# Patient Record
Sex: Female | Born: 1950
Health system: Southern US, Community
[De-identification: ages and names within clinical notes are randomized; demographics above are authoritative.]

## PROBLEM LIST (undated history)

## (undated) DIAGNOSIS — M1990 Unspecified osteoarthritis, unspecified site: Secondary | ICD-10-CM

## (undated) DIAGNOSIS — Z923 Personal history of irradiation: Secondary | ICD-10-CM

## (undated) DIAGNOSIS — F909 Attention-deficit hyperactivity disorder, unspecified type: Secondary | ICD-10-CM

## (undated) DIAGNOSIS — T8859XA Other complications of anesthesia, initial encounter: Secondary | ICD-10-CM

## (undated) DIAGNOSIS — Z85048 Personal history of other malignant neoplasm of rectum, rectosigmoid junction, and anus: Secondary | ICD-10-CM

## (undated) DIAGNOSIS — R0600 Dyspnea, unspecified: Secondary | ICD-10-CM

## (undated) DIAGNOSIS — D649 Anemia, unspecified: Secondary | ICD-10-CM

## (undated) DIAGNOSIS — I10 Essential (primary) hypertension: Secondary | ICD-10-CM

## (undated) DIAGNOSIS — F32A Depression, unspecified: Secondary | ICD-10-CM

## (undated) DIAGNOSIS — C20 Malignant neoplasm of rectum: Secondary | ICD-10-CM

## (undated) DIAGNOSIS — D126 Benign neoplasm of colon, unspecified: Secondary | ICD-10-CM

## (undated) DIAGNOSIS — H269 Unspecified cataract: Secondary | ICD-10-CM

## (undated) DIAGNOSIS — R222 Localized swelling, mass and lump, trunk: Secondary | ICD-10-CM

## (undated) DIAGNOSIS — N3281 Overactive bladder: Secondary | ICD-10-CM

## (undated) DIAGNOSIS — F329 Major depressive disorder, single episode, unspecified: Secondary | ICD-10-CM

## (undated) DIAGNOSIS — F419 Anxiety disorder, unspecified: Secondary | ICD-10-CM

## (undated) DIAGNOSIS — T4145XA Adverse effect of unspecified anesthetic, initial encounter: Secondary | ICD-10-CM

## (undated) DIAGNOSIS — C801 Malignant (primary) neoplasm, unspecified: Secondary | ICD-10-CM

## (undated) DIAGNOSIS — R011 Cardiac murmur, unspecified: Secondary | ICD-10-CM

## (undated) HISTORY — PX: WISDOM TOOTH EXTRACTION: SHX21

## (undated) HISTORY — PX: ABDOMINAL HYSTERECTOMY: SHX81

## (undated) HISTORY — DX: Anemia, unspecified: D64.9

## (undated) HISTORY — PX: RECTAL SURGERY: SHX760

## (undated) HISTORY — DX: Malignant neoplasm of rectum: C20

## (undated) HISTORY — DX: Benign neoplasm of colon, unspecified: D12.6

## (undated) HISTORY — DX: Unspecified cataract: H26.9

## (undated) HISTORY — PX: PORTACATH PLACEMENT: SHX2246

## (undated) HISTORY — DX: Major depressive disorder, single episode, unspecified: F32.9

## (undated) HISTORY — PX: EYE SURGERY: SHX253

## (undated) HISTORY — DX: Personal history of other malignant neoplasm of rectum, rectosigmoid junction, and anus: Z85.048

## (undated) HISTORY — DX: Localized swelling, mass and lump, trunk: R22.2

## (undated) HISTORY — DX: Depression, unspecified: F32.A

## (undated) HISTORY — PX: HAND SURGERY: SHX662

## (undated) HISTORY — DX: Anxiety disorder, unspecified: F41.9

## (undated) HISTORY — DX: Cardiac murmur, unspecified: R01.1

---

## 1978-12-03 HISTORY — PX: HEMORRHOID SURGERY: SHX153

## 1998-09-25 ENCOUNTER — Ambulatory Visit (HOSPITAL_COMMUNITY): Admission: RE | Admit: 1998-09-25 | Discharge: 1998-09-25 | Payer: Self-pay | Admitting: Family Medicine

## 1998-09-25 ENCOUNTER — Encounter: Payer: Self-pay | Admitting: Family Medicine

## 1999-03-29 ENCOUNTER — Other Ambulatory Visit: Admission: RE | Admit: 1999-03-29 | Discharge: 1999-03-29 | Payer: Self-pay | Admitting: Family Medicine

## 2000-10-10 ENCOUNTER — Other Ambulatory Visit: Admission: RE | Admit: 2000-10-10 | Discharge: 2000-10-10 | Payer: Self-pay | Admitting: Family Medicine

## 2001-05-06 ENCOUNTER — Ambulatory Visit (HOSPITAL_BASED_OUTPATIENT_CLINIC_OR_DEPARTMENT_OTHER): Admission: RE | Admit: 2001-05-06 | Discharge: 2001-05-06 | Payer: Self-pay | Admitting: Orthopedic Surgery

## 2001-11-17 ENCOUNTER — Other Ambulatory Visit: Admission: RE | Admit: 2001-11-17 | Discharge: 2001-11-17 | Payer: Self-pay | Admitting: Family Medicine

## 2001-12-02 ENCOUNTER — Encounter: Payer: Self-pay | Admitting: Family Medicine

## 2001-12-02 ENCOUNTER — Ambulatory Visit (HOSPITAL_COMMUNITY): Admission: RE | Admit: 2001-12-02 | Discharge: 2001-12-02 | Payer: Self-pay | Admitting: Family Medicine

## 2002-12-01 ENCOUNTER — Other Ambulatory Visit: Admission: RE | Admit: 2002-12-01 | Discharge: 2002-12-01 | Payer: Self-pay | Admitting: Family Medicine

## 2004-04-12 ENCOUNTER — Other Ambulatory Visit: Admission: RE | Admit: 2004-04-12 | Discharge: 2004-04-12 | Payer: Self-pay | Admitting: Family Medicine

## 2004-04-27 ENCOUNTER — Encounter: Admission: RE | Admit: 2004-04-27 | Discharge: 2004-04-27 | Payer: Self-pay | Admitting: Family Medicine

## 2004-05-16 ENCOUNTER — Ambulatory Visit (HOSPITAL_COMMUNITY): Admission: RE | Admit: 2004-05-16 | Discharge: 2004-05-16 | Payer: Self-pay | Admitting: Internal Medicine

## 2004-07-14 ENCOUNTER — Ambulatory Visit (HOSPITAL_COMMUNITY): Admission: RE | Admit: 2004-07-14 | Discharge: 2004-07-14 | Payer: Self-pay | Admitting: Family Medicine

## 2005-06-11 ENCOUNTER — Other Ambulatory Visit: Admission: RE | Admit: 2005-06-11 | Discharge: 2005-06-11 | Payer: Self-pay | Admitting: Family Medicine

## 2006-02-13 ENCOUNTER — Ambulatory Visit (HOSPITAL_COMMUNITY): Admission: RE | Admit: 2006-02-13 | Discharge: 2006-02-13 | Payer: Self-pay | Admitting: Family Medicine

## 2008-06-30 ENCOUNTER — Other Ambulatory Visit: Admission: RE | Admit: 2008-06-30 | Discharge: 2008-06-30 | Payer: Self-pay | Admitting: Family Medicine

## 2009-11-18 ENCOUNTER — Ambulatory Visit (HOSPITAL_COMMUNITY): Admission: RE | Admit: 2009-11-18 | Discharge: 2009-11-18 | Payer: Self-pay | Admitting: Gastroenterology

## 2009-11-30 ENCOUNTER — Ambulatory Visit: Payer: Self-pay | Admitting: Hematology and Oncology

## 2009-12-08 ENCOUNTER — Ambulatory Visit (HOSPITAL_COMMUNITY): Admission: RE | Admit: 2009-12-08 | Discharge: 2009-12-08 | Payer: Self-pay | Admitting: Hematology and Oncology

## 2009-12-08 LAB — COMPREHENSIVE METABOLIC PANEL
AST: 16 U/L (ref 0–37)
Albumin: 4.4 g/dL (ref 3.5–5.2)
CO2: 27 mEq/L (ref 19–32)
Chloride: 102 mEq/L (ref 96–112)
Creatinine, Ser: 0.97 mg/dL (ref 0.40–1.20)
Potassium: 4.6 mEq/L (ref 3.5–5.3)
Sodium: 141 mEq/L (ref 135–145)

## 2009-12-08 LAB — CBC WITH DIFFERENTIAL/PLATELET
BASO%: 1.5 % (ref 0.0–2.0)
Basophils Absolute: 0.1 10*3/uL (ref 0.0–0.1)
EOS%: 3.8 % (ref 0.0–7.0)
Eosinophils Absolute: 0.2 10*3/uL (ref 0.0–0.5)
HGB: 11.9 g/dL (ref 11.6–15.9)
LYMPH%: 41.7 % (ref 14.0–49.7)
MCHC: 31.7 g/dL (ref 31.5–36.0)
NEUT%: 42.9 % (ref 38.4–76.8)
lymph#: 2.3 10*3/uL (ref 0.9–3.3)

## 2009-12-08 LAB — CEA: CEA: 2.6 ng/mL (ref 0.0–5.0)

## 2009-12-13 ENCOUNTER — Ambulatory Visit: Admission: RE | Admit: 2009-12-13 | Discharge: 2010-02-01 | Payer: Self-pay | Admitting: Radiation Oncology

## 2009-12-14 ENCOUNTER — Ambulatory Visit (HOSPITAL_COMMUNITY): Admission: RE | Admit: 2009-12-14 | Discharge: 2009-12-14 | Payer: Self-pay | Admitting: Hematology and Oncology

## 2009-12-15 ENCOUNTER — Ambulatory Visit (HOSPITAL_COMMUNITY): Admission: RE | Admit: 2009-12-15 | Discharge: 2009-12-15 | Payer: Self-pay | Admitting: Hematology and Oncology

## 2009-12-23 ENCOUNTER — Ambulatory Visit (HOSPITAL_BASED_OUTPATIENT_CLINIC_OR_DEPARTMENT_OTHER): Admission: RE | Admit: 2009-12-23 | Discharge: 2009-12-23 | Payer: Self-pay | Admitting: General Surgery

## 2009-12-26 LAB — CBC WITH DIFFERENTIAL/PLATELET
Eosinophils Absolute: 0.1 10*3/uL (ref 0.0–0.5)
HGB: 11.8 g/dL (ref 11.6–15.9)
LYMPH%: 27.8 % (ref 14.0–49.7)
MCV: 81.4 fL (ref 79.5–101.0)
MONO%: 8.5 % (ref 0.0–14.0)
NEUT#: 3.9 10*3/uL (ref 1.5–6.5)
NEUT%: 61 % (ref 38.4–76.8)
Platelets: 296 10*3/uL (ref 145–400)
WBC: 6.4 10*3/uL (ref 3.9–10.3)

## 2009-12-29 ENCOUNTER — Ambulatory Visit: Payer: Self-pay | Admitting: Hematology and Oncology

## 2010-01-02 LAB — CBC WITH DIFFERENTIAL/PLATELET
Basophils Absolute: 0 10*3/uL (ref 0.0–0.1)
EOS%: 4.5 % (ref 0.0–7.0)
HGB: 12 g/dL (ref 11.6–15.9)
LYMPH%: 31.5 % (ref 14.0–49.7)
MCHC: 32.1 g/dL (ref 31.5–36.0)
MCV: 80.4 fL (ref 79.5–101.0)
MONO%: 12.1 % (ref 0.0–14.0)
NEUT#: 1.8 10*3/uL (ref 1.5–6.5)
NEUT%: 51.1 % (ref 38.4–76.8)
Platelets: 282 10*3/uL (ref 145–400)
RBC: 4.65 10*6/uL (ref 3.70–5.45)
RDW: 15.6 % — ABNORMAL HIGH (ref 11.2–14.5)
WBC: 3.6 10*3/uL — ABNORMAL LOW (ref 3.9–10.3)

## 2010-01-06 LAB — CBC WITH DIFFERENTIAL/PLATELET
EOS%: 3.7 % (ref 0.0–7.0)
MONO#: 0.3 10*3/uL (ref 0.1–0.9)
MONO%: 9.1 % (ref 0.0–14.0)
NEUT#: 2.4 10*3/uL (ref 1.5–6.5)
NEUT%: 67.1 % (ref 38.4–76.8)
RDW: 16.2 % — ABNORMAL HIGH (ref 11.2–14.5)
WBC: 3.6 10*3/uL — ABNORMAL LOW (ref 3.9–10.3)

## 2010-01-06 LAB — COMPREHENSIVE METABOLIC PANEL
ALT: 12 U/L (ref 0–35)
AST: 13 U/L (ref 0–37)
Albumin: 4.3 g/dL (ref 3.5–5.2)
CO2: 24 mEq/L (ref 19–32)
Calcium: 9.5 mg/dL (ref 8.4–10.5)
Sodium: 138 mEq/L (ref 135–145)
Total Protein: 7.4 g/dL (ref 6.0–8.3)

## 2010-01-06 LAB — LACTATE DEHYDROGENASE: LDH: 159 U/L (ref 94–250)

## 2010-01-08 ENCOUNTER — Emergency Department (HOSPITAL_COMMUNITY): Admission: EM | Admit: 2010-01-08 | Discharge: 2010-01-08 | Payer: Self-pay | Admitting: Emergency Medicine

## 2010-01-09 LAB — CBC WITH DIFFERENTIAL/PLATELET
BASO%: 0.2 % (ref 0.0–2.0)
EOS%: 5 % (ref 0.0–7.0)
LYMPH%: 18.2 % (ref 14.0–49.7)
MCH: 27.2 pg (ref 25.1–34.0)
MCHC: 34 g/dL (ref 31.5–36.0)
MONO#: 0.3 10*3/uL (ref 0.1–0.9)
NEUT%: 69 % (ref 38.4–76.8)
Platelets: 230 10*3/uL (ref 145–400)
RBC: 4.17 10*6/uL (ref 3.70–5.45)
WBC: 3.4 10*3/uL — ABNORMAL LOW (ref 3.9–10.3)
lymph#: 0.6 10*3/uL — ABNORMAL LOW (ref 0.9–3.3)

## 2010-01-16 LAB — CBC WITH DIFFERENTIAL/PLATELET
BASO%: 0.5 % (ref 0.0–2.0)
EOS%: 6.1 % (ref 0.0–7.0)
HCT: 35.2 % (ref 34.8–46.6)
MCH: 26.1 pg (ref 25.1–34.0)
MCHC: 32.4 g/dL (ref 31.5–36.0)
MONO#: 0.4 10*3/uL (ref 0.1–0.9)
NEUT%: 64.9 % (ref 38.4–76.8)
RDW: 16.3 % — ABNORMAL HIGH (ref 11.2–14.5)
WBC: 3.8 10*3/uL — ABNORMAL LOW (ref 3.9–10.3)
lymph#: 0.7 10*3/uL — ABNORMAL LOW (ref 0.9–3.3)
nRBC: 0 % (ref 0–0)

## 2010-01-23 LAB — CBC WITH DIFFERENTIAL/PLATELET
Basophils Absolute: 0 10*3/uL (ref 0.0–0.1)
Eosinophils Absolute: 0.3 10*3/uL (ref 0.0–0.5)
HGB: 11.1 g/dL — ABNORMAL LOW (ref 11.6–15.9)
MCV: 80.2 fL (ref 79.5–101.0)
MONO#: 0.4 10*3/uL (ref 0.1–0.9)
MONO%: 12.8 % (ref 0.0–14.0)
NEUT#: 2 10*3/uL (ref 1.5–6.5)
RBC: 4.12 10*6/uL (ref 3.70–5.45)
RDW: 17.1 % — ABNORMAL HIGH (ref 11.2–14.5)
WBC: 3.2 10*3/uL — ABNORMAL LOW (ref 3.9–10.3)
lymph#: 0.4 10*3/uL — ABNORMAL LOW (ref 0.9–3.3)

## 2010-01-26 ENCOUNTER — Ambulatory Visit: Payer: Self-pay | Admitting: Hematology and Oncology

## 2010-01-30 LAB — CBC WITH DIFFERENTIAL/PLATELET
Basophils Absolute: 0.1 10*3/uL (ref 0.0–0.1)
Eosinophils Absolute: 0.3 10*3/uL (ref 0.0–0.5)
HGB: 10.9 g/dL — ABNORMAL LOW (ref 11.6–15.9)
LYMPH%: 14.1 % (ref 14.0–49.7)
MCV: 80.9 fL (ref 79.5–101.0)
MONO#: 0.4 10*3/uL (ref 0.1–0.9)
MONO%: 9.8 % (ref 0.0–14.0)
NEUT#: 3 10*3/uL (ref 1.5–6.5)
Platelets: 256 10*3/uL (ref 145–400)
RBC: 4.19 10*6/uL (ref 3.70–5.45)
WBC: 4.4 10*3/uL (ref 3.9–10.3)
nRBC: 0 % (ref 0–0)

## 2010-02-22 ENCOUNTER — Ambulatory Visit: Payer: Self-pay | Admitting: Hematology and Oncology

## 2010-02-24 ENCOUNTER — Ambulatory Visit (HOSPITAL_COMMUNITY): Admission: RE | Admit: 2010-02-24 | Discharge: 2010-02-24 | Payer: Self-pay | Admitting: Hematology and Oncology

## 2010-02-24 LAB — COMPREHENSIVE METABOLIC PANEL
ALT: 45 U/L — ABNORMAL HIGH (ref 0–35)
Albumin: 4.1 g/dL (ref 3.5–5.2)
Alkaline Phosphatase: 77 U/L (ref 39–117)
CO2: 29 mEq/L (ref 19–32)
Calcium: 9.6 mg/dL (ref 8.4–10.5)
Chloride: 103 mEq/L (ref 96–112)
Glucose, Bld: 105 mg/dL — ABNORMAL HIGH (ref 70–99)
Sodium: 140 mEq/L (ref 135–145)
Total Bilirubin: 0.5 mg/dL (ref 0.3–1.2)
Total Protein: 7.7 g/dL (ref 6.0–8.3)

## 2010-02-24 LAB — CBC WITH DIFFERENTIAL/PLATELET
BASO%: 0.5 % (ref 0.0–2.0)
Basophils Absolute: 0 10*3/uL (ref 0.0–0.1)
EOS%: 5.6 % (ref 0.0–7.0)
HCT: 33.8 % — ABNORMAL LOW (ref 34.8–46.6)
LYMPH%: 17.8 % (ref 14.0–49.7)
MCHC: 33.6 g/dL (ref 31.5–36.0)
MONO#: 0.5 10*3/uL (ref 0.1–0.9)
MONO%: 15.7 % — ABNORMAL HIGH (ref 0.0–14.0)
NEUT%: 60.4 % (ref 38.4–76.8)

## 2010-02-24 LAB — LACTATE DEHYDROGENASE: LDH: 167 U/L (ref 94–250)

## 2010-02-24 LAB — CEA: CEA: 1.3 ng/mL (ref 0.0–5.0)

## 2010-04-04 ENCOUNTER — Ambulatory Visit (HOSPITAL_COMMUNITY): Admission: RE | Admit: 2010-04-04 | Discharge: 2010-04-04 | Payer: Self-pay | Admitting: Hematology and Oncology

## 2010-04-24 ENCOUNTER — Encounter (INDEPENDENT_AMBULATORY_CARE_PROVIDER_SITE_OTHER): Payer: Self-pay | Admitting: General Surgery

## 2010-04-24 ENCOUNTER — Inpatient Hospital Stay (HOSPITAL_COMMUNITY): Admission: RE | Admit: 2010-04-24 | Discharge: 2010-04-29 | Payer: Self-pay | Admitting: General Surgery

## 2010-04-24 HISTORY — PX: COLON SURGERY: SHX602

## 2010-05-12 ENCOUNTER — Ambulatory Visit: Payer: Self-pay | Admitting: Hematology and Oncology

## 2010-06-28 ENCOUNTER — Ambulatory Visit: Payer: Self-pay | Admitting: Hematology and Oncology

## 2010-08-08 ENCOUNTER — Ambulatory Visit: Payer: Self-pay | Admitting: Hematology and Oncology

## 2010-08-30 LAB — COMPREHENSIVE METABOLIC PANEL
ALT: 11 U/L (ref 0–35)
Albumin: 4.1 g/dL (ref 3.5–5.2)
BUN: 12 mg/dL (ref 6–23)
Calcium: 9.5 mg/dL (ref 8.4–10.5)
Creatinine, Ser: 1.03 mg/dL (ref 0.40–1.20)
Glucose, Bld: 175 mg/dL — ABNORMAL HIGH (ref 70–99)
Sodium: 140 mEq/L (ref 135–145)
Total Bilirubin: 0.2 mg/dL — ABNORMAL LOW (ref 0.3–1.2)
Total Protein: 7.1 g/dL (ref 6.0–8.3)

## 2010-08-30 LAB — LACTATE DEHYDROGENASE: LDH: 110 U/L (ref 94–250)

## 2010-08-30 LAB — CBC WITH DIFFERENTIAL/PLATELET
BASO%: 0.2 % (ref 0.0–2.0)
Basophils Absolute: 0 10*3/uL (ref 0.0–0.1)
EOS%: 2.2 % (ref 0.0–7.0)
HCT: 32.7 % — ABNORMAL LOW (ref 34.8–46.6)
MCH: 26.1 pg (ref 25.1–34.0)
MCHC: 33.8 g/dL (ref 31.5–36.0)
MCV: 77.3 fL — ABNORMAL LOW (ref 79.5–101.0)
MONO%: 8.8 % (ref 0.0–14.0)
Platelets: 302 10*3/uL (ref 145–400)
RBC: 4.23 10*6/uL (ref 3.70–5.45)
lymph#: 0.7 10*3/uL — ABNORMAL LOW (ref 0.9–3.3)

## 2010-09-04 ENCOUNTER — Ambulatory Visit (HOSPITAL_COMMUNITY): Admission: RE | Admit: 2010-09-04 | Discharge: 2010-09-04 | Payer: Self-pay | Admitting: Hematology and Oncology

## 2010-09-20 ENCOUNTER — Ambulatory Visit: Payer: Self-pay | Admitting: Hematology and Oncology

## 2010-09-22 LAB — CBC WITH DIFFERENTIAL/PLATELET
BASO%: 1.4 % (ref 0.0–2.0)
Basophils Absolute: 0 10*3/uL (ref 0.0–0.1)
EOS%: 2.9 % (ref 0.0–7.0)
Eosinophils Absolute: 0.1 10*3/uL (ref 0.0–0.5)
HCT: 33.7 % — ABNORMAL LOW (ref 34.8–46.6)
HGB: 11.1 g/dL — ABNORMAL LOW (ref 11.6–15.9)
LYMPH%: 22.4 % (ref 14.0–49.7)
MCH: 25.8 pg (ref 25.1–34.0)
MCHC: 33 g/dL (ref 31.5–36.0)
MCV: 78.2 fL — ABNORMAL LOW (ref 79.5–101.0)
MONO#: 0.3 10*3/uL (ref 0.1–0.9)
MONO%: 10.7 % (ref 0.0–14.0)
NEUT#: 1.9 10*3/uL (ref 1.5–6.5)
NEUT%: 62.6 % (ref 38.4–76.8)
Platelets: 321 10*3/uL (ref 145–400)
RBC: 4.31 10*6/uL (ref 3.70–5.45)
RDW: 20 % — ABNORMAL HIGH (ref 11.2–14.5)
WBC: 3.1 10*3/uL — ABNORMAL LOW (ref 3.9–10.3)
lymph#: 0.7 10*3/uL — ABNORMAL LOW (ref 0.9–3.3)

## 2010-09-22 LAB — COMPREHENSIVE METABOLIC PANEL
ALT: 10 U/L (ref 0–35)
AST: 10 U/L (ref 0–37)
Albumin: 4.2 g/dL (ref 3.5–5.2)
Alkaline Phosphatase: 88 U/L (ref 39–117)
BUN: 16 mg/dL (ref 6–23)
CO2: 27 mEq/L (ref 19–32)
Calcium: 9.6 mg/dL (ref 8.4–10.5)
Chloride: 104 mEq/L (ref 96–112)
Creatinine, Ser: 1.11 mg/dL (ref 0.40–1.20)
Glucose, Bld: 144 mg/dL — ABNORMAL HIGH (ref 70–99)
Potassium: 3.8 mEq/L (ref 3.5–5.3)
Sodium: 142 mEq/L (ref 135–145)
Total Bilirubin: 0.3 mg/dL (ref 0.3–1.2)
Total Protein: 7.1 g/dL (ref 6.0–8.3)

## 2010-10-19 LAB — COMPREHENSIVE METABOLIC PANEL
BUN: 22 mg/dL (ref 6–23)
CO2: 26 mEq/L (ref 19–32)
Creatinine, Ser: 1.16 mg/dL (ref 0.40–1.20)
Glucose, Bld: 138 mg/dL — ABNORMAL HIGH (ref 70–99)
Total Bilirubin: 0.2 mg/dL — ABNORMAL LOW (ref 0.3–1.2)

## 2010-10-19 LAB — CBC WITH DIFFERENTIAL/PLATELET
Eosinophils Absolute: 0.1 10*3/uL (ref 0.0–0.5)
HCT: 33.9 % — ABNORMAL LOW (ref 34.8–46.6)
LYMPH%: 22.7 % (ref 14.0–49.7)
MCHC: 32.9 g/dL (ref 31.5–36.0)
MONO#: 0.3 10*3/uL (ref 0.1–0.9)
NEUT#: 2.6 10*3/uL (ref 1.5–6.5)
NEUT%: 66.2 % (ref 38.4–76.8)
Platelets: 333 10*3/uL (ref 145–400)
WBC: 4 10*3/uL (ref 3.9–10.3)

## 2010-10-23 ENCOUNTER — Ambulatory Visit: Payer: Self-pay | Admitting: Hematology and Oncology

## 2010-10-25 LAB — CBC WITH DIFFERENTIAL/PLATELET
Eosinophils Absolute: 0.1 10*3/uL (ref 0.0–0.5)
HCT: 31.8 % — ABNORMAL LOW (ref 34.8–46.6)
LYMPH%: 23.3 % (ref 14.0–49.7)
MONO#: 0.3 10*3/uL (ref 0.1–0.9)
NEUT#: 1.8 10*3/uL (ref 1.5–6.5)
NEUT%: 61.8 % (ref 38.4–76.8)
Platelets: 282 10*3/uL (ref 145–400)
WBC: 2.9 10*3/uL — ABNORMAL LOW (ref 3.9–10.3)

## 2010-11-14 LAB — CBC WITH DIFFERENTIAL/PLATELET
Basophils Absolute: 0 10*3/uL (ref 0.0–0.1)
HCT: 34.8 % (ref 34.8–46.6)
HGB: 11.7 g/dL (ref 11.6–15.9)
LYMPH%: 22.3 % (ref 14.0–49.7)
MCH: 26.6 pg (ref 25.1–34.0)
MONO#: 0.3 10*3/uL (ref 0.1–0.9)
NEUT%: 66.2 % (ref 38.4–76.8)
Platelets: 339 10*3/uL (ref 145–400)
WBC: 3.9 10*3/uL (ref 3.9–10.3)
lymph#: 0.9 10*3/uL (ref 0.9–3.3)

## 2010-11-14 LAB — COMPREHENSIVE METABOLIC PANEL
BUN: 14 mg/dL (ref 6–23)
CO2: 25 mEq/L (ref 19–32)
Calcium: 9.7 mg/dL (ref 8.4–10.5)
Chloride: 103 mEq/L (ref 96–112)
Creatinine, Ser: 1.07 mg/dL (ref 0.40–1.20)
Total Bilirubin: 0.3 mg/dL (ref 0.3–1.2)

## 2010-11-22 ENCOUNTER — Ambulatory Visit: Payer: Self-pay | Admitting: Hematology and Oncology

## 2010-11-23 LAB — CBC WITH DIFFERENTIAL/PLATELET
BASO%: 1.9 % (ref 0.0–2.0)
Basophils Absolute: 0.1 10*3/uL (ref 0.0–0.1)
Eosinophils Absolute: 0.1 10*3/uL (ref 0.0–0.5)
HCT: 34.4 % — ABNORMAL LOW (ref 34.8–46.6)
HGB: 11 g/dL — ABNORMAL LOW (ref 11.6–15.9)
MONO#: 0.3 10*3/uL (ref 0.1–0.9)
NEUT#: 1.2 10*3/uL — ABNORMAL LOW (ref 1.5–6.5)
NEUT%: 46.5 % (ref 38.4–76.8)
WBC: 2.6 10*3/uL — ABNORMAL LOW (ref 3.9–10.3)
lymph#: 0.9 10*3/uL (ref 0.9–3.3)

## 2010-11-28 LAB — CBC WITH DIFFERENTIAL/PLATELET
Basophils Absolute: 0 10*3/uL (ref 0.0–0.1)
Eosinophils Absolute: 0.1 10*3/uL (ref 0.0–0.5)
HCT: 36 % (ref 34.8–46.6)
HGB: 11.3 g/dL — ABNORMAL LOW (ref 11.6–15.9)
MCV: 80.9 fL (ref 79.5–101.0)
MONO%: 8.2 % (ref 0.0–14.0)
NEUT#: 2.6 10*3/uL (ref 1.5–6.5)
RDW: 17.4 % — ABNORMAL HIGH (ref 11.2–14.5)

## 2010-12-19 LAB — COMPREHENSIVE METABOLIC PANEL
ALT: 13 U/L (ref 0–35)
AST: 13 U/L (ref 0–37)
Albumin: 4.3 g/dL (ref 3.5–5.2)
Alkaline Phosphatase: 68 U/L (ref 39–117)
BUN: 11 mg/dL (ref 6–23)
CO2: 28 mEq/L (ref 19–32)
Calcium: 9.5 mg/dL (ref 8.4–10.5)
Chloride: 104 mEq/L (ref 96–112)
Creatinine, Ser: 0.99 mg/dL (ref 0.40–1.20)
Glucose, Bld: 131 mg/dL — ABNORMAL HIGH (ref 70–99)
Potassium: 4 mEq/L (ref 3.5–5.3)
Sodium: 141 mEq/L (ref 135–145)
Total Bilirubin: 0.3 mg/dL (ref 0.3–1.2)
Total Protein: 6.9 g/dL (ref 6.0–8.3)

## 2010-12-19 LAB — CBC WITH DIFFERENTIAL/PLATELET
BASO%: 0.6 % (ref 0.0–2.0)
Basophils Absolute: 0 10*3/uL (ref 0.0–0.1)
EOS%: 3 % (ref 0.0–7.0)
Eosinophils Absolute: 0.1 10*3/uL (ref 0.0–0.5)
HCT: 33.3 % — ABNORMAL LOW (ref 34.8–46.6)
HGB: 10.9 g/dL — ABNORMAL LOW (ref 11.6–15.9)
LYMPH%: 31.3 % (ref 14.0–49.7)
MCH: 26.3 pg (ref 25.1–34.0)
MCHC: 32.8 g/dL (ref 31.5–36.0)
MCV: 80.3 fL (ref 79.5–101.0)
MONO#: 0.3 10*3/uL (ref 0.1–0.9)
MONO%: 10.6 % (ref 0.0–14.0)
NEUT#: 1.5 10*3/uL (ref 1.5–6.5)
NEUT%: 54.5 % (ref 38.4–76.8)
Platelets: 267 10*3/uL (ref 145–400)
RBC: 4.15 10*6/uL (ref 3.70–5.45)
RDW: 18.7 % — ABNORMAL HIGH (ref 11.2–14.5)
WBC: 2.8 10*3/uL — ABNORMAL LOW (ref 3.9–10.3)
lymph#: 0.9 10*3/uL (ref 0.9–3.3)

## 2010-12-22 ENCOUNTER — Other Ambulatory Visit: Payer: Self-pay | Admitting: Hematology and Oncology

## 2010-12-22 DIAGNOSIS — C20 Malignant neoplasm of rectum: Secondary | ICD-10-CM

## 2010-12-24 ENCOUNTER — Encounter: Payer: Self-pay | Admitting: Family Medicine

## 2010-12-25 ENCOUNTER — Ambulatory Visit: Payer: Self-pay | Admitting: Hematology and Oncology

## 2011-01-23 ENCOUNTER — Other Ambulatory Visit (HOSPITAL_COMMUNITY): Payer: Self-pay

## 2011-01-23 ENCOUNTER — Encounter (HOSPITAL_BASED_OUTPATIENT_CLINIC_OR_DEPARTMENT_OTHER): Payer: PRIVATE HEALTH INSURANCE | Admitting: Hematology and Oncology

## 2011-01-23 ENCOUNTER — Other Ambulatory Visit: Payer: Self-pay | Admitting: Hematology and Oncology

## 2011-01-23 ENCOUNTER — Ambulatory Visit (HOSPITAL_COMMUNITY)
Admission: RE | Admit: 2011-01-23 | Discharge: 2011-01-23 | Disposition: A | Discharge status: Home / Self Care | Payer: PRIVATE HEALTH INSURANCE | Source: Ambulatory Visit | Attending: Hematology and Oncology | Admitting: Hematology and Oncology

## 2011-01-23 DIAGNOSIS — K7689 Other specified diseases of liver: Secondary | ICD-10-CM | POA: Insufficient documentation

## 2011-01-23 DIAGNOSIS — C20 Malignant neoplasm of rectum: Secondary | ICD-10-CM | POA: Insufficient documentation

## 2011-01-23 DIAGNOSIS — J984 Other disorders of lung: Secondary | ICD-10-CM | POA: Insufficient documentation

## 2011-01-23 DIAGNOSIS — Z5111 Encounter for antineoplastic chemotherapy: Secondary | ICD-10-CM

## 2011-01-23 DIAGNOSIS — Z483 Aftercare following surgery for neoplasm: Secondary | ICD-10-CM | POA: Insufficient documentation

## 2011-01-23 LAB — CMP (CANCER CENTER ONLY)
Alkaline Phosphatase: 74 U/L (ref 26–84)
BUN, Bld: 10 mg/dL (ref 7–22)
Calcium: 9.1 mg/dL (ref 8.0–10.3)
Creat: 0.9 mg/dl (ref 0.6–1.2)
Sodium: 142 mEq/L (ref 128–145)
Total Protein: 6.8 g/dL (ref 6.4–8.1)

## 2011-01-23 LAB — CBC WITH DIFFERENTIAL/PLATELET
BASO%: 0.6 % (ref 0.0–2.0)
Eosinophils Absolute: 0.2 10*3/uL (ref 0.0–0.5)
HGB: 11.3 g/dL — ABNORMAL LOW (ref 11.6–15.9)
LYMPH%: 17.3 % (ref 14.0–49.7)
MCHC: 32.8 g/dL (ref 31.5–36.0)
MONO#: 0.6 10*3/uL (ref 0.1–0.9)
MONO%: 13.3 % (ref 0.0–14.0)
NEUT#: 3.1 10*3/uL (ref 1.5–6.5)
RBC: 4.23 10*6/uL (ref 3.70–5.45)
RDW: 19 % — ABNORMAL HIGH (ref 11.2–14.5)
lymph#: 0.8 10*3/uL — ABNORMAL LOW (ref 0.9–3.3)

## 2011-01-23 LAB — CEA: CEA: 1.2 ng/mL (ref 0.0–5.0)

## 2011-01-23 MED ORDER — IOHEXOL 300 MG/ML  SOLN
100.0000 mL | Freq: Once | INTRAMUSCULAR | Status: AC | PRN
  Administered 2011-01-23: 100 mL via INTRAVENOUS

## 2011-01-30 ENCOUNTER — Encounter (HOSPITAL_BASED_OUTPATIENT_CLINIC_OR_DEPARTMENT_OTHER): Payer: PRIVATE HEALTH INSURANCE | Admitting: Hematology and Oncology

## 2011-01-30 ENCOUNTER — Other Ambulatory Visit: Payer: Self-pay | Admitting: Hematology and Oncology

## 2011-01-30 DIAGNOSIS — C20 Malignant neoplasm of rectum: Secondary | ICD-10-CM

## 2011-02-18 LAB — BASIC METABOLIC PANEL
CO2: 30 mEq/L (ref 19–32)
Chloride: 99 mEq/L (ref 96–112)
Glucose, Bld: 90 mg/dL (ref 70–99)
Potassium: 3.8 mEq/L (ref 3.5–5.1)
Sodium: 138 mEq/L (ref 135–145)

## 2011-02-19 LAB — COMPREHENSIVE METABOLIC PANEL
AST: 19 U/L (ref 0–37)
Alkaline Phosphatase: 83 U/L (ref 39–117)
BUN: 9 mg/dL (ref 6–23)
CO2: 29 mEq/L (ref 19–32)
Chloride: 106 mEq/L (ref 96–112)
Creatinine, Ser: 0.92 mg/dL (ref 0.4–1.2)
GFR calc non Af Amer: >60 mL/min (ref >60)
Potassium: 4.4 mEq/L (ref 3.5–5.1)
Total Bilirubin: 0.5 mg/dL (ref 0.3–1.2)

## 2011-02-19 LAB — CROSSMATCH: Antibody Screen: NEGATIVE

## 2011-02-19 LAB — CBC
HCT: 28.8 % — ABNORMAL LOW (ref 36.0–46.0)
Hemoglobin: 11.3 g/dL — ABNORMAL LOW (ref 12.0–15.0)
Hemoglobin: 9.7 g/dL — ABNORMAL LOW (ref 12.0–15.0)
MCHC: 33.7 g/dL (ref 30.0–36.0)
MCHC: 33.9 g/dL (ref 30.0–36.0)
MCV: 82 fL (ref 78.0–100.0)
MCV: 82.8 fL (ref 78.0–100.0)
Platelets: 300 10*3/uL (ref 150–400)
RBC: 4.05 MIL/uL (ref 3.87–5.11)
RDW: 17.4 % — ABNORMAL HIGH (ref 11.5–15.5)
WBC: 3.8 10*3/uL — ABNORMAL LOW (ref 4.0–10.5)

## 2011-02-19 LAB — GLUCOSE, CAPILLARY
Glucose-Capillary: 117 mg/dL — ABNORMAL HIGH (ref 70–99)
Glucose-Capillary: 130 mg/dL — ABNORMAL HIGH (ref 70–99)
Glucose-Capillary: 131 mg/dL — ABNORMAL HIGH (ref 70–99)
Glucose-Capillary: 137 mg/dL — ABNORMAL HIGH (ref 70–99)
Glucose-Capillary: 137 mg/dL — ABNORMAL HIGH (ref 70–99)
Glucose-Capillary: 148 mg/dL — ABNORMAL HIGH (ref 70–99)
Glucose-Capillary: 150 mg/dL — ABNORMAL HIGH (ref 70–99)
Glucose-Capillary: 155 mg/dL — ABNORMAL HIGH (ref 70–99)
Glucose-Capillary: 167 mg/dL — ABNORMAL HIGH (ref 70–99)
Glucose-Capillary: 171 mg/dL — ABNORMAL HIGH (ref 70–99)
Glucose-Capillary: 189 mg/dL — ABNORMAL HIGH (ref 70–99)
Glucose-Capillary: 199 mg/dL — ABNORMAL HIGH (ref 70–99)
Glucose-Capillary: 258 mg/dL — ABNORMAL HIGH (ref 70–99)
Glucose-Capillary: 97 mg/dL (ref 70–99)

## 2011-02-19 LAB — DIFFERENTIAL
Basophils Absolute: 0 10*3/uL (ref 0.0–0.1)
Basophils Relative: 0 % (ref 0–1)
Eosinophils Relative: 3 % (ref 0–5)
Lymphocytes Relative: 17 % (ref 12–46)
Monocytes Absolute: 0.4 10*3/uL (ref 0.1–1.0)
Neutro Abs: 2.6 10*3/uL (ref 1.7–7.7)

## 2011-02-19 LAB — BASIC METABOLIC PANEL
BUN: 9 mg/dL (ref 6–23)
CO2: 30 mEq/L (ref 19–32)
GFR calc non Af Amer: 50 mL/min — ABNORMAL LOW (ref >60)
Glucose, Bld: 180 mg/dL — ABNORMAL HIGH (ref 70–99)
Potassium: 3.1 mEq/L — ABNORMAL LOW (ref 3.5–5.1)

## 2011-02-19 LAB — PROTIME-INR: Prothrombin Time: 12.7 seconds (ref 11.6–15.2)

## 2011-03-05 LAB — CREATININE, SERUM
Creatinine, Ser: 0.95 mg/dL (ref 0.4–1.2)
GFR calc non Af Amer: >60 mL/min (ref >60)

## 2011-03-05 LAB — GLUCOSE, CAPILLARY: Glucose-Capillary: 126 mg/dL — ABNORMAL HIGH (ref 70–99)

## 2011-03-05 LAB — CEA: CEA: 2.4 ng/mL (ref 0.0–5.0)

## 2011-03-13 ENCOUNTER — Encounter (HOSPITAL_BASED_OUTPATIENT_CLINIC_OR_DEPARTMENT_OTHER): Payer: PRIVATE HEALTH INSURANCE | Admitting: Hematology and Oncology

## 2011-03-13 DIAGNOSIS — Z452 Encounter for adjustment and management of vascular access device: Secondary | ICD-10-CM

## 2011-03-13 DIAGNOSIS — C20 Malignant neoplasm of rectum: Secondary | ICD-10-CM

## 2011-04-20 NOTE — Op Note (Signed)
[facility]  Patient:    Tonya Vincent, Tonya Vincent                      MRN: [id number] Proc. Date: 05/06/01 Adm. Date:  [id number] Attending:  Susa Day                           Operative Report  PREOPERATIVE DIAGNOSIS:  Entrapment neuropathy, median nerve, left carpal tunnel.  POSTOPERATIVE DIAGNOSIS:  Entrapment neuropathy, median nerve, left carpal tunnel.  OPERATION PERFORMED:  Release of left transverse carpal ligament.  OPERATING SURGEON:  Josephine Igo, M.D.  ASSISTANT:  Annye Rusk, P.A-C.  ANESTHESIA:  General by LMA.  ANESTHESIOLOGIST:  Dr. Gelene Mink.  INDICATIONS:  The patient is a 60 year old woman who has had chronic numbness and pain in her left hand.  Clinical exam suggested carpal tunnel syndrome. Electrodiagnostic studies confirmed median neuropathy at the wrist level.  Due to failure of nonoperative measures, the patient is brought to the operating room at this time for release of her left transverse carpal ligament.  DESCRIPTION OF PROCEDURE:  [name] was brought to the operating room and placed in supine position on the operating table.  Following induction of general anesthesia by LMA, the left arm was prepped with Betadine soap and solution and sterilely draped.  Following exsanguination of the limb with an Esmarch bandage, the arterial tourniquet on the proximal  brachium was inflated to 220 mmHg.  The procedure commenced with a short incision in the line of the ring finger and the palm.  The subcutaneous tissues were carefully divided revealing the palmar fascia.  This was split in line with its fibers to reveal the common sensory branches of the median nerve.  These were followed back to the transverse carpal ligament where the nerve was carefully isolated from the ligament on its ulnar border.  The ligament was then released with scissors extending into the distal forearm.  This widely  opened the carpal canal.  No masses or other predicaments were noted.  Bleeding points along the margin of the released ligament were electrocauterized with bipolar current followed by repair of the skin with intradermal 3-0 Prolene suture.  A compressive dressing was applied with a volar plaster splint maintaining the wrist in 5 degrees dorsiflexion.  For aftercare the patient is given a prescription for Percocet 5 mg 1 or 2 tablets p.o. q.4-6h. p.r.n. pain.  She will return to our office for interval follow-up in approximately 7 to 10 days for suture removal. DD:  05/06/01 TD:  05/06/01 Job: [id number]

## 2011-04-20 NOTE — Op Note (Signed)
NAME:  Tonya Vincent, Tonya Vincent                       ACCOUNT NO.:  [id number]   MEDICAL RECORD NO.:  [id number]                   PATIENT TYPE:  AMB   LOCATION:  DAY                                  FACILITY:  [facility]   PHYSICIAN:  R. Roetta Sessions, M.D.              DATE OF BIRTH:  [birth year]   DATE OF PROCEDURE:  05/16/2004  DATE OF DISCHARGE:                                 OPERATIVE REPORT   PROCEDURE:  Screening colonoscopy.   ENDOSCOPIST:  Gerrit Friends. Rourk, M.D.   INDICATIONS FOR PROCEDURE:  The patient is a 60 year old lady devoid of any  GI tract symptoms and is referred for colonoscopy.  There is no family  history of colorectal neoplasia.  She has never had her colon imaged  previously.  Colonoscopy is now being done as a standard screening maneuver.  This approach has been discussed with the patient at length.  The potential  risks, benefits, and alternatives have been reviewed; questions answered.  She is agreeable.  Please see my handwritten H&P.   PROCEDURE NOTE:  O2 saturation, blood pressure, pulse and respirations were  monitored throughout the entire procedure.  Conscious sedation: Versed and Demerol in incremental doses.   INSTRUMENT:  Olympus video chip system.   FINDINGS:  Digital rectal exam revealed no abnormalities.   ENDOSCOPIC FINDINGS:  The prep was good.   RECTUM:  Examination of the rectal mucosa including the retroflex view of  the anal verge revealed no abnormalities.   COLON:  The colonic mucosa was surveyed from the rectosigmoid junction  through the left transverse and right colon to the area of the appendiceal  orifice, ileocecal valve, and cecum.  These structures were well seen and  photographed for the record.   From this level the scope was slowly withdrawn.  All previously mentioned  mucosal surfaces were again seen.  The patient was noted to have left-sided  diverticula.  The remainder of the colonic mucosa appeared normal.  The  patient  tolerated the procedure well and was reacted in endoscopy.   IMPRESSION:  1. Normal rectum.  2. Sigmoid diverticula.  3. The remainder of the colonic mucosa appeared normal.   RECOMMENDATIONS:  1. Diverticulosis literature provided to Ms. Sweatman.  2. Recommend repeat colonoscopy in 10 years.      ___________________________________________                                            Jonathon Bellows, M.D.   RMR/MEDQ  D:  05/16/2004  T:  05/16/2004  Job:  [id number]   cc:   R. Roetta Sessions, M.D.  [address]  [place]  [place] [address]  Fax: [phone]

## 2011-04-24 ENCOUNTER — Ambulatory Visit (HOSPITAL_COMMUNITY)
Admission: RE | Admit: 2011-04-24 | Discharge: 2011-04-24 | Disposition: A | Discharge status: Home / Self Care | Payer: PRIVATE HEALTH INSURANCE | Source: Ambulatory Visit | Attending: Hematology and Oncology | Admitting: Hematology and Oncology

## 2011-04-24 ENCOUNTER — Encounter (HOSPITAL_COMMUNITY): Payer: Self-pay

## 2011-04-24 ENCOUNTER — Encounter (HOSPITAL_BASED_OUTPATIENT_CLINIC_OR_DEPARTMENT_OTHER): Payer: PRIVATE HEALTH INSURANCE | Admitting: Hematology and Oncology

## 2011-04-24 ENCOUNTER — Other Ambulatory Visit: Payer: Self-pay | Admitting: Hematology and Oncology

## 2011-04-24 DIAGNOSIS — C20 Malignant neoplasm of rectum: Secondary | ICD-10-CM

## 2011-04-24 DIAGNOSIS — Z452 Encounter for adjustment and management of vascular access device: Secondary | ICD-10-CM

## 2011-04-24 DIAGNOSIS — J984 Other disorders of lung: Secondary | ICD-10-CM | POA: Insufficient documentation

## 2011-04-24 DIAGNOSIS — R222 Localized swelling, mass and lump, trunk: Secondary | ICD-10-CM | POA: Insufficient documentation

## 2011-04-24 HISTORY — DX: Essential (primary) hypertension: I10

## 2011-04-24 HISTORY — DX: Malignant (primary) neoplasm, unspecified: C80.1

## 2011-04-24 LAB — CBC WITH DIFFERENTIAL/PLATELET
Basophils Absolute: 0 10*3/uL (ref 0.0–0.1)
Eosinophils Absolute: 0.1 10*3/uL (ref 0.0–0.5)
HGB: 10.9 g/dL — ABNORMAL LOW (ref 11.6–15.9)
MONO#: 0.4 10*3/uL (ref 0.1–0.9)
MONO%: 10.5 % (ref 0.0–14.0)
NEUT#: 2.6 10*3/uL (ref 1.5–6.5)
RBC: 4.34 10*6/uL (ref 3.70–5.45)
RDW: 16.4 % — ABNORMAL HIGH (ref 11.2–14.5)
WBC: 4.2 10*3/uL (ref 3.9–10.3)
lymph#: 1 10*3/uL (ref 0.9–3.3)

## 2011-04-24 LAB — CMP (CANCER CENTER ONLY)
Albumin: 4 g/dL (ref 3.3–5.5)
Alkaline Phosphatase: 83 U/L (ref 26–84)
BUN, Bld: 13 mg/dL (ref 7–22)
Calcium: 9.4 mg/dL (ref 8.0–10.3)
Chloride: 98 mEq/L (ref 98–108)
Glucose, Bld: 143 mg/dL — ABNORMAL HIGH (ref 73–118)
Potassium: 4.1 mEq/L (ref 3.3–4.7)
Sodium: 148 mEq/L — ABNORMAL HIGH (ref 128–145)
Total Protein: 7.6 g/dL (ref 6.4–8.1)

## 2011-04-24 MED ORDER — IOHEXOL 300 MG/ML  SOLN
100.0000 mL | Freq: Once | INTRAMUSCULAR | Status: AC | PRN
  Administered 2011-04-24: 100 mL via INTRAVENOUS

## 2011-04-26 ENCOUNTER — Encounter (HOSPITAL_BASED_OUTPATIENT_CLINIC_OR_DEPARTMENT_OTHER): Payer: PRIVATE HEALTH INSURANCE | Admitting: Hematology and Oncology

## 2011-04-26 DIAGNOSIS — C20 Malignant neoplasm of rectum: Secondary | ICD-10-CM

## 2011-04-26 DIAGNOSIS — R918 Other nonspecific abnormal finding of lung field: Secondary | ICD-10-CM

## 2011-06-11 ENCOUNTER — Encounter (HOSPITAL_BASED_OUTPATIENT_CLINIC_OR_DEPARTMENT_OTHER): Payer: PRIVATE HEALTH INSURANCE | Admitting: Hematology and Oncology

## 2011-06-11 DIAGNOSIS — Z452 Encounter for adjustment and management of vascular access device: Secondary | ICD-10-CM

## 2011-06-11 DIAGNOSIS — C20 Malignant neoplasm of rectum: Secondary | ICD-10-CM

## 2011-08-23 ENCOUNTER — Encounter (HOSPITAL_BASED_OUTPATIENT_CLINIC_OR_DEPARTMENT_OTHER): Payer: PRIVATE HEALTH INSURANCE | Admitting: Hematology and Oncology

## 2011-08-23 ENCOUNTER — Other Ambulatory Visit: Payer: Self-pay | Admitting: Hematology and Oncology

## 2011-08-23 ENCOUNTER — Ambulatory Visit (HOSPITAL_COMMUNITY)
Admission: RE | Admit: 2011-08-23 | Discharge: 2011-08-23 | Disposition: A | Discharge status: Home / Self Care | Payer: PRIVATE HEALTH INSURANCE | Source: Ambulatory Visit | Attending: Hematology and Oncology | Admitting: Hematology and Oncology

## 2011-08-23 DIAGNOSIS — C20 Malignant neoplasm of rectum: Secondary | ICD-10-CM

## 2011-08-23 DIAGNOSIS — Z452 Encounter for adjustment and management of vascular access device: Secondary | ICD-10-CM

## 2011-08-23 DIAGNOSIS — R0602 Shortness of breath: Secondary | ICD-10-CM | POA: Insufficient documentation

## 2011-08-23 DIAGNOSIS — I771 Stricture of artery: Secondary | ICD-10-CM | POA: Insufficient documentation

## 2011-08-23 LAB — CBC WITH DIFFERENTIAL/PLATELET
BASO%: 0.6 % (ref 0.0–2.0)
Eosinophils Absolute: 0.1 10*3/uL (ref 0.0–0.5)
LYMPH%: 28.1 % (ref 14.0–49.7)
MCHC: 33.3 g/dL (ref 31.5–36.0)
MCV: 78.9 fL — ABNORMAL LOW (ref 79.5–101.0)
MONO%: 11.2 % (ref 0.0–14.0)
NEUT#: 2.5 10*3/uL (ref 1.5–6.5)
Platelets: 329 10*3/uL (ref 145–400)
RBC: 4.48 10*6/uL (ref 3.70–5.45)
RDW: 17.5 % — ABNORMAL HIGH (ref 11.2–14.5)
WBC: 4.2 10*3/uL (ref 3.9–10.3)

## 2011-08-23 LAB — COMPREHENSIVE METABOLIC PANEL
ALT: 17 U/L (ref 0–35)
AST: 15 U/L (ref 0–37)
Albumin: 4.8 g/dL (ref 3.5–5.2)
Alkaline Phosphatase: 92 U/L (ref 39–117)
Potassium: 3.9 mEq/L (ref 3.5–5.3)
Sodium: 138 mEq/L (ref 135–145)
Total Bilirubin: 0.4 mg/dL (ref 0.3–1.2)
Total Protein: 7.5 g/dL (ref 6.0–8.3)

## 2011-08-28 ENCOUNTER — Encounter (HOSPITAL_BASED_OUTPATIENT_CLINIC_OR_DEPARTMENT_OTHER): Payer: PRIVATE HEALTH INSURANCE | Admitting: Hematology and Oncology

## 2011-08-28 DIAGNOSIS — Z923 Personal history of irradiation: Secondary | ICD-10-CM

## 2011-08-28 DIAGNOSIS — C20 Malignant neoplasm of rectum: Secondary | ICD-10-CM

## 2011-08-28 DIAGNOSIS — Z452 Encounter for adjustment and management of vascular access device: Secondary | ICD-10-CM

## 2011-10-04 ENCOUNTER — Other Ambulatory Visit: Payer: Self-pay | Admitting: Hematology and Oncology

## 2011-10-04 ENCOUNTER — Encounter: Payer: Self-pay | Admitting: Hematology and Oncology

## 2011-10-04 DIAGNOSIS — Z85048 Personal history of other malignant neoplasm of rectum, rectosigmoid junction, and anus: Secondary | ICD-10-CM

## 2011-10-04 DIAGNOSIS — C20 Malignant neoplasm of rectum: Secondary | ICD-10-CM

## 2011-10-04 HISTORY — DX: Personal history of other malignant neoplasm of rectum, rectosigmoid junction, and anus: Z85.048

## 2011-10-19 ENCOUNTER — Ambulatory Visit (HOSPITAL_BASED_OUTPATIENT_CLINIC_OR_DEPARTMENT_OTHER): Payer: PRIVATE HEALTH INSURANCE

## 2011-10-19 DIAGNOSIS — Z452 Encounter for adjustment and management of vascular access device: Secondary | ICD-10-CM

## 2011-10-19 DIAGNOSIS — C20 Malignant neoplasm of rectum: Secondary | ICD-10-CM

## 2011-10-19 MED ORDER — SODIUM CHLORIDE 0.9 % IJ SOLN
10.0000 mL | Freq: Once | INTRAMUSCULAR | Status: AC
  Administered 2011-10-19: 10 mL via INTRAVENOUS
  Filled 2011-10-19: qty 10

## 2011-10-19 MED ORDER — HEPARIN SOD (PORK) LOCK FLUSH 100 UNIT/ML IV SOLN
500.0000 [IU] | Freq: Once | INTRAVENOUS | Status: AC
  Administered 2011-10-19: 500 [IU] via INTRAVENOUS
  Filled 2011-10-19: qty 5

## 2011-12-18 ENCOUNTER — Ambulatory Visit (HOSPITAL_BASED_OUTPATIENT_CLINIC_OR_DEPARTMENT_OTHER): Payer: PRIVATE HEALTH INSURANCE

## 2011-12-18 VITALS — BP 139/81 | HR 73 | Temp 97.3°F

## 2011-12-18 DIAGNOSIS — Z469 Encounter for fitting and adjustment of unspecified device: Secondary | ICD-10-CM

## 2011-12-18 DIAGNOSIS — C20 Malignant neoplasm of rectum: Secondary | ICD-10-CM

## 2011-12-18 MED ORDER — SODIUM CHLORIDE 0.9 % IJ SOLN
10.0000 mL | INTRAMUSCULAR | Status: DC | PRN
  Administered 2011-12-18: 10 mL via INTRAVENOUS
  Filled 2011-12-18: qty 10

## 2011-12-18 MED ORDER — HEPARIN SOD (PORK) LOCK FLUSH 100 UNIT/ML IV SOLN
500.0000 [IU] | Freq: Once | INTRAVENOUS | Status: AC | PRN
  Administered 2011-12-18: 500 [IU] via INTRAVENOUS
  Filled 2011-12-18: qty 5

## 2012-02-12 ENCOUNTER — Ambulatory Visit (HOSPITAL_BASED_OUTPATIENT_CLINIC_OR_DEPARTMENT_OTHER): Payer: PRIVATE HEALTH INSURANCE

## 2012-02-12 ENCOUNTER — Ambulatory Visit: Payer: PRIVATE HEALTH INSURANCE | Admitting: Hematology and Oncology

## 2012-02-12 ENCOUNTER — Other Ambulatory Visit: Payer: Self-pay | Admitting: *Deleted

## 2012-02-12 ENCOUNTER — Other Ambulatory Visit: Payer: Self-pay | Admitting: Lab

## 2012-02-12 VITALS — BP 146/76 | HR 71 | Temp 98.8°F

## 2012-02-12 DIAGNOSIS — C20 Malignant neoplasm of rectum: Secondary | ICD-10-CM

## 2012-02-12 DIAGNOSIS — Z452 Encounter for adjustment and management of vascular access device: Secondary | ICD-10-CM

## 2012-02-12 LAB — COMPREHENSIVE METABOLIC PANEL
Alkaline Phosphatase: 85 U/L (ref 39–117)
BUN: 13 mg/dL (ref 6–23)
Glucose, Bld: 143 mg/dL — ABNORMAL HIGH (ref 70–99)
Total Bilirubin: 0.3 mg/dL (ref 0.3–1.2)

## 2012-02-12 LAB — CBC WITH DIFFERENTIAL/PLATELET
Basophils Absolute: 0 10*3/uL (ref 0.0–0.1)
Eosinophils Absolute: 0.1 10*3/uL (ref 0.0–0.5)
HGB: 11 g/dL — ABNORMAL LOW (ref 11.6–15.9)
LYMPH%: 26.4 % (ref 14.0–49.7)
MCV: 78.2 fL — ABNORMAL LOW (ref 79.5–101.0)
MONO%: 8.2 % (ref 0.0–14.0)
NEUT#: 2.4 10*3/uL (ref 1.5–6.5)
Platelets: 310 10*3/uL (ref 145–400)
RBC: 4.36 10*6/uL (ref 3.70–5.45)

## 2012-02-12 MED ORDER — HEPARIN SOD (PORK) LOCK FLUSH 100 UNIT/ML IV SOLN
500.0000 [IU] | Freq: Once | INTRAVENOUS | Status: AC | PRN
  Administered 2012-02-12: 500 [IU] via INTRAVENOUS
  Filled 2012-02-12: qty 5

## 2012-02-12 MED ORDER — SODIUM CHLORIDE 0.9 % IJ SOLN
10.0000 mL | INTRAMUSCULAR | Status: DC | PRN
  Administered 2012-02-12: 10 mL via INTRAVENOUS
  Filled 2012-02-12: qty 10

## 2012-02-13 ENCOUNTER — Ambulatory Visit (HOSPITAL_COMMUNITY)
Admission: RE | Admit: 2012-02-13 | Discharge: 2012-02-13 | Disposition: A | Discharge status: Home / Self Care | Payer: PRIVATE HEALTH INSURANCE | Source: Ambulatory Visit | Attending: Hematology and Oncology | Admitting: Hematology and Oncology

## 2012-02-13 ENCOUNTER — Other Ambulatory Visit: Payer: Self-pay | Admitting: Hematology and Oncology

## 2012-02-13 DIAGNOSIS — C801 Malignant (primary) neoplasm, unspecified: Secondary | ICD-10-CM

## 2012-02-13 DIAGNOSIS — M47814 Spondylosis without myelopathy or radiculopathy, thoracic region: Secondary | ICD-10-CM | POA: Insufficient documentation

## 2012-02-13 DIAGNOSIS — C189 Malignant neoplasm of colon, unspecified: Secondary | ICD-10-CM | POA: Insufficient documentation

## 2012-02-15 ENCOUNTER — Encounter: Payer: Self-pay | Admitting: Hematology and Oncology

## 2012-02-15 ENCOUNTER — Telehealth: Payer: Self-pay | Admitting: Hematology and Oncology

## 2012-02-15 ENCOUNTER — Ambulatory Visit (HOSPITAL_BASED_OUTPATIENT_CLINIC_OR_DEPARTMENT_OTHER): Payer: PRIVATE HEALTH INSURANCE | Admitting: Hematology and Oncology

## 2012-02-15 VITALS — BP 139/83 | HR 75 | Temp 98.0°F | Ht 63.0 in | Wt 200.3 lb

## 2012-02-15 DIAGNOSIS — C20 Malignant neoplasm of rectum: Secondary | ICD-10-CM

## 2012-02-15 MED ORDER — SODIUM CHLORIDE 0.9 % IJ SOLN
10.0000 mL | INTRAMUSCULAR | Status: DC | PRN
  Filled 2012-02-15: qty 10

## 2012-02-15 MED ORDER — HEPARIN SOD (PORK) LOCK FLUSH 100 UNIT/ML IV SOLN
500.0000 [IU] | Freq: Once | INTRAVENOUS | Status: DC
  Filled 2012-02-15: qty 5

## 2012-02-15 MED ORDER — ALTEPLASE 2 MG IJ SOLR
2.0000 mg | Freq: Once | INTRAMUSCULAR | Status: DC | PRN
  Filled 2012-02-15: qty 2

## 2012-02-15 NOTE — Progress Notes (Signed)
This office note has been dictated.

## 2012-02-15 NOTE — Telephone Encounter (Signed)
appts made and printed for pt aom °

## 2012-02-15 NOTE — Progress Notes (Signed)
CC:   Tonya Vincent, M.D. Tonya Hawks [name], MD Tonya Vincent, M.D.  IDENTIFYING STATEMENT:  The patient is a 61 year old woman with rectal cancer who presents for followup.  INTERVAL HISTORY:  [name] was last seen 6 months ago.  Since that time has had no major issues or concerns.  Specifically denying rectal bleeding or change in bowel function.  She denies abdominal pain, nausea and vomiting.  Unfortunately she has not as yet had a colonoscopy for financial reasons but plans to do so as soon as possible.  She received a chest x-ray on 02/13/2012 that showed no acute cardiopulmonary abnormalities.  MEDICATIONS:  Reviewed and updated.  PHYSICAL EXAMINATION:  General:  The patient is a well-appearing, well- nourished woman in no distress.  Vitals:  Pulse 75, blood pressure 139/83, temp 98, respirations 20, weight 200 pounds.  HEENT:  Head is atraumatic, normocephalic.  Sclerae anicteric.  Mouth moist.  Neck: Supple.  Chest:  Clear.  Port no signs infection.  CVS:  Unremarkable. Abdomen:  Obese soft, nontender.  Bowel sounds present.  Extremities: No calf tenderness.  Pulses present and symmetrical.  LAB DATA:  On 02/12/2012 white cell count 3.8, hemoglobin 11, hematocrit 34.1, platelets 210.  Sodium 139, potassium 4.2, chloride 103, CO2 22, BUN 13, creatinine 0.97, glucose 143, T bilirubin 0.3, alkaline phosphatase 85, AST 13, ALT 12, calcium 10.3, CEA less than 0.5.  Chest x-ray, as above.  IMPRESSION AND PLAN:  [name] is a 61 year old woman who is status post neoadjuvant continuous infusion 5-FU with external radiation therapy, followed by low anterior resection on 04/24/2010.  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  No lymph nodes were involved.  She received adjuvant chemotherapy in the form of infusional 5-FU between 08/30/2010 and 12/20/2010.  The patient's current exam and blood work, chest  x-ray shows no evidence of recurrence.  I have stressed the need of colonoscopy soon.  She follows up in 9 months' time with CT scans and blood work.    ______________________________ Tonya Vincent, M.D. LIO/MEDQ  D:  02/15/2012  T:  02/15/2012  Job:  [id number]

## 2012-02-15 NOTE — Patient Instructions (Signed)
Patient to follow up as instructed.   Current Outpatient Prescriptions  Medication Sig Dispense Refill  . Olmesartan-Amlodipine-HCTZ (TRIBENZOR) 20-5-12.5 MG TABS Take 1 tablet by mouth daily.      . Saxagliptin-Metformin (KOMBIGLYZE XR) 04-999 MG TB24 Take 1 tablet by mouth daily.            March 2013  Sunday Monday Tuesday Wednesday Thursday Friday Saturday                 1   2            3   4   5   6   7   8   9            10   11   12   FLUSH 30  10:30 AM  (30 min.)  [facility] Flush Nurse  [facility]   LAB ADDON  12:00 PM  (15 min.)  [facility] Lab Addon  [facility] 13   DG X-RAY  10:45 AM  (15 min.)  [facility]-Dg R/F 1  [facility] 14   15   EST PT 30  11:00 AM  (30 min.)  Verba Ainley I Latarsha Zani, MD  [facility] 16      Cycle 1, Treatment 2      17   18   19   20   21   22   23            24   25   26   27   28   29   30            31                                 Treatment Details  02/12/2012 - Cycle 1, Treatment 2     Supportive Care: heparin lock flush, sodium chloride, alteplase (CATHFLO ACTIVASE)     Nursing Communication: SCHEDULING COMMUNICATION INJECTION

## 2012-03-05 ENCOUNTER — Telehealth: Payer: Self-pay

## 2012-03-05 NOTE — Telephone Encounter (Signed)
Late Entry: See paper info.  LMOM on 02/25/2012 and on 03/04/2012 for a return call. Will mail letter for her to call.

## 2012-03-26 ENCOUNTER — Telehealth: Payer: Self-pay | Admitting: *Deleted

## 2012-03-26 NOTE — Telephone Encounter (Signed)
Patient called and canceled her appt for today. She wants to reschedule for first time Friday morning. I have called her back and left a message to call me back. JMW

## 2012-04-22 ENCOUNTER — Telehealth: Payer: Self-pay | Admitting: Hematology and Oncology

## 2012-04-22 ENCOUNTER — Ambulatory Visit (HOSPITAL_BASED_OUTPATIENT_CLINIC_OR_DEPARTMENT_OTHER): Payer: PRIVATE HEALTH INSURANCE

## 2012-04-22 VITALS — BP 132/71 | HR 73 | Temp 97.9°F

## 2012-04-22 DIAGNOSIS — Z452 Encounter for adjustment and management of vascular access device: Secondary | ICD-10-CM

## 2012-04-22 DIAGNOSIS — C20 Malignant neoplasm of rectum: Secondary | ICD-10-CM

## 2012-04-22 MED ORDER — SODIUM CHLORIDE 0.9 % IJ SOLN
10.0000 mL | INTRAMUSCULAR | Status: DC | PRN
  Administered 2012-04-22: 10 mL via INTRAVENOUS
  Filled 2012-04-22: qty 10

## 2012-04-22 MED ORDER — HEPARIN SOD (PORK) LOCK FLUSH 100 UNIT/ML IV SOLN
500.0000 [IU] | Freq: Once | INTRAVENOUS | Status: AC
  Administered 2012-04-22: 500 [IU] via INTRAVENOUS
  Filled 2012-04-22: qty 5

## 2012-04-22 NOTE — Telephone Encounter (Signed)
Pt came in today to r/s flush appts due to she is 3wks delayed w/getting flush today. Remaining flush appts were r/s q6w from 5/21. Pt given new schedule for July thru dec.

## 2012-06-03 ENCOUNTER — Ambulatory Visit (HOSPITAL_BASED_OUTPATIENT_CLINIC_OR_DEPARTMENT_OTHER): Payer: Medicare Other

## 2012-06-03 VITALS — BP 125/83 | HR 75 | Temp 97.8°F

## 2012-06-03 DIAGNOSIS — Z452 Encounter for adjustment and management of vascular access device: Secondary | ICD-10-CM

## 2012-06-03 DIAGNOSIS — C20 Malignant neoplasm of rectum: Secondary | ICD-10-CM

## 2012-06-03 MED ORDER — SODIUM CHLORIDE 0.9 % IJ SOLN
10.0000 mL | INTRAMUSCULAR | Status: DC | PRN
  Administered 2012-06-03: 10 mL via INTRAVENOUS
  Filled 2012-06-03: qty 10

## 2012-06-03 MED ORDER — HEPARIN SOD (PORK) LOCK FLUSH 100 UNIT/ML IV SOLN
500.0000 [IU] | Freq: Once | INTRAVENOUS | Status: AC | PRN
  Administered 2012-06-03: 500 [IU] via INTRAVENOUS
  Filled 2012-06-03: qty 5

## 2012-06-03 NOTE — Patient Instructions (Addendum)
Call MD for problems 

## 2012-07-15 ENCOUNTER — Ambulatory Visit (HOSPITAL_BASED_OUTPATIENT_CLINIC_OR_DEPARTMENT_OTHER): Payer: Medicare Other

## 2012-07-15 VITALS — BP 135/83 | HR 70 | Temp 97.8°F

## 2012-07-15 DIAGNOSIS — Z452 Encounter for adjustment and management of vascular access device: Secondary | ICD-10-CM

## 2012-07-15 DIAGNOSIS — C20 Malignant neoplasm of rectum: Secondary | ICD-10-CM

## 2012-07-15 MED ORDER — HEPARIN SOD (PORK) LOCK FLUSH 100 UNIT/ML IV SOLN
500.0000 [IU] | Freq: Once | INTRAVENOUS | Status: AC | PRN
  Administered 2012-07-15: 500 [IU] via INTRAVENOUS
  Filled 2012-07-15: qty 5

## 2012-07-15 MED ORDER — SODIUM CHLORIDE 0.9 % IJ SOLN
10.0000 mL | INTRAMUSCULAR | Status: DC | PRN
  Administered 2012-07-15: 10 mL via INTRAVENOUS
  Filled 2012-07-15: qty 10

## 2012-07-15 NOTE — Progress Notes (Signed)
Unable to obtain blood return.  MD notified.

## 2012-07-16 ENCOUNTER — Telehealth: Payer: Self-pay | Admitting: *Deleted

## 2012-07-16 NOTE — Telephone Encounter (Signed)
Received info from Terri, flush room nurse today re:  Pt came for port flush today,  However,  portacath did not give blood return when flushed for past 2 times.   Dr. Dalene Carrow notified.   Per md,  Pt will need to have alteplase for port with the flush. Pt informed of md's instructions.   Terri in flush room also notified.

## 2012-07-16 NOTE — Progress Notes (Signed)
Received staff message from Adella Hare, RN to advise about alteplase in pt's port-a-cath next time she is here for flush.  This message left for desk RN.

## 2012-08-26 ENCOUNTER — Ambulatory Visit (HOSPITAL_BASED_OUTPATIENT_CLINIC_OR_DEPARTMENT_OTHER): Payer: Medicare Other

## 2012-08-26 ENCOUNTER — Ambulatory Visit
Admission: RE | Admit: 2012-08-26 | Discharge: 2012-08-26 | Disposition: A | Discharge status: Home / Self Care | Payer: Medicare Other | Source: Ambulatory Visit | Attending: Orthopedic Surgery | Admitting: Orthopedic Surgery

## 2012-08-26 ENCOUNTER — Other Ambulatory Visit: Payer: Self-pay | Admitting: Orthopedic Surgery

## 2012-08-26 VITALS — BP 149/82 | HR 71 | Temp 98.1°F | Resp 18

## 2012-08-26 DIAGNOSIS — M79671 Pain in right foot: Secondary | ICD-10-CM

## 2012-08-26 DIAGNOSIS — Z452 Encounter for adjustment and management of vascular access device: Secondary | ICD-10-CM

## 2012-08-26 DIAGNOSIS — C20 Malignant neoplasm of rectum: Secondary | ICD-10-CM

## 2012-08-26 DIAGNOSIS — M25561 Pain in right knee: Secondary | ICD-10-CM

## 2012-08-26 MED ORDER — ALTEPLASE 2 MG IJ SOLR
2.0000 mg | Freq: Once | INTRAMUSCULAR | Status: DC | PRN
  Filled 2012-08-26: qty 2

## 2012-08-26 MED ORDER — HEPARIN SOD (PORK) LOCK FLUSH 100 UNIT/ML IV SOLN
500.0000 [IU] | Freq: Once | INTRAVENOUS | Status: AC
  Administered 2012-08-26: 500 [IU] via INTRAVENOUS
  Filled 2012-08-26: qty 5

## 2012-08-26 MED ORDER — SODIUM CHLORIDE 0.9 % IJ SOLN
10.0000 mL | INTRAMUSCULAR | Status: DC | PRN
  Administered 2012-08-26: 10 mL via INTRAVENOUS
  Filled 2012-08-26: qty 10

## 2012-08-26 NOTE — Progress Notes (Signed)
Good blood return noted from port with pt leaning forward and cough.  ativase not indicated.  dmr

## 2012-10-03 ENCOUNTER — Telehealth: Payer: Self-pay | Admitting: Hematology and Oncology

## 2012-10-03 NOTE — Telephone Encounter (Signed)
s/w pt,called her back as she changed her 11/5 flush to 11/7     aom

## 2012-10-09 ENCOUNTER — Ambulatory Visit (HOSPITAL_BASED_OUTPATIENT_CLINIC_OR_DEPARTMENT_OTHER): Payer: Medicare Other

## 2012-10-09 VITALS — BP 157/81 | HR 67 | Temp 98.6°F

## 2012-10-09 DIAGNOSIS — C20 Malignant neoplasm of rectum: Secondary | ICD-10-CM

## 2012-10-09 DIAGNOSIS — Z452 Encounter for adjustment and management of vascular access device: Secondary | ICD-10-CM

## 2012-10-09 MED ORDER — HEPARIN SOD (PORK) LOCK FLUSH 100 UNIT/ML IV SOLN
500.0000 [IU] | Freq: Once | INTRAVENOUS | Status: AC | PRN
  Administered 2012-10-09: 500 [IU] via INTRAVENOUS
  Filled 2012-10-09: qty 5

## 2012-10-09 MED ORDER — SODIUM CHLORIDE 0.9 % IJ SOLN
10.0000 mL | INTRAMUSCULAR | Status: DC | PRN
  Administered 2012-10-09: 10 mL via INTRAVENOUS
  Filled 2012-10-09: qty 10

## 2012-10-09 MED ORDER — ALTEPLASE 2 MG IJ SOLR
2.0000 mg | Freq: Once | INTRAMUSCULAR | Status: AC | PRN
  Administered 2012-10-09: 2 mg
  Filled 2012-10-09: qty 2

## 2012-10-09 NOTE — Progress Notes (Signed)
Port flushed easily, no blood return noted Cathflo administered  At 1514.  1620 Positive blood return noted, port flushed and de accessed. Patient tolerated well.

## 2012-11-05 ENCOUNTER — Ambulatory Visit (HOSPITAL_COMMUNITY)
Admission: RE | Admit: 2012-11-05 | Discharge: 2012-11-05 | Disposition: A | Discharge status: Home / Self Care | Payer: Medicare Other | Source: Ambulatory Visit | Attending: Hematology and Oncology | Admitting: Hematology and Oncology

## 2012-11-05 ENCOUNTER — Ambulatory Visit: Payer: PRIVATE HEALTH INSURANCE | Admitting: Hematology and Oncology

## 2012-11-05 ENCOUNTER — Other Ambulatory Visit (HOSPITAL_BASED_OUTPATIENT_CLINIC_OR_DEPARTMENT_OTHER): Payer: Medicare Other | Admitting: Lab

## 2012-11-05 DIAGNOSIS — Z90710 Acquired absence of both cervix and uterus: Secondary | ICD-10-CM | POA: Insufficient documentation

## 2012-11-05 DIAGNOSIS — C20 Malignant neoplasm of rectum: Secondary | ICD-10-CM

## 2012-11-05 DIAGNOSIS — Z9221 Personal history of antineoplastic chemotherapy: Secondary | ICD-10-CM | POA: Insufficient documentation

## 2012-11-05 DIAGNOSIS — R079 Chest pain, unspecified: Secondary | ICD-10-CM | POA: Insufficient documentation

## 2012-11-05 DIAGNOSIS — Z923 Personal history of irradiation: Secondary | ICD-10-CM | POA: Insufficient documentation

## 2012-11-05 DIAGNOSIS — K7689 Other specified diseases of liver: Secondary | ICD-10-CM | POA: Insufficient documentation

## 2012-11-05 DIAGNOSIS — E119 Type 2 diabetes mellitus without complications: Secondary | ICD-10-CM | POA: Insufficient documentation

## 2012-11-05 DIAGNOSIS — R160 Hepatomegaly, not elsewhere classified: Secondary | ICD-10-CM | POA: Insufficient documentation

## 2012-11-05 LAB — CBC WITH DIFFERENTIAL/PLATELET
Basophils Absolute: 0 10*3/uL (ref 0.0–0.1)
EOS%: 3.1 % (ref 0.0–7.0)
HCT: 40.6 % (ref 34.8–46.6)
HGB: 13.5 g/dL (ref 11.6–15.9)
LYMPH%: 40.9 % (ref 14.0–49.7)
MCH: 26.3 pg (ref 25.1–34.0)
MCHC: 33.2 g/dL (ref 31.5–36.0)
NEUT%: 42.9 % (ref 38.4–76.8)
Platelets: 301 10*3/uL (ref 145–400)
lymph#: 1.6 10*3/uL (ref 0.9–3.3)

## 2012-11-05 LAB — COMPREHENSIVE METABOLIC PANEL (CC13)
Albumin: 4.2 g/dL (ref 3.5–5.0)
BUN: 13 mg/dL (ref 7.0–26.0)
Calcium: 10.1 mg/dL (ref 8.4–10.4)
Chloride: 99 mEq/L (ref 98–107)
Creatinine: 1.2 mg/dL — ABNORMAL HIGH (ref 0.6–1.1)
Glucose: 167 mg/dl — ABNORMAL HIGH (ref 70–99)
Potassium: 4 mEq/L (ref 3.5–5.1)

## 2012-11-05 MED ORDER — IOHEXOL 300 MG/ML  SOLN
100.0000 mL | Freq: Once | INTRAMUSCULAR | Status: AC | PRN
  Administered 2012-11-05: 100 mL via INTRAVENOUS

## 2012-11-12 ENCOUNTER — Telehealth: Payer: Self-pay | Admitting: Hematology and Oncology

## 2012-11-12 ENCOUNTER — Encounter: Payer: Self-pay | Admitting: Hematology and Oncology

## 2012-11-12 ENCOUNTER — Ambulatory Visit (HOSPITAL_BASED_OUTPATIENT_CLINIC_OR_DEPARTMENT_OTHER): Payer: Medicare Other | Admitting: Hematology and Oncology

## 2012-11-12 VITALS — BP 150/81 | HR 73 | Temp 97.1°F | Resp 20 | Ht 63.0 in | Wt 200.9 lb

## 2012-11-12 DIAGNOSIS — C20 Malignant neoplasm of rectum: Secondary | ICD-10-CM

## 2012-11-12 NOTE — Patient Instructions (Addendum)
Tonya Vincent  [id number]   [facility] - AFTER VISIT SUMMARY   **RECOMMENDATIONS MADE BY THE CONSULTANT AND ANY TEST    RESULTS WILL BE SENT TO YOUR REFERRING DOCTORS.   YOUR EXAM FINDINGS, LABS AND RESULTS WERE DISCUSSED BY YOUR MD TODAY.  YOU CAN GO TO THE [facility] WEB SITE FOR INSTRUCTIONS ON HOW TO ASSESS MY CHART FOR ADDITIONAL INFORMATION AS NEEDED.  Your Updated drug allergies are: Allergies as of 11/12/2012 - Review Complete 11/12/2012  Allergen Reaction Noted  . Norgesic (orphenadrine-aspirin-caffeine)  04/24/2011    Your current list of medications are: Current Outpatient Prescriptions  Medication Sig Dispense Refill  . etodolac (LODINE) 400 MG tablet Take 400 mg by mouth as needed.      Marland Kitchen ibuprofen (ADVIL,MOTRIN) 800 MG tablet Take 800 mg by mouth as needed.      . Olmesartan-Amlodipine-HCTZ (TRIBENZOR) 20-5-12.5 MG TABS Take 1 tablet by mouth daily.      . Saxagliptin-Metformin (KOMBIGLYZE XR) 04-999 MG TB24 Take 1 tablet by mouth daily.         INSTRUCTIONS GIVEN AND DISCUSSED:  See attached schedule   SPECIAL INSTRUCTIONS/FOLLOW-UP:  See above.  I acknowledge that I have been informed and understand all the instructions given to me and received a copy.I know to contact the clinic, my physician, or go to the emergency Department if any problems should occur.   I do not have any more questions at this time, but understand that I may call the [facility] at [phone] during business hours should I have any further questions or need assistance in obtaining follow-up care.

## 2012-11-12 NOTE — Telephone Encounter (Signed)
appts made and printed for pt pt aware that cen. sch. Will call with the mri appt Pt also aware that i will call with the novo neuro surg appt when i rec the ref and to fax records, and i will call the pt

## 2012-11-12 NOTE — Progress Notes (Signed)
This office note has been dictated.

## 2012-11-13 NOTE — Progress Notes (Signed)
CC:   Tonya Vincent, M.D. Tonya Hawks [name], MD Tonya Vincent, M.D.  IDENTIFYING STATEMENT:  The patient is a 61 year old woman with rectal cancer who presents for followup.  INTERVAL HISTORY:  Patient was seen 9 months ago.  Has no current concerns.  Has noted no change in bowel function.  Denies rectal bleeding.  Her weight is stable.  We reviewed results of the CT scan of the chest, abdomen and pelvis on 11/05/2012.  The chest showed no evidence of metastatic disease within the chest.  There remained a chronic, right paravertebral soft tissue mass measuring 2.8 x 2.2 cm.  DIFFERENTIAL DIAGNOSIS:  Neurogenic tumor, fibrous tumor of the pleura or a somewhat atypical location over GI diphtheria cysts or an area of extramedullary hematopoiesis.  The liver showed moderate hepatic steatosis and vaguely heterogenous enhancement throughout the right lobe of the liver.  The most well-defined area measuring 1.4 cm.  This area was not apparent on a prior CT scan.  There was no abdominal or pelvic adenopathy.  MEDICATIONS:  Reviewed and updated.  ALLERGIES:  Duragesic.  PAST MEDICAL HISTORY/FAMILY HISTORY/SOCIAL HISTORY:  Unchanged.  REVIEW OF SYSTEMS:  A 10-point review of systems negative.  PHYSICAL EXAM:  General: Patient is a well-appearing woman in no distress.  Vitals:  Pulse 73, blood pressure 150/81, temperature 97.1, respirations 20, weight 200.9 pounds.  HEENT:  Head is atraumatic, normocephalic.  Sclerae anicteric.  Mouth is moist.  Neck:  Supple. Chest:  Clear.  Abdomen:  Soft, nontender.  Bowel sounds present. Extremities:  No edema.  Port:  No signs of infection.  LAB DATA:  On 11/05/2012 white cell count 4, hemoglobin 13.5, hematocrit 40.6, platelets 301, sodium 142, potassium 4, chloride 99, CO2 29, BUN 13, creatinine 1.2, glucose 167, t bilirubin 0.24.  Alkaline phosphatase 102, AST 12, ALT 15, calcium 10.1.  Results of CTs are as in interval history.  IMPRESSION AND  PLAN:  Tonya Vincent is a 61 year old woman who is status post neoadjuvant continuous infusion 5-FU with external radiation therapy followed by low anterior resection on 04/24/2010.  The tumor was downgraded to a T3 N0.  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  No lymph nodes involved.  She failed to follow up for adjuvant therapy following surgery. But when she eventually did see received infusion of 5-FU between 08/30/2010 and 12/20/2010.  Her current CTs indicate vague findings in the liver, so I will have her schedule an MRI and we will telephone her with those results.  With regards to the right paraspinal heterogenous mass, I will have her have this assessed by neuro surgery.  Otherwise, if all is well she follows up in 9 months' time with labs.    ______________________________ Tonya Vincent, M.D. LIO/MEDQ  D:  11/12/2012  T:  11/13/2012  Job:  [id number]

## 2012-11-19 ENCOUNTER — Other Ambulatory Visit: Payer: Self-pay | Admitting: Hematology and Oncology

## 2012-11-19 ENCOUNTER — Ambulatory Visit (HOSPITAL_COMMUNITY)
Admission: RE | Admit: 2012-11-19 | Discharge: 2012-11-19 | Disposition: A | Discharge status: Home / Self Care | Payer: Medicare Other | Source: Ambulatory Visit | Attending: Hematology and Oncology | Admitting: Hematology and Oncology

## 2012-11-19 DIAGNOSIS — C20 Malignant neoplasm of rectum: Secondary | ICD-10-CM

## 2012-11-19 DIAGNOSIS — D1800 Hemangioma unspecified site: Secondary | ICD-10-CM | POA: Insufficient documentation

## 2012-11-19 DIAGNOSIS — K7689 Other specified diseases of liver: Secondary | ICD-10-CM | POA: Insufficient documentation

## 2012-11-19 MED ORDER — GADOXETATE DISODIUM 0.25 MMOL/ML IV SOLN
9.0000 mL | Freq: Once | INTRAVENOUS | Status: AC | PRN
  Administered 2012-11-19: 9 mL via INTRAVENOUS

## 2012-11-20 ENCOUNTER — Telehealth: Payer: Self-pay | Admitting: Nurse Practitioner

## 2012-11-20 NOTE — Telephone Encounter (Signed)
Message copied by Barbara Cower on Thu Nov 20, 2012  2:57 PM ------      Message from: Arlan Organ I      Created: Thu Nov 20, 2012  9:24 AM       Call pt - Her scans look great

## 2012-11-20 NOTE — Telephone Encounter (Signed)
Called patient re: scan results per Dr. Dalene Carrow.

## 2012-12-01 ENCOUNTER — Telehealth: Payer: Self-pay | Admitting: Oncology

## 2012-12-01 NOTE — Telephone Encounter (Signed)
Former pt of LO re-establishing w/BS. Changed provider for 9/19 appt to BS. lmonvm for pt asking that she return my call re her appts here at the office.

## 2012-12-04 ENCOUNTER — Ambulatory Visit (HOSPITAL_BASED_OUTPATIENT_CLINIC_OR_DEPARTMENT_OTHER): Payer: Medicare Other

## 2012-12-04 VITALS — BP 145/78 | HR 81 | Temp 97.8°F

## 2012-12-04 DIAGNOSIS — C20 Malignant neoplasm of rectum: Secondary | ICD-10-CM

## 2012-12-04 DIAGNOSIS — Z452 Encounter for adjustment and management of vascular access device: Secondary | ICD-10-CM

## 2012-12-04 MED ORDER — SODIUM CHLORIDE 0.9 % IJ SOLN
10.0000 mL | INTRAMUSCULAR | Status: DC | PRN
  Administered 2012-12-04: 10 mL via INTRAVENOUS
  Filled 2012-12-04: qty 10

## 2012-12-04 MED ORDER — HEPARIN SOD (PORK) LOCK FLUSH 100 UNIT/ML IV SOLN
500.0000 [IU] | Freq: Once | INTRAVENOUS | Status: AC
  Administered 2012-12-04: 500 [IU] via INTRAVENOUS
  Filled 2012-12-04: qty 5

## 2013-01-06 ENCOUNTER — Telehealth: Payer: Self-pay | Admitting: *Deleted

## 2013-01-06 NOTE — Telephone Encounter (Signed)
VERBAL ORDER AND READ BACK TO DR.SHERRILL- PT. DOES NOT NEED TO BE PRE-MEDICATED DUE TO HAVING A PORT A CATH. THE ABOVE INFORMATION WAS FAXED TO DR.VINCENT'S OFFICE.

## 2013-01-24 ENCOUNTER — Encounter: Payer: Self-pay | Admitting: *Deleted

## 2013-01-29 ENCOUNTER — Ambulatory Visit (HOSPITAL_BASED_OUTPATIENT_CLINIC_OR_DEPARTMENT_OTHER): Payer: Medicare Other

## 2013-01-29 VITALS — BP 131/77 | HR 86 | Temp 98.1°F

## 2013-01-29 MED ORDER — SODIUM CHLORIDE 0.9 % IJ SOLN
10.0000 mL | INTRAMUSCULAR | Status: DC | PRN
  Administered 2013-01-29: 10 mL via INTRAVENOUS
  Filled 2013-01-29: qty 10

## 2013-01-29 MED ORDER — HEPARIN SOD (PORK) LOCK FLUSH 100 UNIT/ML IV SOLN
500.0000 [IU] | Freq: Once | INTRAVENOUS | Status: AC | PRN
  Administered 2013-01-29: 500 [IU] via INTRAVENOUS
  Filled 2013-01-29: qty 5

## 2013-01-29 NOTE — Progress Notes (Signed)
Port accessed - flushes well - would not draw blood.  Patient did not want to wait 2 hrs for TPA.  Notified MD.

## 2013-03-26 ENCOUNTER — Ambulatory Visit (HOSPITAL_BASED_OUTPATIENT_CLINIC_OR_DEPARTMENT_OTHER): Payer: Medicare Other

## 2013-03-26 VITALS — BP 137/68 | HR 83 | Temp 98.0°F

## 2013-03-26 DIAGNOSIS — C20 Malignant neoplasm of rectum: Secondary | ICD-10-CM

## 2013-03-26 DIAGNOSIS — Z452 Encounter for adjustment and management of vascular access device: Secondary | ICD-10-CM

## 2013-03-26 MED ORDER — HEPARIN SOD (PORK) LOCK FLUSH 100 UNIT/ML IV SOLN
500.0000 [IU] | Freq: Once | INTRAVENOUS | Status: AC
  Administered 2013-03-26: 500 [IU] via INTRAVENOUS
  Filled 2013-03-26: qty 5

## 2013-03-26 MED ORDER — SODIUM CHLORIDE 0.9 % IJ SOLN
10.0000 mL | INTRAMUSCULAR | Status: DC | PRN
  Administered 2013-03-26: 10 mL via INTRAVENOUS
  Filled 2013-03-26: qty 10

## 2013-05-21 ENCOUNTER — Ambulatory Visit (HOSPITAL_BASED_OUTPATIENT_CLINIC_OR_DEPARTMENT_OTHER): Payer: Medicare Other

## 2013-05-21 VITALS — BP 142/75 | HR 64 | Temp 98.0°F | Resp 18

## 2013-05-21 DIAGNOSIS — Z452 Encounter for adjustment and management of vascular access device: Secondary | ICD-10-CM

## 2013-05-21 DIAGNOSIS — C20 Malignant neoplasm of rectum: Secondary | ICD-10-CM

## 2013-05-21 MED ORDER — SODIUM CHLORIDE 0.9 % IJ SOLN
10.0000 mL | INTRAMUSCULAR | Status: DC | PRN
  Administered 2013-05-21: 10 mL via INTRAVENOUS
  Filled 2013-05-21: qty 10

## 2013-05-21 MED ORDER — HEPARIN SOD (PORK) LOCK FLUSH 100 UNIT/ML IV SOLN
500.0000 [IU] | Freq: Once | INTRAVENOUS | Status: AC
  Administered 2013-05-21: 500 [IU] via INTRAVENOUS
  Filled 2013-05-21: qty 5

## 2013-05-21 NOTE — Patient Instructions (Signed)
Call MD for problems or concerns 

## 2013-05-21 NOTE — Progress Notes (Signed)
Unable to get blood return from port.  This is not unusual for this patient

## 2013-07-31 ENCOUNTER — Other Ambulatory Visit (HOSPITAL_COMMUNITY)
Admission: RE | Admit: 2013-07-31 | Discharge: 2013-07-31 | Disposition: A | Discharge status: Home / Self Care | Payer: Medicare Other | Source: Ambulatory Visit | Attending: Family Medicine | Admitting: Family Medicine

## 2013-07-31 ENCOUNTER — Other Ambulatory Visit: Payer: Self-pay | Admitting: Family Medicine

## 2013-07-31 DIAGNOSIS — Z01419 Encounter for gynecological examination (general) (routine) without abnormal findings: Secondary | ICD-10-CM | POA: Insufficient documentation

## 2013-07-31 DIAGNOSIS — Z1151 Encounter for screening for human papillomavirus (HPV): Secondary | ICD-10-CM | POA: Insufficient documentation

## 2013-08-01 ENCOUNTER — Ambulatory Visit (HOSPITAL_COMMUNITY)
Admission: RE | Admit: 2013-08-01 | Discharge: 2013-08-01 | Disposition: A | Discharge status: Home / Self Care | Payer: Medicare Other | Source: Ambulatory Visit | Attending: Family Medicine | Admitting: Family Medicine

## 2013-08-01 ENCOUNTER — Other Ambulatory Visit (HOSPITAL_COMMUNITY): Payer: Self-pay | Admitting: Family Medicine

## 2013-08-01 DIAGNOSIS — R0781 Pleurodynia: Secondary | ICD-10-CM

## 2013-08-01 DIAGNOSIS — R079 Chest pain, unspecified: Secondary | ICD-10-CM | POA: Insufficient documentation

## 2013-08-14 ENCOUNTER — Telehealth: Payer: Self-pay | Admitting: Hematology and Oncology

## 2013-08-14 NOTE — Telephone Encounter (Signed)
Called pt's sister , left message for pt to call us back regarding appt moved to a different MD and different date

## 2013-08-17 ENCOUNTER — Other Ambulatory Visit: Payer: Medicare Other | Admitting: Lab

## 2013-08-20 ENCOUNTER — Telehealth: Payer: Self-pay | Admitting: Hematology and Oncology

## 2013-08-20 NOTE — Telephone Encounter (Signed)
Called pt re change in time for 9/22 appt. appt for lb/NG moved to 12pm due to early AM appt needed for tx pt. Pt number not working. Called sister and per sister she is not sure what is going on w/pt and pt is aware we wanted to moved appt to 9/22 @ 8am. Sister made aware that 8am time is being moved to 12pm. Sister said that home number is not working but pt can be reached @ [phone] which is her cell and is connected. lmonvm for pt re appt for 9/22 @ 12pm.

## 2013-08-21 ENCOUNTER — Other Ambulatory Visit: Payer: Self-pay | Admitting: *Deleted

## 2013-08-21 ENCOUNTER — Ambulatory Visit: Payer: Medicare Other | Admitting: Hematology and Oncology

## 2013-08-21 ENCOUNTER — Ambulatory Visit: Payer: Medicare Other | Admitting: Oncology

## 2013-08-21 DIAGNOSIS — C20 Malignant neoplasm of rectum: Secondary | ICD-10-CM

## 2013-08-24 ENCOUNTER — Telehealth: Payer: Self-pay | Admitting: Hematology and Oncology

## 2013-08-24 ENCOUNTER — Ambulatory Visit: Payer: Medicare Other | Admitting: Hematology and Oncology

## 2013-08-24 ENCOUNTER — Ambulatory Visit (HOSPITAL_BASED_OUTPATIENT_CLINIC_OR_DEPARTMENT_OTHER): Payer: Medicare Other | Admitting: Hematology and Oncology

## 2013-08-24 ENCOUNTER — Other Ambulatory Visit: Payer: Medicare Other | Admitting: Lab

## 2013-08-24 ENCOUNTER — Encounter: Payer: Self-pay | Admitting: Hematology and Oncology

## 2013-08-24 VITALS — BP 145/88 | HR 87 | Temp 97.5°F | Resp 20 | Ht 63.0 in | Wt 202.2 lb

## 2013-08-24 DIAGNOSIS — C20 Malignant neoplasm of rectum: Secondary | ICD-10-CM

## 2013-08-24 NOTE — Progress Notes (Signed)
[facility] OFFICE PROGRESS NOTE  Geraldo Pitter, MD [phone] N. [address] [place] [address] No chief complaint on file.   DIAGNOSIS: History of locally advanced rectal cancer T3, N0, M0, status post neoadjuvant chemoradiation therapy followed by low anterior resection, no evidence of disease recurrence  SUMMARY OF ONCOLOGIC HISTORY: A review of her records extensively and collaborated the history with the patient. This lady was diagnosed with locally advanced rectal cancer, underwent neoadjuvant chemoradiation therapy with 5-FU, followed by surgery.  INTERVAL HISTORY: Tonya Vincent 62 y.o. female returns for a routine visit pertaining to her prior history of rectal cancer. She denies any recent the mean of pain, bloating, abnormal weight loss, or hematochezia. Her last CT scan done in December of 2013 show no evidence of disease recurrence. She is actually due for colonoscopy and she is contacting her doctor's office to schedule this. Her only complaint today is exposure to the different patterns and developed severe exfoliating dermatitis around August 19. This was managed conservatively and is improving.  I have reviewed the past medical history, past surgical history, social history and family history with the patient and they are unchanged from previous note.  ALLERGIES:  is allergic to antibacterial hand soap and norgesic.  MEDICATIONS: has a current medication list which includes the following prescription(s): levomilnacipran hcl er, olmesartan-amlodipine-hctz, and saxagliptin-metformin.  REVIEW OF SYSTEMS:   Constitutional: Denies fevers, chills or abnormal weight loss Eyes: Denies blurriness of vision Ears, nose, mouth, throat, and face: Denies mucositis or sore throat Respiratory: Denies cough, dyspnea or wheezes Cardiovascular: Denies palpitation, chest discomfort or lower extremity swelling Gastrointestinal:  Denies nausea, heartburn or change in bowel  habits Skin: Denies abnormal skin rashes Lymphatics: Denies new lymphadenopathy or easy bruising Neurological:Denies numbness, tingling or new weaknesses Behavioral/Psych: Mood is stable, no new changes  All other systems were reviewed with the patient and are negative.  PHYSICAL EXAMINATION: ECOG PERFORMANCE STATUS: 0 - Asymptomatic  Filed Vitals:   08/24/13 1252  BP: 145/88  Pulse: 87  Temp: 97.5 F (36.4 C)  Resp: 20    GENERAL:alert, no distress and comfortable SKIN: skin color, texture, turgor are normal, noticed significant healing of the skin in both her hands but no evidence of ulceration. EYES: normal, Conjunctiva are pink and non-injected, sclera clear OROPHARYNX:no exudate, no erythema and lips, buccal mucosa, and tongue normal  NECK: supple, thyroid normal size, non-tender, without nodularity LYMPH:  no palpable lymphadenopathy in the cervical, axillary or inguinal LUNGS: clear to auscultation and percussion with normal breathing effort HEART: regular rate & rhythm and no murmurs and no lower extremity edema ABDOMEN:abdomen soft, non-tender and normal bowel sounds note a well-healed surgical scar Musculoskeletal:no cyanosis of digits and no clubbing  NEURO: alert & oriented x 3 with fluent speech, no focal motor/sensory deficits  LABORATORY DATA:  I have reviewed the data as listed    Component Value Date/Time   NA 142 11/05/2012 0842   NA 139 02/12/2012 1150   NA 148* 04/24/2011 0837   K 4.0 11/05/2012 0842   K 4.2 02/12/2012 1150   K 4.1 04/24/2011 0837   CL 99 11/05/2012 0842   CL 103 02/12/2012 1150   CL 98 04/24/2011 0837   CO2 29 11/05/2012 0842   CO2 22 02/12/2012 1150   CO2 27 04/24/2011 0837   GLUCOSE 167* 11/05/2012 0842   GLUCOSE 143* 02/12/2012 1150   GLUCOSE 143* 04/24/2011 0837   BUN 13.0 11/05/2012 4098  BUN 13 02/12/2012 1150   BUN 13 04/24/2011 0837   CREATININE 1.2* 11/05/2012 0842   CREATININE 0.97 02/12/2012 1150   CREATININE 0.9 04/24/2011 0837    CALCIUM 10.1 11/05/2012 0842   CALCIUM 10.3 02/12/2012 1150   CALCIUM 9.4 04/24/2011 0837   PROT 8.4* 11/05/2012 0842   PROT 7.1 02/12/2012 1150   PROT 7.6 04/24/2011 0837   ALBUMIN 4.2 11/05/2012 0842   ALBUMIN 4.3 02/12/2012 1150   AST 12 11/05/2012 0842   AST 13 02/12/2012 1150   AST 21 04/24/2011 0837   ALT 15 11/05/2012 0842   ALT 12 02/12/2012 1150   ALT 27 04/24/2011 0837   ALKPHOS 102 11/05/2012 0842   ALKPHOS 85 02/12/2012 1150   ALKPHOS 83 04/24/2011 0837   BILITOT 0.24 11/05/2012 0842   BILITOT 0.3 02/12/2012 1150   BILITOT 0.50 04/24/2011 0837   GFRNONAA 50* 04/29/2010 0915   GFRAA  Value: >60        The eGFR has been calculated using the MDRD equation. This calculation has not been validated in all clinical situations. eGFR's persistently <60 mL/min signify possible Chronic Kidney Disease. 04/29/2010 0915    I No results found for this basename: SPEP, UPEP,  kappa and lambda light chains    Lab Results  Component Value Date   WBC 4.0 11/05/2012   NEUTROABS 1.7 11/05/2012   HGB 13.5 11/05/2012   HCT 40.6 11/05/2012   MCV 79.3* 11/05/2012   PLT 301 11/05/2012      Chemistry      Component Value Date/Time   NA 142 11/05/2012 0842   NA 139 02/12/2012 1150   NA 148* 04/24/2011 0837   K 4.0 11/05/2012 0842   K 4.2 02/12/2012 1150   K 4.1 04/24/2011 0837   CL 99 11/05/2012 0842   CL 103 02/12/2012 1150   CL 98 04/24/2011 0837   CO2 29 11/05/2012 0842   CO2 22 02/12/2012 1150   CO2 27 04/24/2011 0837   BUN 13.0 11/05/2012 0842   BUN 13 02/12/2012 1150   BUN 13 04/24/2011 0837   CREATININE 1.2* 11/05/2012 0842   CREATININE 0.97 02/12/2012 1150   CREATININE 0.9 04/24/2011 0837      Component Value Date/Time   CALCIUM 10.1 11/05/2012 0842   CALCIUM 10.3 02/12/2012 1150   CALCIUM 9.4 04/24/2011 0837   ALKPHOS 102 11/05/2012 0842   ALKPHOS 85 02/12/2012 1150   ALKPHOS 83 04/24/2011 0837   AST 12 11/05/2012 0842   AST 13 02/12/2012 1150   AST 21 04/24/2011 0837   ALT 15 11/05/2012 0842   ALT 12  02/12/2012 1150   ALT 27 04/24/2011 0837   BILITOT 0.24 11/05/2012 0842   BILITOT 0.3 02/12/2012 1150   BILITOT 0.50 04/24/2011 0837     ASSESSMENT: History of locally advanced rectal cancer status post chemoradiation therapy followed by surgery, no evidence of active disease   PLAN:  #1 rectal cancer T3, N0, M0 CEA is pending from today's visit. Her blood work otherwise were within normal limits. I would like to see her back early next year in about 4 months with another repeat CT scan of the chest abdomen and pelvis. It is normal I will like to see her back in 6 months again. The patient is due for colonoscopy and she had number to call to schedule that appointment herself #2 significant allergic dermatitis This is improving. I recommend conservative management with topical emollient cream #3 preventive care The patient has  complete hysterectomy in the past and does not require any Pap smear. Her last mammogram done recently was normal. Who presented with colonoscopy as described above. The patient will be influenza vaccination and she will try to get it from her primary care provider.  All questions were answered. The patient knows to call the clinic with any problems, questions or concerns. We can certainly see the patient much sooner if necessary. No barriers to learning was detected.  The patient and plan discussed with [facility], Tonya Vincent  and she is in agreement with the aforementioned.  I spent 25 minutes counseling the patient face to face. The total time spent in the appointment was 40 minutes and more than 50% was on counseling.     Dawson Hollman, MD 08/24/2013 2:00 PM

## 2013-08-24 NOTE — Telephone Encounter (Signed)
Gave the pt her jan 2015 appt calendar along with the oral contrast for the ct scan. Pt is aware that she will be called from the rad dept with the ct scan appt

## 2013-09-10 ENCOUNTER — Ambulatory Visit (HOSPITAL_BASED_OUTPATIENT_CLINIC_OR_DEPARTMENT_OTHER): Payer: Medicare Other

## 2013-09-10 VITALS — BP 155/87 | HR 92 | Temp 98.9°F | Resp 20

## 2013-09-10 DIAGNOSIS — C20 Malignant neoplasm of rectum: Secondary | ICD-10-CM

## 2013-09-10 DIAGNOSIS — Z452 Encounter for adjustment and management of vascular access device: Secondary | ICD-10-CM

## 2013-09-10 MED ORDER — SODIUM CHLORIDE 0.9 % IJ SOLN
10.0000 mL | INTRAMUSCULAR | Status: DC | PRN
  Administered 2013-09-10: 10 mL via INTRAVENOUS
  Filled 2013-09-10: qty 10

## 2013-09-10 MED ORDER — HEPARIN SOD (PORK) LOCK FLUSH 100 UNIT/ML IV SOLN
500.0000 [IU] | Freq: Once | INTRAVENOUS | Status: AC
  Administered 2013-09-10: 500 [IU] via INTRAVENOUS
  Filled 2013-09-10: qty 5

## 2013-10-14 ENCOUNTER — Encounter: Payer: Self-pay | Admitting: Internal Medicine

## 2013-12-04 ENCOUNTER — Ambulatory Visit (AMBULATORY_SURGERY_CENTER): Payer: Self-pay | Admitting: *Deleted

## 2013-12-04 VITALS — Ht 63.0 in | Wt 206.0 lb

## 2013-12-04 DIAGNOSIS — Z85038 Personal history of other malignant neoplasm of large intestine: Secondary | ICD-10-CM

## 2013-12-04 MED ORDER — PEG-KCL-NACL-NASULF-NA ASC-C 100 G PO SOLR: ORAL | Status: DC

## 2013-12-04 NOTE — Progress Notes (Signed)
Dr. Henrene Pastor reviewed the pt's records and pt is ok for a direct colon- records will be scanned for him for the day of the procedure  No egg or soy allergy

## 2013-12-17 ENCOUNTER — Encounter: Payer: Medicare Other | Admitting: Internal Medicine

## 2013-12-21 ENCOUNTER — Encounter: Payer: Self-pay | Admitting: Hematology and Oncology

## 2013-12-21 NOTE — Progress Notes (Unsigned)
[employer] approved ct cap @ [facility] 12/23/13 [id number]

## 2013-12-22 ENCOUNTER — Telehealth: Payer: Self-pay | Admitting: *Deleted

## 2013-12-22 ENCOUNTER — Telehealth: Payer: Self-pay | Admitting: Hematology and Oncology

## 2013-12-22 NOTE — Telephone Encounter (Signed)
Left VM for pt to return nurse's call.  Informed of CT scan canceled tomorrow morning and r/s to Friday 1/23 due to not being pre certed in time.  Asked her to call back to confirm she got this message.

## 2013-12-22 NOTE — Telephone Encounter (Signed)
per 1/20  pof moved lb from 1/21 to 1/23 prior to ct. lmonvm for pt and gv new d/t for lb 1/23 @ 11:30am

## 2013-12-23 ENCOUNTER — Ambulatory Visit (HOSPITAL_COMMUNITY)
Admission: RE | Admit: 2013-12-23 | Discharge: 2013-12-23 | Disposition: A | Discharge status: Home / Self Care | Payer: Medicare HMO | Source: Ambulatory Visit | Attending: Hematology and Oncology | Admitting: Hematology and Oncology

## 2013-12-23 ENCOUNTER — Other Ambulatory Visit: Payer: Medicare Other

## 2013-12-24 ENCOUNTER — Encounter: Payer: Medicare HMO | Admitting: Internal Medicine

## 2013-12-25 ENCOUNTER — Encounter (INDEPENDENT_AMBULATORY_CARE_PROVIDER_SITE_OTHER): Payer: Self-pay

## 2013-12-25 ENCOUNTER — Other Ambulatory Visit (HOSPITAL_BASED_OUTPATIENT_CLINIC_OR_DEPARTMENT_OTHER): Payer: Medicare HMO

## 2013-12-25 ENCOUNTER — Ambulatory Visit (HOSPITAL_COMMUNITY)
Admission: RE | Admit: 2013-12-25 | Discharge: 2013-12-25 | Disposition: A | Discharge status: Home / Self Care | Payer: Medicare HMO | Source: Ambulatory Visit | Attending: Hematology and Oncology | Admitting: Hematology and Oncology

## 2013-12-25 ENCOUNTER — Ambulatory Visit (HOSPITAL_COMMUNITY): Admission: RE | Admit: 2013-12-25 | Payer: Medicare HMO | Source: Ambulatory Visit

## 2013-12-25 ENCOUNTER — Encounter (HOSPITAL_COMMUNITY): Payer: Self-pay

## 2013-12-25 DIAGNOSIS — M949 Disorder of cartilage, unspecified: Secondary | ICD-10-CM

## 2013-12-25 DIAGNOSIS — C20 Malignant neoplasm of rectum: Secondary | ICD-10-CM

## 2013-12-25 DIAGNOSIS — M899 Disorder of bone, unspecified: Secondary | ICD-10-CM | POA: Insufficient documentation

## 2013-12-25 LAB — COMPREHENSIVE METABOLIC PANEL (CC13)
ALK PHOS: 70 U/L (ref 40–150)
ALT: 21 U/L (ref 0–55)
AST: 17 U/L (ref 5–34)
Albumin: 4.2 g/dL (ref 3.5–5.0)
Anion Gap: 12 mEq/L — ABNORMAL HIGH (ref 3–11)
BILIRUBIN TOTAL: 0.3 mg/dL (ref 0.20–1.20)
BUN: 8.6 mg/dL (ref 7.0–26.0)
CHLORIDE: 105 meq/L (ref 98–109)
CO2: 26 mEq/L (ref 22–29)
CREATININE: 1.1 mg/dL (ref 0.6–1.1)
Calcium: 9.9 mg/dL (ref 8.4–10.4)
Glucose: 138 mg/dl (ref 70–140)
Potassium: 3.8 mEq/L (ref 3.5–5.1)
Sodium: 143 mEq/L (ref 136–145)
Total Protein: 7.7 g/dL (ref 6.4–8.3)

## 2013-12-25 LAB — CBC WITH DIFFERENTIAL/PLATELET
BASO%: 1.5 % (ref 0.0–2.0)
BASOS ABS: 0.1 10*3/uL (ref 0.0–0.1)
EOS%: 2.4 % (ref 0.0–7.0)
Eosinophils Absolute: 0.1 10*3/uL (ref 0.0–0.5)
HEMATOCRIT: 34.7 % — AB (ref 34.8–46.6)
HEMOGLOBIN: 11.3 g/dL — AB (ref 11.6–15.9)
LYMPH%: 29.2 % (ref 14.0–49.7)
MCH: 26.6 pg (ref 25.1–34.0)
MCHC: 32.5 g/dL (ref 31.5–36.0)
MCV: 81.8 fL (ref 79.5–101.0)
MONO#: 0.5 10*3/uL (ref 0.1–0.9)
MONO%: 11 % (ref 0.0–14.0)
NEUT#: 2.6 10*3/uL (ref 1.5–6.5)
NEUT%: 55.9 % (ref 38.4–76.8)
PLATELETS: 319 10*3/uL (ref 145–400)
RBC: 4.24 10*6/uL (ref 3.70–5.45)
RDW: 16.8 % — ABNORMAL HIGH (ref 11.2–14.5)
WBC: 4.6 10*3/uL (ref 3.9–10.3)
lymph#: 1.3 10*3/uL (ref 0.9–3.3)

## 2013-12-25 LAB — FERRITIN CHCC: Ferritin: 99 ng/ml (ref 9–269)

## 2013-12-25 MED ORDER — IOHEXOL 300 MG/ML  SOLN
100.0000 mL | Freq: Once | INTRAMUSCULAR | Status: AC | PRN
  Administered 2013-12-25: 100 mL via INTRAVENOUS

## 2013-12-26 LAB — CEA: CEA: 1 ng/mL (ref 0.0–5.0)

## 2013-12-28 ENCOUNTER — Encounter: Payer: Self-pay | Admitting: Hematology and Oncology

## 2013-12-28 ENCOUNTER — Telehealth: Payer: Self-pay | Admitting: Hematology and Oncology

## 2013-12-28 ENCOUNTER — Ambulatory Visit (HOSPITAL_BASED_OUTPATIENT_CLINIC_OR_DEPARTMENT_OTHER): Payer: Medicare HMO | Admitting: Hematology and Oncology

## 2013-12-28 VITALS — BP 169/87 | HR 74 | Temp 98.4°F | Resp 20 | Ht 63.0 in | Wt 213.8 lb

## 2013-12-28 DIAGNOSIS — K7689 Other specified diseases of liver: Secondary | ICD-10-CM

## 2013-12-28 DIAGNOSIS — D649 Anemia, unspecified: Secondary | ICD-10-CM

## 2013-12-28 DIAGNOSIS — C20 Malignant neoplasm of rectum: Secondary | ICD-10-CM

## 2013-12-28 DIAGNOSIS — R195 Other fecal abnormalities: Secondary | ICD-10-CM

## 2013-12-28 DIAGNOSIS — D1809 Hemangioma of other sites: Secondary | ICD-10-CM

## 2013-12-28 NOTE — Telephone Encounter (Signed)
Mailed the pt her jan 2016 appt schedule

## 2013-12-28 NOTE — Progress Notes (Signed)
[facility] OFFICE PROGRESS NOTE  Patient Care Team: Veita Bland, MD as PCP - General (Family Medicine)  DIAGNOSIS: Rectal cancer, for further management  SUMMARY OF ONCOLOGIC HISTORY: Oncology History   Malignant neoplasm of rectum, cT3N0M0 down staged to ypT2N0M0 after neoadjuvant chemotherapy and radiation therapy   Primary site: Colon and Rectum   Staging method: AJCC 7th Edition   Clinical: Stage I (T2, N0, M0) signed by Ni Gorsuch, MD on 12/28/2013  1:49 PM   Pathologic: Stage I (T2, N0, cM0) signed by Ni Gorsuch, MD on 12/28/2013  1:49 PM   Summary: Stage I (T2, N0, cM0)       Malignant neoplasm of rectum   11/17/2009 Procedure Colonoscopy and rectal biopsy confirmed moderately differentiated adenocarcinoma   11/18/2009 Imaging Transrectal ultrasound place staging T3 lesion   11/18/2009 Imaging Staging CT scan of the chest, abdomen and pelvis showed multiple lesions in the liver as well as paraspinal mass of unknown etiology   12/05/2009 - 04/03/2010 Chemotherapy The patient completed new adjuvant chemotherapy with 5-FU and radiation therapy   12/14/2009 Imaging PET CT scan show faint metabolic activity in the paraspinal mass with no activity in the liver   04/24/2010 Surgery She had surgical resection with negative margins. Final pathology was T2, N0, M0 (down staged by chemoradiation therapy from T3, N0, M0)   11/19/2012 Imaging MRI of the liver confirmed benign hemangioma    INTERVAL HISTORY: Tonya Vincent 63 y.o. female returns for further followup. Her only main complaint is reduced caliber of the stool recently. Denies any sensation of tenesmus She denies any rectal bleeding. Her appetite is stable, denies any recent weight loss. No pain in her abdomen.  I have reviewed the past medical history, past surgical history, social history and family history with the patient and they are unchanged from previous note.  ALLERGIES:  is allergic to antibacterial  hand soap and norgesic.  MEDICATIONS:  Current Outpatient Prescriptions  Medication Sig Dispense Refill  . Olmesartan-Amlodipine-HCTZ (TRIBENZOR) 20-5-12.5 MG TABS Take 1 tablet by mouth daily.      . peg 3350 powder (MOVIPREP) 100 G SOLR Moviprep as directed, no substitutions  1 kit  0  . pioglitazone (ACTOS) 15 MG tablet Take 15 mg by mouth daily.      . Saxagliptin-Metformin (KOMBIGLYZE XR) 04-999 MG TB24 Take 1 tablet by mouth daily.       No current facility-administered medications for this visit.    REVIEW OF SYSTEMS:   Constitutional: Denies fevers, chills or abnormal weight loss Eyes: Denies blurriness of vision Ears, nose, mouth, throat, and face: Denies mucositis or sore throat Respiratory: Denies cough, dyspnea or wheezes Cardiovascular: Denies palpitation, chest discomfort or lower extremity swelling Skin: Denies abnormal skin rashes. Her previous dermatitis is improved Lymphatics: Denies new lymphadenopathy or easy bruising Neurological:Denies numbness, tingling or new weaknesses Behavioral/Psych: Mood is stable, no new changes  All other systems were reviewed with the patient and are negative.  PHYSICAL EXAMINATION: ECOG PERFORMANCE STATUS: 0 - Asymptomatic  Filed Vitals:   12/28/13 1218  BP: 169/87  Pulse: 74  Temp: 98.4 F (36.9 C)  Resp: 20   Filed Weights   12/28/13 1218  Weight: 213 lb 12.8 oz (96.979 kg)    GENERAL:alert, no distress and comfortable SKIN: Her previous dermatitis is improved EYES: normal, Conjunctiva are pink and non-injected, sclera clear OROPHARYNX:no exudate, no erythema and lips, buccal mucosa, and tongue normal  NECK: supple, thyroid normal size, non-tender, without   nodularity LYMPH:  no palpable lymphadenopathy in the cervical, axillary or inguinal LUNGS: clear to auscultation and percussion with normal breathing effort HEART: regular rate & rhythm and no murmurs and no lower extremity edema ABDOMEN:abdomen soft, non-tender  and normal bowel sounds. Well-healed surgical scar Musculoskeletal:no cyanosis of digits and no clubbing  NEURO: alert & oriented x 3 with fluent speech, no focal motor/sensory deficits  LABORATORY DATA:  I have reviewed the data as listed    Component Value Date/Time   NA 143 12/25/2013 1139   NA 139 02/12/2012 1150   NA 148* 04/24/2011 0837   K 3.8 12/25/2013 1139   K 4.2 02/12/2012 1150   K 4.1 04/24/2011 0837   CL 99 11/05/2012 0842   CL 103 02/12/2012 1150   CL 98 04/24/2011 0837   CO2 26 12/25/2013 1139   CO2 22 02/12/2012 1150   CO2 27 04/24/2011 0837   GLUCOSE 138 12/25/2013 1139   GLUCOSE 167* 11/05/2012 0842   GLUCOSE 143* 02/12/2012 1150   GLUCOSE 143* 04/24/2011 0837   BUN 8.6 12/25/2013 1139   BUN 13 02/12/2012 1150   BUN 13 04/24/2011 0837   CREATININE 1.1 12/25/2013 1139   CREATININE 0.97 02/12/2012 1150   CREATININE 0.9 04/24/2011 0837   CALCIUM 9.9 12/25/2013 1139   CALCIUM 10.3 02/12/2012 1150   CALCIUM 9.4 04/24/2011 0837   PROT 7.7 12/25/2013 1139   PROT 7.1 02/12/2012 1150   PROT 7.6 04/24/2011 0837   ALBUMIN 4.2 12/25/2013 1139   ALBUMIN 4.3 02/12/2012 1150   AST 17 12/25/2013 1139   AST 13 02/12/2012 1150   AST 21 04/24/2011 0837   ALT 21 12/25/2013 1139   ALT 12 02/12/2012 1150   ALT 27 04/24/2011 0837   ALKPHOS 70 12/25/2013 1139   ALKPHOS 85 02/12/2012 1150   ALKPHOS 83 04/24/2011 0837   BILITOT 0.30 12/25/2013 1139   BILITOT 0.3 02/12/2012 1150   BILITOT 0.50 04/24/2011 0837   GFRNONAA 50* 04/29/2010 0915   GFRAA  Value: >60        The eGFR has been calculated using the MDRD equation. This calculation has not been validated in all clinical situations. eGFR's persistently <60 mL/min signify possible Chronic Kidney Disease. 04/29/2010 0915    No results found for this basename: SPEP, UPEP,  kappa and lambda light chains    Lab Results  Component Value Date   WBC 4.6 12/25/2013   NEUTROABS 2.6 12/25/2013   HGB 11.3* 12/25/2013   HCT 34.7* 12/25/2013   MCV 81.8 12/25/2013   PLT  319 12/25/2013      Chemistry      Component Value Date/Time   NA 143 12/25/2013 1139   NA 139 02/12/2012 1150   NA 148* 04/24/2011 0837   K 3.8 12/25/2013 1139   K 4.2 02/12/2012 1150   K 4.1 04/24/2011 0837   CL 99 11/05/2012 0842   CL 103 02/12/2012 1150   CL 98 04/24/2011 0837   CO2 26 12/25/2013 1139   CO2 22 02/12/2012 1150   CO2 27 04/24/2011 0837   BUN 8.6 12/25/2013 1139   BUN 13 02/12/2012 1150   BUN 13 04/24/2011 0837   CREATININE 1.1 12/25/2013 1139   CREATININE 0.97 02/12/2012 1150   CREATININE 0.9 04/24/2011 0837      Component Value Date/Time   CALCIUM 9.9 12/25/2013 1139   CALCIUM 10.3 02/12/2012 1150   CALCIUM 9.4 04/24/2011 0837   ALKPHOS 70 12/25/2013 1139   ALKPHOS 85  02/12/2012 1150   ALKPHOS 83 04/24/2011 0837   AST 17 12/25/2013 1139   AST 13 02/12/2012 1150   AST 21 04/24/2011 0837   ALT 21 12/25/2013 1139   ALT 12 02/12/2012 1150   ALT 27 04/24/2011 0837   BILITOT 0.30 12/25/2013 1139   BILITOT 0.3 02/12/2012 1150   BILITOT 0.50 04/24/2011 0837       RADIOGRAPHIC STUDIES: Her most recent CT scan of the chest, abdomen and pelvis were reviewed with the patient which show persistent paraspinal mass, likely benign in nature and persistent liver lesion, again related to hemangioma which is benign I have personally reviewed the radiological images as listed and agreed with the findings in the report.  ASSESSMENT & PLAN:  #1 history of rectal cancer She has no evidence of disease recurrence. I recommend yearly visit from now with history, physical examination, blood work and CT scan once a year She needs to followup with colonoscopy which has been scheduled for next month #2 reduce caliber of stool I wonder if this could be due to the stricture in her rectal area from previous treatment She has appointment and followup colonoscopy next month. #3 paraspinal mass #4 liver lesions These are all benign in nature. No further workup is needed. #5 anemia This is likely anemia of  chronic disease. The patient denies recent history of bleeding such as epistaxis, hematuria or hematochezia. She is asymptomatic from the anemia. We will observe for now.  She does not require transfusion now.  #6 venous access If her colonoscopy is negative, I recommend the patient to call me I will would put in order to remove her Infuse-a-Port  Orders Placed This Encounter  Procedures  . CT Chest W Contrast    Standing Status: Future     Number of Occurrences:      Standing Expiration Date: 02/27/2015    Order Specific Question:  Reason for Exam (SYMPTOM  OR DIAGNOSIS REQUIRED)    Answer:  Hx rectal ca, r/o recurrence    Order Specific Question:  Preferred imaging location?    Answer:  [facility]  . CT Abdomen Pelvis W Contrast    Standing Status: Future     Number of Occurrences:      Standing Expiration Date: 03/30/2015    Order Specific Question:  Reason for Exam (SYMPTOM  OR DIAGNOSIS REQUIRED)    Answer:  Hx rectal ca, r/o recurrence    Order Specific Question:  Preferred imaging location?    Answer:  [facility]  . CBC with Differential    Standing Status: Future     Number of Occurrences:      Standing Expiration Date: 12/28/2014  . Comprehensive metabolic panel    Standing Status: Future     Number of Occurrences:      Standing Expiration Date: 12/28/2014  . CEA    Standing Status: Future     Number of Occurrences:      Standing Expiration Date: 12/28/2014   All questions were answered. The patient knows to call the clinic with any problems, questions or concerns. No barriers to learning was detected. I spent 41 minutes counseling the patient face to face. The total time spent in the appointment was 55 minutes and more than 50% was on counseling and review of test results     GORSUCH, NI, MD 12/28/2013 1:57 PM    

## 2013-12-30 ENCOUNTER — Other Ambulatory Visit (HOSPITAL_COMMUNITY): Payer: Medicare HMO

## 2014-01-05 ENCOUNTER — Encounter: Payer: Self-pay | Admitting: Internal Medicine

## 2014-01-08 ENCOUNTER — Ambulatory Visit (AMBULATORY_SURGERY_CENTER): Payer: Medicare HMO | Admitting: Internal Medicine

## 2014-01-08 ENCOUNTER — Encounter: Payer: Self-pay | Admitting: Internal Medicine

## 2014-01-08 VITALS — BP 135/77 | HR 65 | Temp 96.5°F | Resp 14 | Ht 63.0 in | Wt 207.0 lb

## 2014-01-08 DIAGNOSIS — Z85038 Personal history of other malignant neoplasm of large intestine: Secondary | ICD-10-CM

## 2014-01-08 DIAGNOSIS — D126 Benign neoplasm of colon, unspecified: Secondary | ICD-10-CM

## 2014-01-08 HISTORY — DX: Benign neoplasm of colon, unspecified: D12.6

## 2014-01-08 LAB — GLUCOSE, CAPILLARY
GLUCOSE-CAPILLARY: 125 mg/dL — AB (ref 70–99)
Glucose-Capillary: 118 mg/dL — ABNORMAL HIGH (ref 70–99)

## 2014-01-08 MED ORDER — SODIUM CHLORIDE 0.9 % IV SOLN: 500.0000 mL | INTRAVENOUS | Status: DC

## 2014-01-08 NOTE — Patient Instructions (Signed)
YOU HAD AN ENDOSCOPIC PROCEDURE TODAY AT THE [facility]: Refer to the procedure report that was given to you for any specific questions about what was found during the examination.  If the procedure report does not answer your questions, please call your gastroenterologist to clarify.  If you requested that your care partner not be given the details of your procedure findings, then the procedure report has been included in a sealed envelope for you to review at your convenience later.  YOU SHOULD EXPECT: Some feelings of bloating in the abdomen. Passage of more gas than usual.  Walking can help get rid of the air that was put into your GI tract during the procedure and reduce the bloating. If you had a lower endoscopy (such as a colonoscopy or flexible sigmoidoscopy) you may notice spotting of blood in your stool or on the toilet paper. If you underwent a bowel prep for your procedure, then you may not have a normal bowel movement for a few days.  DIET: Your first meal following the procedure should be a light meal and then it is ok to progress to your normal diet.  A half-sandwich or bowl of soup is an example of a good first meal.  Heavy or fried foods are harder to digest and may make you feel nauseous or bloated.  Likewise meals heavy in dairy and vegetables can cause extra gas to form and this can also increase the bloating.  Drink plenty of fluids but you should avoid alcoholic beverages for 24 hours.  ACTIVITY: Your care partner should take you home directly after the procedure.  You should plan to take it easy, moving slowly for the rest of the day.  You can resume normal activity the day after the procedure however you should NOT DRIVE or use heavy machinery for 24 hours (because of the sedation medicines used during the test).    SYMPTOMS TO REPORT IMMEDIATELY: A gastroenterologist can be reached at any hour.  During normal business hours, 8:30 AM to 5:00 PM Monday through Friday,  call [phone].  After hours and on weekends, please call the GI answering service at [phone] who will take a message and have the physician on call contact you.   Following lower endoscopy (colonoscopy or flexible sigmoidoscopy):  Excessive amounts of blood in the stool  Significant tenderness or worsening of abdominal pains  Swelling of the abdomen that is new, acute  Fever of 100F or higher    FOLLOW UP: If any biopsies were taken you will be contacted by phone or by letter within the next 1-3 weeks.  Call your gastroenterologist if you have not heard about the biopsies in 3 weeks.  Our staff will call the home number listed on your records the next business day following your procedure to check on you and address any questions or concerns that you may have at that time regarding the information given to you following your procedure. This is a courtesy call and so if there is no answer at the home number and we have not heard from you through the emergency physician on call, we will assume that you have returned to your regular daily activities without incident.  SIGNATURES/CONFIDENTIALITY: You and/or your care partner have signed paperwork which will be entered into your electronic medical record.  These signatures attest to the fact that that the information above on your After Visit Summary has been reviewed and is understood.  Full responsibility of the confidentiality   this discharge information lies with you and/or your care-partner.  Polyp information given.  Next colonoscopy 3 years-2018. 

## 2014-01-08 NOTE — Progress Notes (Signed)
Called to room to assist during endoscopic procedure.  Patient ID and intended procedure confirmed with present staff. Received instructions for my participation in the procedure from the performing physician.  

## 2014-01-08 NOTE — Op Note (Signed)
[facility]  [employer]. [place], [address]   COLONOSCOPY PROCEDURE REPORT  PATIENT: Tonya Vincent, Tonya Vincent  MR#: [id number] BIRTHDATE: 1951-02-09 , 62  yrs. old GENDER: Female ENDOSCOPIST: Eustace Quail, MD REFERRED AL:PFXTK Criss Rosales, M.D. PROCEDURE DATE:  01/08/2014 PROCEDURE:   Colonoscopy with snare polypectomy x 1 First Screening Colonoscopy - Avg.  risk and is 50 yrs.  old or older - No.  Prior Negative Screening - Now for repeat screening. N/A  History of Adenoma - Now for follow-up colonoscopy & has been > or = to 3 yrs.  N/A  Polyps Removed Today? Yes. ASA CLASS:   Class II INDICATIONS:High risk patient with personal history of rectal cancer.   Dx Dr Benson Norway 11-2009. subsequent neoadjuvant therapy / surgery 2011 MEDICATIONS: MAC sedation, administered by CRNA and propofol (Diprivan) 300mg  IV  DESCRIPTION OF PROCEDURE:   After the risks benefits and alternatives of the procedure were thoroughly explained, informed consent was obtained.  A digital rectal exam revealed no abnormalities of the rectum.   The LB [id number]  endoscope was introduced through the anus and advanced to the cecum, which was identified by both the appendix and ileocecal valve. No adverse events experienced.   The quality of the prep was excellent, using MoviPrep  The instrument was then slowly withdrawn as the colon was fully examined.   COLON FINDINGS: A diminutive polyp was found in the descending colon.  A polypectomy was performed with a cold snare.  The resection was complete and the polyp tissue was completely retrieved.   There was evidence of a prior low anterior surgical anastomosis. The colon was otherwise normal. Retroflexion was not performed due to a narrow rectal vault. The time to cecum=2 minutes 42 seconds.  Withdrawal time=8 minutes 36 seconds.  The scope was withdrawn and the procedure completed. COMPLICATIONS: There were no complications.  ENDOSCOPIC  IMPRESSION: 1.   Diminutive polyp was found in the descending colon; polypectomy was performed with a cold snare 2.   There was evidence of a prior low anterior surgical anastomosis . Otherwise normal colon  RECOMMENDATIONS: 1. Repeat Colonoscopy in 3 years.   eSigned:  Eustace Quail, MD 01/08/2014 8:46 AM   cc: [name], MD and The Patient

## 2014-01-08 NOTE — Progress Notes (Signed)
Report to pacu rn, vss, bbs=clear 

## 2014-01-11 ENCOUNTER — Telehealth: Payer: Self-pay | Admitting: *Deleted

## 2014-01-11 NOTE — Telephone Encounter (Signed)
No answer, left message to call if questions or concerns. 

## 2014-01-12 ENCOUNTER — Encounter: Payer: Self-pay | Admitting: Internal Medicine

## 2014-12-21 ENCOUNTER — Ambulatory Visit (HOSPITAL_COMMUNITY)
Admission: RE | Admit: 2014-12-21 | Discharge: 2014-12-21 | Disposition: A | Discharge status: Home / Self Care | Payer: 59 | Source: Ambulatory Visit | Attending: Hematology and Oncology | Admitting: Hematology and Oncology

## 2014-12-21 ENCOUNTER — Encounter (HOSPITAL_COMMUNITY): Payer: Self-pay

## 2014-12-21 ENCOUNTER — Other Ambulatory Visit (HOSPITAL_BASED_OUTPATIENT_CLINIC_OR_DEPARTMENT_OTHER): Payer: 59

## 2014-12-21 DIAGNOSIS — C20 Malignant neoplasm of rectum: Secondary | ICD-10-CM | POA: Diagnosis present

## 2014-12-21 LAB — CBC WITH DIFFERENTIAL/PLATELET
BASO%: 0.9 % (ref 0.0–2.0)
BASOS ABS: 0 10*3/uL (ref 0.0–0.1)
EOS%: 4 % (ref 0.0–7.0)
Eosinophils Absolute: 0.2 10*3/uL (ref 0.0–0.5)
HEMATOCRIT: 36 % (ref 34.8–46.6)
HGB: 11.1 g/dL — ABNORMAL LOW (ref 11.6–15.9)
LYMPH#: 1.4 10*3/uL (ref 0.9–3.3)
LYMPH%: 30.6 % (ref 14.0–49.7)
MCH: 24.8 pg — ABNORMAL LOW (ref 25.1–34.0)
MCHC: 30.7 g/dL — ABNORMAL LOW (ref 31.5–36.0)
MCV: 80.9 fL (ref 79.5–101.0)
MONO#: 0.5 10*3/uL (ref 0.1–0.9)
MONO%: 10.6 % (ref 0.0–14.0)
NEUT#: 2.5 10*3/uL (ref 1.5–6.5)
NEUT%: 53.9 % (ref 38.4–76.8)
Platelets: 319 10*3/uL (ref 145–400)
RBC: 4.45 10*6/uL (ref 3.70–5.45)
RDW: 17.9 % — ABNORMAL HIGH (ref 11.2–14.5)
WBC: 4.6 10*3/uL (ref 3.9–10.3)

## 2014-12-21 LAB — COMPREHENSIVE METABOLIC PANEL (CC13)
ALBUMIN: 3.8 g/dL (ref 3.5–5.0)
ALT: 15 U/L (ref 0–55)
AST: 14 U/L (ref 5–34)
Alkaline Phosphatase: 75 U/L (ref 40–150)
Anion Gap: 9 mEq/L (ref 3–11)
BUN: 9 mg/dL (ref 7.0–26.0)
CO2: 27 meq/L (ref 22–29)
Calcium: 9 mg/dL (ref 8.4–10.4)
Chloride: 106 mEq/L (ref 98–109)
Creatinine: 1.1 mg/dL (ref 0.6–1.1)
EGFR: 62 mL/min/{1.73_m2} — ABNORMAL LOW (ref >90)
GLUCOSE: 125 mg/dL (ref 70–140)
Potassium: 4 mEq/L (ref 3.5–5.1)
Sodium: 142 mEq/L (ref 136–145)
Total Protein: 7.2 g/dL (ref 6.4–8.3)

## 2014-12-21 LAB — CEA: CEA: 0.8 ng/mL (ref 0.0–5.0)

## 2014-12-21 MED ORDER — IOHEXOL 300 MG/ML  SOLN
100.0000 mL | Freq: Once | INTRAMUSCULAR | Status: AC | PRN
  Administered 2014-12-21: 100 mL via INTRAVENOUS

## 2014-12-27 ENCOUNTER — Telehealth: Payer: Self-pay | Admitting: Hematology and Oncology

## 2014-12-27 NOTE — Telephone Encounter (Signed)
s.w. pt and confirmed appts.....pt ok and aware °

## 2014-12-29 ENCOUNTER — Telehealth: Payer: Self-pay

## 2014-12-29 ENCOUNTER — Telehealth: Payer: Self-pay | Admitting: Hematology and Oncology

## 2014-12-29 ENCOUNTER — Ambulatory Visit (HOSPITAL_BASED_OUTPATIENT_CLINIC_OR_DEPARTMENT_OTHER): Payer: 59 | Admitting: Hematology and Oncology

## 2014-12-29 ENCOUNTER — Encounter: Payer: Self-pay | Admitting: Hematology and Oncology

## 2014-12-29 VITALS — BP 156/74 | HR 72 | Temp 97.8°F | Resp 18 | Ht 63.0 in | Wt 208.1 lb

## 2014-12-29 DIAGNOSIS — D638 Anemia in other chronic diseases classified elsewhere: Secondary | ICD-10-CM | POA: Insufficient documentation

## 2014-12-29 DIAGNOSIS — R222 Localized swelling, mass and lump, trunk: Secondary | ICD-10-CM | POA: Insufficient documentation

## 2014-12-29 DIAGNOSIS — R29818 Other symptoms and signs involving the nervous system: Secondary | ICD-10-CM

## 2014-12-29 DIAGNOSIS — C20 Malignant neoplasm of rectum: Secondary | ICD-10-CM

## 2014-12-29 HISTORY — DX: Localized swelling, mass and lump, trunk: R22.2

## 2014-12-29 NOTE — Assessment & Plan Note (Signed)
She is complaining of neuropathic pain which is suspect is due to nerve root compression from the paraspinal mass. Recommend cardiothoracic evaluation.

## 2014-12-29 NOTE — Assessment & Plan Note (Signed)
Clinically, she has no signs of cancer recurrence. However, she complained of change in the caliber of the stool. CT scan showed fecal loading. Recommend referral back to gastroenterologist for further assessment.

## 2014-12-29 NOTE — Telephone Encounter (Signed)
-----   Message from Irene Shipper, MD sent at 12/29/2014 10:38 AM EST ----- Regarding: FW: mutual [name], See below message. This patient had normal postoperative colonoscopy just 11 months ago. Have her see one of the extenders. Thanks. Dr. Henrene Pastor ----- Message -----    From: [name], MD    Sent: 12/29/2014  10:10 AM      To: Irene Shipper, MD Subject: mutual patient                                 This patient has change in bowel habits and change in the caliber of stools. i felt that could be due to radiation but cannot be sure. I would appreciate if you can see her back soon.  Thanks, Ni

## 2014-12-29 NOTE — Progress Notes (Signed)
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 after neoadjuvant chemotherapy and radiation therapy   Primary site: Colon and Rectum   Staging method: AJCC 7th Edition   Clinical: Stage I (T2, N0, M0) signed by [name], MD on 12/28/2013  1:49 PM   Pathologic: Stage I (T2, N0, cM0) signed by [name], MD on 12/28/2013  1:49 PM   Summary: Stage I (T2, N0, cM0)       Malignant neoplasm of rectum   11/17/2009 Procedure Colonoscopy and rectal biopsy confirmed moderately differentiated adenocarcinoma   11/18/2009 Imaging Transrectal ultrasound place staging T3 lesion   11/18/2009 Imaging Staging CT scan of the chest, abdomen and pelvis showed multiple lesions in the liver as well as paraspinal mass of unknown etiology   12/05/2009 - 04/03/2010 Chemotherapy The patient completed new adjuvant chemotherapy with 5-FU and radiation therapy   12/14/2009 Imaging PET CT scan show faint metabolic activity in the paraspinal mass with no activity in the liver   04/24/2010 Surgery She had surgical resection with negative margins. Final pathology was T2, N0, M0 (down staged by chemoradiation therapy from T3, N0, M0)   11/19/2012 Imaging MRI of the liver confirmed benign hemangioma   01/08/2014 Procedure Colonoscopy and biopsy was negative   12/21/2014 Imaging CT scan showed no recurrence of colon cancer. Incidentally, paraspinal mass is slightly larger.    INTERVAL HISTORY: Please see below for problem oriented charting. She feels well. She is complaining of intermittent neuropathic pain radiating down to her right chest wall. It is rated between 3 out of 10 pain to 8 out of 10 pain. It is aggravated by change in position. Over the last 6 months, she noted that her stool caliber has changed. She denies any melena or  hematochezia. No recent weight loss.  REVIEW OF SYSTEMS:   Constitutional: Denies fevers, chills or abnormal weight loss Eyes: Denies blurriness of vision Ears, nose, mouth, throat, and face: Denies mucositis or sore throat Respiratory: Denies cough, dyspnea or wheezes Cardiovascular: Denies palpitation, chest discomfort or lower extremity swelling Skin: Denies abnormal skin rashes Lymphatics: Denies new lymphadenopathy or easy bruising Neurological:Denies numbness, tingling or new weaknesses Behavioral/Psych: Mood is stable, no new changes  All other systems were reviewed with the patient and are negative.  I have reviewed the past medical history, past surgical history, social history and family history with the patient and they are unchanged from previous note.  ALLERGIES:  is allergic to antibacterial hand soap and norgesic.  MEDICATIONS:  Current Outpatient Prescriptions  Medication Sig Dispense Refill  . Olmesartan-Amlodipine-HCTZ (TRIBENZOR) 20-5-12.5 MG TABS Take 1 tablet by mouth daily.    . pioglitazone (ACTOS) 15 MG tablet Take 15 mg by mouth daily.    . Saxagliptin-Metformin (KOMBIGLYZE XR) 04-999 MG TB24 Take 1 tablet by mouth daily.     No current facility-administered medications for this visit.    PHYSICAL EXAMINATION: ECOG PERFORMANCE STATUS: 1 - Symptomatic but completely ambulatory  Filed Vitals:   12/29/14 0944  BP: 156/74  Pulse: 72  Temp: 97.8 F (36.6 C)  Resp: 18   Filed Weights   12/29/14 0944  Weight: 208 lb 1.6 oz (94.394 kg)    GENERAL:alert, no distress and comfortable SKIN: skin color, texture, turgor are normal, no rashes or significant lesions EYES: normal, Conjunctiva are pink  and non-injected, sclera clear OROPHARYNX:no exudate, no erythema and lips, buccal mucosa, and tongue normal  NECK: supple, thyroid normal size, non-tender, without nodularity LYMPH:  no palpable lymphadenopathy in the cervical, axillary or inguinal LUNGS: clear  to auscultation and percussion with normal breathing effort HEART: regular rate & rhythm and no murmurs and no lower extremity edema ABDOMEN:abdomen soft, non-tender and normal bowel sounds Musculoskeletal:no cyanosis of digits and no clubbing  NEURO: alert & oriented x 3 with fluent speech, no focal motor/sensory deficits  LABORATORY DATA:  I have reviewed the data as listed    Component Value Date/Time   NA 142 12/21/2014 0855   NA 139 02/12/2012 1150   NA 148* 04/24/2011 0837   K 4.0 12/21/2014 0855   K 4.2 02/12/2012 1150   K 4.1 04/24/2011 0837   CL 99 11/05/2012 0842   CL 103 02/12/2012 1150   CL 98 04/24/2011 0837   CO2 27 12/21/2014 0855   CO2 22 02/12/2012 1150   CO2 27 04/24/2011 0837   GLUCOSE 125 12/21/2014 0855   GLUCOSE 167* 11/05/2012 0842   GLUCOSE 143* 02/12/2012 1150   GLUCOSE 143* 04/24/2011 0837   BUN 9.0 12/21/2014 0855   BUN 13 02/12/2012 1150   BUN 13 04/24/2011 0837   CREATININE 1.1 12/21/2014 0855   CREATININE 0.97 02/12/2012 1150   CREATININE 0.9 04/24/2011 0837   CALCIUM 9.0 12/21/2014 0855   CALCIUM 10.3 02/12/2012 1150   CALCIUM 9.4 04/24/2011 0837   PROT 7.2 12/21/2014 0855   PROT 7.1 02/12/2012 1150   PROT 7.6 04/24/2011 0837   ALBUMIN 3.8 12/21/2014 0855   ALBUMIN 4.3 02/12/2012 1150   AST 14 12/21/2014 0855   AST 13 02/12/2012 1150   AST 21 04/24/2011 0837   ALT 15 12/21/2014 0855   ALT 12 02/12/2012 1150   ALT 27 04/24/2011 0837   ALKPHOS 75 12/21/2014 0855   ALKPHOS 85 02/12/2012 1150   ALKPHOS 83 04/24/2011 0837   BILITOT <0.20 12/21/2014 0855   BILITOT 0.3 02/12/2012 1150   BILITOT 0.50 04/24/2011 0837   GFRNONAA 50* 04/29/2010 0915   GFRAA  04/29/2010 0915    >60        The eGFR has been calculated using the MDRD equation. This calculation has not been validated in all clinical situations. eGFR's persistently <60 mL/min signify possible Chronic Kidney Disease.    No results found for: SPEP, UPEP  Lab Results   Component Value Date   WBC 4.6 12/21/2014   NEUTROABS 2.5 12/21/2014   HGB 11.1* 12/21/2014   HCT 36.0 12/21/2014   MCV 80.9 12/21/2014   PLT 319 12/21/2014      Chemistry      Component Value Date/Time   NA 142 12/21/2014 0855   NA 139 02/12/2012 1150   NA 148* 04/24/2011 0837   K 4.0 12/21/2014 0855   K 4.2 02/12/2012 1150   K 4.1 04/24/2011 0837   CL 99 11/05/2012 0842   CL 103 02/12/2012 1150   CL 98 04/24/2011 0837   CO2 27 12/21/2014 0855   CO2 22 02/12/2012 1150   CO2 27 04/24/2011 0837   BUN 9.0 12/21/2014 0855   BUN 13 02/12/2012 1150   BUN 13 04/24/2011 0837   CREATININE 1.1 12/21/2014 0855   CREATININE 0.97 02/12/2012 1150   CREATININE 0.9 04/24/2011 0837      Component Value Date/Time   CALCIUM 9.0 12/21/2014 0855   CALCIUM 10.3 02/12/2012 1150   CALCIUM 9.4  04/24/2011 0837   ALKPHOS 75 12/21/2014 0855   ALKPHOS 85 02/12/2012 1150   ALKPHOS 83 04/24/2011 0837   AST 14 12/21/2014 0855   AST 13 02/12/2012 1150   AST 21 04/24/2011 0837   ALT 15 12/21/2014 0855   ALT 12 02/12/2012 1150   ALT 27 04/24/2011 0837   BILITOT <0.20 12/21/2014 0855   BILITOT 0.3 02/12/2012 1150   BILITOT 0.50 04/24/2011 0837       RADIOGRAPHIC STUDIES: I reviewed the imaging study with the patient I have personally reviewed the radiological images as listed and agreed with the findings in the report.   ASSESSMENT & PLAN:  Malignant neoplasm of rectum Clinically, she has no signs of cancer recurrence. However, she complained of change in the caliber of the stool. CT scan showed fecal loading. Recommend referral back to gastroenterologist for further assessment.   Anemia in chronic illness This is likely anemia of chronic disease. The patient denies recent history of bleeding such as epistaxis, hematuria or hematochezia. She is asymptomatic from the anemia. We will observe for now.    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 Recommend cardiothoracic evaluation.    Orders Placed This Encounter  Procedures  . CBC with Differential/Platelet    Standing Status: Future     Number of Occurrences:      Standing Expiration Date: 02/02/2016  . Comprehensive metabolic panel    Standing Status: Future     Number of Occurrences:      Standing Expiration Date: 02/02/2016  . CEA    Standing Status: Future     Number of Occurrences:      Standing Expiration Date: 02/02/2016  . Ambulatory referral to Cardiothoracic Surgery    Referral Priority:  Routine    Referral Type:  Surgical    Referral Reason:  Specialty Services Required    Referred to Provider:  Grace Isaac, MD    Requested Specialty:  Cardiothoracic Surgery    Number of Visits Requested:  1   All questions were answered. The patient knows to call the clinic with any problems, questions or concerns. No barriers to learning was detected. I spent 30 minutes counseling the patient face to face. The total time spent in the appointment was 40 minutes and more than 50% was on counseling and review of test results     [facility], [place], MD 12/29/2014 11:44 AM

## 2014-12-29 NOTE — Assessment & Plan Note (Signed)
This is likely anemia of chronic disease. The patient denies recent history of bleeding such as epistaxis, hematuria or hematochezia. She is asymptomatic from the anemia. We will observe for now.  

## 2014-12-29 NOTE — Telephone Encounter (Signed)
Left message for pt to call back  °

## 2014-12-29 NOTE — Telephone Encounter (Signed)
Pt scheduled to see Alonza Bogus PA 12/31/14. Pt aware of appt.

## 2014-12-29 NOTE — Telephone Encounter (Signed)
Gave avs & calendar for January for 2017.

## 2014-12-30 ENCOUNTER — Institutional Professional Consult (permissible substitution) (INDEPENDENT_AMBULATORY_CARE_PROVIDER_SITE_OTHER): Payer: 59 | Admitting: Cardiothoracic Surgery

## 2014-12-30 ENCOUNTER — Encounter: Payer: Self-pay | Admitting: Cardiothoracic Surgery

## 2014-12-30 VITALS — BP 161/81 | HR 74 | Resp 18 | Ht 63.0 in | Wt 208.0 lb

## 2014-12-30 DIAGNOSIS — R29818 Other symptoms and signs involving the nervous system: Secondary | ICD-10-CM

## 2014-12-30 DIAGNOSIS — C20 Malignant neoplasm of rectum: Secondary | ICD-10-CM

## 2014-12-30 DIAGNOSIS — R222 Localized swelling, mass and lump, trunk: Secondary | ICD-10-CM

## 2014-12-30 NOTE — Progress Notes (Signed)
[address]       [name] [address]             [id number]                    Bennetta F Bovee Rayle Medical Record #[id number] Date of Birth: [birth year]  Referring: [name], MD Primary Care: Elyn Peers, MD  Chief Complaint:    Chief Complaint  Patient presents with  . Referral    to assess paraspinal mass. h/o rectal cancer    History of Present Illness:    Tonya Vincent 64 y.o. female is seen in the office  today for evaluation of right paraspinal mass found incidentally on CT scan in 2011 when the patient was being evaluated for rectal carcinoma. A recent CT scan of the chest and abdomen shows no evidence of recurrent rectal cancer. There has been some slight enlargement of the right appears bilateral mass since 2011. The patient has noted vague right lateral chest wall pain approximately fifth to sixth intercostal space, nonradiating not dermatomal. She has long-standing paresthesias in her hands and feet. She is a known diabetic.     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 after neoadjuvant chemotherapy and radiation therapy   Primary site: Colon and Rectum   Staging method: AJCC 7th Edition   Clinical: Stage I (T2, N0, M0) signed by [name], MD on 12/28/2013  1:49 PM   Pathologic: Stage I (T2, N0, cM0) signed by [name], MD on 12/28/2013  1:49 PM   Summary: Stage I (T2, N0, cM0)       Malignant neoplasm of rectum   11/17/2009 Procedure Colonoscopy and rectal biopsy confirmed moderately differentiated adenocarcinoma   11/18/2009 Imaging Transrectal ultrasound place staging T3 lesion   11/18/2009 Imaging Staging CT scan of the chest, abdomen and pelvis  showed multiple lesions in the liver as well as paraspinal mass of unknown etiology   12/05/2009 - 04/03/2010 Chemotherapy The patient completed new adjuvant chemotherapy with 5-FU and radiation therapy   12/14/2009 Imaging PET CT scan show faint metabolic activity in the paraspinal mass with no activity in the liver   04/24/2010 Surgery She had surgical resection with negative margins. Final pathology was T2, N0, M0 (down staged by chemoradiation therapy from T3, N0, M0)   11/19/2012 Imaging MRI of the liver confirmed benign hemangioma   01/08/2014 Procedure Colonoscopy and biopsy was negative   12/21/2014 Imaging CT scan showed no recurrence of colon cancer. Incidentally, paraspinal mass is slightly larger.   Current Activity/ Functional Status:  Patient is independent with mobility/ambulation, transfers, ADL's, IADL's.   Zubrod Score: At the time of surgery this patient's most appropriate activity status/level should be described as: [x]     0    Normal activity, no symptoms []     1    Restricted in physical strenuous activity but ambulatory, able to do out light work []     2    Ambulatory and capable of self care, unable to do work activities, up and about               >  50 % of waking hours                              []     3    Only limited self care, in bed greater than 50% of waking hours []     4    Completely disabled, no self care, confined to bed or chair []     5    Moribund   Past Medical History  Diagnosis Date  . rectal ca` dx'd 11/16/09    xrt comp 01/2010; chemo comp 01/2011  . Diabetes mellitus   . Hypertension   . Malignant neoplasm of rectum 10/04/2011  . Anemia   . Anxiety   . Depression   . Heart murmur   . Paraspinal mass 12/29/2014    Past Surgical History  Procedure Laterality Date  . Colon surgery N/A 04/24/2010  . Hemorrhoid surgery N/A 1980  . Abdominal hysterectomy      for uterine fibroids  . Colonoscopy    . Hand surgery      carpal tunnel- left  .  Portacath placement      2011    Family History  Problem Relation Age of Onset  . Cancer Father 25    leukemia  . Colon cancer Neg Hx   . Esophageal cancer Neg Hx   . Stomach cancer Neg Hx   . Rectal cancer Neg Hx     History   Social History  . Marital Status: Single    Spouse Name: N/A    Number of Children: N/A  . Years of Education: N/A   Occupational History  . Not on file.   Social History Main Topics  . Smoking status: Former Smoker -- 0.50 packs/day for 35 years    Types: Cigarettes, Cigars    Quit date: 12/03/2009  . Smokeless tobacco: Never Used  . Alcohol Use: Yes     Comment: socially  . Drug Use: No  . Sexual Activity: Not on file     History  Smoking status  . Former Smoker -- 0.50 packs/day for 35 years  . Types: Cigarettes, Cigars  . Quit date: 12/03/2009  Smokeless tobacco  . Never Used    History  Alcohol Use  . Yes    Comment: socially     Allergies  Allergen Reactions  . Antibacterial Hand Soap [Triclosan] Dermatitis  . Norgesic [Orphenadrine-Aspirin-Caffeine]     hives    Current Outpatient Prescriptions  Medication Sig Dispense Refill  . Saxagliptin-Metformin (KOMBIGLYZE XR) 04-999 MG TB24 Take 1 tablet by mouth daily.     No current facility-administered medications for this visit.      Review of Systems:     Cardiac Review of Systems: Y or N  Chest Pain [  y  ]  Resting SOB [ n  ] Exertional SOB  Blue.Reese  ]  Orthopnea Florencio.Farrier  ]   Pedal Edema Florencio.Farrier   ]    Palpitations Florencio.Farrier  ] Syncope  [ n ]   Presyncope [ n  ]  General Review of Systems: [Y] = yes [  ]=no Constitional: recent weight change [ 10 lbs increase 6 months ];  Wt loss over the last 3 months [ n  ] anorexia [  ]; fatigue [  ]; nausea [ n ]; night sweats [  ]; fever [  ]; or chills [  ];  Dental: poor dentition[  ]; Last Dentist visit:   Eye : blurred vision [  ]; diplopia [   ]; vision changes [  ];  Amaurosis fugax[  ]; Resp: cough [n  ];  wheezing[ n ];   hemoptysis[n  ]; shortness of breath[ n ]; paroxysmal nocturnal dyspnea[ n ]; dyspnea on exertion[y  ]; or orthopnea[  ];  GI:  gallstones[  ], vomiting[  ];  dysphagia[  ]; melena[  ];  hematochezia [  ]; heartburn[  ];   Hx of  Colonoscopy[  ]; GU: kidney stones [  ]; hematuria[  ];   dysuria [  ];  nocturia[  ];  history of     obstruction [  ]; urinary frequency [  ]             Skin: rash, swelling[  ];, hair loss[  ];  peripheral edema[  ];  or itching[  ]; Musculosketetal: myalgias[n ];  joint swelling[  ];  joint erythema[  ];  joint pain[n  ];  back pain[ n ];  Heme/Lymph: bruising[  ];  bleeding[  ];  anemia[  ];  Neuro: TIA[  ];  headaches[  ];  stroke[  ];  vertigo[  ];  seizures[  ];   paresthesias[ y ];  difficulty walking[ n ];  Psych:depression[  ]; anxiety[  ];  Endocrine: diabetes[y  ];  thyroid dysfunction[ n ];  Immunizations: Flu up to date [ y ]; Pneumococcal up to date [ n ];  Other:  Physical Exam: BP 161/81 mmHg  Pulse 74  Resp 18  Ht 5\' 3"  (1.6 m)  Wt 208 lb (94.348 kg)  BMI 36.85 kg/m2  SpO2 97%  PHYSICAL EXAMINATION: General appearance: alert, cooperative and appears stated age Head: Normocephalic, without obvious abnormality, atraumatic Neck: no adenopathy, no carotid bruit, no JVD, supple, symmetrical, trachea midline and thyroid not enlarged, symmetric, no tenderness/mass/nodules Lymph nodes: Cervical, supraclavicular, and axillary nodes normal. Resp: clear to auscultation bilaterally Back: symmetric, no curvature. ROM normal. No CVA tenderness. Cardio: regular rate and rhythm, S1, S2 normal, no murmur, click, rub or gallop and has history of m i do not appreciated m on exam today GI: soft, non-tender; bowel sounds normal; no masses,  no organomegaly Extremities: extremities normal, atraumatic, no cyanosis or edema and Homans sign is negative, no sign of DVT Neurologic: Grossly normal  Diagnostic Studies & Laboratory data:     Recent Radiology  Findings:   Ct Chest W Contrast  12/21/2014   CLINICAL DATA:  Restaging rectal carcinoma  EXAM: CT CHEST, ABDOMEN, AND PELVIS WITH CONTRAST  TECHNIQUE: Multidetector CT imaging of the chest, abdomen and pelvis was performed following the standard protocol during bolus administration of intravenous contrast.  CONTRAST:  135mL OMNIPAQUE IOHEXOL 300 MG/ML  SOLN  COMPARISON:  12/25/13  FINDINGS: CT CHEST FINDINGS  Mediastinum: The heart size is normal. There is no pericardial effusion identified. The trachea is patent appears midline. Normal appearance of the esophagus. No mediastinal or hilar adenopathy identified. Paraspinal mass within the right upper hemi thorax today measures 3.5 x 2.0 cm, image 14/series 2. On the exam from 09/04/2010 this measured 2.7 x 1.4 cm.  Lungs/Pleura: Small right pleural effusion is slightly increased in volume from previous exam. No airspace consolidation or atelectasis. Stable 3 mm nodule within the periphery of the right middle lobe. 5 mm left upper lobe nodule is also unchanged.  Musculoskeletal: There are no aggressive lytic or sclerotic  bone lesions identified.  CT ABDOMEN AND PELVIS FINDINGS  Hepatobiliary: Mild hepatic steatosis. Enhancing structure within the right hepatic lobe measures 1 cm, image 53/ series 2. Previously 1.3 cm. The adjacent enhancing structure measures 1.1 cm, image 54/ series 2. Previously 1.5 cm. Stable cyst in the left lobe of liver measuring 5 mm. Gallbladder appears normal. There is no biliary dilatation.  Pancreas: Appears normal.  Spleen: Is normal.  Adrenals/Urinary Tract: Normal adrenal glands. Normal appearance of the kidneys. The urinary bladder is normal.  Stomach/Bowel: The stomach and the small bowel loops have a normal course and caliber. No bowel obstruction. The proximal colon appears within normal limits. Stable appearance of the distal colon and rectum. Rectal suture line is again identified, image 106/series 2. Similar appearance of  presacral soft tissue thickening.  Vascular/Lymphatic: Calcified atherosclerotic disease involves the abdominal aorta. No aneurysm. No enlarged retroperitoneal or mesenteric adenopathy. No enlarged pelvic or inguinal lymph nodes. Retro aortic lymph node measures 0.9cm, image 72/series 2. This is compared with 0.9 cm previously. No mesenteric adenopathy. No pelvic or inguinal adenopathy.  Reproductive: Previous hysterectomy.  No adnexal mass noted.  Other: No ascites or focal fluid collections within the abdomen or pelvis. No peritoneal nodule or mass identified.  Musculoskeletal: Mild degenerative disc disease within the lumbar spine.  IMPRESSION: 1. No specific features identified to suggest residual or recurrence of tumor within the chest, abdomen or pelvis. 2. Stable enhancing liver lesions, previously described as representing benign lesions. 3. Small nonspecific pulmonary nodules are unchanged from previous exam 4. Gradual increase in size of right upper thoracic paraspinal lesion when compared with remote study from 09/04/2010. This is favored to represent a benign indolent process such as a nerve sheath tumor.   Electronically Signed   By: Kerby Moors M.D.   On: 12/21/2014 11:17   I have independently reviewed the above radiology studies  and reviewed the findings with the patient.     I have independently reviewed the above radiologic studies.  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 It's unclear if the patient's current symptoms of bright point tenderness  or related to this, her symptoms are not dermatomal in nature. The mass has enlarged slightly over the past 5 years. From 2.7 cm maximum diameter and 2011  To 3.5 cm now. History of rectal cancer completing radiation chemotherapy and surgical resection without evidence of recurrence History of hypertension History of diabetes  I discussed the patient's symptoms with her and reviewed the old and current CT scans of the chest. I've discussed with her the surgical option of thoracoscopic removal of the mass versus continued surveillance. She has not anxious to proceed with surgical resection at this point. I recommended that we continue surveillance and in the future obtain a MRI including the mass to evaluate for any neural involvement. She is agreeable with this we tentatively plan in December 2016 a MRI of the chest to evaluate the  mass.  I  spent 45 minutes counseling the patient face to face and 50% or more the  time was spent in counseling and coordination of care. The total time spent in the appointment was 60 minutes.  Grace Isaac MD      [address] ,[place] [address] Office [phone]   Beeper [phone]  12/30/2014 9:24 AM

## 2014-12-31 ENCOUNTER — Ambulatory Visit (INDEPENDENT_AMBULATORY_CARE_PROVIDER_SITE_OTHER): Payer: 59 | Admitting: Gastroenterology

## 2014-12-31 ENCOUNTER — Encounter: Payer: Self-pay | Admitting: Gastroenterology

## 2014-12-31 VITALS — BP 140/84 | HR 68 | Ht 63.0 in | Wt 204.8 lb

## 2014-12-31 DIAGNOSIS — R195 Other fecal abnormalities: Secondary | ICD-10-CM | POA: Insufficient documentation

## 2014-12-31 DIAGNOSIS — R194 Change in bowel habit: Secondary | ICD-10-CM

## 2014-12-31 NOTE — Progress Notes (Signed)
     12/31/2014 PATTON SWISHER [id number] 05-Nov-1951   History of Present Illness:  This is a pleasant 64 year old female who his known to Dr. Henrene Pastor for colonoscopy.  She was diagnosed with stage 1 rectal cancer in 11/2009.  She underwent radiation and chemo along with resection.  She had a colonoscopy by Dr. Henrene Pastor in 01/2014 that was a normal post-operative study except for a small polyp in the descending colon that was a tubular adenoma; repeat colonoscopy recommended in 3 years.  She presents to our office today with complaints of change in the caliber of her stool over the past 3-6 months.  Says that her stools are very thin.  Sometimes they are hard, but other times if she eats certain things such as dairy or salads then she has diarrhea.  Sometimes has some slight fecal leakage as well.  Denies any rectal bleeding.  Had a CT scan of the abdomen and pelvis with contrast on 12/21/2014 that showed no sign of tumor recurrence or any new issues.     Current Medications, Allergies, Past Medical History, Past Surgical History, Family History and Social History were reviewed in [employer] record.   Physical Exam: BP 140/84 mmHg  Pulse 68  Ht 5\' 3"  (1.6 m)  Wt 204 lb 12.8 oz (92.897 kg)  BMI 36.29 kg/m2 General: Well developed female in no acute distress Head: Normocephalic and atraumatic Eyes:  Sclerae anicteric, conjunctiva pink  Ears: Normal auditory acuity Abdomen: Soft, non-distended.  Normal bowel sounds.  Previous laparotomy scar noted.  Mild lower abdominal TTP, but abdominal exam benign.   Rectal:  No external abnormalities noted.  DRE revealed some hard mobile stool, but no masses.  Light brown stool noted on exam glove; heme negative. Musculoskeletal: Symmetrical with no gross deformities  Extremities: No edema  Neurological: Alert oriented x 4, grossly non-focal Psychological:  Alert and cooperative. Normal mood and affect  Assessment and  Recommendations: -Change in stool caliber in a patient with history of rectal cancer:  Last colonoscopy 01/2014 unremarkable as well as CT scan earlier this month.  This may be due to radiation changes or scarring in that area.  Will start fiber supplement such as Benefiber or Metamucil daily.  May need to add Miralax daily to the regimen as well.  Follow-up in 6 weeks with Dr. Henrene Pastor for update on her progress.

## 2014-12-31 NOTE — Patient Instructions (Addendum)
Please purchase over the counter fiber supplement to take daily such as Metamucil or Benefiber  Please follow up with Dr. Henrene Pastor in 6 weeks.  VH:OYWVX Tonya Vincent

## 2015-01-03 NOTE — Progress Notes (Signed)
Agree with initial assessment and plans as well as follow-up.

## 2015-11-01 DIAGNOSIS — F331 Major depressive disorder, recurrent, moderate: Secondary | ICD-10-CM | POA: Diagnosis not present

## 2015-11-01 DIAGNOSIS — I10 Essential (primary) hypertension: Secondary | ICD-10-CM | POA: Diagnosis not present

## 2015-11-01 DIAGNOSIS — M130 Polyarthritis, unspecified: Secondary | ICD-10-CM | POA: Diagnosis not present

## 2015-11-01 DIAGNOSIS — E089 Diabetes mellitus due to underlying condition without complications: Secondary | ICD-10-CM | POA: Diagnosis not present

## 2015-11-09 ENCOUNTER — Ambulatory Visit: Payer: 59 | Admitting: Cardiothoracic Surgery

## 2015-12-20 DIAGNOSIS — E119 Type 2 diabetes mellitus without complications: Secondary | ICD-10-CM | POA: Diagnosis not present

## 2015-12-20 DIAGNOSIS — H40003 Preglaucoma, unspecified, bilateral: Secondary | ICD-10-CM | POA: Diagnosis not present

## 2015-12-20 DIAGNOSIS — H2513 Age-related nuclear cataract, bilateral: Secondary | ICD-10-CM | POA: Diagnosis not present

## 2016-01-02 ENCOUNTER — Other Ambulatory Visit (HOSPITAL_BASED_OUTPATIENT_CLINIC_OR_DEPARTMENT_OTHER): Payer: PPO

## 2016-01-02 ENCOUNTER — Ambulatory Visit (HOSPITAL_BASED_OUTPATIENT_CLINIC_OR_DEPARTMENT_OTHER): Payer: PPO | Admitting: Hematology and Oncology

## 2016-01-02 ENCOUNTER — Encounter: Payer: Self-pay | Admitting: Hematology and Oncology

## 2016-01-02 ENCOUNTER — Telehealth: Payer: Self-pay | Admitting: Hematology and Oncology

## 2016-01-02 VITALS — BP 142/78 | HR 81 | Temp 97.6°F | Resp 17 | Ht 63.0 in | Wt 208.9 lb

## 2016-01-02 DIAGNOSIS — I10 Essential (primary) hypertension: Secondary | ICD-10-CM | POA: Diagnosis not present

## 2016-01-02 DIAGNOSIS — Z85048 Personal history of other malignant neoplasm of rectum, rectosigmoid junction, and anus: Secondary | ICD-10-CM

## 2016-01-02 DIAGNOSIS — E089 Diabetes mellitus due to underlying condition without complications: Secondary | ICD-10-CM | POA: Diagnosis not present

## 2016-01-02 DIAGNOSIS — R0602 Shortness of breath: Secondary | ICD-10-CM | POA: Diagnosis not present

## 2016-01-02 DIAGNOSIS — M130 Polyarthritis, unspecified: Secondary | ICD-10-CM | POA: Diagnosis not present

## 2016-01-02 DIAGNOSIS — R29818 Other symptoms and signs involving the nervous system: Secondary | ICD-10-CM | POA: Diagnosis not present

## 2016-01-02 DIAGNOSIS — C20 Malignant neoplasm of rectum: Secondary | ICD-10-CM

## 2016-01-02 DIAGNOSIS — R222 Localized swelling, mass and lump, trunk: Secondary | ICD-10-CM

## 2016-01-02 DIAGNOSIS — F331 Major depressive disorder, recurrent, moderate: Secondary | ICD-10-CM | POA: Diagnosis not present

## 2016-01-02 LAB — CBC WITH DIFFERENTIAL/PLATELET
BASO%: 0.4 % (ref 0.0–2.0)
Basophils Absolute: 0 10*3/uL (ref 0.0–0.1)
EOS ABS: 0.1 10*3/uL (ref 0.0–0.5)
EOS%: 2.8 % (ref 0.0–7.0)
HCT: 38.6 % (ref 34.8–46.6)
HGB: 12.2 g/dL (ref 11.6–15.9)
LYMPH%: 34.9 % (ref 14.0–49.7)
MCH: 24.8 pg — ABNORMAL LOW (ref 25.1–34.0)
MCHC: 31.6 g/dL (ref 31.5–36.0)
MCV: 78.6 fL — ABNORMAL LOW (ref 79.5–101.0)
MONO#: 0.3 10*3/uL (ref 0.1–0.9)
MONO%: 7.4 % (ref 0.0–14.0)
NEUT#: 2.5 10*3/uL (ref 1.5–6.5)
NEUT%: 54.5 % (ref 38.4–76.8)
PLATELETS: 273 10*3/uL (ref 145–400)
RBC: 4.91 10*6/uL (ref 3.70–5.45)
RDW: 16.6 % — ABNORMAL HIGH (ref 11.2–14.5)
WBC: 4.6 10*3/uL (ref 3.9–10.3)
lymph#: 1.6 10*3/uL (ref 0.9–3.3)

## 2016-01-02 LAB — COMPREHENSIVE METABOLIC PANEL
ALT: 17 U/L (ref 0–55)
ANION GAP: 11 meq/L (ref 3–11)
AST: 13 U/L (ref 5–34)
Albumin: 3.9 g/dL (ref 3.5–5.0)
Alkaline Phosphatase: 84 U/L (ref 40–150)
BUN: 17.4 mg/dL (ref 7.0–26.0)
CHLORIDE: 106 meq/L (ref 98–109)
CO2: 26 mEq/L (ref 22–29)
CREATININE: 1.3 mg/dL — AB (ref 0.6–1.1)
Calcium: 10 mg/dL (ref 8.4–10.4)
EGFR: 50 mL/min/{1.73_m2} — AB (ref >90)
Glucose: 154 mg/dl — ABNORMAL HIGH (ref 70–140)
Potassium: 3.6 mEq/L (ref 3.5–5.1)
Sodium: 144 mEq/L (ref 136–145)
TOTAL PROTEIN: 8 g/dL (ref 6.4–8.3)

## 2016-01-02 NOTE — Progress Notes (Signed)
[facility] OFFICE PROGRESS NOTE  Patient Care Team: [name], MD as PCP - General (Family Medicine) [name], MD as Consulting Physician (Hematology and Oncology)  SUMMARY OF ONCOLOGIC HISTORY: Oncology History   Malignant neoplasm of rectum, cT3N0M0 down staged to ypT2N0M0 after neoadjuvant chemotherapy and radiation therapy   Primary site: Colon and Rectum   Staging method: AJCC 7th Edition   Clinical: Stage I (T2, N0, M0) signed by [name], MD on 12/28/2013  1:49 PM   Pathologic: Stage I (T2, N0, cM0) signed by [name], MD on 12/28/2013  1:49 PM   Summary: Stage I (T2, N0, cM0)       History of rectal cancer   11/17/2009 Procedure Colonoscopy and rectal biopsy confirmed moderately differentiated adenocarcinoma   11/18/2009 Imaging Transrectal ultrasound place staging T3 lesion   11/18/2009 Imaging Staging CT scan of the chest, abdomen and pelvis showed multiple lesions in the liver as well as paraspinal mass of unknown etiology   12/05/2009 - 04/03/2010 Chemotherapy The patient completed new adjuvant chemotherapy with 5-FU and radiation therapy   12/14/2009 Imaging PET CT scan show faint metabolic activity in the paraspinal mass with no activity in the liver   04/24/2010 Surgery She had surgical resection with negative margins. Final pathology was T2, N0, M0 (down staged by chemoradiation therapy from T3, N0, M0)   11/19/2012 Imaging MRI of the liver confirmed benign hemangioma   01/08/2014 Procedure Colonoscopy and biopsy was negative   12/21/2014 Imaging CT scan showed no recurrence of colon cancer. Incidentally, paraspinal mass is slightly larger.    INTERVAL HISTORY: Please see below for problem oriented charting. She returns for further follow-up. She complained of intermittent chest wall discomfort and tingling sensation over her right side. Denies changes in bowel habits. Appetite is stable, denies recent weight loss. She is up-to-date with all other  screening programs  REVIEW OF SYSTEMS:   Constitutional: Denies fevers, chills or abnormal weight loss Eyes: Denies blurriness of vision Ears, nose, mouth, throat, and face: Denies mucositis or sore throat Respiratory: Denies cough, dyspnea or wheezes Cardiovascular: Denies palpitation, chest discomfort or lower extremity swelling Gastrointestinal:  Denies nausea, heartburn or change in bowel habits Skin: Denies abnormal skin rashes Lymphatics: Denies new lymphadenopathy or easy bruising Behavioral/Psych: Mood is stable, no new changes  All other systems were reviewed with the patient and are negative.  I have reviewed the past medical history, past surgical history, social history and family history with the patient and they are unchanged from previous note.  ALLERGIES:  is allergic to antibacterial hand soap and norgesic.  MEDICATIONS:  Current Outpatient Prescriptions  Medication Sig Dispense Refill  . ibuprofen (ADVIL,MOTRIN) 800 MG tablet Take 800 mg by mouth every 8 (eight) hours as needed.    . Linagliptin-Metformin HCl 2.5-850 MG TABS Take 1 tablet by mouth.    . traZODone (DESYREL) 50 MG tablet Take 50 mg by mouth at bedtime.    . Olmesartan-Amlodipine-HCTZ 40-10-25 MG TABS Take 1 tablet by mouth daily.     No current facility-administered medications for this visit.    PHYSICAL EXAMINATION: ECOG PERFORMANCE STATUS: 1 - Symptomatic but completely ambulatory  Filed Vitals:   01/02/16 0850 01/02/16 0853  BP: 151/75 142/78  Pulse: 81   Temp: 97.6 F (36.4 C)   Resp: 17    Filed Weights   01/02/16 0850  Weight: 208 lb 14.4 oz (94.756 kg)    GENERAL:alert, no distress and comfortable SKIN: skin color,  texture, turgor are normal, no rashes or significant lesions EYES: normal, Conjunctiva are pink and non-injected, sclera clear OROPHARYNX:no exudate, no erythema and lips, buccal mucosa, and tongue normal  NECK: supple, thyroid normal size, non-tender, without  nodularity LYMPH:  no palpable lymphadenopathy in the cervical, axillary or inguinal LUNGS: clear to auscultation and percussion with normal breathing effort HEART: regular rate & rhythm and no murmurs and no lower extremity edema ABDOMEN:abdomen soft, non-tender and normal bowel sounds Musculoskeletal:no cyanosis of digits and no clubbing  NEURO: alert & oriented x 3 with fluent speech, no focal motor/sensory deficits  LABORATORY DATA:  I have reviewed the data as listed    Component Value Date/Time   NA 144 01/02/2016 0832   NA 139 02/12/2012 1150   NA 148* 04/24/2011 0837   K 3.6 01/02/2016 0832   K 4.2 02/12/2012 1150   K 4.1 04/24/2011 0837   CL 99 11/05/2012 0842   CL 103 02/12/2012 1150   CL 98 04/24/2011 0837   CO2 26 01/02/2016 0832   CO2 22 02/12/2012 1150   CO2 27 04/24/2011 0837   GLUCOSE 154* 01/02/2016 0832   GLUCOSE 167* 11/05/2012 0842   GLUCOSE 143* 02/12/2012 1150   GLUCOSE 143* 04/24/2011 0837   BUN 17.4 01/02/2016 0832   BUN 13 02/12/2012 1150   BUN 13 04/24/2011 0837   CREATININE 1.3* 01/02/2016 0832   CREATININE 0.97 02/12/2012 1150   CREATININE 0.9 04/24/2011 0837   CALCIUM 10.0 01/02/2016 0832   CALCIUM 10.3 02/12/2012 1150   CALCIUM 9.4 04/24/2011 0837   PROT 8.0 01/02/2016 0832   PROT 7.1 02/12/2012 1150   PROT 7.6 04/24/2011 0837   ALBUMIN 3.9 01/02/2016 0832   ALBUMIN 4.3 02/12/2012 1150   ALBUMIN 4.0 04/24/2011 0837   AST 13 01/02/2016 0832   AST 13 02/12/2012 1150   AST 21 04/24/2011 0837   ALT 17 01/02/2016 0832   ALT 12 02/12/2012 1150   ALT 27 04/24/2011 0837   ALKPHOS 84 01/02/2016 0832   ALKPHOS 85 02/12/2012 1150   ALKPHOS 83 04/24/2011 0837   BILITOT <0.30 01/02/2016 0832   BILITOT 0.3 02/12/2012 1150   BILITOT 0.50 04/24/2011 0837   GFRNONAA 50* 04/29/2010 0915   GFRAA  04/29/2010 0915    >60        The eGFR has been calculated using the MDRD equation. This calculation has not been validated in all  clinical situations. eGFR's persistently <60 mL/min signify possible Chronic Kidney Disease.    No results found for: SPEP, UPEP  Lab Results  Component Value Date   WBC 4.6 01/02/2016   NEUTROABS 2.5 01/02/2016   HGB 12.2 01/02/2016   HCT 38.6 01/02/2016   MCV 78.6* 01/02/2016   PLT 273 01/02/2016      Chemistry      Component Value Date/Time   NA 144 01/02/2016 0832   NA 139 02/12/2012 1150   NA 148* 04/24/2011 0837   K 3.6 01/02/2016 0832   K 4.2 02/12/2012 1150   K 4.1 04/24/2011 0837   CL 99 11/05/2012 0842   CL 103 02/12/2012 1150   CL 98 04/24/2011 0837   CO2 26 01/02/2016 0832   CO2 22 02/12/2012 1150   CO2 27 04/24/2011 0837   BUN 17.4 01/02/2016 0832   BUN 13 02/12/2012 1150   BUN 13 04/24/2011 0837   CREATININE 1.3* 01/02/2016 0832   CREATININE 0.97 02/12/2012 1150   CREATININE 0.9 04/24/2011 6073  Component Value Date/Time   CALCIUM 10.0 01/02/2016 0832   CALCIUM 10.3 02/12/2012 1150   CALCIUM 9.4 04/24/2011 0837   ALKPHOS 84 01/02/2016 0832   ALKPHOS 85 02/12/2012 1150   ALKPHOS 83 04/24/2011 0837   AST 13 01/02/2016 0832   AST 13 02/12/2012 1150   AST 21 04/24/2011 0837   ALT 17 01/02/2016 0832   ALT 12 02/12/2012 1150   ALT 27 04/24/2011 0837   BILITOT <0.30 01/02/2016 0832   BILITOT 0.3 02/12/2012 1150   BILITOT 0.50 04/24/2011 0837      ASSESSMENT & PLAN:  History of rectal cancer Clinically, she has no signs of cancer recurrence.  I reviewed the current NCCN guidelines with the patient. Her last colonoscopy was in 2015, due in 5 years. Per guidelines, there is no benefit for further surveillance imaging in the future.   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 We discussed possible surgical resection but the patient declined because of insurance issue for now. I told her there is no benefit for routine imaging unless she is interested for surgery  Essential hypertension she  will continue current medical management. I recommend close follow-up with primary care doctor for medication adjustment. I recommend she start 81 mg aspirin for cardiac prevention   Orders Placed This Encounter  Procedures  . CBC with Differential/Platelet    Standing Status: Future     Number of Occurrences:      Standing Expiration Date: 02/05/2017  . Comprehensive metabolic panel    Standing Status: Future     Number of Occurrences:      Standing Expiration Date: 02/05/2017  . CEA    Standing Status: Future     Number of Occurrences:      Standing Expiration Date: 02/05/2017   All questions were answered. The patient knows to call the clinic with any problems, questions or concerns. No barriers to learning was detected. I spent 15 minutes counseling the patient face to face. The total time spent in the appointment was 20 minutes and more than 50% was on counseling and review of test results     [facility], [place], MD 01/02/2016 12:19 PM

## 2016-01-02 NOTE — Telephone Encounter (Signed)
Gave patient avs report and appointments for January 2018.  °

## 2016-01-02 NOTE — Assessment & Plan Note (Signed)
Clinically, she has no signs of cancer recurrence.  I reviewed the current NCCN guidelines with the patient. Her last colonoscopy was in 2015, due in 5 years. Per guidelines, there is no benefit for further surveillance imaging in the future.

## 2016-01-02 NOTE — Assessment & Plan Note (Signed)
She is symptomatic with neuropathic discomfort over the right chest wall. We discussed possible surgical resection but the patient declined because of insurance issue for now. I told her there is no benefit for routine imaging unless she is interested for surgery

## 2016-01-02 NOTE — Assessment & Plan Note (Signed)
she will continue current medical management. I recommend close follow-up with primary care doctor for medication adjustment. I recommend she start 81 mg aspirin for cardiac prevention 

## 2016-01-03 LAB — CEA: CEA: 1.2 ng/mL (ref 0.0–4.7)

## 2016-01-03 LAB — CEA (PARALLEL TESTING): CEA: 0.5 ng/mL (ref 0.0–5.0)

## 2016-01-18 DIAGNOSIS — I10 Essential (primary) hypertension: Secondary | ICD-10-CM | POA: Diagnosis not present

## 2016-01-18 DIAGNOSIS — R0602 Shortness of breath: Secondary | ICD-10-CM | POA: Diagnosis not present

## 2016-01-18 DIAGNOSIS — R0789 Other chest pain: Secondary | ICD-10-CM | POA: Diagnosis not present

## 2016-01-18 DIAGNOSIS — Z9221 Personal history of antineoplastic chemotherapy: Secondary | ICD-10-CM | POA: Diagnosis not present

## 2016-01-23 DIAGNOSIS — R0602 Shortness of breath: Secondary | ICD-10-CM | POA: Diagnosis not present

## 2016-01-23 DIAGNOSIS — E119 Type 2 diabetes mellitus without complications: Secondary | ICD-10-CM | POA: Diagnosis not present

## 2016-01-23 DIAGNOSIS — R0789 Other chest pain: Secondary | ICD-10-CM | POA: Diagnosis not present

## 2016-01-23 DIAGNOSIS — I10 Essential (primary) hypertension: Secondary | ICD-10-CM | POA: Diagnosis not present

## 2016-02-01 DIAGNOSIS — R0602 Shortness of breath: Secondary | ICD-10-CM | POA: Diagnosis not present

## 2016-02-01 DIAGNOSIS — R079 Chest pain, unspecified: Secondary | ICD-10-CM | POA: Diagnosis not present

## 2016-02-01 DIAGNOSIS — I10 Essential (primary) hypertension: Secondary | ICD-10-CM | POA: Diagnosis not present

## 2016-02-15 DIAGNOSIS — R0789 Other chest pain: Secondary | ICD-10-CM | POA: Diagnosis not present

## 2016-02-15 DIAGNOSIS — E119 Type 2 diabetes mellitus without complications: Secondary | ICD-10-CM | POA: Diagnosis not present

## 2016-02-15 DIAGNOSIS — R0602 Shortness of breath: Secondary | ICD-10-CM | POA: Diagnosis not present

## 2016-02-15 DIAGNOSIS — I10 Essential (primary) hypertension: Secondary | ICD-10-CM | POA: Diagnosis not present

## 2016-03-02 DIAGNOSIS — I10 Essential (primary) hypertension: Secondary | ICD-10-CM | POA: Diagnosis not present

## 2016-03-02 DIAGNOSIS — M130 Polyarthritis, unspecified: Secondary | ICD-10-CM | POA: Diagnosis not present

## 2016-03-02 DIAGNOSIS — E089 Diabetes mellitus due to underlying condition without complications: Secondary | ICD-10-CM | POA: Diagnosis not present

## 2016-03-02 DIAGNOSIS — F900 Attention-deficit hyperactivity disorder, predominantly inattentive type: Secondary | ICD-10-CM | POA: Diagnosis not present

## 2016-03-28 DIAGNOSIS — H40003 Preglaucoma, unspecified, bilateral: Secondary | ICD-10-CM | POA: Diagnosis not present

## 2016-03-28 DIAGNOSIS — H2513 Age-related nuclear cataract, bilateral: Secondary | ICD-10-CM | POA: Diagnosis not present

## 2016-03-28 DIAGNOSIS — E119 Type 2 diabetes mellitus without complications: Secondary | ICD-10-CM | POA: Diagnosis not present

## 2016-04-07 LAB — BASIC METABOLIC PANEL
GLUCOSE: 134 mg/dL
Glucose: 134 mg/dL

## 2016-04-13 DIAGNOSIS — I10 Essential (primary) hypertension: Secondary | ICD-10-CM | POA: Diagnosis not present

## 2016-04-13 DIAGNOSIS — E089 Diabetes mellitus due to underlying condition without complications: Secondary | ICD-10-CM | POA: Diagnosis not present

## 2016-04-13 LAB — BASIC METABOLIC PANEL: Glucose: 167 mg/dL

## 2016-06-15 DIAGNOSIS — F900 Attention-deficit hyperactivity disorder, predominantly inattentive type: Secondary | ICD-10-CM | POA: Diagnosis not present

## 2016-06-15 DIAGNOSIS — M130 Polyarthritis, unspecified: Secondary | ICD-10-CM | POA: Diagnosis not present

## 2016-06-15 DIAGNOSIS — I10 Essential (primary) hypertension: Secondary | ICD-10-CM | POA: Diagnosis not present

## 2016-06-15 DIAGNOSIS — E089 Diabetes mellitus due to underlying condition without complications: Secondary | ICD-10-CM | POA: Diagnosis not present

## 2016-06-29 DIAGNOSIS — M130 Polyarthritis, unspecified: Secondary | ICD-10-CM | POA: Diagnosis not present

## 2016-06-29 DIAGNOSIS — I10 Essential (primary) hypertension: Secondary | ICD-10-CM | POA: Diagnosis not present

## 2016-06-29 DIAGNOSIS — E089 Diabetes mellitus due to underlying condition without complications: Secondary | ICD-10-CM | POA: Diagnosis not present

## 2016-06-29 DIAGNOSIS — F331 Major depressive disorder, recurrent, moderate: Secondary | ICD-10-CM | POA: Diagnosis not present

## 2016-09-13 ENCOUNTER — Telehealth: Payer: Self-pay | Admitting: Internal Medicine

## 2016-09-13 DIAGNOSIS — E0800 Diabetes mellitus due to underlying condition with hyperosmolarity without nonketotic hyperglycemic-hyperosmolar coma (NKHHC): Secondary | ICD-10-CM | POA: Diagnosis not present

## 2016-09-13 DIAGNOSIS — C20 Malignant neoplasm of rectum: Secondary | ICD-10-CM | POA: Diagnosis not present

## 2016-09-13 DIAGNOSIS — I10 Essential (primary) hypertension: Secondary | ICD-10-CM | POA: Diagnosis not present

## 2016-09-13 NOTE — Telephone Encounter (Signed)
Discussed with Dr. Fransico Setters office that the first available appt we have even with an app is Wednesday. Dr. Criss Rosales requested to speak with Dr. Henrene Pastor regarding the pt. Dr. Criss Rosales given the pager number for Dr. Henrene Pastor.

## 2016-09-14 ENCOUNTER — Telehealth: Payer: Self-pay

## 2016-09-14 ENCOUNTER — Telehealth: Payer: Self-pay | Admitting: Internal Medicine

## 2016-09-14 NOTE — Telephone Encounter (Signed)
Dr Fransico Setters office called back to get patient seen today 09-14-16. Says Dr Henrene Pastor told Dr Criss Rosales we would work them in today. I apologized and told he we did not have an opening for today. Wanted to speak to the nurse.

## 2016-09-14 NOTE — Telephone Encounter (Signed)
No note received from Dr. Henrene Pastor, he is off today. Let office know there are no appts available. Pt scheduled to see Ellouise Newer PA 09/18/16@2 :45pm. Spoke with Kathaleen Maser at the [facility] clinic and she will notify pt of appt.

## 2016-09-14 NOTE — Telephone Encounter (Signed)
Tameka with Dr. Fransico Setters office called back and Dr. Criss Rosales wanted the pt seen today. Spoke with Tameka and let her know we did not have appts for today or Monday and that the pt has been scheduled for Tuesday. Also let her know the Pt called here and wanted to cancel the appt for Tuesday stating it was not a good day for her and that her copay was 40 dollars, would like to be told what she needs to have done over the phone. Pt Stated she was going to call Dr. Criss Rosales back and get them to just schedule colon. Tameka spoke with Dr. Criss Rosales and they are going to call pt back and let her know she needs to be seen as soon as we can get her scheduled. Assured Tameka that if we have a sooner cancellation we will get pt in sooner.

## 2016-09-14 NOTE — Telephone Encounter (Signed)
See additional phone note. 

## 2016-09-18 ENCOUNTER — Ambulatory Visit: Payer: PPO | Admitting: Physician Assistant

## 2016-10-01 ENCOUNTER — Ambulatory Visit (INDEPENDENT_AMBULATORY_CARE_PROVIDER_SITE_OTHER): Payer: PPO | Admitting: Physician Assistant

## 2016-10-01 ENCOUNTER — Encounter: Payer: Self-pay | Admitting: Physician Assistant

## 2016-10-01 VITALS — BP 126/80 | HR 88 | Ht 63.0 in | Wt 195.6 lb

## 2016-10-01 DIAGNOSIS — K625 Hemorrhage of anus and rectum: Secondary | ICD-10-CM

## 2016-10-01 DIAGNOSIS — R194 Change in bowel habit: Secondary | ICD-10-CM

## 2016-10-01 DIAGNOSIS — Z85048 Personal history of other malignant neoplasm of rectum, rectosigmoid junction, and anus: Secondary | ICD-10-CM

## 2016-10-01 NOTE — Patient Instructions (Addendum)
You have been scheduled for a colonoscopy. Please follow written instructions given to you at your visit today.  Please pick up your prep supplies at the pharmacy within the next 1-3 days. If you use inhalers (even only as needed), please bring them with you on the day of your procedure. Your physician has requested that you go to www.startemmi.com and enter the access code given to you at your visit today. This web site gives a general overview about your procedure. However, you should still follow specific instructions given to you by our office regarding your preparation for the procedure.   We have given you a coupon for Align probiotic.

## 2016-10-01 NOTE — Progress Notes (Signed)
Agree with initial assessment and plans 

## 2016-10-01 NOTE — Progress Notes (Signed)
Chief Complaint: Rectal Bleeding  HPI:  Tonya Vincent is a 65 year old female with a personal history of colon cancer diagnosed in December 2010 with subsequent neoadjuvant therapy and surgery in 2011, anemia, anxiety, depression, diabetes and hypertension who was referred to me by [name], MD for a complaint of recent rectal bleeding and change in bowel habits. Patient was last seen in clinic on 12/31/2014 by Alonza Bogus, she is a Dr. Henrene Pastor patient. At that visit, the patient also describes a change in stool caliber. At that time because she had had a normal colonoscopy 01/2014 as well as a CT scan earlier that month which was normal, it was thought that this was likely due to radiation changes or scarring in her rectum. A fiber supplement was started and she was told to add MiraLAX and follow up with Dr. Henrene Pastor.  Independent review of report and images from patient's last colonoscopy on 01/08/14 showed a diminutive polyp in the descending colon and evidence of prior low anterior surgical anastomosis. Pathology revealed a tubular adenoma with no dysplasia. Repeat was recommended in 3 years.  Today, the patient describes that on 09/11/2016 she became constipated due to "overheating", when this finely "relieved itself", the patient started with diarrhea with some mucus mixed in. This continued into Wednesday and she started with some bright red blood per rectum, at times she was incontinent of mucus and blood throughout that day. The next day she went to see her primary care provider who referred her back to our office. She did "stop eating most things" around that time and had a subsequent decrease in the number of bowel movements she had, but describes that the last time she saw bright red blood was on Thursday, 09/13/16, since then she has continued with some looser stool mixed in with mucus. Sometimes she is incontinent of this. Patient tells me that her typical bowel movements are softer than normal  and occasionally she does have mucus, but she has not seen blood until this time. Patient tells me that she occasionally uses a fiber supplement.  Patient's medical history is positive for diagnosis of rectal cancer for which she had surgery and radiation in 2011. Patient is concerned at this time.  Patient denies fever, chills, nausea, vomiting, abdominal or rectal pain, symptoms that awaken her at night, unintentional weight loss, fatigue, anorexia, changes in diet or medication.   Past Medical History:  Diagnosis Date  . Anemia   . Anxiety   . Depression   . Diabetes mellitus   . Heart murmur   . History of rectal cancer 10/04/2011  . Hypertension   . Malignant neoplasm of rectum ([place]) 10/04/2011  . 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 rectal ca` dx'd 11/16/09   xrt comp 01/2010; chemo comp 01/2011  . Tubular adenoma of colon [id number]    Past Surgical History:  Procedure Laterality Date  . ABDOMINAL HYSTERECTOMY     for uterine fibroids  . COLON SURGERY N/A 04/24/2010   "took out half rectum"; rectal cancer  . HAND SURGERY     carpal tunnel- left  . [place]  . PORTACATH PLACEMENT     2011    Current Outpatient Prescriptions  Medication Sig Dispense Refill  . aspirin EC 81 MG tablet Take 81 mg by mouth daily.    . empagliflozin (JARDIANCE) 25 MG TABS tablet Take 25 mg by mouth daily.    . methylphenidate 54 MG PO CR tablet Take 54 mg by mouth  every morning.    . Olmesartan-Amlodipine-HCTZ 40-10-25 MG TABS Take 0.5 tablets by mouth daily.     Marland Kitchen ibuprofen (ADVIL,MOTRIN) 800 MG tablet Take 800 mg by mouth every 8 (eight) hours as needed.     No current facility-administered medications for this visit.     Allergies as of 10/01/2016 - Review Complete 10/01/2016  Allergen Reaction Noted  . Antibacterial hand soap [triclosan] Dermatitis 08/24/2013  . Norgesic [orphenadrine-aspirin-caffeine]  04/24/2011    Family History  Problem Relation Age of Onset  .  Cancer Father 80    leukemia  . Colon cancer Neg Hx   . Esophageal cancer Neg Hx   . Stomach cancer Neg Hx   . Rectal cancer Neg Hx     Social History   Social History  . Marital status: Single    Spouse name: N/A  . Number of children: N/A  . Years of education: N/A   Occupational History  . Not on file.   Social History Main Topics  . Smoking status: Former Smoker    Packs/day: 0.50    Years: 35.00    Types: Cigarettes, Cigars    Quit date: 12/03/2009  . Smokeless tobacco: Never Used  . Alcohol use Yes     Comment: socially  . Drug use: No  . Sexual activity: Not on file   Other Topics Concern  . Not on file   Social History Narrative  . No narrative on file    Review of Systems:     Constitutional: No weight loss, fever, chills, weakness or fatigue HEENT: Eyes: No change in vision               Ears, Nose, Throat:  No change in hearing or congestion Skin: No rash or itching Cardiovascular: No chest pain, chest pressure or palpitations   Respiratory: No SOB or cough Gastrointestinal: See HPI and otherwise negative Genitourinary: No dysuria or change in urinary frequency Neurological: No headache, dizziness or syncope Musculoskeletal: No new muscle or joint pain Hematologic: No bruising Psychiatric: Positive history of anxiety and depression    Physical Exam:  Vital signs: Ht 5\' 3"  (1.6 m)   Wt 195 lb 9.6 oz (88.7 kg)   BMI 34.65 kg/m   General: African-American female appears to be in NAD, Well developed, Well nourished, alert and cooperative Head:  Normocephalic and atraumatic. Eyes:   PEERL, EOMI. No icterus. Conjunctiva pink. Ears:  Normal auditory acuity. Neck:  Supple Throat: Oral cavity and pharynx without inflammation, swelling or lesion.  Lungs: Respirations even and unlabored. Lungs clear to auscultation bilaterally.   No wheezes, crackles, or rhonchi.  Heart: Normal S1, S2. No MRG. Regular rate and rhythm. No peripheral edema, cyanosis or  pallor.  Abdomen:  Soft, nondistended, nontender. No rebound or guarding. Normal bowel sounds. No appreciable masses or hepatomegaly. Rectal: External: No lesions, hemorrhoids or irritation; internal: No mass, hemorrhoids or fissure, no pain with palpation, no residue Msk:  Symmetrical without gross deformities. Peripheral pulses intact.  Extremities:  Without edema, no deformity or joint abnormality. Normal ROM. Neurologic:  Alert and  oriented x4;  grossly normal neurologically. CN II-XII intact.  Skin:   Dry and intact without significant lesions or rashes. Psychiatric: Oriented to person, place and time. Demonstrates good judgement and reason without abnormal affect or behaviors.  No recent labs.  Assessment: 1. Rectal bleeding: For a period of 3 days, 2 weeks ago, none since, related to a change in bowel habits at that  time; consider radiation proctitis versus hemorrhoids versus cancer 2. Change in bowel habits: Towards looser stools with mucus and some incontinence over the past 2 weeks; this sounds somewhat normal for the patient? She is a poor historian when asked about her "normal" 3. History of rectal cancer in 2010: Patient underwent radiation and surgery for this in 2011; last colonoscopy February 2015 recommendations for repeat in 3 years due to finding of tubular adenoma  Plan: 1. At this time as patient is nearly due for a screening colonoscopy, we will schedule at this time for further evaluation of recent rectal bleeding and change in bowel habits. Discussed risks, benefits, limitations and alternatives of this procedure and the patient agrees to proceed. This is scheduled with Dr. Henrene Pastor. 2. Recommend the patient increase fiber intake as she does tell me that she "is bad at remembering this on a daily basis". Discussed that should she should try to achieve 25-35 g per day with use of fiber supplement and/or through her diet. 3. Recommend the patient start a daily probiotic after  prep for her colonoscopy. Gave her an align coupon. She told me that "most likely at I will not do this". 4. Patient to follow in clinic per Dr. Blanch Media recs.  Ellouise Newer, PA-C [facility] 10/01/2016, 10:10 AM  Cc: [name], MD

## 2016-10-04 ENCOUNTER — Encounter: Payer: Self-pay | Admitting: Internal Medicine

## 2016-10-04 ENCOUNTER — Ambulatory Visit (AMBULATORY_SURGERY_CENTER): Payer: PPO | Admitting: Internal Medicine

## 2016-10-04 VITALS — BP 109/65 | HR 80 | Temp 96.2°F | Resp 15 | Ht 63.0 in | Wt 195.0 lb

## 2016-10-04 DIAGNOSIS — K625 Hemorrhage of anus and rectum: Secondary | ICD-10-CM

## 2016-10-04 DIAGNOSIS — Z85048 Personal history of other malignant neoplasm of rectum, rectosigmoid junction, and anus: Secondary | ICD-10-CM | POA: Diagnosis not present

## 2016-10-04 DIAGNOSIS — K635 Polyp of colon: Secondary | ICD-10-CM | POA: Diagnosis not present

## 2016-10-04 DIAGNOSIS — D123 Benign neoplasm of transverse colon: Secondary | ICD-10-CM

## 2016-10-04 DIAGNOSIS — I10 Essential (primary) hypertension: Secondary | ICD-10-CM | POA: Diagnosis not present

## 2016-10-04 DIAGNOSIS — E119 Type 2 diabetes mellitus without complications: Secondary | ICD-10-CM | POA: Diagnosis not present

## 2016-10-04 LAB — GLUCOSE, CAPILLARY
GLUCOSE-CAPILLARY: 122 mg/dL — AB (ref 65–99)
Glucose-Capillary: 163 mg/dL — ABNORMAL HIGH (ref 65–99)

## 2016-10-04 MED ORDER — SODIUM CHLORIDE 0.9 % IV SOLN: 500.0000 mL | INTRAVENOUS | Status: DC

## 2016-10-04 NOTE — Progress Notes (Signed)
Report to PACU, RN, vss, BBS= Clear.  

## 2016-10-04 NOTE — Patient Instructions (Signed)
Impression/Recommendations:  [place] handout given to patient.  Repeat colonoscopy in 3 years for surveillance.  YOU HAD AN ENDOSCOPIC PROCEDURE TODAY AT [facility]:   Refer to the procedure report that was given to you for any specific questions about what was found during the examination.  If the procedure report does not answer your questions, please call your gastroenterologist to clarify.  If you requested that your care partner not be given the details of your procedure findings, then the procedure report has been included in a sealed envelope for you to review at your convenience later.  YOU SHOULD EXPECT: Some feelings of bloating in the abdomen. Passage of more gas than usual.  Walking can help get rid of the air that was put into your GI tract during the procedure and reduce the bloating. If you had a lower endoscopy (such as a colonoscopy or flexible sigmoidoscopy) you may notice spotting of blood in your stool or on the toilet paper. If you underwent a bowel prep for your procedure, you may not have a normal bowel movement for a few days.  Please Note:  You might notice some irritation and congestion in your nose or some drainage.  This is from the oxygen used during your procedure.  There is no need for concern and it should clear up in a day or so.  SYMPTOMS TO REPORT IMMEDIATELY:   Following lower endoscopy (colonoscopy or flexible sigmoidoscopy):  Excessive amounts of blood in the stool  Significant tenderness or worsening of abdominal pains  Swelling of the abdomen that is new, acute  Fever of 100F or higher For urgent or emergent issues, a gastroenterologist can be reached at any hour by calling [phone].   DIET:  We do recommend a small meal at first, but then you may proceed to your regular diet.  Drink plenty of fluids but you should avoid alcoholic beverages for 24 hours.  ACTIVITY:  You should plan to take it easy for the rest of today and you  should NOT DRIVE or use heavy machinery until tomorrow (because of the sedation medicines used during the test).    FOLLOW UP: Our staff will call the number listed on your records the next business day following your procedure to check on you and address any questions or concerns that you may have regarding the information given to you following your procedure. If we do not reach you, we will leave a message.  However, if you are feeling well and you are not experiencing any problems, there is no need to return our call.  We will assume that you have returned to your regular daily activities without incident.  If any biopsies were taken you will be contacted by phone or by letter within the next 1-3 weeks.  Please call us at [phone] if you have not heard about the biopsies in 3 weeks.    SIGNATURES/CONFIDENTIALITY: You and/or your care partner have signed paperwork which will be entered into your electronic medical record.  These signatures attest to the fact that that the information above on your After Visit Summary has been reviewed and is understood.  Full responsibility of the confidentiality of this discharge information lies with you and/or your care-partner.

## 2016-10-04 NOTE — Op Note (Signed)
[facility] Patient Name: Tonya Vincent Procedure Date: 10/04/2016 2:44 PM MRN: [id number] Endoscopist: Docia Chuck. Henrene Pastor , MD Age: 65 Referring MD:  Date of Birth: 18-Jul-1951 Gender: Female Account #: [id number] Procedure:                Colonoscopy, with cold snare polypectomy X2 Indications:              High risk colon cancer surveillance: Personal                            history of colon cancer. Diagnosed December 2010                            (Dr. Benson [place]); subsequent neoadjuvant therapy and                            surgery. Last examination here February 2015 with                            diminutive adenoma. Recent complaints of change in                            bowel habits and transient rectal bleeding Medicines:                Monitored Anesthesia Care Procedure:                Pre-Anesthesia Assessment:                           - Prior to the procedure, a History and Physical                            was performed, and patient medications and                            allergies were reviewed. The patient's tolerance of                            previous anesthesia was also reviewed. The risks                            and benefits of the procedure and the sedation                            options and risks were discussed with the patient.                            All questions were answered, and informed consent                            was obtained. Prior Anticoagulants: The patient has                            taken no previous anticoagulant or antiplatelet  agents. ASA Grade Assessment: II - A patient with                            mild systemic disease. After reviewing the risks                            and benefits, the patient was deemed in                            satisfactory condition to undergo the procedure.                           After obtaining informed consent, the colonoscope           was passed under direct vision. Throughout the                            procedure, the patient's blood pressure, pulse, and                            oxygen saturations were monitored continuously. The                            Model CF-HQ190L [id number]) scope was introduced                            through the anus and advanced to the the cecum,                            identified by appendiceal orifice and ileocecal                            valve. The ileocecal valve, appendiceal orifice,                            and rectum were photographed. The quality of the                            bowel preparation was excellent. The colonoscopy                            was performed without difficulty. The patient                            tolerated the procedure well. The bowel preparation                            used was SUPREP. Scope In: 2:53:01 PM Scope Out: 3:04:41 PM Scope Withdrawal Time: 0 hours 9 minutes 49 seconds  Total Procedure Duration: 0 hours 11 minutes 40 seconds  Findings:                 Two polyps were found in the transverse colon. The  polyps were 3 mm in size. These polyps were removed                            with a cold snare. Resection and retrieval were                            complete.                           The exam was otherwise without abnormality on                            direct and retroflexion views. Complications:            No immediate complications. Estimated blood loss:                            None. Estimated Blood Loss:     Estimated blood loss: none. Impression:               - Two 3 mm polyps in the transverse colon, removed                            with a cold snare. Resected and retrieved.                           - The examination was otherwise normal on direct                            and retroflexion views. Recommendation:           - Repeat colonoscopy in 3 years for  surveillance.                           - Patient has a contact number available for                            emergencies. The signs and symptoms of potential                            delayed complications were discussed with the                            patient. Return to normal activities tomorrow.                            Written discharge instructions were provided to the                            patient.                           - Resume previous diet.                           - Continue present medications.                           -  Await pathology results. Docia Chuck. Henrene Pastor, MD 10/04/2016 3:14:16 PM This report has been signed electronically.

## 2016-10-04 NOTE — Progress Notes (Signed)
Called to room to assist during endoscopic procedure.  Patient ID and intended procedure confirmed with present staff. Received instructions for my participation in the procedure from the performing physician.  

## 2016-10-05 ENCOUNTER — Telehealth: Payer: Self-pay | Admitting: *Deleted

## 2016-10-05 NOTE — Telephone Encounter (Signed)
  Follow up Call-  Call back number 10/04/2016  Post procedure Call Back phone  # [phone]  Permission to leave phone message Yes  Some recent data might be hidden     Patient questions:  Do you have a fever, pain , or abdominal swelling? No. Pain Score  0 *  Have you tolerated food without any problems? Yes.    Have you been able to return to your normal activities? Yes.    Do you have any questions about your discharge instructions: Diet   No. Medications  No. Follow up visit  No.  Do you have questions or concerns about your Care? No.  Actions: * If pain score is 4 or above: No action needed, pain <4.

## 2016-10-09 ENCOUNTER — Encounter: Payer: Self-pay | Admitting: Internal Medicine

## 2016-10-13 LAB — BASIC METABOLIC PANEL: GLUCOSE: 208 mg/dL

## 2016-11-05 DIAGNOSIS — E089 Diabetes mellitus due to underlying condition without complications: Secondary | ICD-10-CM | POA: Diagnosis not present

## 2016-11-05 DIAGNOSIS — I10 Essential (primary) hypertension: Secondary | ICD-10-CM | POA: Diagnosis not present

## 2016-11-07 ENCOUNTER — Other Ambulatory Visit (HOSPITAL_COMMUNITY): Payer: Self-pay | Admitting: Family Medicine

## 2016-11-07 ENCOUNTER — Ambulatory Visit (HOSPITAL_COMMUNITY)
Admission: RE | Admit: 2016-11-07 | Discharge: 2016-11-07 | Disposition: A | Discharge status: Home / Self Care | Payer: PPO | Source: Ambulatory Visit | Attending: Family Medicine | Admitting: Family Medicine

## 2016-11-07 DIAGNOSIS — M310 Hypersensitivity angiitis: Secondary | ICD-10-CM | POA: Insufficient documentation

## 2016-11-07 DIAGNOSIS — M130 Polyarthritis, unspecified: Secondary | ICD-10-CM

## 2016-11-07 DIAGNOSIS — M25551 Pain in right hip: Secondary | ICD-10-CM | POA: Diagnosis not present

## 2016-11-08 DIAGNOSIS — E089 Diabetes mellitus due to underlying condition without complications: Secondary | ICD-10-CM | POA: Diagnosis not present

## 2016-11-08 DIAGNOSIS — R635 Abnormal weight gain: Secondary | ICD-10-CM | POA: Diagnosis not present

## 2016-11-08 DIAGNOSIS — M130 Polyarthritis, unspecified: Secondary | ICD-10-CM | POA: Diagnosis not present

## 2016-11-08 DIAGNOSIS — I10 Essential (primary) hypertension: Secondary | ICD-10-CM | POA: Diagnosis not present

## 2016-11-21 DIAGNOSIS — M8589 Other specified disorders of bone density and structure, multiple sites: Secondary | ICD-10-CM | POA: Diagnosis not present

## 2016-11-21 DIAGNOSIS — Z1231 Encounter for screening mammogram for malignant neoplasm of breast: Secondary | ICD-10-CM | POA: Diagnosis not present

## 2016-12-08 DIAGNOSIS — R635 Abnormal weight gain: Secondary | ICD-10-CM | POA: Diagnosis not present

## 2016-12-08 DIAGNOSIS — E089 Diabetes mellitus due to underlying condition without complications: Secondary | ICD-10-CM | POA: Diagnosis not present

## 2016-12-08 DIAGNOSIS — F900 Attention-deficit hyperactivity disorder, predominantly inattentive type: Secondary | ICD-10-CM | POA: Diagnosis not present

## 2016-12-08 DIAGNOSIS — I10 Essential (primary) hypertension: Secondary | ICD-10-CM | POA: Diagnosis not present

## 2016-12-10 DIAGNOSIS — H2513 Age-related nuclear cataract, bilateral: Secondary | ICD-10-CM | POA: Diagnosis not present

## 2016-12-10 DIAGNOSIS — E119 Type 2 diabetes mellitus without complications: Secondary | ICD-10-CM | POA: Diagnosis not present

## 2016-12-10 DIAGNOSIS — H5203 Hypermetropia, bilateral: Secondary | ICD-10-CM | POA: Diagnosis not present

## 2017-01-01 ENCOUNTER — Telehealth: Payer: Self-pay | Admitting: Hematology and Oncology

## 2017-01-01 ENCOUNTER — Ambulatory Visit (HOSPITAL_BASED_OUTPATIENT_CLINIC_OR_DEPARTMENT_OTHER): Payer: PPO | Admitting: Hematology and Oncology

## 2017-01-01 ENCOUNTER — Other Ambulatory Visit (HOSPITAL_BASED_OUTPATIENT_CLINIC_OR_DEPARTMENT_OTHER): Payer: PPO

## 2017-01-01 ENCOUNTER — Encounter: Payer: Self-pay | Admitting: Hematology and Oncology

## 2017-01-01 VITALS — BP 160/95 | HR 70 | Temp 98.1°F | Resp 19 | Ht 63.0 in | Wt 196.0 lb

## 2017-01-01 DIAGNOSIS — Z85048 Personal history of other malignant neoplasm of rectum, rectosigmoid junction, and anus: Secondary | ICD-10-CM | POA: Diagnosis not present

## 2017-01-01 DIAGNOSIS — I10 Essential (primary) hypertension: Secondary | ICD-10-CM | POA: Diagnosis not present

## 2017-01-01 DIAGNOSIS — R718 Other abnormality of red blood cells: Secondary | ICD-10-CM

## 2017-01-01 LAB — COMPREHENSIVE METABOLIC PANEL
ALT: 16 U/L (ref 0–55)
AST: 13 U/L (ref 5–34)
Albumin: 4 g/dL (ref 3.5–5.0)
Alkaline Phosphatase: 89 U/L (ref 40–150)
Anion Gap: 11 mEq/L (ref 3–11)
BUN: 18.2 mg/dL (ref 7.0–26.0)
CALCIUM: 9.5 mg/dL (ref 8.4–10.4)
CHLORIDE: 104 meq/L (ref 98–109)
CO2: 25 mEq/L (ref 22–29)
CREATININE: 1.3 mg/dL — AB (ref 0.6–1.1)
EGFR: 49 mL/min/{1.73_m2} — ABNORMAL LOW (ref >90)
GLUCOSE: 106 mg/dL (ref 70–140)
Potassium: 3.7 mEq/L (ref 3.5–5.1)
SODIUM: 141 meq/L (ref 136–145)
Total Bilirubin: 0.29 mg/dL (ref 0.20–1.20)
Total Protein: 7.5 g/dL (ref 6.4–8.3)

## 2017-01-01 LAB — CBC WITH DIFFERENTIAL/PLATELET
BASO%: 0.9 % (ref 0.0–2.0)
Basophils Absolute: 0 10*3/uL (ref 0.0–0.1)
EOS%: 3 % (ref 0.0–7.0)
Eosinophils Absolute: 0.1 10*3/uL (ref 0.0–0.5)
HCT: 39.2 % (ref 34.8–46.6)
HEMOGLOBIN: 12.6 g/dL (ref 11.6–15.9)
LYMPH#: 1.3 10*3/uL (ref 0.9–3.3)
LYMPH%: 28.4 % (ref 14.0–49.7)
MCH: 25.4 pg (ref 25.1–34.0)
MCHC: 32.3 g/dL (ref 31.5–36.0)
MCV: 78.7 fL — ABNORMAL LOW (ref 79.5–101.0)
MONO#: 0.5 10*3/uL (ref 0.1–0.9)
MONO%: 10.3 % (ref 0.0–14.0)
NEUT#: 2.7 10*3/uL (ref 1.5–6.5)
NEUT%: 57.4 % (ref 38.4–76.8)
Platelets: 301 10*3/uL (ref 145–400)
RBC: 4.98 10*6/uL (ref 3.70–5.45)
RDW: 17.9 % — AB (ref 11.2–14.5)
WBC: 4.8 10*3/uL (ref 3.9–10.3)

## 2017-01-01 LAB — CEA (IN HOUSE-CHCC): CEA (CHCC-IN HOUSE): 1.12 ng/mL (ref 0.00–5.00)

## 2017-01-01 NOTE — Telephone Encounter (Signed)
Pt confirmed appt and received avs °

## 2017-01-01 NOTE — Assessment & Plan Note (Addendum)
Clinically, she has no signs of cancer recurrence.  She is a long-term cancer survivor I reviewed the current NCCN guidelines with the patient. Her last colonoscopy was in 2017. Next due 2020 due to presence of polyps Per guidelines, there is no benefit for further surveillance imaging in the future. We discussed transitioning her care to cancer survivorship with the patient wants to continue follow-up with me. I will see her next year during springtime I will get her port removed

## 2017-01-01 NOTE — Assessment & Plan Note (Signed)
She had history of rectal bleeding. The microcytosis could be early signs of iron deficiency anemia. I recommend she start oral iron supplement 3 times a week. I warned her about risk of constipation

## 2017-01-01 NOTE — Progress Notes (Signed)
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 after neoadjuvant chemotherapy and radiation therapy   Primary site: Colon and Rectum   Staging method: AJCC 7th Edition   Clinical: Stage I (T2, N0, M0) signed by [name], MD on 12/28/2013  1:49 PM   Pathologic: Stage I (T2, N0, cM0) signed by [name], MD on 12/28/2013  1:49 PM   Summary: Stage I (T2, N0, cM0)       History of rectal cancer   11/17/2009 Procedure    Colonoscopy and rectal biopsy confirmed moderately differentiated adenocarcinoma      11/18/2009 Imaging    Transrectal ultrasound place staging T3 lesion      11/18/2009 Imaging    Staging CT scan of the chest, abdomen and pelvis showed multiple lesions in the liver as well as paraspinal mass of unknown etiology      12/05/2009 - 04/03/2010 Chemotherapy    The patient completed new adjuvant chemotherapy with 5-FU and radiation therapy      12/14/2009 Imaging    PET CT scan show faint metabolic activity in the paraspinal mass with no activity in the liver      04/24/2010 Surgery    She had surgical resection with negative margins. Final pathology was T2, N0, M0 (down staged by chemoradiation therapy from T3, N0, M0)      11/19/2012 Imaging    MRI of the liver confirmed benign hemangioma      01/08/2014 Procedure    Colonoscopy and biopsy was negative      12/21/2014 Imaging    CT scan showed no recurrence of colon cancer. Incidentally, paraspinal mass is slightly larger.      10/04/2016 Procedure    She had repeat colonoscopy which showed two 3 mm polyps in the transverse colon, removed with a cold snare. Resected and retrieved. The examination was otherwise normal on direct and retroflexion views.      10/04/2016 Pathology Results    Surgical [P], transverse, polyp(s) - SERRATED POLYP WITH FOCAL FEATURES SUGGESTIVE OF SESSILE SERRATED POLYP/ADENOMA, ONE FRAGMENT. - HYPERPLASTIC POLYP WITHOUT DYSPLASIA, ONE FRAGMENT. - BENIGN COLORECTAL MUCOSA WITHOUT DYSPLASIA, ONE FRAGMENT. - SEE COMMENT. Microscopic Comment After routine specimen processing, three fragments are identified. One of the fragments demonstrates a serrated horizontally situated crypt, which is suggestive of superficial sampling of a sessile serrated polyp/adenoma (the differential includes another fragment of hyperplastic polyp). Although this is the case, the finding is very focal and not definitive.        INTERVAL HISTORY: Please see below for problem oriented charting. She returns for further follow-up. She had recent rectal bleeding and underwent colonoscopy in November which came back benign. She has very little iron rich diet. She denies other forms of bleeding. She has small stool caliber after prior surgery and radiation but denies recent constipation.  REVIEW OF SYSTEMS:   Constitutional: Denies fevers, chills or abnormal weight loss Eyes: Denies blurriness of vision Ears, nose, mouth, throat, and face: Denies mucositis or sore throat Respiratory: Denies cough, dyspnea or wheezes Cardiovascular: Denies palpitation, chest discomfort or lower extremity swelling Gastrointestinal:  Denies nausea, heartburn or change in bowel habits Skin: Denies abnormal skin rashes Lymphatics: Denies new lymphadenopathy or easy bruising Neurological:Denies numbness, tingling or new weaknesses Behavioral/Psych: Mood  is stable, no new changes  All other systems were reviewed with the patient and are negative.  I have reviewed the past medical history, past surgical history, social history and family history with the patient and they are unchanged from previous note.  ALLERGIES:  is allergic to antibacterial hand soap [triclosan]  and norgesic [orphenadrine-aspirin-caffeine].  MEDICATIONS:  Current Outpatient Prescriptions  Medication Sig Dispense Refill  . aspirin EC 81 MG tablet Take 81 mg by mouth daily.    Marland Kitchen docusate sodium (COLACE) 250 MG capsule Take 250 mg by mouth daily.    . empagliflozin (JARDIANCE) 25 MG TABS tablet Take 25 mg by mouth daily.    Marland Kitchen ibuprofen (ADVIL,MOTRIN) 800 MG tablet Take 800 mg by mouth every 8 (eight) hours as needed.    . methylphenidate 54 MG PO CR tablet Take 54 mg by mouth every morning.    . Olmesartan-Amlodipine-HCTZ 40-10-25 MG TABS Take 0.5 tablets by mouth daily.     . TRULICITY 1.5 VO/3.5KK SOPN Inject 1.5 mg into the skin once a week.     No current facility-administered medications for this visit.     PHYSICAL EXAMINATION: ECOG PERFORMANCE STATUS: 1 - Symptomatic but completely ambulatory  Vitals:   01/01/17 0855  BP: (!) 160/95  Pulse: 70  Resp: 19  Temp: 98.1 F (36.7 C)   Filed Weights   01/01/17 0855  Weight: 196 lb (88.9 kg)    GENERAL:alert, no distress and comfortable SKIN: skin color, texture, turgor are normal, no rashes or significant lesions EYES: normal, Conjunctiva are pink and non-injected, sclera clear OROPHARYNX:no exudate, no erythema and lips, buccal mucosa, and tongue normal  NECK: supple, thyroid normal size, non-tender, without nodularity LYMPH:  no palpable lymphadenopathy in the cervical, axillary or inguinal LUNGS: clear to auscultation and percussion with normal breathing effort HEART: regular rate & rhythm and no murmurs and no lower extremity edema ABDOMEN:abdomen soft, non-tender and normal bowel sounds Musculoskeletal:no cyanosis of digits and no clubbing  NEURO: alert & oriented x 3 with fluent speech, no focal motor/sensory deficits  LABORATORY DATA:  I have reviewed the data as listed    Component Value Date/Time   NA 141 01/01/2017 0841   K 3.7 01/01/2017 0841   CL 99 11/05/2012 0842   CO2 25 01/01/2017 0841    GLUCOSE 106 01/01/2017 0841   GLUCOSE 167 (H) 11/05/2012 0842   BUN 18.2 01/01/2017 0841   CREATININE 1.3 (H) 01/01/2017 0841   CALCIUM 9.5 01/01/2017 0841   PROT 7.5 01/01/2017 0841   ALBUMIN 4.0 01/01/2017 0841   AST 13 01/01/2017 0841   ALT 16 01/01/2017 0841   ALKPHOS 89 01/01/2017 0841   BILITOT 0.29 01/01/2017 0841   GFRNONAA 50 (L) 04/29/2010 0915   GFRAA  04/29/2010 0915    >60        The eGFR has been calculated using the MDRD equation. This calculation has not been validated in all clinical situations. eGFR's persistently <60 mL/min signify possible Chronic Kidney Disease.    No results found for: SPEP, UPEP  Lab Results  Component Value Date   WBC 4.8 01/01/2017   NEUTROABS 2.7 01/01/2017   HGB 12.6 01/01/2017   HCT 39.2 01/01/2017   MCV 78.7 (L) 01/01/2017   PLT 301 01/01/2017      Chemistry      Component Value Date/Time   NA 141 01/01/2017 0841   K 3.7 01/01/2017 0841   CL 99 11/05/2012 0842   CO2 25  01/01/2017 0841   BUN 18.2 01/01/2017 0841   CREATININE 1.3 (H) 01/01/2017 0841   GLU 208 10/13/2016      Component Value Date/Time   CALCIUM 9.5 01/01/2017 0841   ALKPHOS 89 01/01/2017 0841   AST 13 01/01/2017 0841   ALT 16 01/01/2017 0841   BILITOT 0.29 01/01/2017 0841      ASSESSMENT & PLAN:  History of rectal cancer Clinically, she has no signs of cancer recurrence.  She is a long-term cancer survivor I reviewed the current NCCN guidelines with the patient. Her last colonoscopy was in 2017. Next due 2020 due to presence of polyps Per guidelines, there is no benefit for further surveillance imaging in the future. We discussed transitioning her care to cancer survivorship with the patient wants to continue follow-up with me. I will see her next year during springtime I will get her port removed  Essential hypertension she will continue current medical management. I recommend close follow-up with primary care doctor for medication  adjustment. I recommend she start 81 mg aspirin for cardiac prevention  Microcytosis She had history of rectal bleeding. The microcytosis could be early signs of iron deficiency anemia. I recommend she start oral iron supplement 3 times a week. I warned her about risk of constipation   Orders Placed This Encounter  Procedures  . IR Removal Tun Access W/ Port W/O FL    Standing Status:   Future    Standing Expiration Date:   03/01/2018    Order Specific Question:   Reason for exam:    Answer:   no need port    Order Specific Question:   Preferred Imaging Location?    Answer:   [facility]  . CBC with Differential/Platelet    Standing Status:   Future    Standing Expiration Date:   02/08/2018  . Comprehensive metabolic panel    Standing Status:   Future    Standing Expiration Date:   02/08/2018  . CEA    Standing Status:   Future    Standing Expiration Date:   02/08/2018   All questions were answered. The patient knows to call the clinic with any problems, questions or concerns. No barriers to learning was detected. I spent 15 minutes counseling the patient face to face. The total time spent in the appointment was 20 minutes and more than 50% was on counseling and review of test results     [name], MD 01/01/2017 11:47 AM

## 2017-01-01 NOTE — Assessment & Plan Note (Signed)
she will continue current medical management. I recommend close follow-up with primary care doctor for medication adjustment. I recommend she start 81 mg aspirin for cardiac prevention

## 2017-01-02 ENCOUNTER — Telehealth: Payer: Self-pay | Admitting: Hematology and Oncology

## 2017-01-02 LAB — CEA: CEA1: 1.3 ng/mL (ref 0.0–4.7)

## 2017-01-02 NOTE — Telephone Encounter (Signed)
Patient called and said that her next appointment with Dr Alvy Bimler should be 02/06/2018 and she has the appointment as 02/06/2017

## 2017-02-06 ENCOUNTER — Ambulatory Visit: Payer: PPO | Admitting: Hematology and Oncology

## 2017-02-06 ENCOUNTER — Other Ambulatory Visit: Payer: PPO

## 2017-04-02 DIAGNOSIS — M25552 Pain in left hip: Secondary | ICD-10-CM | POA: Diagnosis not present

## 2017-04-02 DIAGNOSIS — M79651 Pain in right thigh: Secondary | ICD-10-CM | POA: Diagnosis not present

## 2017-04-02 DIAGNOSIS — M79652 Pain in left thigh: Secondary | ICD-10-CM | POA: Diagnosis not present

## 2017-04-05 ENCOUNTER — Ambulatory Visit (HOSPITAL_COMMUNITY)
Admission: RE | Admit: 2017-04-05 | Discharge: 2017-04-05 | Disposition: A | Discharge status: Home / Self Care | Payer: PPO | Source: Ambulatory Visit | Attending: Family Medicine | Admitting: Family Medicine

## 2017-04-05 ENCOUNTER — Other Ambulatory Visit (HOSPITAL_COMMUNITY): Payer: Self-pay | Admitting: Family Medicine

## 2017-04-05 DIAGNOSIS — M25512 Pain in left shoulder: Secondary | ICD-10-CM | POA: Diagnosis not present

## 2017-04-05 DIAGNOSIS — M25552 Pain in left hip: Secondary | ICD-10-CM

## 2017-04-09 DIAGNOSIS — E089 Diabetes mellitus due to underlying condition without complications: Secondary | ICD-10-CM | POA: Diagnosis not present

## 2017-04-09 DIAGNOSIS — F900 Attention-deficit hyperactivity disorder, predominantly inattentive type: Secondary | ICD-10-CM | POA: Diagnosis not present

## 2017-04-09 DIAGNOSIS — R635 Abnormal weight gain: Secondary | ICD-10-CM | POA: Diagnosis not present

## 2017-04-09 DIAGNOSIS — I10 Essential (primary) hypertension: Secondary | ICD-10-CM | POA: Diagnosis not present

## 2017-04-13 DIAGNOSIS — F900 Attention-deficit hyperactivity disorder, predominantly inattentive type: Secondary | ICD-10-CM | POA: Diagnosis not present

## 2017-04-13 DIAGNOSIS — E089 Diabetes mellitus due to underlying condition without complications: Secondary | ICD-10-CM | POA: Diagnosis not present

## 2017-04-13 DIAGNOSIS — M130 Polyarthritis, unspecified: Secondary | ICD-10-CM | POA: Diagnosis not present

## 2017-04-13 DIAGNOSIS — E0800 Diabetes mellitus due to underlying condition with hyperosmolarity without nonketotic hyperglycemic-hyperosmolar coma (NKHHC): Secondary | ICD-10-CM | POA: Diagnosis not present

## 2017-04-13 DIAGNOSIS — I10 Essential (primary) hypertension: Secondary | ICD-10-CM | POA: Diagnosis not present

## 2017-04-13 DIAGNOSIS — F331 Major depressive disorder, recurrent, moderate: Secondary | ICD-10-CM | POA: Diagnosis not present

## 2017-04-13 DIAGNOSIS — C20 Malignant neoplasm of rectum: Secondary | ICD-10-CM | POA: Diagnosis not present

## 2017-04-13 DIAGNOSIS — E876 Hypokalemia: Secondary | ICD-10-CM | POA: Diagnosis not present

## 2017-05-20 DIAGNOSIS — E089 Diabetes mellitus due to underlying condition without complications: Secondary | ICD-10-CM | POA: Diagnosis not present

## 2017-05-20 DIAGNOSIS — I10 Essential (primary) hypertension: Secondary | ICD-10-CM | POA: Diagnosis not present

## 2017-05-20 DIAGNOSIS — F331 Major depressive disorder, recurrent, moderate: Secondary | ICD-10-CM | POA: Diagnosis not present

## 2017-07-04 ENCOUNTER — Ambulatory Visit (HOSPITAL_COMMUNITY)
Admission: RE | Admit: 2017-07-04 | Discharge: 2017-07-04 | Disposition: A | Discharge status: Home / Self Care | Payer: PPO | Source: Ambulatory Visit | Attending: Family Medicine | Admitting: Family Medicine

## 2017-07-04 ENCOUNTER — Other Ambulatory Visit (HOSPITAL_COMMUNITY): Payer: Self-pay | Admitting: Family Medicine

## 2017-07-04 DIAGNOSIS — M7651 Patellar tendinitis, right knee: Secondary | ICD-10-CM

## 2017-07-04 DIAGNOSIS — M25461 Effusion, right knee: Secondary | ICD-10-CM | POA: Insufficient documentation

## 2017-07-04 DIAGNOSIS — M25861 Other specified joint disorders, right knee: Secondary | ICD-10-CM | POA: Insufficient documentation

## 2017-07-22 ENCOUNTER — Ambulatory Visit (HOSPITAL_COMMUNITY): Payer: PPO | Attending: Family Medicine

## 2017-07-22 ENCOUNTER — Encounter (HOSPITAL_COMMUNITY): Payer: Self-pay

## 2017-07-22 DIAGNOSIS — M6281 Muscle weakness (generalized): Secondary | ICD-10-CM

## 2017-07-22 DIAGNOSIS — G8929 Other chronic pain: Secondary | ICD-10-CM | POA: Insufficient documentation

## 2017-07-22 DIAGNOSIS — M25561 Pain in right knee: Secondary | ICD-10-CM | POA: Insufficient documentation

## 2017-07-22 DIAGNOSIS — R262 Difficulty in walking, not elsewhere classified: Secondary | ICD-10-CM | POA: Insufficient documentation

## 2017-07-22 NOTE — Patient Instructions (Signed)
  Prone Quad Stretch  Lie down flat on your stomach. Wrap a strap (belt, towel, dog leash) around the top of one of your feet and pull the strap across your opposite shoulder so that your knee starts to curl up to your body. Pull until a stretch is felt across the front of your thigh.     Perform 2-3x/day, 3-5 stretches holding for 15-30 seconds

## 2017-07-22 NOTE — Therapy (Addendum)
[place], [place], [address] Phone: [phone]   Fax:  [phone]  Physical Therapy Evaluation  Patient Details  Name: Tonya Vincent MRN: [id number] Date of Birth: Oct 01, 1951 Referring Provider: [name], MD  Encounter Date: 07/22/2017      PT End of Session - 07/22/17 0946    Visit Number 1   Number of Visits 9   Date for PT Re-Evaluation 08/19/17   Authorization Type [employer]   Authorization Time Period 07/22/17 to 08/19/17   PT Start Time 0903   PT Stop Time 0943   PT Time Calculation (min) 40 min   Activity Tolerance Patient tolerated treatment well;No increased pain   Behavior During Therapy WFL for tasks assessed/performed      Past Medical History:  Diagnosis Date  . Anemia   . Anxiety   . Depression   . Diabetes mellitus   . Heart murmur   . History of rectal cancer 10/04/2011  . Hypertension   . Malignant neoplasm of rectum ([facility]) 10/04/2011  . 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 rectal ca` dx'd 11/16/09   xrt comp 01/2010; chemo comp 01/2011  . Tubular adenoma of colon [id number]    Past Surgical History:  Procedure Laterality Date  . ABDOMINAL HYSTERECTOMY     for uterine fibroids  . COLON SURGERY N/A 04/24/2010   "took out half rectum"; rectal cancer  . HAND SURGERY     carpal tunnel- left  . [facility]  . PORTACATH PLACEMENT     2011    There were no vitals filed for this visit.       Subjective Assessment - 07/22/17 0908    Subjective Pt states she has been participating in [place] for about 3 years; she states she was in the class and she states that "she tried to go one way and your knee went the other." She denies a pop, but states it felt like it was a catch in her R knee. She reports 2 weeks ago when she was just walking and she felt a catch in that R knee again when it sort of buckled. She reports her pain as located primarily at her anterior knee  and it feels like it is under the knee cap. She reports having the most difficulty walking, stairs, and getting in and out of her vehicles. She reports Vick's vapor rub and red oil makes it feel better.    Limitations Walking;House hold activities   How long can you sit comfortably? depends   How long can you stand comfortably? not limited by the knee   How long can you walk comfortably? LBP limits her before the knee   Diagnostic tests x-rays   Patient Stated Goals make her knee stop hurting   Currently in Pain? Yes   Pain Score 3    Pain Location Knee   Pain Orientation Right   Pain Descriptors / Indicators Aching;Tingling   Pain Type Acute pain   Pain Onset 1 to 4 weeks ago   Pain Frequency Intermittent   Aggravating Factors  walking, stairs, in and out of car   Pain Relieving Factors vick's vapor rub and red oil   Effect of Pain on Daily Activities increases            [facility] PT Assessment - 07/22/17 0001      Assessment   Medical Diagnosis Patellar tendinitis   Referring Provider [name], MD  Onset Date/Surgical Date 07/08/17   Next MD Visit 08/25/17   Prior Therapy none     Precautions   Precautions None     Restrictions   Weight Bearing Restrictions No     Balance Screen   Has the patient fallen in the past 6 months No   Has the patient had a decrease in activity level because of a fear of falling?  No   Is the patient reluctant to leave their home because of a fear of falling?  No     Prior Function   Level of Independence Independent   Vocation Requirements private swim lessons   Leisure play yard games     Observation/Other Assessments   Focus on Therapeutic Outcomes (FOTO)  1% limitation     Observation/Other Assessments-Edema    Edema Circumferential     Circumferential Edema   Circumferential - Right 15.5" joint line   Circumferential - Left  15.5" joint line     Functional Tests   Functional tests Other     Other:   Other/ Comments  Stairs: step-to ascent/descent; antalgia with reciprocal and increased pain with decreased control with descent; increased R toe out and R knee valgus throughout descent     ROM / Strength   AROM / PROM / Strength AROM;Strength     AROM   Right Knee Extension 0   Right Knee Flexion 125     Strength   Right/Left Hip Right;Left   Right Hip Flexion 5/5   Right Hip Extension 4+/5   Right Hip ABduction 4+/5   Left Hip Extension 4/5   Left Hip ABduction 4+/5   Right/Left Knee Right;Left   Right Knee Flexion 5/5  pain   Right Knee Extension 5/5  pain   Left Knee Flexion 5/5   Left Knee Extension 5/5   Right Ankle Dorsiflexion 5/5   Left Ankle Dorsiflexion 5/5     Flexibility   Soft Tissue Assessment /Muscle Length yes   Hamstrings WNL   Quadriceps +Ely's BLE, recreated R knee pain   ITB - Ober's R     Palpation   Patella mobility WNL, non-painful   Palpation comment increased soft tissue restrictions and tenderness to palpation of R distal quads, ITB, adductors, patellar tendon; Joint mobs WNL, non-painful     Special Tests    Special Tests Knee Special Tests;Meniscus Tests   Meniscus Tests other;McMurray Test     McMurray Test   Findings Negative   Side Right     other   Findings Positive   Comments Thessaly's     Ambulation/Gait   Ambulation/Gait Yes   Ambulation Distance (Feet) 950 Feet  3MWT   Gait Pattern Step-through pattern;Decreased stance time - right;Decreased step length - left;Antalgic;Trendelenburg;Wide base of support  bil toe out R>L     Balance   Balance Assessed Yes     Static Standing Balance   Static Standing - Balance Support No upper extremity supported   Static Standing Balance -  Activities  Single Leg Stance - Right Leg;Single Leg Stance - Left Leg   Static Standing - Comment/# of Minutes R:30 sec L: 30 sec         Objective measurements completed on examination: See above findings.            PT Education - 07/22/17 0944     Education provided Yes   Education Details exam findings, POC, HEP   Person(s) Educated Patient   Methods Explanation;Demonstration;Handout  Comprehension Verbalized understanding;Returned demonstration          PT Short Term Goals - 07/22/17 1001      PT SHORT TERM GOAL #1   Title Pt will be independent with HEP and perform consistently in order to maximize return to PLOF and decrease risk of reinjury.   Time 2   Period Weeks   Status New   Target Date 08/05/17     PT SHORT TERM GOAL #2   Title Pt will have improved MMT of all muscle groups tested to 5/5 in order to decrease pain and maximize functional tasks.   Time 2   Period Weeks   Status New           PT Long Term Goals - 07/22/17 1002      PT LONG TERM GOAL #1   Title Pt will be able to ascend/descend 12 stairs reciprocally with no pain, good eccentric control, and good mechanics throughout in order for pt to access her basement.   Time 4   Period Weeks   Status New   Target Date 08/19/17     PT LONG TERM GOAL #2   Title Pt will be able to perform 3MWT with no reports of pain and with min to no gait deviations to decrease pain and maximzie community participation.   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   Time 4   Period Weeks   Status New                Plan - 07/22/17 0488    Clinical Impression Statement Pt is pleasant 66 YO F who presents to [facility] with c/o R knee pain. She originally injured it during a Zumba class 2 years ago which subsided with ibuprofen and rest. Two weeks ago she states she was walking and felt it catch and has been having pain ever since. Pt currently does not have any deficits in AROM or increased edema at joint line. She does present with deficits in MMT, especially of proximal  musculature, functional strength and tasks, gait, and increased soft tissue restrictions with tenderness to palpation. Pt needs skilled PT intervention to address these deficits in order to decrease pain and maximize overall function.   History and Personal Factors relevant to plan of care: motivated, active, DM, HTN, h/o rectal cancer; in remission since 2011   Clinical Presentation Stable   Clinical Presentation due to: full AROM, no edema, increased pain, soft tissue restrictions with tenderness to palpation, MMT, functional strength and tasks, gait   Clinical Decision Making Low   Rehab Potential Good   PT Frequency 2x / week   PT Duration 4 weeks   PT Treatment/Interventions ADLs/Self Care Home Management;Cryotherapy;Electrical Stimulation;Moist Heat;Gait training;Stair training;Functional mobility training;Therapeutic activities;Therapeutic exercise;Balance training;Neuromuscular re-education;Patient/family education;Manual techniques;Passive range of motion;Dry needling;Energy conservation;Taping   PT Next Visit Plan review goals and HEP, initiate BLE strengthening and functional strengthening (bridging, clams, sit <> stands, step ups/downs, sidestepping, etc.), SLS with vectors, SLS on foam; manual for soft tissue restrictions of R distal quad, ITB, distal adductors   PT Home Exercise Plan eval: prone quad stretch with rope   Consulted and Agree with Plan of Care Patient      Patient will benefit  from skilled therapeutic intervention in order to improve the following deficits and impairments:  Abnormal gait, Decreased strength, Difficulty walking, Increased fascial restricitons, Increased muscle spasms, Impaired flexibility, Improper body mechanics, Postural dysfunction, Pain  Visit Diagnosis: Chronic pain of right knee - Plan: PT plan of care cert/re-cert  Muscle weakness (generalized) - Plan: PT plan of care cert/re-cert  Difficulty in walking, not elsewhere classified - Plan: PT plan  of care cert/re-cert       G-codes: Mobility: walking and moving around Current status: At least 20% but less than 40% impaired, limited, or restricted Goal status: at least 1% but less than 20% impaired, limited, or restricted     Problem List Patient Active Problem List   Diagnosis Date Noted  . Microcytosis 01/01/2017  . Essential hypertension 01/02/2016  . Change in stool caliber 12/31/2014  . 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 Anemia in chronic illness 12/29/2014  . History of rectal cancer 10/04/2011     Geraldine Solar PT, DPT  [facility] [address] [place], [place], [address] Phone: [phone]   Fax:  [phone]  Name: WENDI LASTRA MRN: [id number] Date of Birth: [birth year]

## 2017-07-24 ENCOUNTER — Ambulatory Visit (HOSPITAL_COMMUNITY): Payer: PPO | Admitting: Physical Therapy

## 2017-07-24 DIAGNOSIS — M25561 Pain in right knee: Principal | ICD-10-CM

## 2017-07-24 DIAGNOSIS — M6281 Muscle weakness (generalized): Secondary | ICD-10-CM

## 2017-07-24 DIAGNOSIS — G8929 Other chronic pain: Secondary | ICD-10-CM

## 2017-07-24 DIAGNOSIS — R262 Difficulty in walking, not elsewhere classified: Secondary | ICD-10-CM

## 2017-07-24 NOTE — Patient Instructions (Signed)
POSITION: Single Leg Balance:     Stand on one leg near a sink or counter top (so you can easily use hands if needed).  Hold for 30 seconds each   Functional Quadriceps: Sit to Stand    Sit on edge of chair, feet flat on floor. Stand upright, extending knees fully.Repeat _10_ times per set. Do _2_ sets per session. Do _2__ sessions per day.  Bridge    Lie back, legs bent. Inhale, pressing hips up. Keeping ribs in, lengthen lower back. Exhale, rolling down along spine from top.Repeat _10__ times. Do _2_ sessions per day.

## 2017-07-24 NOTE — Therapy (Signed)
[place], [place], [address] Phone: [phone]   Fax:  [phone]  Physical Therapy Treatment  Patient Details  Name: Tonya Vincent MRN: [id number] Date of Birth: [birth year] Referring Provider: [name], MD  Encounter Date: 07/24/2017      PT End of Session - 07/24/17 1535    Visit Number 2   Number of Visits 9   Date for PT Re-Evaluation 08/19/17   Authorization Type [employer]   Authorization Time Period 07/22/17 to 08/19/17   PT Start Time 1436   PT Stop Time 1520   PT Time Calculation (min) 44 min   Activity Tolerance Patient tolerated treatment well;No increased pain   Behavior During Therapy WFL for tasks assessed/performed      Past Medical History:  Diagnosis Date  . Anemia   . Anxiety   . Depression   . Diabetes mellitus   . Heart murmur   . History of rectal cancer 10/04/2011  . Hypertension   . Malignant neoplasm of rectum ([place]) 10/04/2011  . 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 rectal ca` dx'd 11/16/09   xrt comp 01/2010; chemo comp 01/2011  . Tubular adenoma of colon [id number]    Past Surgical History:  Procedure Laterality Date  . ABDOMINAL HYSTERECTOMY     for uterine fibroids  . COLON SURGERY N/A 04/24/2010   "took out half rectum"; rectal cancer  . HAND SURGERY     carpal tunnel- left  . [place]  . PORTACATH PLACEMENT     2011    There were no vitals filed for this visit.      Subjective Assessment - 07/24/17 1457    Subjective PT reports compliance with quad stretch given at last session.  Went to tennis courts yesterday and did some activity that flaired her Rt knee up to 10/10.  States she rubbed red oil and Vicks on it and it helped reduce the pain to 2/10.   Currently in Pain? Yes   Pain Score 2    Pain Location Knee   Pain Orientation Right   Pain Descriptors / Indicators Aching   Pain Type Acute pain   Aggravating Factors  weight  bearing                         [facility] Adult PT Treatment/Exercise - 07/24/17 0001      Knee/Hip Exercises: Stretches   Active Hamstring Stretch 3 reps;20 seconds   Active Hamstring Stretch Limitations with rope   Quad Stretch Both;3 reps;20 seconds   Quad Stretch Limitations prone with rope (HEP)     Knee/Hip Exercises: Standing   SLS 30" each without UE level surface   Other Standing Knee Exercises tandem stance 30" each LE lead     Knee/Hip Exercises: Seated   Sit to Sand 10 reps;without UE support     Knee/Hip Exercises: Supine   Bridges 10 reps   Straight Leg Raises Both;10 reps   Other Supine Knee/Hip Exercises clams 10 reps each                PT Education - 07/24/17 1545    Education provided Yes   Education Details reveiwed goals per evaluation and HEP.  Updated HEP   Person(s) Educated Patient   Methods Demonstration;Tactile cues;Verbal cues;Handout;Explanation   Comprehension Verbalized understanding;Returned demonstration;Verbal cues required;Tactile cues required          PT Short  Term Goals - 07/22/17 1001      PT SHORT TERM GOAL #1   Title Pt will be independent with HEP and perform consistently in order to maximize return to PLOF and decrease risk of reinjury.   Time 2   Period Weeks   Status New   Target Date 08/05/17     PT SHORT TERM GOAL #2   Title Pt will have improved MMT of all muscle groups tested to 5/5 in order to decrease pain and maximize functional tasks.   Time 2   Period Weeks   Status New           PT Long Term Goals - 07/22/17 1002      PT LONG TERM GOAL #1   Title Pt will be able to ascend/descend 12 stairs reciprocally with no pain, good eccentric control, and good mechanics throughout in order for pt to access her basement.   Time 4   Period Weeks   Status New   Target Date 08/19/17     PT LONG TERM GOAL #2   Title Pt will be able to perform 3MWT with no reports of pain and with min to no  gait deviations to decrease pain and maximzie community participation.   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   Time 4   Period Weeks   Status New               Plan - 07/24/17 1535    Clinical Impression Statement Reviewed HEP and goals per initial evaluation.  Pt given copy of evaluation.  Pt brought dog leash today and was able to demonstrate quad stretch correctly using leash.  Added new therex per therapist POC including SLS, bridge, cllams and sit to stand.  Also began tandem stance.  Pt with most instability with Rt LE and with Rt in rear of tandem.  Pt given bridge, sit to stand and SLS to complete with HEP.  PT also instructed to ice knee if pain goes up again or with noted swelling.  Pt verbalized understanding.  No change in symptoms at end of session.   Rehab Potential Good   PT Frequency 2x / week   PT Duration 4 weeks   PT Treatment/Interventions ADLs/Self Care Home Management;Cryotherapy;Electrical Stimulation;Moist Heat;Gait training;Stair training;Functional mobility training;Therapeutic activities;Therapeutic exercise;Balance training;Neuromuscular re-education;Patient/family education;Manual techniques;Passive range of motion;Dry needling;Energy conservation;Taping   PT Next Visit Plan Progress BLE strengthening and functional strengthening.  Next session add foam to balance, initiate step ups/downs, sidestepping, SLS with vectors.  Add  manual for soft tissue restrictions of R distal quad if needed.    PT Home Exercise Plan eval: prone quad stretch with rope  8/22: bridge, sit to stand, SLS   Consulted and Agree with Plan of Care Patient      Patient will benefit from skilled therapeutic intervention in order to improve the following deficits and impairments:  Abnormal gait, Decreased  strength, Difficulty walking, Increased fascial restricitons, Increased muscle spasms, Impaired flexibility, Improper body mechanics, Postural dysfunction, Pain  Visit Diagnosis: Chronic pain of right knee  Muscle weakness (generalized)  Difficulty in walking, not elsewhere classified     Problem List Patient Active Problem List   Diagnosis Date Noted  . Microcytosis  01/01/2017  . Essential hypertension 01/02/2016  . Change in stool caliber 12/31/2014  . 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 Anemia in chronic illness 12/29/2014  . History of rectal cancer 10/04/2011   Teena Irani, PTA/CLT [phone]  Teena Irani 07/24/2017, 3:46 PM  [facility] [address] [place], [place], [address] Phone: [phone]   Fax:  [phone]  Name: Tonya Vincent MRN: [id number] Date of Birth: 03-10-1951

## 2017-07-29 ENCOUNTER — Ambulatory Visit (HOSPITAL_COMMUNITY): Payer: PPO | Admitting: Physical Therapy

## 2017-07-29 DIAGNOSIS — M25561 Pain in right knee: Secondary | ICD-10-CM | POA: Diagnosis not present

## 2017-07-29 DIAGNOSIS — M6281 Muscle weakness (generalized): Secondary | ICD-10-CM

## 2017-07-29 DIAGNOSIS — G8929 Other chronic pain: Secondary | ICD-10-CM

## 2017-07-29 DIAGNOSIS — R262 Difficulty in walking, not elsewhere classified: Secondary | ICD-10-CM

## 2017-07-29 NOTE — Therapy (Signed)
[place], [place], [address] Phone: [phone]   Fax:  [phone]  Physical Therapy Treatment  Patient Details  Name: Tonya Vincent MRN: [id number] Date of Birth: [birth year] Referring Provider: [name], MD  Encounter Date: 07/29/2017      PT End of Session - 07/29/17 1538    Visit Number 3   Number of Visits 9   Date for PT Re-Evaluation 08/19/17   Authorization Type [employer]   Authorization Time Period 07/22/17 to 08/19/17   PT Start Time 1430   PT Stop Time 1515   PT Time Calculation (min) 45 min   Activity Tolerance Patient tolerated treatment well;No increased pain   Behavior During Therapy WFL for tasks assessed/performed      Past Medical History:  Diagnosis Date  . Anemia   . Anxiety   . Depression   . Diabetes mellitus   . Heart murmur   . History of rectal cancer 10/04/2011  . Hypertension   . Malignant neoplasm of rectum ([place]) 10/04/2011  . 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 rectal ca` dx'd 11/16/09   xrt comp 01/2010; chemo comp 01/2011  . Tubular adenoma of colon [id number]    Past Surgical History:  Procedure Laterality Date  . ABDOMINAL HYSTERECTOMY     for uterine fibroids  . COLON SURGERY N/A 04/24/2010   "took out half rectum"; rectal cancer  . HAND SURGERY     carpal tunnel- left  . [place]  . PORTACATH PLACEMENT     2011    There were no vitals filed for this visit.      Subjective Assessment - 07/29/17 1503    Subjective Pt states it is about the same as last visit.    Currently in Pain? Yes   Pain Score 2    Pain Location Knee   Pain Orientation Right   Pain Descriptors / Indicators Aching   Pain Type Acute pain                         [facility] Adult PT Treatment/Exercise - 07/29/17 0001      Knee/Hip Exercises: Stretches   Active Hamstring Stretch 3 reps;20 seconds   Active Hamstring Stretch Limitations with  rope     Knee/Hip Exercises: Standing   Lateral Step Up Both;10 reps;Step Height: 6";Hand Hold: 1   Lateral Step Up Limitations 6"   Forward Step Up Both;10 reps;Step Height: 6";Hand Hold: 1   Forward Step Up Limitations 6"   Stairs 2Rt 6" steps with 1 HR   SLS 30" each without UE on foam   SLS with Vectors 5X5" each no UE's   Other Standing Knee Exercises tandem stance 30" each LE lead on foam     Knee/Hip Exercises: Seated   Sit to Sand 10 reps;without UE support     Knee/Hip Exercises: Supine   Bridges 10 reps   Straight Leg Raises Both;10 reps   Other Supine Knee/Hip Exercises clams 10 reps each                  PT Short Term Goals - 07/22/17 1001      PT SHORT TERM GOAL #1   Title Pt will be independent with HEP and perform consistently in order to maximize return to PLOF and decrease risk of reinjury.   Time 2   Period Weeks   Status New   Target  Date 08/05/17     PT SHORT TERM GOAL #2   Title Pt will have improved MMT of all muscle groups tested to 5/5 in order to decrease pain and maximize functional tasks.   Time 2   Period Weeks   Status New           PT Long Term Goals - 07/22/17 1002      PT LONG TERM GOAL #1   Title Pt will be able to ascend/descend 12 stairs reciprocally with no pain, good eccentric control, and good mechanics throughout in order for pt to access her basement.   Time 4   Period Weeks   Status New   Target Date 08/19/17     PT LONG TERM GOAL #2   Title Pt will be able to perform 3MWT with no reports of pain and with min to no gait deviations to decrease pain and maximzie community participation.   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   Time 4   Period Weeks   Status New               Plan - 07/29/17 1544    Clinical  Impression Statement Pt with visably improved gait this session.  Added focused step activity and reciprocal negotiation of steps.  Pt able to complete these without difficulty.  Increased difficulty of static balance task by changing surface to foam.  Noted increased difficulty with this.  Postural cues needed throughout vector stance to maintain upright posturing.  Pt c/o pain at end of session .   Rehab Potential Good   PT Frequency 2x / week   PT Duration 4 weeks   PT Treatment/Interventions ADLs/Self Care Home Management;Cryotherapy;Electrical Stimulation;Moist Heat;Gait training;Stair training;Functional mobility training;Therapeutic activities;Therapeutic exercise;Balance training;Neuromuscular re-education;Patient/family education;Manual techniques;Passive range of motion;Dry needling;Energy conservation;Taping   PT Next Visit Plan Progress BLE strengthening and functional strengthening.  Next session begin focused forward step downs and lunges.    PT Home Exercise Plan eval: prone quad stretch with rope  8/22: bridge, sit to stand, SLS   Consulted and Agree with Plan of Care Patient      Patient will benefit from skilled therapeutic intervention in order to improve the following deficits and impairments:  Abnormal gait, Decreased strength, Difficulty walking, Increased fascial restricitons, Increased muscle spasms, Impaired flexibility, Improper body mechanics, Postural dysfunction, Pain  Visit Diagnosis: Chronic pain of right knee  Muscle weakness (generalized)  Difficulty in walking, not elsewhere classified     Problem List Patient Active Problem List   Diagnosis Date Noted  . Microcytosis 01/01/2017  . Essential hypertension 01/02/2016  . Change in stool caliber 12/31/2014  . 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 Anemia in chronic illness 12/29/2014  . History of rectal cancer 10/04/2011   Teena Irani, PTA/CLT [phone]  Teena Irani 07/29/2017, 3:53 PM  [facility] [address] [place], [place], [address] Phone: [phone]   Fax:  [phone]  Name: JAZLINE CUMBEE MRN: [id number] Date of Birth: [birth year]

## 2017-07-31 ENCOUNTER — Encounter (HOSPITAL_COMMUNITY): Payer: Self-pay

## 2017-07-31 ENCOUNTER — Ambulatory Visit (HOSPITAL_COMMUNITY): Payer: PPO

## 2017-07-31 DIAGNOSIS — R262 Difficulty in walking, not elsewhere classified: Secondary | ICD-10-CM

## 2017-07-31 DIAGNOSIS — M25561 Pain in right knee: Secondary | ICD-10-CM | POA: Diagnosis not present

## 2017-07-31 DIAGNOSIS — M6281 Muscle weakness (generalized): Secondary | ICD-10-CM

## 2017-07-31 DIAGNOSIS — G8929 Other chronic pain: Secondary | ICD-10-CM

## 2017-07-31 NOTE — Therapy (Signed)
[place], [place], [address] Phone: [phone]   Fax:  [phone]  Physical Therapy Treatment  Patient Details  Name: Tonya Vincent MRN: [id number] Date of Birth: 07-07-1951 Referring Provider: [name], MD  Encounter Date: 07/31/2017      PT End of Session - 07/31/17 0905    Visit Number 4   Number of Visits 9   Date for PT Re-Evaluation 08/19/17   Authorization Type [employer]   Authorization Time Period 07/22/17 to 08/19/17   PT Start Time 0901   PT Stop Time 0942   PT Time Calculation (min) 41 min   Activity Tolerance Patient tolerated treatment well;No increased pain   Behavior During Therapy WFL for tasks assessed/performed      Past Medical History:  Diagnosis Date  . Anemia   . Anxiety   . Depression   . Diabetes mellitus   . Heart murmur   . History of rectal cancer 10/04/2011  . Hypertension   . Malignant neoplasm of rectum ([place]) 10/04/2011  . 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 rectal ca` dx'd 11/16/09   xrt comp 01/2010; chemo comp 01/2011  . Tubular adenoma of colon [id number]    Past Surgical History:  Procedure Laterality Date  . ABDOMINAL HYSTERECTOMY     for uterine fibroids  . COLON SURGERY N/A 04/24/2010   "took out half rectum"; rectal cancer  . HAND SURGERY     carpal tunnel- left  . [place]  . PORTACATH PLACEMENT     2011    There were no vitals filed for this visit.      Subjective Assessment - 07/31/17 0905    Subjective Pt denies any knee pain this morning.   Currently in Pain? No/denies               [facility] Adult PT Treatment/Exercise - 07/31/17 0001      Knee/Hip Exercises: Stretches   Active Hamstring Stretch 2 reps;30 seconds   Active Hamstring Stretch Limitations supine with rope     Knee/Hip Exercises: Standing   Forward Lunges Both;10 reps   Step Down Both;10 reps;Hand Hold: 0;Step Height: 4"   Step Down  Limitations resisting valgus force with RTB   Wall Squat 10 reps   Wall Squat Limitations resisting valgus pull bil   Other Standing Knee Exercises bil SLS on foam and palov press with RTB x15 reps   Other Standing Knee Exercises heel taps on 4" step resisting valgus force from purple band x10 reps each; star drill on foam x5RT BLE     Knee/Hip Exercises: Supine   Bridges 15 reps   Bridges Limitations RTB around knees and feet on 4" step     Knee/Hip Exercises: Sidelying   Hip ABduction 2 sets;10 reps;Both     Knee/Hip Exercises: Prone   Prone Knee Hang Limitations quadruped firehydrants 2x10 reps with RTB             PT Education - 07/31/17 0943    Education provided Yes   Education Details exercise technique, HEP   Person(s) Educated Patient   Methods Explanation;Demonstration;Handout   Comprehension Verbalized understanding;Returned demonstration          PT Short Term Goals - 07/22/17 1001      PT SHORT TERM GOAL #1   Title Pt will be independent with HEP and perform consistently in order to maximize return to PLOF and decrease risk of reinjury.  Time 2   Period Weeks   Status New   Target Date 08/05/17     PT SHORT TERM GOAL #2   Title Pt will have improved MMT of all muscle groups tested to 5/5 in order to decrease pain and maximize functional tasks.   Time 2   Period Weeks   Status New           PT Long Term Goals - 07/22/17 1002      PT LONG TERM GOAL #1   Title Pt will be able to ascend/descend 12 stairs reciprocally with no pain, good eccentric control, and good mechanics throughout in order for pt to access her basement.   Time 4   Period Weeks   Status New   Target Date 08/19/17     PT LONG TERM GOAL #2   Title Pt will be able to perform 3MWT with no reports of pain and with min to no gait deviations to decrease pain and maximzie community participation.   Time 4   Period Weeks   Status New     PT LONG TERM GOAL #3   Title Pt will  report being able to return to Zumba classes 2-3x/week and report no R knee pain during or after to demo improved strength and overall function.   Time 4   Period Weeks   Status New               Plan - 07/31/17 0944    Clinical Impression Statement Progressed pt's therex this date which pt tolerated well, without c/o pain. She continues to demo increased knee valgus throughout functional tasks indicating poor functional hip strength. Pt demo'd fatigue but no pain at EOS. Continue POC as planned.   Rehab Potential Good   PT Frequency 2x / week   PT Duration 4 weeks   PT Treatment/Interventions ADLs/Self Care Home Management;Cryotherapy;Electrical Stimulation;Moist Heat;Gait training;Stair training;Functional mobility training;Therapeutic activities;Therapeutic exercise;Balance training;Neuromuscular re-education;Patient/family education;Manual techniques;Passive range of motion;Dry needling;Energy conservation;Taping   PT Next Visit Plan Progress BLE strengthening and functional strengthening emphasizing proper form and decreasing knee valgus BLE; add side planks and single leg mini squats resisting valgus force next session   PT Home Exercise Plan eval: prone quad stretch with rope  8/22: bridge, sit to stand, SLS; 8/29: firehydrants with RTB   Consulted and Agree with Plan of Care Patient      Patient will benefit from skilled therapeutic intervention in order to improve the following deficits and impairments:  Abnormal gait, Decreased strength, Difficulty walking, Increased fascial restricitons, Increased muscle spasms, Impaired flexibility, Improper body mechanics, Postural dysfunction, Pain  Visit Diagnosis: Chronic pain of right knee  Muscle weakness (generalized)  Difficulty in walking, not elsewhere classified     Problem List Patient Active Problem List   Diagnosis Date Noted  . Microcytosis 01/01/2017  . Essential hypertension 01/02/2016  . Change in stool caliber  12/31/2014  . 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 Anemia in chronic illness 12/29/2014  . History of rectal cancer 10/04/2011      Geraldine Solar PT, DPT  [facility] [address] [place], [place], [address] Phone: [phone]   Fax:  [phone]  Name: Tonya Vincent MRN: [id number] Date of Birth: Feb 14, 1951

## 2017-07-31 NOTE — Patient Instructions (Signed)
  BRIDGING  While lying on your back, tighten your lower abdominals, squeeze your buttocks and then raise your buttocks off the floor/bed as creating a "Bridge" with your body. Hold and then lower yourself and repeat.  Perform 1x/day, 2-3 sets of 10-15 reps each   SIT TO STAND - NO SUPPORT  Start by scooting close to the front of the chair.  Next, lean forward at your trunk and reach forward with your arms and rise to standing without using your hands to push off from the chair or other object.   Use your arms as a counter-balance by reaching forward when in sitting and lower them as you approach standing.   Perform 1x/day, 2-3 sets of 10-15 reps each   SINGLE LEG STANCE - SLS  Stand on one leg and maintain your balance.  Perform on each leg -- can stand on a pillow to increase difficulty. 10-15x each leg holding for as long as you can.   FIrehydrant with Theraband   Begin in quadruped with block underneath one knee. Lift opposite knee off ground while externally rotating/abducting hip.  Perform 1x/day, 2-3 sets of 10-15 reps each with band.

## 2017-08-07 ENCOUNTER — Ambulatory Visit (HOSPITAL_COMMUNITY): Payer: PPO | Attending: Family Medicine

## 2017-08-07 DIAGNOSIS — G8929 Other chronic pain: Secondary | ICD-10-CM | POA: Diagnosis not present

## 2017-08-07 DIAGNOSIS — M6281 Muscle weakness (generalized): Secondary | ICD-10-CM | POA: Diagnosis not present

## 2017-08-07 DIAGNOSIS — R262 Difficulty in walking, not elsewhere classified: Secondary | ICD-10-CM | POA: Insufficient documentation

## 2017-08-07 DIAGNOSIS — M25561 Pain in right knee: Secondary | ICD-10-CM | POA: Insufficient documentation

## 2017-08-07 NOTE — Patient Instructions (Signed)
FUNCTIONAL MOBILITY: Squat    Stance: shoulder-width on floor. Bend hips and knees. Keep back straight. Do not allow knees to bend past toes. Send Butt back!  Squeeze glutes and quads to stand. 10-15 reps per set, 2 sets per day.    Copyright  [employer]. All rights reserved.

## 2017-08-07 NOTE — Therapy (Signed)
[place], [place], [address] Phone: [phone]   Fax:  [phone]  Physical Therapy Treatment  Patient Details  Name: Tonya Vincent MRN: [id number] Date of Birth: [birth year] Referring Provider: [name], MD  Encounter Date: 08/07/2017      PT End of Session - 08/07/17 0915    Visit Number 5   Number of Visits 9   Date for PT Re-Evaluation 08/19/17   Authorization Type [employer]   Authorization Time Period 07/22/17 to 08/19/17   PT Start Time 0906   PT Stop Time 0954   PT Time Calculation (min) 48 min      Past Medical History:  Diagnosis Date  . Anemia   . Anxiety   . Depression   . Diabetes mellitus   . Heart murmur   . History of rectal cancer 10/04/2011  . Hypertension   . Malignant neoplasm of rectum ([place]) 10/04/2011  . 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 rectal ca` dx'd 11/16/09   xrt comp 01/2010; chemo comp 01/2011  . Tubular adenoma of colon [id number]    Past Surgical History:  Procedure Laterality Date  . ABDOMINAL HYSTERECTOMY     for uterine fibroids  . COLON SURGERY N/A 04/24/2010   "took out half rectum"; rectal cancer  . HAND SURGERY     carpal tunnel- left  . [place]  . PORTACATH PLACEMENT     2011    There were no vitals filed for this visit.      Subjective Assessment - 08/07/17 0910    Subjective Pt stated her knee buckled close to 10 times yesterday, no reports of falls but were close calls.  Reports she has not been compliant with HEP due to being busy.  Current pain scale 3/10 Rt knee intermittent sharp pain.  Continues to have difficulty getting in to vehicle.   Patient Stated Goals make her knee stop hurting   Currently in Pain? Yes   Pain Score 3    Pain Location Knee   Pain Orientation Right   Pain Descriptors / Indicators Sharp;Tender  intermittent sharp pain   Pain Type Acute pain   Pain Onset 1 to 4 weeks ago   Pain Frequency  Intermittent   Aggravating Factors  weight bearing   Pain Relieving Factors vick's vapor rub and red oil   Effect of Pain on Daily Activities increases                         [facility] Adult PT Treatment/Exercise - 08/07/17 0001      Knee/Hip Exercises: Standing   Forward Lunges Both;15 reps   Forward Step Up Right;15 reps;Hand Hold: 0;Step Height: 6"   Forward Step Up Limitations 6in step height cueing to reduce valgus   Step Down Both;15 reps;Hand Hold: 0;Step Height: 4"   Step Down Limitations resisting valgus force with RTB   Functional Squat 3 sets;10 reps   Functional Squat Limitations 1) BLE; 20 Lt foot forward then 3) Lt foot on 8in step    Other Standing Knee Exercises bil SLS on foam and palov press with RTB x15 reps     Knee/Hip Exercises: Supine   Bridges 2 sets;15 reps   Bridges Limitations RTB around knees and feet on 4" step     Knee/Hip Exercises: Sidelying   Hip ABduction Both;15 reps   Hip ABduction Limitations back against wall   Other Sidelying  Knee/Hip Exercises side planks 3x10'     Knee/Hip Exercises: Prone   Prone Knee Hang Limitations quadruped firehydrants  15reps with RTB; hip extension with 3# dowel to reduce lumbar activation                  PT Short Term Goals - 07/22/17 1001      PT SHORT TERM GOAL #1   Title Pt will be independent with HEP and perform consistently in order to maximize return to PLOF and decrease risk of reinjury.   Time 2   Period Weeks   Status New   Target Date 08/05/17     PT SHORT TERM GOAL #2   Title Pt will have improved MMT of all muscle groups tested to 5/5 in order to decrease pain and maximize functional tasks.   Time 2   Period Weeks   Status New           PT Long Term Goals - 07/22/17 1002      PT LONG TERM GOAL #1   Title Pt will be able to ascend/descend 12 stairs reciprocally with no pain, good eccentric control, and good mechanics throughout in order for pt to access her  basement.   Time 4   Period Weeks   Status New   Target Date 08/19/17     PT LONG TERM GOAL #2   Title Pt will be able to perform 3MWT with no reports of pain and with min to no gait deviations to decrease pain and maximzie community participation.   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   Time 4   Period Weeks   Status New               Plan - 08/07/17 1006    Clinical Impression Statement Session focus on proximal then functional strengthening.  Additional exercises to address core weakness and gluteal strengthening to improve functional strengthening with ease.  Cueing required to decrease knee valgus wiht functional tasks.  Pt educated on importance of compliance with HEP for maximal benefits, verbally understood.  Reports of pain reduced at EOS with improve gait mechanics noted.     Rehab Potential Good   PT Frequency 2x / week   PT Duration 4 weeks   PT Treatment/Interventions ADLs/Self Care Home Management;Cryotherapy;Electrical Stimulation;Moist Heat;Gait training;Stair training;Functional mobility training;Therapeutic activities;Therapeutic exercise;Balance training;Neuromuscular re-education;Patient/family education;Manual techniques;Passive range of motion;Dry needling;Energy conservation;Taping   PT Next Visit Plan Progress BLE strengthening and functional strengthening emphasizing proper form and decreasing knee valgus BLE; progress to single leg mini squats resisting valgus force next session once able to demonstrate appropriate weight bearing   PT Home Exercise Plan eval: prone quad stretch with rope  8/22: bridge, sit to stand, SLS; 8/29: firehydrants with RTB; 9/5: squats      Patient will benefit from skilled therapeutic intervention in order to improve the following deficits and  impairments:  Abnormal gait, Decreased strength, Difficulty walking, Increased fascial restricitons, Increased muscle spasms, Impaired flexibility, Improper body mechanics, Postural dysfunction, Pain  Visit Diagnosis: Chronic pain of right knee  Muscle weakness (generalized)  Difficulty in walking, not elsewhere classified     Problem List Patient Active Problem List   Diagnosis Date Noted  . Microcytosis  01/01/2017  . Essential hypertension 01/02/2016  . Change in stool caliber 12/31/2014  . 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 Anemia in chronic illness 12/29/2014  . History of rectal cancer 10/04/2011   Ihor Austin, [place]; [place]  Aldona Lento 08/07/2017, 10:11 AM  [facility] [address] [place], [place], [address] Phone: [phone]   Fax:  [phone]  Name: Tonya Vincent MRN: [id number] Date of Birth: 1951/04/12

## 2017-08-12 ENCOUNTER — Telehealth (HOSPITAL_COMMUNITY): Payer: Self-pay | Admitting: Family Medicine

## 2017-08-12 ENCOUNTER — Ambulatory Visit (HOSPITAL_COMMUNITY): Payer: PPO

## 2017-08-12 NOTE — Telephone Encounter (Signed)
08/12/17  left a message that she is experiencing diarrhea and can't come

## 2017-08-13 ENCOUNTER — Telehealth (HOSPITAL_COMMUNITY): Payer: Self-pay

## 2017-08-13 NOTE — Telephone Encounter (Signed)
She lifted something and it made her not feel good she will not be here tomorrow.

## 2017-08-14 ENCOUNTER — Ambulatory Visit (HOSPITAL_COMMUNITY): Payer: PPO

## 2017-08-19 ENCOUNTER — Ambulatory Visit (HOSPITAL_COMMUNITY): Payer: PPO | Admitting: Physical Therapy

## 2017-08-19 DIAGNOSIS — M6281 Muscle weakness (generalized): Secondary | ICD-10-CM | POA: Diagnosis not present

## 2017-08-19 DIAGNOSIS — R262 Difficulty in walking, not elsewhere classified: Secondary | ICD-10-CM | POA: Diagnosis not present

## 2017-08-19 DIAGNOSIS — G8929 Other chronic pain: Secondary | ICD-10-CM | POA: Diagnosis not present

## 2017-08-19 DIAGNOSIS — M25561 Pain in right knee: Principal | ICD-10-CM

## 2017-08-19 NOTE — Therapy (Signed)
[facility] [place], [place], [address] Phone: [phone]   Fax:  [phone]  Physical Therapy Treatment  Patient Details  Name: Tonya Vincent MRN: [id number] Date of Birth: 09-23-1951 Referring Provider: [name], MD  Encounter Date: 08/19/2017      PT End of Session - 08/19/17 1030    Visit Number 6   Number of Visits 9   Date for PT Re-Evaluation 08/19/17   Authorization Type [employer]   Authorization Time Period 07/22/17 to 08/19/17   PT Start Time 0908   PT Stop Time 0946   PT Time Calculation (min) 38 min      Past Medical History:  Diagnosis Date  . Anemia   . Anxiety   . Depression   . Diabetes mellitus   . Heart murmur   . History of rectal cancer 10/04/2011  . Hypertension   . Malignant neoplasm of rectum ([place]) 10/04/2011  . 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 rectal ca` dx'd 11/16/09   xrt comp 01/2010; chemo comp 01/2011  . Tubular adenoma of colon [id number]    Past Surgical History:  Procedure Laterality Date  . ABDOMINAL HYSTERECTOMY     for uterine fibroids  . COLON SURGERY N/A 04/24/2010   "took out half rectum"; rectal cancer  . HAND SURGERY     carpal tunnel- left  . [place]  . PORTACATH PLACEMENT     2011    There were no vitals filed for this visit.      Subjective Assessment - 08/19/17 0917    Subjective Pt has had some issues and has not been able to attend therapy.  States she has not been doing her HEP.  Currently reports pain in both hips when she's trying to sleep and pain in her knees of 3/10 with weight bearing.   Currently in Pain? Yes   Pain Score 3    Pain Location Knee   Pain Orientation Right   Pain Descriptors / Indicators Sharp;Tender            [facility] PT Assessment - 08/19/17 0001      Assessment   Medical Diagnosis Patellar tendinitis     Observation/Other Assessments   Focus on Therapeutic Outcomes (FOTO)  53% limitation  (was 1%)     AROM   Right Knee Extension 0   Right Knee Flexion 125     Strength   Right Hip Flexion 5/5   Right Hip Extension 4+/5   Right Hip ABduction 4+/5   Left Hip Extension 4/5   Left Hip ABduction 4+/5   Right Knee Flexion 5/5   Right Knee Extension 5/5   Left Knee Flexion 5/5   Left Knee Extension 5/5   Right Ankle Dorsiflexion 5/5   Left Ankle Dorsiflexion 5/5     Ambulation/Gait   Ambulation/Gait Yes   Ambulation Distance (Feet) 916 Feet   Gait Pattern Step-through pattern;Decreased stance time - right;Decreased step length - left;Antalgic;Trendelenburg;Wide base of support     Static Standing Balance   Static Standing - Balance Support No upper extremity supported   Static Standing Balance -  Activities  Single Leg Stance - Right Leg;Single Leg Stance - Left Leg   Static Standing - Comment/# of Minutes Rt and Lt 30" holds  PT Short Term Goals - 08/19/17 0936      PT SHORT TERM GOAL #1   Title Pt will be independent with HEP and perform consistently in order to maximize return to PLOF and decrease risk of reinjury.   Time 2   Period Weeks   Status Partially Met  pt knows how and what to do just not compliant     PT SHORT TERM GOAL #2   Title Pt will have improved MMT of all muscle groups tested to 5/5 in order to decrease pain and maximize functional tasks.   Time 2   Period Weeks   Status On-going           PT Long Term Goals - 08/19/17 0936      PT LONG TERM GOAL #1   Title Pt will be able to ascend/descend 12 stairs reciprocally with no pain, good eccentric control, and good mechanics throughout in order for pt to access her basement.   Time 4   Period Weeks   Status Partially Met  physically can complete but with pain     PT LONG TERM GOAL #2   Title Pt will be able to perform 3MWT with no reports of pain and with min to no gait deviations to decrease pain and maximzie community  participation.   Time 4   Period Weeks   Status On-going  pain and gait deviation     PT LONG TERM GOAL #3   Title Pt will report being able to return to Zumba classes 2-3x/week and report no R knee pain during or after to demo improved strength and overall function.   Time 4   Period Weeks   Status Not Met               Plan - 08/19/17 1030    Clinical Impression Statement Pt has completed 4 weeks of therapy with 2 missed appt last week due to medical issues.  Pt admits to not being complaint with HEP, and is wanting to either return to [place] for aquatic therapy consult or begin participation in aquatic classes at the [facility]. Pt is concerned with high co-pays and feels she is not getting any better coming here.  Measurements re-tested this session with no significant change from initial evaluation.  All goals are either partially or unmet at this point with pain remaining constant.  With no change and patient request, she will be discharged at this time.   Rehab Potential Good   PT Frequency 2x / week   PT Duration 4 weeks   PT Treatment/Interventions ADLs/Self Care Home Management;Cryotherapy;Electrical Stimulation;Moist Heat;Gait training;Stair training;Functional mobility training;Therapeutic activities;Therapeutic exercise;Balance training;Neuromuscular re-education;Patient/family education;Manual techniques;Passive range of motion;Dry needling;Energy conservation;Taping   PT Next Visit Plan Discharge per pateint request and lack of progress.    PT Home Exercise Plan eval: prone quad stretch with rope  8/22: bridge, sit to stand, SLS; 8/29: firehydrants with RTB; 9/5: squats      Patient will benefit from skilled therapeutic intervention in order to improve the following deficits and impairments:  Abnormal gait, Decreased strength, Difficulty walking, Increased fascial restricitons, Increased muscle spasms, Impaired flexibility, Improper body mechanics, Postural dysfunction,  Pain  Visit Diagnosis: Chronic pain of right knee  Muscle weakness (generalized)  Difficulty in walking, not elsewhere classified     Problem List Patient Active Problem List   Diagnosis Date Noted  . Microcytosis 01/01/2017  . Essential hypertension 01/02/2016  . Change in stool caliber 12/31/2014  .  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 Anemia in chronic illness 12/29/2014  . History of rectal cancer 10/04/2011   Tonya Vincent, PTA/CLT [phone]  Tonya Vincent 08/19/2017, 10:37 AM  [facility] [address] [place], [place], [address] Phone: [phone]   Fax:  [phone]  Name: Tonya Vincent MRN: [id number] Date of Birth: 1951-03-29

## 2017-08-21 ENCOUNTER — Ambulatory Visit (HOSPITAL_COMMUNITY): Payer: PPO | Admitting: Physical Therapy

## 2017-08-21 ENCOUNTER — Encounter (HOSPITAL_COMMUNITY): Payer: Self-pay

## 2017-08-21 NOTE — Therapy (Signed)
[place], [place], [address] Phone: [phone]   Fax:  [phone]  Patient Details  Name: Tonya Vincent MRN: [id number] Date of Birth: January 28, 1951 Referring Provider:  No ref. provider found  Encounter Date: 08/21/2017   PHYSICAL THERAPY DISCHARGE SUMMARY  Visits from Start of Care: 6  Current functional level related to goals / functional outcomes: Pt called and wished to be discharged due to lack of progress and she wants to try aquatic therapy.    Remaining deficits: See last treatment notw   Education / Equipment: n/a Plan: Patient agrees to discharge.  Patient goals were partially met. Patient is being discharged due to meeting the stated rehab goals.  ?????        Geraldine Solar PT, [place] [address] [place], [place], [address] Phone: [phone]   Fax:  [phone]

## 2017-09-05 DIAGNOSIS — Z23 Encounter for immunization: Secondary | ICD-10-CM | POA: Diagnosis not present

## 2017-09-05 DIAGNOSIS — R42 Dizziness and giddiness: Secondary | ICD-10-CM | POA: Diagnosis not present

## 2017-09-05 DIAGNOSIS — N3281 Overactive bladder: Secondary | ICD-10-CM | POA: Diagnosis not present

## 2017-09-06 DIAGNOSIS — L309 Dermatitis, unspecified: Secondary | ICD-10-CM | POA: Diagnosis not present

## 2017-09-30 DIAGNOSIS — E089 Diabetes mellitus due to underlying condition without complications: Secondary | ICD-10-CM | POA: Diagnosis not present

## 2017-09-30 DIAGNOSIS — I10 Essential (primary) hypertension: Secondary | ICD-10-CM | POA: Diagnosis not present

## 2017-09-30 DIAGNOSIS — R42 Dizziness and giddiness: Secondary | ICD-10-CM | POA: Diagnosis not present

## 2017-09-30 DIAGNOSIS — F330 Major depressive disorder, recurrent, mild: Secondary | ICD-10-CM | POA: Diagnosis not present

## 2017-09-30 DIAGNOSIS — F423 Hoarding disorder: Secondary | ICD-10-CM | POA: Diagnosis not present

## 2017-10-12 LAB — BASIC METABOLIC PANEL: GLUCOSE: 92

## 2017-10-12 LAB — HEMOGLOBIN A1C: Hemoglobin A1C: 7

## 2017-11-22 DIAGNOSIS — Z1231 Encounter for screening mammogram for malignant neoplasm of breast: Secondary | ICD-10-CM | POA: Diagnosis not present

## 2017-12-30 DIAGNOSIS — I10 Essential (primary) hypertension: Secondary | ICD-10-CM | POA: Diagnosis not present

## 2017-12-30 DIAGNOSIS — N3281 Overactive bladder: Secondary | ICD-10-CM | POA: Diagnosis not present

## 2017-12-30 DIAGNOSIS — S40022A Contusion of left upper arm, initial encounter: Secondary | ICD-10-CM | POA: Diagnosis not present

## 2017-12-30 DIAGNOSIS — F331 Major depressive disorder, recurrent, moderate: Secondary | ICD-10-CM | POA: Diagnosis not present

## 2017-12-30 DIAGNOSIS — E089 Diabetes mellitus due to underlying condition without complications: Secondary | ICD-10-CM | POA: Diagnosis not present

## 2017-12-30 DIAGNOSIS — R635 Abnormal weight gain: Secondary | ICD-10-CM | POA: Diagnosis not present

## 2018-02-06 ENCOUNTER — Telehealth: Payer: Self-pay

## 2018-02-06 ENCOUNTER — Inpatient Hospital Stay: Payer: PPO | Attending: Hematology and Oncology

## 2018-02-06 ENCOUNTER — Encounter: Payer: Self-pay | Admitting: Hematology and Oncology

## 2018-02-06 ENCOUNTER — Inpatient Hospital Stay (HOSPITAL_BASED_OUTPATIENT_CLINIC_OR_DEPARTMENT_OTHER): Payer: PPO | Admitting: Hematology and Oncology

## 2018-02-06 ENCOUNTER — Telehealth: Payer: Self-pay | Admitting: Hematology and Oncology

## 2018-02-06 DIAGNOSIS — Z7982 Long term (current) use of aspirin: Secondary | ICD-10-CM | POA: Insufficient documentation

## 2018-02-06 DIAGNOSIS — Z9221 Personal history of antineoplastic chemotherapy: Secondary | ICD-10-CM | POA: Insufficient documentation

## 2018-02-06 DIAGNOSIS — Z79899 Other long term (current) drug therapy: Secondary | ICD-10-CM

## 2018-02-06 DIAGNOSIS — Z85048 Personal history of other malignant neoplasm of rectum, rectosigmoid junction, and anus: Secondary | ICD-10-CM

## 2018-02-06 DIAGNOSIS — Z923 Personal history of irradiation: Secondary | ICD-10-CM | POA: Insufficient documentation

## 2018-02-06 DIAGNOSIS — K625 Hemorrhage of anus and rectum: Secondary | ICD-10-CM

## 2018-02-06 DIAGNOSIS — I10 Essential (primary) hypertension: Secondary | ICD-10-CM | POA: Diagnosis not present

## 2018-02-06 LAB — CBC WITH DIFFERENTIAL/PLATELET
Basophils Absolute: 0 10*3/uL (ref 0.0–0.1)
Basophils Relative: 1 %
EOS ABS: 0.2 10*3/uL (ref 0.0–0.5)
EOS PCT: 3 %
HCT: 43.3 % (ref 34.8–46.6)
Hemoglobin: 13.7 g/dL (ref 11.6–15.9)
LYMPHS ABS: 1.5 10*3/uL (ref 0.9–3.3)
Lymphocytes Relative: 33 %
MCH: 26.6 pg (ref 25.1–34.0)
MCHC: 31.6 g/dL (ref 31.5–36.0)
MCV: 83.9 fL (ref 79.5–101.0)
MONOS PCT: 11 %
Monocytes Absolute: 0.5 10*3/uL (ref 0.1–0.9)
Neutro Abs: 2.4 10*3/uL (ref 1.5–6.5)
Neutrophils Relative %: 52 %
PLATELETS: 304 10*3/uL (ref 145–400)
RBC: 5.16 MIL/uL (ref 3.70–5.45)
RDW: 16.8 % — ABNORMAL HIGH (ref 11.2–14.5)
WBC: 4.6 10*3/uL (ref 3.9–10.3)

## 2018-02-06 LAB — COMPREHENSIVE METABOLIC PANEL
ALK PHOS: 100 U/L (ref 40–150)
ALT: 19 U/L (ref 0–55)
AST: 15 U/L (ref 5–34)
Albumin: 4.1 g/dL (ref 3.5–5.0)
Anion gap: 9 (ref 3–11)
BILIRUBIN TOTAL: 0.4 mg/dL (ref 0.2–1.2)
BUN: 12 mg/dL (ref 7–26)
CALCIUM: 10 mg/dL (ref 8.4–10.4)
CHLORIDE: 106 mmol/L (ref 98–109)
CO2: 24 mmol/L (ref 22–29)
CREATININE: 1.26 mg/dL — AB (ref 0.60–1.10)
GFR calc Af Amer: 51 mL/min — ABNORMAL LOW (ref >60)
GFR calc non Af Amer: 44 mL/min — ABNORMAL LOW (ref >60)
Glucose, Bld: 173 mg/dL — ABNORMAL HIGH (ref 70–140)
Potassium: 4.5 mmol/L (ref 3.5–5.1)
Sodium: 139 mmol/L (ref 136–145)
Total Protein: 8 g/dL (ref 6.4–8.3)

## 2018-02-06 LAB — CEA (IN HOUSE-CHCC): CEA (CHCC-IN HOUSE): 1.05 ng/mL (ref 0.00–5.00)

## 2018-02-06 NOTE — Telephone Encounter (Signed)
Called with below message. Verbalized understanding. 

## 2018-02-06 NOTE — Telephone Encounter (Signed)
Gave patient AVs and calendar of upcoming march 2020 appointments.  °

## 2018-02-06 NOTE — Assessment & Plan Note (Signed)
she will continue current medical management. I recommend close follow-up with primary care doctor for medication adjustment.  

## 2018-02-06 NOTE — Assessment & Plan Note (Signed)
She has chronic intermittent rectal bleeding likely due to hemorrhoidal bleeding She is also on aspirin therapy We discussed potential discontinuation of aspirin I recommend she consider calling her GI doctor for further evaluation

## 2018-02-06 NOTE — Progress Notes (Signed)
[facility] OFFICE PROGRESS NOTE  Patient Care Team: [name], MD as PCP - General (Family Medicine) [name], MD as Consulting Physician (Hematology and Oncology)  ASSESSMENT & PLAN:  History of rectal cancer Clinically, she has no signs of cancer recurrence.  She is a long-term cancer survivor I reviewed the current NCCN guidelines with the patient. Her last colonoscopy was in 2017. Next due 2020 due to presence of polyps Due to recent intermittent rectal bleeding, I recommend consideration to get  repeat colonoscopy sooner Per guidelines, there is no benefit for further surveillance imaging in the future. We discussed transitioning her care to cancer survivorship with the patient wants to continue follow-up with me.  Essential hypertension she will continue current medical management. I recommend close follow-up with primary care doctor for medication adjustment.  Bright red rectal bleeding She has chronic intermittent rectal bleeding likely due to hemorrhoidal bleeding She is also on aspirin therapy We discussed potential discontinuation of aspirin I recommend she consider calling her GI doctor for further evaluation   Orders Placed This Encounter  Procedures  . [facility] (IN HOUSE-[facility])    INTERVAL HISTORY: Please see below for problem oriented charting. She returns for further follow-up She complained of intermittent rectal bleeding Denies other form of bleeding such as hematuria or epistaxis She denies nausea, abdominal bloating or recent changes in bowel habits  Denies recent weight loss.  No recent rectal pain. She denies recent infection  SUMMARY OF ONCOLOGIC HISTORY: Oncology History   Malignant neoplasm of rectum, cT3N0M0 down staged to ypT2N0M0 after neoadjuvant chemotherapy and radiation therapy   Primary site: Colon and Rectum   Staging method: AJCC 7th Edition   Clinical: Stage I (T2, N0, M0) signed by [name], MD on 12/28/2013  1:49  PM   Pathologic: Stage I (T2, N0, cM0) signed by [name], MD on 12/28/2013  1:49 PM   Summary: Stage I (T2, N0, cM0)       History of rectal cancer   11/17/2009 Procedure    Colonoscopy and rectal biopsy confirmed moderately differentiated adenocarcinoma      11/18/2009 Imaging    Transrectal ultrasound place staging T3 lesion      11/18/2009 Imaging    Staging CT scan of the chest, abdomen and pelvis showed multiple lesions in the liver as well as paraspinal mass of unknown etiology      12/05/2009 - 04/03/2010 Chemotherapy    The patient completed new adjuvant chemotherapy with 5-FU and radiation therapy      12/14/2009 Imaging    PET CT scan show faint metabolic activity in the paraspinal mass with no activity in the liver      04/24/2010 Surgery    She had surgical resection with negative margins. Final pathology was T2, N0, M0 (down staged by chemoradiation therapy from T3, N0, M0)      11/19/2012 Imaging    MRI of the liver confirmed benign hemangioma      01/08/2014 Procedure    Colonoscopy and biopsy was negative      12/21/2014 Imaging    CT scan showed no recurrence of colon cancer. Incidentally, paraspinal mass is slightly larger.      10/04/2016 Procedure    She had repeat colonoscopy which showed two 3 mm polyps in the transverse colon, removed with a cold snare. Resected and retrieved. The examination was otherwise normal on direct and retroflexion views.      10/04/2016 Pathology Results    Surgical [  P], transverse, polyp(s) - SERRATED POLYP WITH FOCAL FEATURES SUGGESTIVE OF SESSILE SERRATED POLYP/ADENOMA, ONE FRAGMENT. - HYPERPLASTIC POLYP WITHOUT DYSPLASIA, ONE FRAGMENT. - BENIGN COLORECTAL MUCOSA WITHOUT DYSPLASIA, ONE FRAGMENT. - SEE COMMENT. Microscopic Comment After routine specimen processing, three fragments are identified. One of the fragments demonstrates a serrated horizontally situated crypt, which is suggestive of superficial sampling of a  sessile serrated polyp/adenoma (the differential includes another fragment of hyperplastic polyp). Although this is the case, the finding is very focal and not definitive.        REVIEW OF SYSTEMS:   Constitutional: Denies fevers, chills or abnormal weight loss Eyes: Denies blurriness of vision Ears, nose, mouth, throat, and face: Denies mucositis or sore throat Respiratory: Denies cough, dyspnea or wheezes Cardiovascular: Denies palpitation, chest discomfort or lower extremity swelling Gastrointestinal:  Denies nausea, heartburn or change in bowel habits Skin: Denies abnormal skin rashes Lymphatics: Denies new lymphadenopathy or easy bruising Neurological:Denies numbness, tingling or new weaknesses Behavioral/Psych: Mood is stable, no new changes  All other systems were reviewed with the patient and are negative.  I have reviewed the past medical history, past surgical history, social history and family history with the patient and they are unchanged from previous note.  ALLERGIES:  is allergic to antibacterial hand soap [triclosan] and norgesic [orphenadrine-aspirin-caffeine].  MEDICATIONS:  Current Outpatient Medications  Medication Sig Dispense Refill  . vortioxetine HBr (TRINTELLIX) 10 MG TABS tablet Take 10 mg by mouth daily.    Marland Kitchen aspirin EC 81 MG tablet Take 81 mg by mouth daily.    Marland Kitchen docusate sodium (COLACE) 250 MG capsule Take 250 mg by mouth daily. Takes 100 mg to 300 mg    . empagliflozin (JARDIANCE) 25 MG TABS tablet Take 25 mg by mouth daily.    . methylphenidate 54 MG PO CR tablet Take 54 mg by mouth every morning.    . olmesartan (BENICAR) 40 MG tablet Take 40 mg by mouth daily.    Marland Kitchen spironolactone (ALDACTONE) 25 MG tablet Take 25 mg by mouth daily.    . TRULICITY 1.5 HY/8.6VH SOPN Inject 1.5 mg into the skin once a week.     No current facility-administered medications for this visit.     PHYSICAL EXAMINATION: ECOG PERFORMANCE STATUS: 0 - Asymptomatic  Vitals:    02/06/18 1009  BP: (!) 130/99  Pulse: 74  Resp: 18  Temp: 98.3 F (36.8 C)  SpO2: 98%   Filed Weights   02/06/18 1009  Weight: 201 lb 1.6 oz (91.2 kg)    GENERAL:alert, no distress and comfortable SKIN: skin color, texture, turgor are normal, no rashes or significant lesions EYES: normal, Conjunctiva are pink and non-injected, sclera clear OROPHARYNX:no exudate, no erythema and lips, buccal mucosa, and tongue normal  NECK: supple, thyroid normal size, non-tender, without nodularity LYMPH:  no palpable lymphadenopathy in the cervical, axillary or inguinal LUNGS: clear to auscultation and percussion with normal breathing effort HEART: regular rate & rhythm and no murmurs and no lower extremity edema ABDOMEN:abdomen soft, non-tender and normal bowel sounds Musculoskeletal:no cyanosis of digits and no clubbing  NEURO: alert & oriented x 3 with fluent speech, no focal motor/sensory deficits  LABORATORY DATA:  I have reviewed the data as listed    Component Value Date/Time   NA 139 02/06/2018 0946   NA 141 01/01/2017 0841   K 4.5 02/06/2018 0946   K 3.7 01/01/2017 0841   CL 106 02/06/2018 0946   CL 99 11/05/2012 0842   CO2 24  02/06/2018 0946   CO2 25 01/01/2017 0841   GLUCOSE 173 (H) 02/06/2018 0946   GLUCOSE 106 01/01/2017 0841   GLUCOSE 167 (H) 11/05/2012 0842   BUN 12 02/06/2018 0946   BUN 18.2 01/01/2017 0841   CREATININE 1.26 (H) 02/06/2018 0946   CREATININE 1.3 (H) 01/01/2017 0841   CALCIUM 10.0 02/06/2018 0946   CALCIUM 9.5 01/01/2017 0841   PROT 8.0 02/06/2018 0946   PROT 7.5 01/01/2017 0841   ALBUMIN 4.1 02/06/2018 0946   ALBUMIN 4.0 01/01/2017 0841   AST 15 02/06/2018 0946   AST 13 01/01/2017 0841   ALT 19 02/06/2018 0946   ALT 16 01/01/2017 0841   ALKPHOS 100 02/06/2018 0946   ALKPHOS 89 01/01/2017 0841   BILITOT 0.4 02/06/2018 0946   BILITOT 0.29 01/01/2017 0841   GFRNONAA 44 (L) 02/06/2018 0946   GFRAA 51 (L) 02/06/2018 0946    No results found  for: SPEP, UPEP  Lab Results  Component Value Date   WBC 4.6 02/06/2018   NEUTROABS 2.4 02/06/2018   HGB 13.7 02/06/2018   HCT 43.3 02/06/2018   MCV 83.9 02/06/2018   PLT 304 02/06/2018      Chemistry      Component Value Date/Time   NA 139 02/06/2018 0946   NA 141 01/01/2017 0841   K 4.5 02/06/2018 0946   K 3.7 01/01/2017 0841   CL 106 02/06/2018 0946   CL 99 11/05/2012 0842   CO2 24 02/06/2018 0946   CO2 25 01/01/2017 0841   BUN 12 02/06/2018 0946   BUN 18.2 01/01/2017 0841   CREATININE 1.26 (H) 02/06/2018 0946   CREATININE 1.3 (H) 01/01/2017 0841   GLU 92 10/12/2017      Component Value Date/Time   CALCIUM 10.0 02/06/2018 0946   CALCIUM 9.5 01/01/2017 0841   ALKPHOS 100 02/06/2018 0946   ALKPHOS 89 01/01/2017 0841   AST 15 02/06/2018 0946   AST 13 01/01/2017 0841   ALT 19 02/06/2018 0946   ALT 16 01/01/2017 0841   BILITOT 0.4 02/06/2018 0946   BILITOT 0.29 01/01/2017 0841    All questions were answered. The patient knows to call the clinic with any problems, questions or concerns. No barriers to learning was detected.  I spent 15 minutes counseling the patient face to face. The total time spent in the appointment was 20 minutes and more than 50% was on counseling and review of test results  [name], MD 02/06/2018 12:48 PM

## 2018-02-06 NOTE — Telephone Encounter (Signed)
-----   Message from [name], MD sent at 02/06/2018 11:42 AM EST ----- Regarding: labs pls let her know all labs ok except for high blood sugar ----- Message ----- From: Interface, Lab In [place] Sent: 02/06/2018  11:37 AM To: [name], MD

## 2018-02-06 NOTE — Assessment & Plan Note (Signed)
Clinically, she has no signs of cancer recurrence.  She is a long-term cancer survivor I reviewed the current NCCN guidelines with the patient. Her last colonoscopy was in 2017. Next due 2020 due to presence of polyps Due to recent intermittent rectal bleeding, I recommend consideration to get  repeat colonoscopy sooner Per guidelines, there is no benefit for further surveillance imaging in the future. We discussed transitioning her care to cancer survivorship with the patient wants to continue follow-up with me.

## 2018-02-10 DIAGNOSIS — E089 Diabetes mellitus due to underlying condition without complications: Secondary | ICD-10-CM | POA: Diagnosis not present

## 2018-02-10 DIAGNOSIS — F5113 Hypersomnia due to other mental disorder: Secondary | ICD-10-CM | POA: Diagnosis not present

## 2018-02-10 DIAGNOSIS — I10 Essential (primary) hypertension: Secondary | ICD-10-CM | POA: Diagnosis not present

## 2018-03-01 LAB — GLUCOSE, POCT (MANUAL RESULT ENTRY): POC GLUCOSE: 103 mg/dL — AB (ref 70–99)

## 2018-03-01 LAB — POCT GLYCOSYLATED HEMOGLOBIN (HGB A1C): Hemoglobin A1C: 8.3

## 2018-03-07 ENCOUNTER — Telehealth: Payer: Self-pay | Admitting: *Deleted

## 2018-03-07 NOTE — Telephone Encounter (Signed)
No answer no voice mail  

## 2018-03-10 NOTE — Telephone Encounter (Signed)
LM to call Dr Gorsuch's nurse 

## 2018-03-11 ENCOUNTER — Other Ambulatory Visit: Payer: Self-pay | Admitting: Hematology and Oncology

## 2018-03-11 DIAGNOSIS — Z85048 Personal history of other malignant neoplasm of rectum, rectosigmoid junction, and anus: Secondary | ICD-10-CM

## 2018-03-12 DIAGNOSIS — F331 Major depressive disorder, recurrent, moderate: Secondary | ICD-10-CM | POA: Diagnosis not present

## 2018-03-12 DIAGNOSIS — F909 Attention-deficit hyperactivity disorder, unspecified type: Secondary | ICD-10-CM | POA: Diagnosis not present

## 2018-04-17 DIAGNOSIS — I10 Essential (primary) hypertension: Secondary | ICD-10-CM | POA: Diagnosis not present

## 2018-04-17 DIAGNOSIS — E1100 Type 2 diabetes mellitus with hyperosmolarity without nonketotic hyperglycemic-hyperosmolar coma (NKHHC): Secondary | ICD-10-CM | POA: Diagnosis not present

## 2018-04-17 DIAGNOSIS — M75101 Unspecified rotator cuff tear or rupture of right shoulder, not specified as traumatic: Secondary | ICD-10-CM | POA: Diagnosis not present

## 2018-04-17 DIAGNOSIS — N189 Chronic kidney disease, unspecified: Secondary | ICD-10-CM | POA: Diagnosis not present

## 2018-04-17 DIAGNOSIS — M130 Polyarthritis, unspecified: Secondary | ICD-10-CM | POA: Diagnosis not present

## 2018-04-17 DIAGNOSIS — F909 Attention-deficit hyperactivity disorder, unspecified type: Secondary | ICD-10-CM | POA: Diagnosis not present

## 2018-04-22 DIAGNOSIS — M25561 Pain in right knee: Secondary | ICD-10-CM | POA: Diagnosis not present

## 2018-04-22 DIAGNOSIS — M7062 Trochanteric bursitis, left hip: Secondary | ICD-10-CM | POA: Diagnosis not present

## 2018-04-22 DIAGNOSIS — M7541 Impingement syndrome of right shoulder: Secondary | ICD-10-CM | POA: Diagnosis not present

## 2018-05-01 ENCOUNTER — Telehealth: Payer: Self-pay

## 2018-05-01 NOTE — Telephone Encounter (Signed)
Tonya Vincent in IR at [facility] called and left message. Patient does not want to get port removed in IR. With her insurance she needs to go to a ambulatory surgery center because it will cost less. Dr. Hulen Skains placed her port in 2011 per Tonya Vincent.

## 2018-05-02 ENCOUNTER — Telehealth: Payer: Self-pay

## 2018-05-02 ENCOUNTER — Other Ambulatory Visit: Payer: Self-pay | Admitting: Hematology and Oncology

## 2018-05-02 DIAGNOSIS — Z85048 Personal history of other malignant neoplasm of rectum, rectosigmoid junction, and anus: Secondary | ICD-10-CM

## 2018-05-02 NOTE — Telephone Encounter (Signed)
Faxed referral to [facility] for port removal at patient request.

## 2018-05-02 NOTE — Telephone Encounter (Signed)
Called and given below message. She is going to call her insurance company and call us back after she clarifies. Instructed to call us back when she knows what she wants to do.

## 2018-05-02 NOTE — Telephone Encounter (Signed)
She called and left a message to call her.  Called back. She called her insurance company and it will be cheaper to go to outpatient surgery center to have her port removed. She does not want have her port removed in IR because it cause more.

## 2018-05-02 NOTE — Telephone Encounter (Signed)
In order to do outpatient surgery, she has to be seen by a surgeon There is no guarantee a surgeon will remove it outpatient but we can try

## 2018-05-02 NOTE — Telephone Encounter (Signed)
Can she ask her insurance agent who is "covered" under her insurance? Typically the cheapest to my knowledge is through IR

## 2018-05-03 LAB — GLUCOSE, POCT (MANUAL RESULT ENTRY): POC Glucose: 171 mg/dl — AB (ref 70–99)

## 2018-05-08 DIAGNOSIS — M6281 Muscle weakness (generalized): Secondary | ICD-10-CM | POA: Diagnosis not present

## 2018-05-08 DIAGNOSIS — M7541 Impingement syndrome of right shoulder: Secondary | ICD-10-CM | POA: Diagnosis not present

## 2018-05-08 DIAGNOSIS — M25552 Pain in left hip: Secondary | ICD-10-CM | POA: Diagnosis not present

## 2018-05-08 DIAGNOSIS — M25511 Pain in right shoulder: Secondary | ICD-10-CM | POA: Diagnosis not present

## 2018-05-12 DIAGNOSIS — M25511 Pain in right shoulder: Secondary | ICD-10-CM | POA: Diagnosis not present

## 2018-05-12 DIAGNOSIS — M6281 Muscle weakness (generalized): Secondary | ICD-10-CM | POA: Diagnosis not present

## 2018-05-12 DIAGNOSIS — M25552 Pain in left hip: Secondary | ICD-10-CM | POA: Diagnosis not present

## 2018-05-12 DIAGNOSIS — M7541 Impingement syndrome of right shoulder: Secondary | ICD-10-CM | POA: Diagnosis not present

## 2018-05-15 DIAGNOSIS — M7541 Impingement syndrome of right shoulder: Secondary | ICD-10-CM | POA: Diagnosis not present

## 2018-05-15 DIAGNOSIS — M25511 Pain in right shoulder: Secondary | ICD-10-CM | POA: Diagnosis not present

## 2018-05-15 DIAGNOSIS — M25552 Pain in left hip: Secondary | ICD-10-CM | POA: Diagnosis not present

## 2018-05-15 DIAGNOSIS — M6281 Muscle weakness (generalized): Secondary | ICD-10-CM | POA: Diagnosis not present

## 2018-05-20 DIAGNOSIS — M25511 Pain in right shoulder: Secondary | ICD-10-CM | POA: Diagnosis not present

## 2018-05-20 DIAGNOSIS — M6281 Muscle weakness (generalized): Secondary | ICD-10-CM | POA: Diagnosis not present

## 2018-05-20 DIAGNOSIS — M25552 Pain in left hip: Secondary | ICD-10-CM | POA: Diagnosis not present

## 2018-05-20 DIAGNOSIS — M7541 Impingement syndrome of right shoulder: Secondary | ICD-10-CM | POA: Diagnosis not present

## 2018-05-23 DIAGNOSIS — M6281 Muscle weakness (generalized): Secondary | ICD-10-CM | POA: Diagnosis not present

## 2018-05-23 DIAGNOSIS — M25552 Pain in left hip: Secondary | ICD-10-CM | POA: Diagnosis not present

## 2018-05-23 DIAGNOSIS — M25511 Pain in right shoulder: Secondary | ICD-10-CM | POA: Diagnosis not present

## 2018-05-23 DIAGNOSIS — M7541 Impingement syndrome of right shoulder: Secondary | ICD-10-CM | POA: Diagnosis not present

## 2018-05-27 DIAGNOSIS — M25552 Pain in left hip: Secondary | ICD-10-CM | POA: Diagnosis not present

## 2018-05-27 DIAGNOSIS — M25511 Pain in right shoulder: Secondary | ICD-10-CM | POA: Diagnosis not present

## 2018-05-27 DIAGNOSIS — M6281 Muscle weakness (generalized): Secondary | ICD-10-CM | POA: Diagnosis not present

## 2018-05-27 DIAGNOSIS — M7541 Impingement syndrome of right shoulder: Secondary | ICD-10-CM | POA: Diagnosis not present

## 2018-06-03 DIAGNOSIS — M25511 Pain in right shoulder: Secondary | ICD-10-CM | POA: Diagnosis not present

## 2018-06-10 DIAGNOSIS — M7541 Impingement syndrome of right shoulder: Secondary | ICD-10-CM | POA: Diagnosis not present

## 2018-06-10 DIAGNOSIS — M25511 Pain in right shoulder: Secondary | ICD-10-CM | POA: Diagnosis not present

## 2018-06-10 DIAGNOSIS — M25552 Pain in left hip: Secondary | ICD-10-CM | POA: Diagnosis not present

## 2018-06-10 DIAGNOSIS — M6281 Muscle weakness (generalized): Secondary | ICD-10-CM | POA: Diagnosis not present

## 2018-06-13 DIAGNOSIS — M25552 Pain in left hip: Secondary | ICD-10-CM | POA: Diagnosis not present

## 2018-06-13 DIAGNOSIS — M6281 Muscle weakness (generalized): Secondary | ICD-10-CM | POA: Diagnosis not present

## 2018-06-13 DIAGNOSIS — M7541 Impingement syndrome of right shoulder: Secondary | ICD-10-CM | POA: Diagnosis not present

## 2018-06-13 DIAGNOSIS — M25511 Pain in right shoulder: Secondary | ICD-10-CM | POA: Diagnosis not present

## 2018-06-20 DIAGNOSIS — M7541 Impingement syndrome of right shoulder: Secondary | ICD-10-CM | POA: Diagnosis not present

## 2018-06-20 DIAGNOSIS — M25552 Pain in left hip: Secondary | ICD-10-CM | POA: Diagnosis not present

## 2018-06-20 DIAGNOSIS — M7062 Trochanteric bursitis, left hip: Secondary | ICD-10-CM | POA: Diagnosis not present

## 2018-06-20 DIAGNOSIS — M6281 Muscle weakness (generalized): Secondary | ICD-10-CM | POA: Diagnosis not present

## 2018-06-26 LAB — GLUCOSE, POCT (MANUAL RESULT ENTRY): POC Glucose: 171 mg/dl — AB (ref 70–99)

## 2018-06-27 DIAGNOSIS — M7062 Trochanteric bursitis, left hip: Secondary | ICD-10-CM | POA: Diagnosis not present

## 2018-06-27 DIAGNOSIS — M25552 Pain in left hip: Secondary | ICD-10-CM | POA: Diagnosis not present

## 2018-06-27 DIAGNOSIS — M7541 Impingement syndrome of right shoulder: Secondary | ICD-10-CM | POA: Diagnosis not present

## 2018-06-27 DIAGNOSIS — M6281 Muscle weakness (generalized): Secondary | ICD-10-CM | POA: Diagnosis not present

## 2018-07-01 DIAGNOSIS — M25511 Pain in right shoulder: Secondary | ICD-10-CM | POA: Diagnosis not present

## 2018-07-01 DIAGNOSIS — M19011 Primary osteoarthritis, right shoulder: Secondary | ICD-10-CM | POA: Diagnosis not present

## 2018-07-03 DIAGNOSIS — M25552 Pain in left hip: Secondary | ICD-10-CM | POA: Diagnosis not present

## 2018-07-03 DIAGNOSIS — M6281 Muscle weakness (generalized): Secondary | ICD-10-CM | POA: Diagnosis not present

## 2018-07-03 DIAGNOSIS — M25511 Pain in right shoulder: Secondary | ICD-10-CM | POA: Diagnosis not present

## 2018-07-03 DIAGNOSIS — M7541 Impingement syndrome of right shoulder: Secondary | ICD-10-CM | POA: Diagnosis not present

## 2018-07-11 DIAGNOSIS — M25552 Pain in left hip: Secondary | ICD-10-CM | POA: Diagnosis not present

## 2018-07-11 DIAGNOSIS — M6281 Muscle weakness (generalized): Secondary | ICD-10-CM | POA: Diagnosis not present

## 2018-07-11 DIAGNOSIS — M7541 Impingement syndrome of right shoulder: Secondary | ICD-10-CM | POA: Diagnosis not present

## 2018-07-11 DIAGNOSIS — M25511 Pain in right shoulder: Secondary | ICD-10-CM | POA: Diagnosis not present

## 2018-07-23 DIAGNOSIS — Z0000 Encounter for general adult medical examination without abnormal findings: Secondary | ICD-10-CM | POA: Diagnosis not present

## 2018-07-23 DIAGNOSIS — F331 Major depressive disorder, recurrent, moderate: Secondary | ICD-10-CM | POA: Diagnosis not present

## 2018-07-23 DIAGNOSIS — F423 Hoarding disorder: Secondary | ICD-10-CM | POA: Diagnosis not present

## 2018-07-23 DIAGNOSIS — E1100 Type 2 diabetes mellitus with hyperosmolarity without nonketotic hyperglycemic-hyperosmolar coma (NKHHC): Secondary | ICD-10-CM | POA: Diagnosis not present

## 2018-07-23 DIAGNOSIS — I10 Essential (primary) hypertension: Secondary | ICD-10-CM | POA: Diagnosis not present

## 2018-07-25 DIAGNOSIS — M25511 Pain in right shoulder: Secondary | ICD-10-CM | POA: Diagnosis not present

## 2018-07-25 DIAGNOSIS — M25552 Pain in left hip: Secondary | ICD-10-CM | POA: Diagnosis not present

## 2018-07-25 DIAGNOSIS — M6281 Muscle weakness (generalized): Secondary | ICD-10-CM | POA: Diagnosis not present

## 2018-07-25 DIAGNOSIS — M7541 Impingement syndrome of right shoulder: Secondary | ICD-10-CM | POA: Diagnosis not present

## 2018-08-01 DIAGNOSIS — M6281 Muscle weakness (generalized): Secondary | ICD-10-CM | POA: Diagnosis not present

## 2018-08-01 DIAGNOSIS — M25552 Pain in left hip: Secondary | ICD-10-CM | POA: Diagnosis not present

## 2018-08-01 DIAGNOSIS — M25562 Pain in left knee: Secondary | ICD-10-CM | POA: Diagnosis not present

## 2018-08-01 DIAGNOSIS — M25511 Pain in right shoulder: Secondary | ICD-10-CM | POA: Diagnosis not present

## 2018-08-08 DIAGNOSIS — M7541 Impingement syndrome of right shoulder: Secondary | ICD-10-CM | POA: Diagnosis not present

## 2018-08-08 DIAGNOSIS — M6281 Muscle weakness (generalized): Secondary | ICD-10-CM | POA: Diagnosis not present

## 2018-08-08 DIAGNOSIS — M25511 Pain in right shoulder: Secondary | ICD-10-CM | POA: Diagnosis not present

## 2018-08-08 DIAGNOSIS — M25552 Pain in left hip: Secondary | ICD-10-CM | POA: Diagnosis not present

## 2018-08-15 DIAGNOSIS — M25552 Pain in left hip: Secondary | ICD-10-CM | POA: Diagnosis not present

## 2018-08-15 DIAGNOSIS — M25511 Pain in right shoulder: Secondary | ICD-10-CM | POA: Diagnosis not present

## 2018-08-15 DIAGNOSIS — M6281 Muscle weakness (generalized): Secondary | ICD-10-CM | POA: Diagnosis not present

## 2018-08-15 DIAGNOSIS — M7541 Impingement syndrome of right shoulder: Secondary | ICD-10-CM | POA: Diagnosis not present

## 2018-09-05 DIAGNOSIS — H04123 Dry eye syndrome of bilateral lacrimal glands: Secondary | ICD-10-CM | POA: Diagnosis not present

## 2018-09-05 DIAGNOSIS — E119 Type 2 diabetes mellitus without complications: Secondary | ICD-10-CM | POA: Diagnosis not present

## 2018-10-11 LAB — GLUCOSE, POCT (MANUAL RESULT ENTRY): POC Glucose: 121 mg/dl — AB (ref 70–99)

## 2018-10-16 DIAGNOSIS — H401131 Primary open-angle glaucoma, bilateral, mild stage: Secondary | ICD-10-CM | POA: Diagnosis not present

## 2018-11-18 DIAGNOSIS — Z6834 Body mass index (BMI) 34.0-34.9, adult: Secondary | ICD-10-CM | POA: Diagnosis not present

## 2018-11-18 DIAGNOSIS — M25512 Pain in left shoulder: Secondary | ICD-10-CM | POA: Diagnosis not present

## 2018-11-28 DIAGNOSIS — Z1231 Encounter for screening mammogram for malignant neoplasm of breast: Secondary | ICD-10-CM | POA: Diagnosis not present

## 2018-11-28 DIAGNOSIS — Z803 Family history of malignant neoplasm of breast: Secondary | ICD-10-CM | POA: Diagnosis not present

## 2018-11-28 DIAGNOSIS — H40023 Open angle with borderline findings, high risk, bilateral: Secondary | ICD-10-CM | POA: Diagnosis not present

## 2018-12-10 ENCOUNTER — Encounter (HOSPITAL_COMMUNITY): Payer: Self-pay

## 2018-12-10 ENCOUNTER — Other Ambulatory Visit: Payer: Self-pay

## 2018-12-10 ENCOUNTER — Ambulatory Visit (HOSPITAL_COMMUNITY): Payer: PPO | Attending: Family Medicine

## 2018-12-10 DIAGNOSIS — M25561 Pain in right knee: Secondary | ICD-10-CM | POA: Insufficient documentation

## 2018-12-10 DIAGNOSIS — M25512 Pain in left shoulder: Secondary | ICD-10-CM | POA: Insufficient documentation

## 2018-12-10 DIAGNOSIS — G8929 Other chronic pain: Secondary | ICD-10-CM | POA: Diagnosis not present

## 2018-12-10 DIAGNOSIS — R29898 Other symptoms and signs involving the musculoskeletal system: Secondary | ICD-10-CM | POA: Diagnosis not present

## 2018-12-10 NOTE — Patient Instructions (Signed)
  Copyright  VHI. All rights reserved.  [place] Stretch, Sitting   Sit, one hand tucked under hip on side to be stretched, other hand over top of head. Gently pull head to side and backwards. Hold __15-30_ seconds. For more stretch, lean body in direction of head pull. Repeat __2_ times per session. Do ___ sessions per day.   Complete the following exercises 2-3 times a day.  Doorway Stretch  Place each hand opposite each other on the doorway. (You can change where you feel the stretch by moving arms higher or lower.) Step through with one foot and bend front knee until a stretch is felt and hold. Step through with the opposite foot on the next rep. Hold for __15-30___ seconds. Repeat __2__times.    Towel Internal Rotation Stretch  Hold a towel in both hands. Bring one hand behind your body and with the other, reach behind your neck and use the towel to gently pull the other hand behind the back until you feel a stretch in the shoulder.   Hold 15-30 seconds. Complete 2 times.

## 2018-12-11 NOTE — Therapy (Signed)
[place], [place], [address] Phone: [phone]   Fax:  [phone]  Occupational Therapy Evaluation  Patient Details  Name: Tonya Vincent MRN: [id number] Date of Birth: [birth year] Referring Provider (OT): Dr. [name]   Encounter Date: 12/10/2018  OT End of Session - 12/10/18 1623    Visit Number  1    Number of Visits  4    Date for OT Re-Evaluation  01/07/19    Authorization Type  [employer]    Authorization Time Period  $15 copay    OT Start Time  1520    OT Stop Time  1600    OT Time Calculation (min)  40 min    Activity Tolerance  Patient tolerated treatment well    Behavior During Therapy  [facility] for tasks assessed/performed       Past Medical History:  Diagnosis Date  . Anemia   . Anxiety   . Depression   . Diabetes mellitus   . Heart murmur   . History of rectal cancer 10/04/2011  . Hypertension   . Malignant neoplasm of rectum ([place]) 10/04/2011  . 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 rectal ca` dx'd 11/16/09   xrt comp 01/2010; chemo comp 01/2011  . Tubular adenoma of colon [id number]    Past Surgical History:  Procedure Laterality Date  . ABDOMINAL HYSTERECTOMY     for uterine fibroids  . COLON SURGERY N/A 04/24/2010   "took out half rectum"; rectal cancer  . HAND SURGERY     carpal tunnel- left  . [place]  . PORTACATH PLACEMENT     2011    There were no vitals filed for this visit.  Subjective Assessment - 12/10/18 1523    Subjective   S: It's always been painful but recently it's gotten worse.    Pertinent History  Patient is a 68 y/o female S/P left shoulder pain which has been ongoing for approximately 6 months with the pain becoming worse in the past 6 weeks. Dr. Criss Rosales has referred patient to occupational therapy for evaluation and treatment.     Special Tests  FOTO score: 55/100    Patient Stated Goals  To decrease the pain and be able to use her left  arm with more comfort.     Currently in Pain?  Yes    Pain Score  3     Pain Location  Shoulder    Pain Orientation  Left    Pain Descriptors / Indicators  Radiating;Aching;Tender    Pain Type  Chronic pain    Pain Radiating Towards  sometimes feels like it radiates from the neck down to elbow    Pain Onset  Other (comment)    Pain Frequency  Constant    Aggravating Factors   lifting arm up, movement and use, laying bed.     Pain Relieving Factors  CBD oil help a little, heat, menthol pain cream/rub    Effect of Pain on Daily Activities  patient tries to push through.     Multiple Pain Sites  No        [facility] OT Assessment - 12/10/18 1530      Assessment   Medical Diagnosis  Left shoulder pain    Referring Provider (OT)  Dr. [name]    Onset Date/Surgical Date  --   approximately 6 months ago   Hand Dominance  Right    Next MD Visit  --  February 2020   Prior Therapy  None for this condition      Precautions   Precautions  None      Restrictions   Weight Bearing Restrictions  No      Balance Screen   Has the patient fallen in the past 6 months  No      Home  Environment   Family/patient expects to be discharged to:  Private residence      Prior Function   Level of [place]  Retired      ADL   ADL comments  Pt reports that she has pain with raising her arm up and using it in general. She has discomfort when reaching back for the seat belt.       Mobility   Mobility Status  Independent      Written Expression   Dominant Hand  Right      Vision - History   Baseline Vision  Wears glasses all the time      Cognition   Overall Cognitive Status  Within Functional Limits for tasks assessed      Observation/Other Assessments   Observations  Chandelliar Test: postitive. Empty Can Test, Belly press test, and lift off test: negative    Focus on Therapeutic Outcomes (FOTO)   55/100      Posture/Postural Control   Posture/Postural  Control  Postural limitations    Postural Limitations  Forward head;Rounded Shoulders      ROM / Strength   AROM / PROM / Strength  Strength;PROM;AROM      Palpation   Palpation comment  Min fascial restrictions in the left upper arm, trapezius, scapularis and cervical region.       AROM   Overall AROM   Within functional limits for tasks performed    Overall AROM Comments  Assessed seated. IR/er abducted      PROM   Overall PROM   Within functional limits for tasks performed      Strength   Overall Strength Comments  Assessed seated. IR/er abducted.    Strength Assessment Site  Shoulder    Right/Left Shoulder  Left    Left Shoulder Flexion  4/5   right: 5/5   Left Shoulder ABduction  5/5   right: 5/5   Left Shoulder Internal Rotation  4-/5   right: 5/5   Left Shoulder External Rotation  5/5   right: 5/5                     OT Education - 12/10/18 1622    Education Details  cervical stretch, doorway stretch, internal rotation stretch    Person(s) Educated  Patient    Methods  Explanation;Demonstration;Verbal cues;Handout    Comprehension  Verbalized understanding;Returned demonstration;Verbal cues required       OT Short Term Goals - 12/10/18 1631      OT SHORT TERM GOAL #1   Title  Patient will be educated and independent with HEP in order to faciliate progress in therapy and allow her to use her LUE with increased comfort.    Time  4    Period  Weeks    Status  New    Target Date  01/07/19      OT SHORT TERM GOAL #2   Title  Patient will increase her LUE strength to 4+/5 overall in order to return to completing normal household lifting tasks with less difficulty.     Time  4    Period  Weeks    Status  New      OT SHORT TERM GOAL #3   Title  Patient will decrease fascial restrictions to trace amount in her LUE in order to increase functional mobility  needed to complete reaching tasks.     Time  4    Period  Weeks    Status  New      OT  SHORT TERM GOAL #4   Title  Patient will report a decrease in pain while using her LUE for daily tasks of approximately 2/10 or less.     Time  4    Period  Weeks    Status  New               Plan - 12/10/18 1624    Clinical Impression Statement  A: Patient is a 68 y/o female S/P left shoulder pain with no known cause. Pain is causing increased fascial restrictions and decreased strength resulting in difficulty completing daily tasks using her LUE. Due to copay, patient would like to attend therapy once a week for 4 weeks.     Occupational Profile and client history currently impacting functional performance  Patient is motivated to return to prior level of function.     Occupational performance deficits (Please refer to evaluation for details):  Rest and Sleep;ADL's;Leisure;IADL's    Rehab Potential  Excellent    Current Impairments/barriers affecting progress:  Chronic pain with unknown cause.     OT Frequency  1x / week    OT Duration  4 weeks    OT Treatment/Interventions  Self-care/ADL training;Moist Heat;DME and/or AE instruction;Therapeutic activities;Therapeutic exercise;Ultrasound;Cryotherapy;Electrical Stimulation;Manual Therapy;Patient/family education;Passive range of motion;Neuromuscular education    Plan  P: Pt will benefit from skilled OT services to increase functional performance during daily tasks while using her LUE. Treatment plan: Update HEP at each treatment session as needed. Myofascial release, A/ROM, general shoulder and scapular strengthening. Modalities PRN.    Clinical Decision Making  Limited treatment options, no task modification necessary    Consulted and Agree with Plan of Care  Patient       Patient will benefit from skilled therapeutic intervention in order to improve the following deficits and impairments:  Increased fascial restrictions, Pain, Impaired UE functional use, Decreased strength  Visit Diagnosis: Other symptoms and signs involving the  musculoskeletal system - Plan: Ot plan of care cert/re-cert  Chronic left shoulder pain - Plan: Ot plan of care cert/re-cert    Problem List Patient Active Problem List   Diagnosis Date Noted  . Bright red rectal bleeding 02/06/2018  . Microcytosis 01/01/2017  . Essential hypertension 01/02/2016  . Change in stool caliber 12/31/2014  . 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 Anemia in chronic illness 12/29/2014  . History of rectal cancer 10/04/2011   Ailene Ravel, OTR/L,CBIS  [phone]  12/11/2018, 8:06 AM  [place] [address] [place], [place], [address] Phone: [phone]   Fax:  [phone]  Name: Tonya Vincent MRN: [id number] Date of Birth: Jul 11, 1951

## 2018-12-17 ENCOUNTER — Encounter (HOSPITAL_COMMUNITY): Payer: Self-pay

## 2018-12-17 ENCOUNTER — Ambulatory Visit (HOSPITAL_COMMUNITY): Payer: PPO

## 2018-12-17 DIAGNOSIS — M25561 Pain in right knee: Secondary | ICD-10-CM

## 2018-12-17 DIAGNOSIS — R29898 Other symptoms and signs involving the musculoskeletal system: Secondary | ICD-10-CM

## 2018-12-17 DIAGNOSIS — M25512 Pain in left shoulder: Secondary | ICD-10-CM

## 2018-12-17 DIAGNOSIS — G8929 Other chronic pain: Secondary | ICD-10-CM

## 2018-12-17 NOTE — Therapy (Signed)
[place], [place], [address] Phone: [phone]   Fax:  [phone]  Occupational Therapy Treatment  Patient Details  Name: Tonya Vincent MRN: [id number] Date of Birth: [birth year] Referring Provider (OT): Dr. [name]   Encounter Date: 12/17/2018  OT End of Session - 12/17/18 1634    Visit Number  2    Number of Visits  4    Date for OT Re-Evaluation  01/07/19    Authorization Type  [employer]    Authorization Time Period  $15 copay    OT Start Time  1518    OT Stop Time  1600    OT Time Calculation (min)  42 min    Activity Tolerance  Patient tolerated treatment well    Behavior During Therapy  [facility] for tasks assessed/performed       Past Medical History:  Diagnosis Date  . Anemia   . Anxiety   . Depression   . Diabetes mellitus   . Heart murmur   . History of rectal cancer 10/04/2011  . Hypertension   . Malignant neoplasm of rectum ([place]) 10/04/2011  . 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 rectal ca` dx'd 11/16/09   xrt comp 01/2010; chemo comp 01/2011  . Tubular adenoma of colon [id number]    Past Surgical History:  Procedure Laterality Date  . ABDOMINAL HYSTERECTOMY     for uterine fibroids  . COLON SURGERY N/A 04/24/2010   "took out half rectum"; rectal cancer  . HAND SURGERY     carpal tunnel- left  . [place]  . PORTACATH PLACEMENT     2011    There were no vitals filed for this visit.  Subjective Assessment - 12/17/18 1536    Subjective   S: I lost the exercise worksheet.    Currently in Pain?  Yes    Pain Score  4     Pain Location  Shoulder    Pain Orientation  Left    Pain Descriptors / Indicators  Sore;Constant    Pain Type  Chronic pain    Pain Radiating Towards  From elbow to neck    Pain Onset  More than a month ago    Pain Frequency  Constant    Aggravating Factors   watching tv in bed with head turned to the right.     Pain Relieving Factors   heat, massager, menthol pain cream/rub    Effect of Pain on Daily Activities  patient tries to push through.     Multiple Pain Sites  No         [facility] OT Assessment - 12/17/18 1541      Assessment   Medical Diagnosis  Left shoulder pain      Precautions   Precautions  None               OT Treatments/Exercises (OP) - 12/17/18 1541      Exercises   Exercises  Shoulder;Neck      Neck Exercises: Stretches   Neck Stretch  1 rep;30 seconds      Shoulder Exercises: Supine   Protraction  PROM;5 reps;AROM;10 reps    Horizontal ABduction  PROM;5 reps;AROM;10 reps    External Rotation  PROM;5 reps;AROM;10 reps    Internal Rotation  PROM;5 reps;AROM;10 reps    Flexion  PROM;5 reps;AROM;10 reps    ABduction  PROM;5 reps      Shoulder Exercises: Standing   Protraction  AAROM;10 reps    External Rotation  AROM;10 reps    Internal Rotation  AROM;10 reps    Flexion  AAROM;10 reps      Shoulder Exercises: ROM/Strengthening   Wall Wash  1'    Proximal Shoulder Strengthening, Supine  10X A/ROM    Proximal Shoulder Strengthening, Seated  10X A/ROM     Other ROM/Strengthening Exercises  Proximal shoulder strengthening with washcloth on door; 1'   changed directions after 30"            OT Education - 12/17/18 1642    Education Details  reviewed therapy goals. Verbally instructed patient to complete proximal shoulder strengthening using door at home.     Person(s) Educated  Patient    Methods  Explanation    Comprehension  Verbalized understanding       OT Short Term Goals - 12/17/18 1541      OT SHORT TERM GOAL #1   Title  Patient will be educated and independent with HEP in order to faciliate progress in therapy and allow her to use her LUE with increased comfort.    Time  4    Period  Weeks    Status  On-going      OT SHORT TERM GOAL #2   Title  Patient will increase her LUE strength to 4+/5 overall in order to return to completing normal household lifting  tasks with less difficulty.     Time  4    Period  Weeks    Status  On-going      OT SHORT TERM GOAL #3   Title  Patient will decrease fascial restrictions to trace amount in her LUE in order to increase functional mobility  needed to complete reaching tasks.     Time  4    Period  Weeks    Status  On-going      OT SHORT TERM GOAL #4   Title  Patient will report a decrease in pain while using her LUE for daily tasks of approximately 2/10 or less.     Time  4    Period  Weeks    Status  On-going               Plan - 12/17/18 1634    Clinical Impression Statement  A: Initiated myofascial release, manual stretching, and A/ROM. Patient reports increased discomfort in shoulder and neck following watching TV at home in bed due to rotation of neck. AA/ROM was completed standing as patient experienced pain during exercises without and could not complete full movement. VC for form and technique.     Plan  P: Follow up on shoulder pain and HEP. Attempt A/ROM standing and scapular theraband if able to tolerate. Update HEP as needed.     Consulted and Agree with Plan of Care  Patient       Patient will benefit from skilled therapeutic intervention in order to improve the following deficits and impairments:  Increased fascial restrictions, Pain, Impaired UE functional use, Decreased strength  Visit Diagnosis: Other symptoms and signs involving the musculoskeletal system  Chronic left shoulder pain  Chronic pain of right knee    Problem List Patient Active Problem List   Diagnosis Date Noted  . Bright red rectal bleeding 02/06/2018  . Microcytosis 01/01/2017  . Essential hypertension 01/02/2016  . Change in stool caliber 12/31/2014  . 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 Anemia in chronic illness 12/29/2014  . History of rectal cancer 10/04/2011  Ailene Ravel, OTR/L,CBIS  [phone]  12/17/2018, 4:43 PM  [place] [address] [place], [place], [address] Phone: [phone]   Fax:  [phone]  Name: Tonya Vincent MRN: [id number] Date of Birth: 1951/04/22

## 2018-12-19 ENCOUNTER — Telehealth (HOSPITAL_COMMUNITY): Payer: Self-pay

## 2018-12-19 NOTE — Telephone Encounter (Signed)
Called pt she states to cx the apptment  01/07/2019 and she will let us know if she wants to r/s-she might be out of town that day. Nf 1/17/202

## 2018-12-31 ENCOUNTER — Ambulatory Visit (HOSPITAL_COMMUNITY): Payer: PPO

## 2019-01-07 ENCOUNTER — Encounter (HOSPITAL_COMMUNITY): Payer: PPO

## 2019-01-12 ENCOUNTER — Telehealth: Payer: Self-pay | Admitting: *Deleted

## 2019-01-12 ENCOUNTER — Inpatient Hospital Stay: Payer: PPO | Admitting: Medical

## 2019-01-12 ENCOUNTER — Ambulatory Visit (HOSPITAL_COMMUNITY)
Admission: RE | Admit: 2019-01-12 | Discharge: 2019-01-12 | Disposition: A | Discharge status: Home / Self Care | Payer: PPO | Source: Ambulatory Visit | Attending: Medical | Admitting: Medical

## 2019-01-12 ENCOUNTER — Telehealth (HOSPITAL_COMMUNITY): Payer: Self-pay | Admitting: Family Medicine

## 2019-01-12 ENCOUNTER — Other Ambulatory Visit: Payer: Self-pay | Admitting: *Deleted

## 2019-01-12 ENCOUNTER — Inpatient Hospital Stay: Payer: PPO | Attending: Medical | Admitting: Medical

## 2019-01-12 ENCOUNTER — Other Ambulatory Visit: Payer: Self-pay | Admitting: Hematology and Oncology

## 2019-01-12 VITALS — BP 148/83 | HR 78 | Temp 98.3°F | Resp 18 | Ht 66.0 in | Wt 197.6 lb

## 2019-01-12 DIAGNOSIS — M7989 Other specified soft tissue disorders: Secondary | ICD-10-CM | POA: Insufficient documentation

## 2019-01-12 DIAGNOSIS — Z85048 Personal history of other malignant neoplasm of rectum, rectosigmoid junction, and anus: Secondary | ICD-10-CM

## 2019-01-12 LAB — CBC WITH DIFFERENTIAL (CANCER CENTER ONLY)
Abs Immature Granulocytes: 0.02 10*3/uL (ref 0.00–0.07)
BASOS PCT: 1 %
Basophils Absolute: 0 10*3/uL (ref 0.0–0.1)
EOS ABS: 0.1 10*3/uL (ref 0.0–0.5)
EOS PCT: 3 %
HEMATOCRIT: 43.3 % (ref 36.0–46.0)
Hemoglobin: 13.3 g/dL (ref 12.0–15.0)
IMMATURE GRANULOCYTES: 0 %
LYMPHS ABS: 1.5 10*3/uL (ref 0.7–4.0)
Lymphocytes Relative: 31 %
MCH: 25.7 pg — AB (ref 26.0–34.0)
MCHC: 30.7 g/dL (ref 30.0–36.0)
MCV: 83.8 fL (ref 80.0–100.0)
MONO ABS: 0.4 10*3/uL (ref 0.1–1.0)
MONOS PCT: 9 %
Neutro Abs: 2.7 10*3/uL (ref 1.7–7.7)
Neutrophils Relative %: 56 %
PLATELETS: 294 10*3/uL (ref 150–400)
RBC: 5.17 MIL/uL — ABNORMAL HIGH (ref 3.87–5.11)
RDW: 17.2 % — AB (ref 11.5–15.5)
WBC Count: 4.8 10*3/uL (ref 4.0–10.5)
nRBC: 0 % (ref 0.0–0.2)

## 2019-01-12 LAB — CMP (CANCER CENTER ONLY)
ALBUMIN: 4 g/dL (ref 3.5–5.0)
ALK PHOS: 100 U/L (ref 38–126)
ALT: 16 U/L (ref 0–44)
ANION GAP: 10 (ref 5–15)
AST: 12 U/L — ABNORMAL LOW (ref 15–41)
BILIRUBIN TOTAL: 0.3 mg/dL (ref 0.3–1.2)
BUN: 10 mg/dL (ref 8–23)
CALCIUM: 9.4 mg/dL (ref 8.9–10.3)
CO2: 26 mmol/L (ref 22–32)
CREATININE: 1.24 mg/dL — AB (ref 0.44–1.00)
Chloride: 105 mmol/L (ref 98–111)
GFR, Est AFR Am: 52 mL/min — ABNORMAL LOW (ref >60)
GFR, Estimated: 45 mL/min — ABNORMAL LOW (ref >60)
GLUCOSE: 228 mg/dL — AB (ref 70–99)
Potassium: 4.4 mmol/L (ref 3.5–5.1)
SODIUM: 141 mmol/L (ref 135–145)
TOTAL PROTEIN: 7.8 g/dL (ref 6.5–8.1)

## 2019-01-12 NOTE — Progress Notes (Signed)
These preliminary result these preliminary results were noted.  Awaiting final report.

## 2019-01-12 NOTE — Patient Instructions (Signed)
Implanted Port Removal  Implanted port removal is a procedure to remove the port and catheter that are implanted under your skin. The port is a small disc under your skin that can be punctured with a needle. It is connected to a vein in your chest or neck by a small flexible tube (catheter). The implanted port is used to give medicines for treatments, and it may also be used to take blood samples. Your health care provider will remove the implanted port if:  You no longer need it for treatment.  It is not working properly.  The area around it gets infected. Tell a health care provider about:  Any allergies you have.  All medicines you are taking, including vitamins, herbs, eye drops, creams, and over-the-counter medicines.  Any problems you or family members have had with anesthetic medicines.  Any blood disorders you have.  Any surgeries you have had.  Any medical conditions you have.  Whether you are pregnant or may be pregnant. What are the risks? Generally, this is a safe procedure. However, problems may occur, including:  Infection.  Bleeding.  Allergic reactions to anesthetic medicines.  Damage to nerves or blood vessels. What happens before the procedure? Medicines  Ask your health care provider about: ? Changing or stopping your regular medicines. This is especially important if you are taking diabetes medicines or blood thinners. ? Taking medicines such as aspirin and ibuprofen. These medicines can thin your blood. Do not take these medicines unless your health care provider tells you to take them. ? Taking over-the-counter medicines, vitamins, herbs, and supplements. General instructions  You will have: ? A physical exam. ? Blood tests. ? Imaging tests, including a chest X-ray.  Follow instructions from your health care provider about eating or drinking restrictions.  Ask your health care provider how your surgical site will be marked or identified.  Ask  your health care provider what steps will be taken to help prevent infection. These may include: ? Removing hair at the surgery site. ? Washing skin with a germ-killing soap. ? Antibiotic medicine.  Plan to have someone take you home from the hospital or clinic.  If you will be going home right after the procedure, plan to have a responsible adult care for you for at least 24 hours after you leave the hospital or clinic. This is important. What happens during the procedure?  You may be given one or more of the following: ? A medicine to help you relax (sedative). ? A medicine to numb the area (local anesthetic).  A small incision will be made at the site of your implanted port.  The implanted port and the catheter that has been inside your vein will be gently removed.  The port and catheter will be inspected to make sure that all the parts have been removed. Part of the catheter may be tested for bacteria.  The incision will be closed with stitches (sutures), adhesive strips, or skin glue.  A bandage (dressing) will be placed over the incision. The health care provider may apply gentle pressure over the dressing for about 5 minutes. The procedure may vary among health care providers and hospitals. What happens after the procedure?  Your blood pressure, heart rate, breathing rate, and blood oxygen level will be monitored until you leave the hospital or clinic.  You will be monitored to make sure that there is no bleeding from the site where the port was removed.  Do not drive for 24 hours  Your blood pressure, heart rate, breathing rate, and blood oxygen level will be monitored until you leave the hospital or clinic.  · You will be monitored to make sure that there is no bleeding from the site where the port was removed.  · Do not drive for 24 hours if you were given a sedative during your procedure.  Summary  · Implanted port removal is a procedure to remove the port and catheter that are implanted under your skin.  · Before the procedure, follow your health care provider's instructions about changing or stopping your regular medicines. This is especially important if you are taking diabetes medicines or blood thinners.  · If you will be going  home right after the procedure, plan to have a responsible adult care for you for at least 24 hours after you leave the hospital or clinic.  This information is not intended to replace advice given to you by your health care provider. Make sure you discuss any questions you have with your health care provider.  Document Released: 10/31/2015 Document Revised: 01/02/2018 Document Reviewed: 01/02/2018  Elsevier Interactive Patient Education © 2019 [employer]

## 2019-01-12 NOTE — Progress Notes (Signed)
Left upper extremity venous duplex has been completed. Preliminary results can be found in CV Proc through chart review.  Results were given to Sandi Mealy PA.  01/12/19 3:07 PM Tonya Vincent RVT

## 2019-01-12 NOTE — Telephone Encounter (Signed)
2/10 pt left a message to cancel this appt but no reason was given

## 2019-01-12 NOTE — Telephone Encounter (Signed)
Received VM message from patient regarding her port.  She states she has had her port since 2011 but has not had to use it in years.. she also has not had it flushed in at least 3 years.   She states that now the port area is painful, her shoulder and neck are painful as well.  She states she cannot feel the internal 'tubing ' part of the port near her neck like she used to. She states she wants it removed.. she states she should have had it removed  Quite awhile ago but neglected to do it.

## 2019-01-12 NOTE — Telephone Encounter (Signed)
Yes pls see [name]. The order I placed last year should still be valid

## 2019-01-12 NOTE — Progress Notes (Signed)
[facility] Progress Note   Tonya Vincent [id number] [birth year] 68 y.o.  Tonya Vincent is managed by Dr. [name]  Actively treated with chemotherapy/immunotherapy/hormonal therapy: no  Assessment: Plan:    Left arm swelling - Plan: VAS [place] UPPER EXTREMITY VENOUS DUPLEX, Ambulatory referral to General Surgery   Left arm swelling with left chest wall Port-A-Cath pain: The patient was referred for an ultrasound of her left upper extremity which showed no evidence of a DVT.  The patient has been referred to Dr. Alphonsa Overall for a left chest wall Port-A-Cath explantation.  Please see After Visit Summary for patient specific instructions.  Future Appointments  Date Time Provider [place]  02/04/2019  9:00 AM [facility] 6 [facility] None  02/04/2019  9:30 AM [name], MD [facility] None    Orders Placed This Encounter  Procedures  . Ambulatory referral to General Surgery       Subjective:   Patient ID:  Tonya Vincent is a 68 y.o. (DOB Aug 11, 1951) female.  Chief Complaint: No chief complaint on file.   HPI Tonya Vincent   is a 68 year old female who is followed conservatively by Dr. [name].  She was last seen by Dr. [name] on 02/06/2018.  The patient has a history of rectal cancer.  She is a long-term cancer survivor.  Dr. [name] had placed an order for removal of her left chest port on 05/02/2018.  The patient never had this done.  She reports discomfort in her left chest port over the last month with most recent left breast swelling and pain in her left arm which extends into her left neck and left shoulder.  She states that she has had a port for 9 years but has not had it flushed for 6 years.  She denies any other issues of concern.  She was referred for an ultrasound of her left upper extremity today which returned without evidence of a DVT.  Medications: I have reviewed the patient's current  medications.  Allergies:  Allergies  Allergen Reactions  . Antibacterial Hand Soap [Triclosan] Dermatitis  . Norgesic [Orphenadrine-Aspirin-Caffeine]     hives    Past Medical History:  Diagnosis Date  . Anemia   . Anxiety   . Depression   . Diabetes mellitus   . Heart murmur   . History of rectal cancer 10/04/2011  . Hypertension   . Malignant neoplasm of rectum ([place]) 10/04/2011  . 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 rectal ca` dx'd 11/16/09   xrt comp 01/2010; chemo comp 01/2011  . Tubular adenoma of colon [id number]    Past Surgical History:  Procedure Laterality Date  . ABDOMINAL HYSTERECTOMY     for uterine fibroids  . COLON SURGERY N/A 04/24/2010   "took out half rectum"; rectal cancer  . HAND SURGERY     carpal tunnel- left  . [place]  . PORTACATH PLACEMENT     2011    Family History  Problem Relation Age of Onset  . Leukemia Father 50  . Colon cancer Neg Hx   . Esophageal cancer Neg Hx   . Stomach cancer Neg Hx   . Rectal cancer Neg Hx     Social History   Socioeconomic History  . Marital status: Single    Spouse name: Not on file  . Number of children: Not on file  . Years of education: Not on file  . Highest education level: Not on file  Occupational History  . Not on file  Social Needs  . Financial resource strain: Not on file  . Food insecurity:    Worry: Not on file    Inability: Not on file  . Transportation needs:    Medical: Not on file    Non-medical: Not on file  Tobacco Use  . Smoking status: Former Smoker    Packs/day: 0.50    Years: 35.00    Pack years: 17.50    Types: Cigarettes, Cigars    Last attempt to quit: 12/03/2009    Years since quitting: 9.1  . Smokeless tobacco: Never Used  Substance and Sexual Activity  . Alcohol use: Yes    Comment: socially  . Drug use: No  . Sexual activity: Not on file  Lifestyle  . Physical activity:    Days per week: Not on file    Minutes per session: Not on file  .  Stress: Not on file  Relationships  . Social connections:    Talks on phone: Not on file    Gets together: Not on file    Attends religious service: Not on file    Active member of club or organization: Not on file    Attends meetings of clubs or organizations: Not on file    Relationship status: Not on file  . Intimate partner violence:    Fear of current or ex partner: Not on file    Emotionally abused: Not on file    Physically abused: Not on file    Forced sexual activity: Not on file  Other Topics Concern  . Not on file  Social History Narrative  . Not on file    Past Medical History, Surgical history, Social history, and Family history were reviewed and updated as appropriate.   Please see review of systems for further details on the patient's review from today.   Review of Systems:  Review of Systems  Constitutional: Negative for chills, diaphoresis and fever.  HENT: Negative for trouble swallowing and voice change.        Left neck pain.  Respiratory: Negative for cough, chest tightness, shortness of breath and wheezing.   Cardiovascular: Negative for chest pain and palpitations.       Swelling of the left breast.  Gastrointestinal: Negative for abdominal pain, constipation, diarrhea, nausea and vomiting.  Musculoskeletal: Negative for back pain and myalgias.       Swelling and pain of the left upper extremity. Left shoulder pain.  Neurological: Negative for dizziness, light-headedness and headaches.    Objective:   Physical Exam:  BP (!) 148/83 (BP Location: Left Arm, Patient Position: Sitting) Comment: Notified Nurse of BP  Pulse 78   Temp 98.3 F (36.8 C) (Oral)   Resp 18   Ht 5\' 6"  (1.676 m)   Wt 197 lb 9.6 oz (89.6 kg)   SpO2 97%   BMI 31.89 kg/m  ECOG: 0  Physical Exam Constitutional:      General: She is not in acute distress.    Appearance: She is not diaphoretic.  HENT:     Head: Normocephalic and atraumatic.  Neck:     Comments: Mild  swelling of the left neck. Cardiovascular:     Rate and Rhythm: Normal rate and regular rhythm.     Heart sounds: Normal heart sounds. No murmur. No friction rub. No gallop.   Pulmonary:     Effort: Pulmonary effort is normal. No respiratory distress.     Breath sounds:  Normal breath sounds. No wheezing or rales.  Musculoskeletal:        General: Swelling (Mild swelling of the left upper extremity.) present.  Skin:    General: Skin is warm and dry.     Findings: No erythema or rash.     Comments: No swelling noted in the left breast.  A PowerPort is noted in the left superior breast.  Neurological:     Mental Status: She is alert.     Gait: Gait normal.     Lab Review:     Component Value Date/Time   NA 141 01/12/2019 1330   NA 141 01/01/2017 0841   K 4.4 01/12/2019 1330   K 3.7 01/01/2017 0841   CL 105 01/12/2019 1330   CL 99 11/05/2012 0842   CO2 26 01/12/2019 1330   CO2 25 01/01/2017 0841   GLUCOSE 228 (H) 01/12/2019 1330   GLUCOSE 106 01/01/2017 0841   GLUCOSE 167 (H) 11/05/2012 0842   BUN 10 01/12/2019 1330   BUN 18.2 01/01/2017 0841   CREATININE 1.24 (H) 01/12/2019 1330   CREATININE 1.3 (H) 01/01/2017 0841   CALCIUM 9.4 01/12/2019 1330   CALCIUM 9.5 01/01/2017 0841   PROT 7.8 01/12/2019 1330   PROT 7.5 01/01/2017 0841   ALBUMIN 4.0 01/12/2019 1330   ALBUMIN 4.0 01/01/2017 0841   AST 12 (L) 01/12/2019 1330   AST 13 01/01/2017 0841   ALT 16 01/12/2019 1330   ALT 16 01/01/2017 0841   ALKPHOS 100 01/12/2019 1330   ALKPHOS 89 01/01/2017 0841   BILITOT 0.3 01/12/2019 1330   BILITOT 0.29 01/01/2017 0841   GFRNONAA 45 (L) 01/12/2019 1330   GFRAA 52 (L) 01/12/2019 1330       Component Value Date/Time   WBC 4.8 01/12/2019 1330   WBC 4.6 02/06/2018 0946   RBC 5.17 (H) 01/12/2019 1330   HGB 13.3 01/12/2019 1330   HGB 12.6 01/01/2017 0841   HCT 43.3 01/12/2019 1330   HCT 39.2 01/01/2017 0841   PLT 294 01/12/2019 1330   PLT 301 01/01/2017 0841   MCV 83.8  01/12/2019 1330   MCV 78.7 (L) 01/01/2017 0841   MCH 25.7 (L) 01/12/2019 1330   MCHC 30.7 01/12/2019 1330   RDW 17.2 (H) 01/12/2019 1330   RDW 17.9 (H) 01/01/2017 0841   LYMPHSABS 1.5 01/12/2019 1330   LYMPHSABS 1.3 01/01/2017 0841   MONOABS 0.4 01/12/2019 1330   MONOABS 0.5 01/01/2017 0841   EOSABS 0.1 01/12/2019 1330   EOSABS 0.1 01/01/2017 0841   BASOSABS 0.0 01/12/2019 1330   BASOSABS 0.0 01/01/2017 0841   -------------------------------  Imaging from last 24 hours (if applicable):  Radiology interpretation: Vas [place] Upper Extremity Venous Duplex  Result Date: 01/12/2019 UPPER VENOUS STUDY  Indications: Pain Performing Technologist: Oliver Hum RVT  Examination Guidelines: A complete evaluation includes B-mode imaging, spectral Doppler, color Doppler, and power Doppler as needed of all accessible portions of each vessel. Bilateral testing is considered an integral part of a complete examination. Limited examinations for reoccurring indications may be performed as noted.  Right Findings: +----------+------------+----------+---------+-----------+-------+ RIGHT     CompressiblePropertiesPhasicitySpontaneousSummary +----------+------------+----------+---------+-----------+-------+ Subclavian    Full                 Yes       Yes            +----------+------------+----------+---------+-----------+-------+  Left Findings: +----------+------------+----------+---------+-----------+-------+ LEFT      CompressiblePropertiesPhasicitySpontaneousSummary +----------+------------+----------+---------+-----------+-------+ IJV  Full                 Yes       Yes            +----------+------------+----------+---------+-----------+-------+ Subclavian    Full                 Yes       Yes            +----------+------------+----------+---------+-----------+-------+ Axillary      Full                 Yes       Yes             +----------+------------+----------+---------+-----------+-------+ Brachial      Full                 Yes       Yes            +----------+------------+----------+---------+-----------+-------+ Radial        Full                                          +----------+------------+----------+---------+-----------+-------+ Ulnar         Full                                          +----------+------------+----------+---------+-----------+-------+ Cephalic      Full                                          +----------+------------+----------+---------+-----------+-------+ Basilic       Full                                          +----------+------------+----------+---------+-----------+-------+ Left port tubing appears occluded.  Summary:  Right: No evidence of thrombosis in the subclavian.  Left: No evidence of deep vein thrombosis in the upper extremity. No evidence of superficial vein thrombosis in the upper extremity.  *See table(s) above for measurements and observations.    Preliminary

## 2019-01-12 NOTE — Progress Notes (Signed)
Pt seen by PA Van only, no RN assessment at this time.  PA aware. 

## 2019-01-12 NOTE — Telephone Encounter (Signed)
Patient called to confirm she would like to see [name] today. She will be coming in today at 1pm. Patient understands the port removal will not be able to be completed today and will be scheduled

## 2019-01-14 ENCOUNTER — Encounter (HOSPITAL_COMMUNITY): Payer: PPO

## 2019-01-20 DIAGNOSIS — I10 Essential (primary) hypertension: Secondary | ICD-10-CM | POA: Diagnosis not present

## 2019-01-20 DIAGNOSIS — C20 Malignant neoplasm of rectum: Secondary | ICD-10-CM | POA: Diagnosis not present

## 2019-01-20 DIAGNOSIS — E1100 Type 2 diabetes mellitus with hyperosmolarity without nonketotic hyperglycemic-hyperosmolar coma (NKHHC): Secondary | ICD-10-CM | POA: Diagnosis not present

## 2019-01-20 DIAGNOSIS — N189 Chronic kidney disease, unspecified: Secondary | ICD-10-CM | POA: Diagnosis not present

## 2019-01-28 ENCOUNTER — Other Ambulatory Visit: Payer: Self-pay | Admitting: Surgery

## 2019-01-28 DIAGNOSIS — Z95828 Presence of other vascular implants and grafts: Secondary | ICD-10-CM | POA: Diagnosis not present

## 2019-02-02 ENCOUNTER — Encounter (HOSPITAL_COMMUNITY): Payer: Self-pay

## 2019-02-02 NOTE — Therapy (Signed)
[facility] [place], [place], [address] Phone: [phone]   Fax:  [phone]  Patient Details  Name: Tonya Vincent MRN: [id number] Date of Birth: 06/27/1951 Referring Provider:  No ref. provider found  Encounter Date: 02/02/2019  OCCUPATIONAL THERAPY DISCHARGE SUMMARY  Visits from Start of Care: 2  Current functional level related to goals / functional outcomes: OT SHORT TERM GOAL #1    Title  Patient will be educated and independent with HEP in order to faciliate progress in therapy and allow her to use her LUE with increased comfort.    Time  4    Period  Weeks    Status  On-going        OT SHORT TERM GOAL #2   Title  Patient will increase her LUE strength to 4+/5 overall in order to return to completing normal household lifting tasks with less difficulty.     Time  4    Period  Weeks    Status  On-going        OT SHORT TERM GOAL #3   Title  Patient will decrease fascial restrictions to trace amount in her LUE in order to increase functional mobility  needed to complete reaching tasks.     Time  4    Period  Weeks    Status  On-going        OT SHORT TERM GOAL #4   Title  Patient will report a decrease in pain while using her LUE for daily tasks of approximately 2/10 or less.     Time  4    Period  Weeks    Status  On-going       Remaining deficits: Patient did not return to clinic after 2nd treatment session. Unknown about remaining deficits.    Education / Equipment: Shoulder HEP Plan: Patient agrees to discharge.  Patient goals were not met. Patient is being discharged due to not returning since the last visit.  ?????           Ailene Ravel, OTR/L,CBIS  [phone]  02/02/2019, 4:37 PM  [facility] [address] [place], [place], [address] Phone: [phone]   Fax:  [phone]

## 2019-02-03 ENCOUNTER — Telehealth: Payer: Self-pay

## 2019-02-03 ENCOUNTER — Other Ambulatory Visit: Payer: Self-pay

## 2019-02-03 DIAGNOSIS — Z85048 Personal history of other malignant neoplasm of rectum, rectosigmoid junction, and anus: Secondary | ICD-10-CM

## 2019-02-03 NOTE — Telephone Encounter (Signed)
She called and left a message regarding appt tomorrow. She had labs recently and would like to cancel lab appt for tomorrow. She is afraid insurance will not pay.

## 2019-02-03 NOTE — Telephone Encounter (Signed)
Called back. Per Dr. Alvy Bimler, she still needs appt tomorrow for CEA level only. She verbalized understanding.

## 2019-02-04 ENCOUNTER — Encounter: Payer: Self-pay | Admitting: Hematology and Oncology

## 2019-02-04 ENCOUNTER — Telehealth: Payer: Self-pay

## 2019-02-04 ENCOUNTER — Inpatient Hospital Stay (HOSPITAL_BASED_OUTPATIENT_CLINIC_OR_DEPARTMENT_OTHER): Payer: PPO | Admitting: Hematology and Oncology

## 2019-02-04 ENCOUNTER — Inpatient Hospital Stay: Payer: PPO | Attending: Medical

## 2019-02-04 DIAGNOSIS — Z85048 Personal history of other malignant neoplasm of rectum, rectosigmoid junction, and anus: Secondary | ICD-10-CM

## 2019-02-04 DIAGNOSIS — I10 Essential (primary) hypertension: Secondary | ICD-10-CM

## 2019-02-04 DIAGNOSIS — Z79899 Other long term (current) drug therapy: Secondary | ICD-10-CM | POA: Diagnosis not present

## 2019-02-04 DIAGNOSIS — Z9221 Personal history of antineoplastic chemotherapy: Secondary | ICD-10-CM | POA: Diagnosis not present

## 2019-02-04 DIAGNOSIS — Z923 Personal history of irradiation: Secondary | ICD-10-CM | POA: Diagnosis not present

## 2019-02-04 DIAGNOSIS — Z791 Long term (current) use of non-steroidal anti-inflammatories (NSAID): Secondary | ICD-10-CM | POA: Diagnosis not present

## 2019-02-04 DIAGNOSIS — Z7982 Long term (current) use of aspirin: Secondary | ICD-10-CM | POA: Insufficient documentation

## 2019-02-04 DIAGNOSIS — M79602 Pain in left arm: Secondary | ICD-10-CM | POA: Insufficient documentation

## 2019-02-04 LAB — CEA (IN HOUSE-CHCC): CEA (CHCC-IN HOUSE): 1.76 ng/mL (ref 0.00–5.00)

## 2019-02-04 NOTE — Assessment & Plan Note (Signed)
Clinically, she has no signs of cancer recurrence.  She is a long-term cancer survivor Her last colonoscopy was in 2017. Next due this year due to presence of polyps Per guidelines, there is no benefit for further surveillance imaging in the future. We discussed about discontinuation of follow-up here.  She is comfortable with the plan and just follow with primary care doctor for further management

## 2019-02-04 NOTE — Telephone Encounter (Signed)
Called and given below message. She verbalized understanding. 

## 2019-02-04 NOTE — Progress Notes (Signed)
[place] OFFICE PROGRESS NOTE  Patient Care Team: Tonya Lei, MD as PCP - General (Family Medicine) Tonya Lark, MD as Consulting Physician (Hematology and Oncology)  ASSESSMENT & PLAN:  History of rectal cancer Clinically, she has no signs of cancer recurrence.  She is a long-term cancer survivor Her last colonoscopy was in 2017. Next due this year due to presence of polyps Per guidelines, there is no benefit for further surveillance imaging in the future. We discussed about discontinuation of follow-up here.  She is comfortable with the plan and just follow with primary care doctor for further management  Essential hypertension she will continue current medical management. I recommend close follow-up with primary care doctor for medication adjustment.  Left arm pain She has left arm pain secondary to presence of a port She is scheduled for port removal next week.   No orders of the defined types were placed in this encounter.   INTERVAL HISTORY: Please see below for problem oriented charting. She returns for further follow-up Denies abdominal pain, changes in bowel habits or rectal bleeding. She complained of intermittent left arm discomfort and was evaluated recently with plan to have her port removed next week  SUMMARY OF ONCOLOGIC HISTORY: Oncology History   Malignant neoplasm of rectum, cT3N0M0 down staged to ypT2N0M0 after neoadjuvant chemotherapy and radiation therapy   Primary site: Colon and Rectum   Staging method: AJCC 7th Edition   Clinical: Stage I (T2, N0, M0) signed by Tonya Lark, MD on 12/28/2013  1:49 PM   Pathologic: Stage I (T2, N0, cM0) signed by Tonya Lark, MD on 12/28/2013  1:49 PM   Summary: Stage I (T2, N0, cM0)       History of rectal cancer   11/17/2009 Procedure    Colonoscopy and rectal biopsy confirmed moderately differentiated adenocarcinoma    11/18/2009 Imaging    Transrectal ultrasound place staging T3 lesion     11/18/2009 Imaging    Staging CT scan of the chest, abdomen and pelvis showed multiple lesions in the liver as well as paraspinal mass of unknown etiology    12/05/2009 - 04/03/2010 Chemotherapy    The patient completed new adjuvant chemotherapy with 5-FU and radiation therapy    12/14/2009 Imaging    PET CT scan show faint metabolic activity in the paraspinal mass with no activity in the liver    04/24/2010 Surgery    She had surgical resection with negative margins. Final pathology was T2, N0, M0 (down staged by chemoradiation therapy from T3, N0, M0)    11/19/2012 Imaging    MRI of the liver confirmed benign hemangioma    01/08/2014 Procedure    Colonoscopy and biopsy was negative    12/21/2014 Imaging    CT scan showed no recurrence of colon cancer. Incidentally, paraspinal mass is slightly larger.    10/04/2016 Procedure    She had repeat colonoscopy which showed two 3 mm polyps in the transverse colon, removed with a cold snare. Resected and retrieved. The examination was otherwise normal on direct and retroflexion views.    10/04/2016 Pathology Results    Surgical [P], transverse, polyp(s) - SERRATED POLYP WITH FOCAL FEATURES SUGGESTIVE OF SESSILE SERRATED POLYP/ADENOMA, ONE FRAGMENT. - HYPERPLASTIC POLYP WITHOUT DYSPLASIA, ONE FRAGMENT. - BENIGN COLORECTAL MUCOSA WITHOUT DYSPLASIA, ONE FRAGMENT. - SEE COMMENT. Microscopic Comment After routine specimen processing, three fragments are identified. One of the fragments demonstrates a serrated horizontally situated crypt, which is suggestive of superficial sampling of a sessile serrated  polyp/adenoma (the differential includes another fragment of hyperplastic polyp). Although this is the case, the finding is very focal and not definitive.      REVIEW OF SYSTEMS:   Constitutional: Denies fevers, chills or abnormal weight loss Eyes: Denies blurriness of vision Ears, nose, mouth, throat, and face: Denies mucositis or sore  throat Respiratory: Denies cough, dyspnea or wheezes Cardiovascular: Denies palpitation, chest discomfort or lower extremity swelling Gastrointestinal:  Denies nausea, heartburn or change in bowel habits Skin: Denies abnormal skin rashes Lymphatics: Denies new lymphadenopathy or easy bruising Neurological:Denies numbness, tingling or new weaknesses Behavioral/Psych: Mood is stable, no new changes  All other systems were reviewed with the patient and are negative.  I have reviewed the past medical history, past surgical history, social history and family history with the patient and they are unchanged from previous note.  ALLERGIES:  is allergic to antibacterial hand soap [triclosan] and norgesic [orphenadrine-aspirin-caffeine].  MEDICATIONS:  Current Outpatient Medications  Medication Sig Dispense Refill  . amLODipine (NORVASC) 10 MG tablet Take 10 mg by mouth at bedtime.     Marland Kitchen aspirin EC 81 MG tablet Take 81 mg by mouth at bedtime.     . docusate sodium (COLACE) 100 MG capsule Take 100-200 mg by mouth daily as needed for mild constipation.    . empagliflozin (JARDIANCE) 25 MG TABS tablet Take 25 mg by mouth daily with supper.     Vanessa Kick Ethyl (VASCEPA) 1 g CAPS Take 1 g by mouth daily with supper. Vascepa    . latanoprost (XALATAN) 0.005 % ophthalmic solution Place 1 drop into both eyes at bedtime.     . meloxicam (MOBIC) 7.5 MG tablet Take 7.5 mg by mouth 2 (two) times daily as needed for pain.     . Menthol, Topical Analgesic, 16 % LIQD Apply 1 application topically 2 (two) times daily as needed (pain).    . methylphenidate 36 MG PO CR tablet Take 36 mg by mouth daily with breakfast.    . mirabegron ER (MYRBETRIQ) 25 MG TB24 tablet Take 25 mg by mouth daily.    Marland Kitchen olmesartan (BENICAR) 40 MG tablet Take 40 mg by mouth daily.    Marland Kitchen Propylene Glycol 0.6 % SOLN Place 1 drop into both eyes 3 (three) times daily.     . TRULICITY 1.5 UL/8.4TX SOPN Inject 1.5 mg into the skin every 7  (seven) days. Friday    . vortioxetine HBr (TRINTELLIX) 10 MG TABS tablet Take 10 mg by mouth daily with supper.      No current facility-administered medications for this visit.     PHYSICAL EXAMINATION: ECOG PERFORMANCE STATUS: 0 - Asymptomatic  Vitals:   02/04/19 0914  BP: (!) 159/87  Pulse: 83  Resp: 17  Temp: 98.5 F (36.9 C)  SpO2: 98%   Filed Weights   02/04/19 0914  Weight: 196 lb (88.9 kg)    GENERAL:alert, no distress and comfortable SKIN: skin color, texture, turgor are normal, no rashes or significant lesions EYES: normal, Conjunctiva are pink and non-injected, sclera clear OROPHARYNX:no exudate, no erythema and lips, buccal mucosa, and tongue normal  NECK: supple, thyroid normal size, non-tender, without nodularity LYMPH:  no palpable lymphadenopathy in the cervical, axillary or inguinal LUNGS: clear to auscultation and percussion with normal breathing effort HEART: regular rate & rhythm and no murmurs and no lower extremity edema ABDOMEN:abdomen soft, non-tender and normal bowel sounds Musculoskeletal:no cyanosis of digits and no clubbing  NEURO: alert & oriented x 3 with  fluent speech, no focal motor/sensory deficits  LABORATORY DATA:  I have reviewed the data as listed    Component Value Date/Time   NA 141 01/12/2019 1330   NA 141 01/01/2017 0841   K 4.4 01/12/2019 1330   K 3.7 01/01/2017 0841   CL 105 01/12/2019 1330   CL 99 11/05/2012 0842   CO2 26 01/12/2019 1330   CO2 25 01/01/2017 0841   GLUCOSE 228 (H) 01/12/2019 1330   GLUCOSE 106 01/01/2017 0841   GLUCOSE 167 (H) 11/05/2012 0842   BUN 10 01/12/2019 1330   BUN 18.2 01/01/2017 0841   CREATININE 1.24 (H) 01/12/2019 1330   CREATININE 1.3 (H) 01/01/2017 0841   CALCIUM 9.4 01/12/2019 1330   CALCIUM 9.5 01/01/2017 0841   PROT 7.8 01/12/2019 1330   PROT 7.5 01/01/2017 0841   ALBUMIN 4.0 01/12/2019 1330   ALBUMIN 4.0 01/01/2017 0841   AST 12 (L) 01/12/2019 1330   AST 13 01/01/2017 0841    ALT 16 01/12/2019 1330   ALT 16 01/01/2017 0841   ALKPHOS 100 01/12/2019 1330   ALKPHOS 89 01/01/2017 0841   BILITOT 0.3 01/12/2019 1330   BILITOT 0.29 01/01/2017 0841   GFRNONAA 45 (L) 01/12/2019 1330   GFRAA 52 (L) 01/12/2019 1330    No results found for: SPEP, UPEP  Lab Results  Component Value Date   WBC 4.8 01/12/2019   NEUTROABS 2.7 01/12/2019   HGB 13.3 01/12/2019   HCT 43.3 01/12/2019   MCV 83.8 01/12/2019   PLT 294 01/12/2019      Chemistry      Component Value Date/Time   NA 141 01/12/2019 1330   NA 141 01/01/2017 0841   K 4.4 01/12/2019 1330   K 3.7 01/01/2017 0841   CL 105 01/12/2019 1330   CL 99 11/05/2012 0842   CO2 26 01/12/2019 1330   CO2 25 01/01/2017 0841   BUN 10 01/12/2019 1330   BUN 18.2 01/01/2017 0841   CREATININE 1.24 (H) 01/12/2019 1330   CREATININE 1.3 (H) 01/01/2017 0841   GLU 92 10/12/2017      Component Value Date/Time   CALCIUM 9.4 01/12/2019 1330   CALCIUM 9.5 01/01/2017 0841   ALKPHOS 100 01/12/2019 1330   ALKPHOS 89 01/01/2017 0841   AST 12 (L) 01/12/2019 1330   AST 13 01/01/2017 0841   ALT 16 01/12/2019 1330   ALT 16 01/01/2017 0841   BILITOT 0.3 01/12/2019 1330   BILITOT 0.29 01/01/2017 0841       RADIOGRAPHIC STUDIES: I have personally reviewed the radiological images as listed and agreed with the findings in the report. Vas [place] Upper Extremity Venous Duplex  Result Date: 01/12/2019 UPPER VENOUS STUDY  Indications: Pain Performing Technologist: Oliver Hum RVT  Examination Guidelines: A complete evaluation includes B-mode imaging, spectral Doppler, color Doppler, and power Doppler as needed of all accessible portions of each vessel. Bilateral testing is considered an integral part of a complete examination. Limited examinations for reoccurring indications may be performed as noted.  Right Findings: +----------+------------+----------+---------+-----------+-------+ RIGHT      CompressiblePropertiesPhasicitySpontaneousSummary +----------+------------+----------+---------+-----------+-------+ Subclavian    Full                 Yes       Yes            +----------+------------+----------+---------+-----------+-------+  Left Findings: +----------+------------+----------+---------+-----------+-------+ LEFT      CompressiblePropertiesPhasicitySpontaneousSummary +----------+------------+----------+---------+-----------+-------+ IJV           Full  Yes       Yes            +----------+------------+----------+---------+-----------+-------+ Subclavian    Full                 Yes       Yes            +----------+------------+----------+---------+-----------+-------+ Axillary      Full                 Yes       Yes            +----------+------------+----------+---------+-----------+-------+ Brachial      Full                 Yes       Yes            +----------+------------+----------+---------+-----------+-------+ Radial        Full                                          +----------+------------+----------+---------+-----------+-------+ Ulnar         Full                                          +----------+------------+----------+---------+-----------+-------+ Cephalic      Full                                          +----------+------------+----------+---------+-----------+-------+ Basilic       Full                                          +----------+------------+----------+---------+-----------+-------+ Left port tubing appears occluded.  Summary:  Right: No evidence of thrombosis in the subclavian.  Left: No evidence of deep vein thrombosis in the upper extremity. No evidence of superficial vein thrombosis in the upper extremity.  *See table(s) above for measurements and observations.  Diagnosing physician: Ruta Hinds MD Electronically signed by Ruta Hinds MD on 01/12/2019 at 5:17:25 PM.     Final     All questions were answered. The patient knows to call the clinic with any problems, questions or concerns. No barriers to learning was detected.  I spent 10 minutes counseling the patient face to face. The total time spent in the appointment was 15 minutes and more than 50% was on counseling and review of test results  Tonya Lark, MD 02/04/2019 10:46 AM

## 2019-02-04 NOTE — Assessment & Plan Note (Signed)
She has left arm pain secondary to presence of a port She is scheduled for port removal next week.

## 2019-02-04 NOTE — Telephone Encounter (Signed)
-----   Message from [name], MD sent at 02/04/2019 10:33 AM EST ----- Regarding: CEA normal Pls call and let her know

## 2019-02-04 NOTE — Assessment & Plan Note (Signed)
she will continue current medical management. I recommend close follow-up with primary care doctor for medication adjustment.  

## 2019-02-09 NOTE — Pre-Procedure Instructions (Signed)
Tonya Vincent  02/09/2019      [id number] - [place], [place] - [address] AT [address] [place] [address] Phone: [phone] Fax: [phone]    Your procedure is scheduled on Tuesday, March 17th.  Report to [facility] Admitting Entrance "A" at 7:00 A.M.  Call this number if you have problems the morning of surgery:  [phone]   Remember:  Do not eat after midnight.  You may drink clear liquids until 6:00 (3 hours prior to procedure time) .  Clear liquids allowed are: Water, Juice (non-citric and without pulp), Carbonated beverages, Clear Tea, Black Coffee only and Gatorade    Take these medicines the morning of surgery with A SIP OF WATER  mirabegron ER (MYRBETRIQ) Eye drops as needed  Follow your surgeon's instructions on when to stop Asprin.  If no instructions were given by your surgeon then you will need to call the office to get those instructions.    7 days prior to surgery STOP taking any Aleve, Naproxen, Ibuprofen, Motrin, Advil, Goody's, BC's, all herbal medications, fish oil, and all vitamins. Includes: meloxicam (mobic).    WHAT DO I DO ABOUT MY DIABETES MEDICATION?   Marland Kitchen Do not take oral diabetes medicines (pills) the morning of surgery.   . THE NIGHT BEFORE SURGERY, Do NOT take your empagliflozin (JARDIANCE).      . The day of surgery, do not take other diabetes injectables, including Byetta (exenatide), Bydureon (exenatide ER), Victoza (liraglutide), or Trulicity (dulaglutide).   How to Manage Your Diabetes Before and After Surgery  Why is it important to control my blood sugar before and after surgery? . Improving blood sugar levels before and after surgery helps healing and can limit problems. . A way of improving blood sugar control is eating a healthy diet by: o  Eating less sugar and carbohydrates o  Increasing activity/exercise o  Talking with your doctor about  reaching your blood sugar goals . High blood sugars (greater than 180 mg/dL) can raise your risk of infections and slow your recovery, so you will need to focus on controlling your diabetes during the weeks before surgery. . Make sure that the doctor who takes care of your diabetes knows about your planned surgery including the date and location.  How do I manage my blood sugar before surgery? . Check your blood sugar at least 4 times a day, starting 2 days before surgery, to make sure that the level is not too high or low. o Check your blood sugar the morning of your surgery when you wake up and every 2 hours until you get to the Short Stay unit. . If your blood sugar is less than 70 mg/dL, you will need to treat for low blood sugar: o Do not take insulin. o Treat a low blood sugar (less than 70 mg/dL) with  cup of clear juice (cranberry or apple), 4 glucose tablets, OR glucose gel. o Recheck blood sugar in 15 minutes after treatment (to make sure it is greater than 70 mg/dL). If your blood sugar is not greater than 70 mg/dL on recheck, call [phone] for further instructions. . Report your blood sugar to the short stay nurse when you get to Short Stay.  . If you are admitted to the hospital after surgery: o Your blood sugar will be checked by the staff and you will probably be given insulin  after surgery (instead of oral diabetes medicines) to make sure you have good blood sugar levels. o The goal for blood sugar control after surgery is 80-180 mg/dL    Do not wear jewelry, make-up or nail polish.  Do not wear lotions, powders, or perfumes, or deodorant.  Do not shave 48 hours prior to surgery.    Do not bring valuables to the hospital.  [facility] is not responsible for any belongings or valuables.  Contacts, dentures or bridgework may not be worn into surgery.  Leave your suitcase in the car.  After surgery it may be brought to your room.  For patients admitted to the hospital,  discharge time will be determined by your treatment team.  Patients discharged the day of surgery will not be allowed to drive home.   Special instructions:   [place]- Preparing For Surgery  Before surgery, you can play an important role. Because skin is not sterile, your skin needs to be as free of germs as possible. You can reduce the number of germs on your skin by washing with CHG (chlorahexidine gluconate) Soap before surgery.  CHG is an antiseptic cleaner which kills germs and bonds with the skin to continue killing germs even after washing.    Oral Hygiene is also important to reduce your risk of infection.  Remember - BRUSH YOUR TEETH THE MORNING OF SURGERY WITH YOUR REGULAR TOOTHPASTE  Please do not use if you have an allergy to CHG or antibacterial soaps. If your skin becomes reddened/irritated stop using the CHG.  Do not shave (including legs and underarms) for at least 48 hours prior to first CHG shower. It is OK to shave your face.  Please follow these instructions carefully.   1. Shower the NIGHT BEFORE SURGERY and the MORNING OF SURGERY with CHG.   2. If you chose to wash your hair, wash your hair first as usual with your normal shampoo.  3. After you shampoo, rinse your hair and body thoroughly to remove the shampoo.  4. Use CHG as you would any other liquid soap. You can apply CHG directly to the skin and wash gently with a scrungie or a clean washcloth.   5. Apply the CHG Soap to your body ONLY FROM THE NECK DOWN.  Do not use on open wounds or open sores. Avoid contact with your eyes, ears, mouth and genitals (private parts). Wash Face and genitals (private parts)  with your normal soap.  6. Wash thoroughly, paying special attention to the area where your surgery will be performed.  7. Thoroughly rinse your body with warm water from the neck down.  8. DO NOT shower/wash with your normal soap after using and rinsing off the CHG Soap.  9. Pat yourself dry with a  CLEAN TOWEL.  10. Wear CLEAN PAJAMAS to bed the night before surgery, wear comfortable clothes the morning of surgery  11. Place CLEAN SHEETS on your bed the night of your first shower and DO NOT SLEEP WITH PETS.    Day of Surgery:  Do not apply any deodorants/lotions.  Please wear clean clothes to the hospital/surgery center.   Remember to brush your teeth WITH YOUR REGULAR TOOTHPASTE.    Please read over the following fact sheets that you were given.

## 2019-02-10 ENCOUNTER — Other Ambulatory Visit: Payer: Self-pay

## 2019-02-10 ENCOUNTER — Encounter (HOSPITAL_COMMUNITY)
Admission: RE | Admit: 2019-02-10 | Discharge: 2019-02-10 | Disposition: A | Discharge status: Home / Self Care | Payer: PPO | Source: Ambulatory Visit | Attending: Surgery | Admitting: Surgery

## 2019-02-10 ENCOUNTER — Encounter (HOSPITAL_COMMUNITY): Payer: Self-pay

## 2019-02-10 DIAGNOSIS — I10 Essential (primary) hypertension: Secondary | ICD-10-CM | POA: Insufficient documentation

## 2019-02-10 DIAGNOSIS — Z01818 Encounter for other preprocedural examination: Secondary | ICD-10-CM | POA: Insufficient documentation

## 2019-02-10 HISTORY — DX: Other complications of anesthesia, initial encounter: T88.59XA

## 2019-02-10 HISTORY — DX: Overactive bladder: N32.81

## 2019-02-10 HISTORY — DX: Adverse effect of unspecified anesthetic, initial encounter: T41.45XA

## 2019-02-10 LAB — BASIC METABOLIC PANEL
Anion gap: 8 (ref 5–15)
BUN: 11 mg/dL (ref 8–23)
CO2: 22 mmol/L (ref 22–32)
Calcium: 9.5 mg/dL (ref 8.9–10.3)
Chloride: 109 mmol/L (ref 98–111)
Creatinine, Ser: 1.16 mg/dL — ABNORMAL HIGH (ref 0.44–1.00)
GFR calc Af Amer: 57 mL/min — ABNORMAL LOW (ref >60)
GFR, EST NON AFRICAN AMERICAN: 49 mL/min — AB (ref >60)
Glucose, Bld: 97 mg/dL (ref 70–99)
Potassium: 4.2 mmol/L (ref 3.5–5.1)
Sodium: 139 mmol/L (ref 135–145)

## 2019-02-10 LAB — CBC
HCT: 43.1 % (ref 36.0–46.0)
Hemoglobin: 12.8 g/dL (ref 12.0–15.0)
MCH: 25.2 pg — ABNORMAL LOW (ref 26.0–34.0)
MCHC: 29.7 g/dL — ABNORMAL LOW (ref 30.0–36.0)
MCV: 84.8 fL (ref 80.0–100.0)
Platelets: 279 10*3/uL (ref 150–400)
RBC: 5.08 MIL/uL (ref 3.87–5.11)
RDW: 16.9 % — AB (ref 11.5–15.5)
WBC: 6.1 10*3/uL (ref 4.0–10.5)
nRBC: 0 % (ref 0.0–0.2)

## 2019-02-10 LAB — HEMOGLOBIN A1C
Hgb A1c MFr Bld: 7.5 % — ABNORMAL HIGH (ref 4.8–5.6)
MEAN PLASMA GLUCOSE: 168.55 mg/dL

## 2019-02-10 LAB — GLUCOSE, CAPILLARY: Glucose-Capillary: 90 mg/dL (ref 70–99)

## 2019-02-10 NOTE — Anesthesia Preprocedure Evaluation (Addendum)
Anesthesia Evaluation    Airway Mallampati: II  TM Distance: >3 FB Neck ROM: Full    Dental no notable dental hx. (+) Dental Advisory Given, Poor Dentition   Pulmonary former smoker,    Pulmonary exam normal breath sounds clear to auscultation       Cardiovascular hypertension, Pt. on medications Normal cardiovascular exam Rhythm:Regular Rate:Normal     Neuro/Psych Anxiety Depression    GI/Hepatic   Endo/Other  diabetes, Type 2  Renal/GU      Musculoskeletal   Abdominal (+) + obese,   Peds  Hematology  (+) anemia ,   Anesthesia Other Findings   Reproductive/Obstetrics                           Anesthesia Physical Anesthesia Plan  ASA: III  Anesthesia Plan: MAC   Post-op Pain Management:    Induction: Intravenous  PONV Risk Score and Plan: 3 and Ondansetron, Dexamethasone and Midazolam  Airway Management Planned: Natural Airway and Simple Face Mask  Additional Equipment:   Intra-op Plan:   Post-operative Plan: Extubation in OR  Informed Consent: I have reviewed the patients History and Physical, chart, labs and discussed the procedure including the risks, benefits and alternatives for the proposed anesthesia with the patient or authorized representative who has indicated his/her understanding and acceptance.     Dental advisory given  Plan Discussed with: CRNA  Anesthesia Plan Comments: (PAT note written 02/10/2019 by Myra Gianotti, PA-C. )      Anesthesia Quick Evaluation

## 2019-02-10 NOTE — Progress Notes (Signed)
PCP - [name] Cardiologist - denies  Chest x-ray - N/A EKG - 02/10/19 Stress Test - 5+ years; pt unsure of when and where. ECHO - denies Cardiac Cath - denies  Sleep Study - denies CPAP - denies  Does not check CBG at home nor has the materials to do so.   Blood Thinner Instructions:N/A Aspirin Instructions:Pt didn't receive instructions; will contact Dr. Trevor Mace office for instructions.   Anesthesia review: Yes, hx of murmur with no follow up. C/O musculoskeletal pain near left chest area from behind port placement. Ebony Hail, [place] saw pt in PAT appointment.    Patient denies shortness of breath, fever, cough and chest pain at PAT appointment   Patient verbalized understanding of instructions that were given to them at the PAT appointment. Patient was also instructed that they will need to review over the PAT instructions again at home before surgery.

## 2019-02-10 NOTE — Progress Notes (Signed)
Anesthesia PAT Evaluation:  Case:  [id number] Date/Time:  02/17/19 0715   Procedure:  REMOVAL PORT-A-CATH (N/A )   Anesthesia type:  General   Pre-op diagnosis:  port no longer needed   Location:  [facility] ROOM 09 / [place] [facility]   Surgeon:  Coralie Keens, MD      DISCUSSION: Patient is a 68 year old female scheduled for the above procedure.  History includes former smoker (quit 2011), HTN, anemia, murmur, rectal cancer (s/p low anterior resection 04/24/10), DM2, paraspinal mass 12/29/14 (see below), left subclavian Port-a-cath 12/23/09. BMI is consistent with obesity.  - She was referred to CT surgeon Lanelle Bal, MD on 12/30/14 for gradual increase in size of right upper thoracic paraspinal lesion (2.7 cm 2011, 3.5 cm 12/21/14) that was favored to be a benign indolent process such as nerve sheath tumor. He discussed surgical option of thoracoscopic removal verus continued surveillance. She opted for surveillance, although does not appear that she has had it re-imaged. (I discussed with her potential to develop symptoms if increases in size over time. She denied hearing loss, facial numbness/tingling.)  She reports being told she had a murmur years ago, but denied known echo. She has had localized intermittent "catching" pain in her left chest (about 2 inches above and to the left of her Port-a-cath). Chest wall does have tenderness and pain seems to be positional. She reports some intermittent SOB over the past few years, possibly a little worse over the past three months, but more notable when she lies on her left side. 01/12/19 LUE venous US was negative for DVT. She denied edema. She is still able to walk her dog for 30-60 minutes, swim, play billiards and shuffleboard without chest pain or significant SOB.   EKG is stable since 2011. I did not hear a murmur on exam.  A1c is < 8%. No exertional chest pain symptoms. Activity not limited by dyspnea. Advised on-going follow-up with her PCP, but if no acute  changes I would anticipate that she can proceed with Port-a-cath removal. Reviewed chart with anesthesiologist Ellender, Thurmond Butts, MD.   VS: BP (!) 148/88   Pulse 99   Temp 36.8 C   Resp 18   Ht 5\' 3"  (1.6 m)   Wt 88.2 kg   SpO2 97%   BMI 34.44 kg/m  Patient is a pleasant black female in NAD. No conversational dyspnea. Heart RRR, no murmur noted. Lungs clear. No carotid bruits noted. Left chest wall Port-a-cath.   PROVIDERS: [name], MD is PCP  [name], MD is HEM-ONC. PRN oncology follow-up recommended since no signs of cancer recurrence.   LABS: Labs reviewed: Acceptable for surgery. A1c 7.5. She does not check home CBGs. (all labs ordered are listed, but only abnormal results are displayed)  Labs Reviewed  HEMOGLOBIN A1C - Abnormal; Notable for the following components:      Result Value   Hgb A1c MFr Bld 7.5 (*)    All other components within normal limits  BASIC METABOLIC PANEL - Abnormal; Notable for the following components:   Creatinine, Ser 1.16 (*)    GFR calc non Af Amer 49 (*)    GFR calc Af Amer 57 (*)    All other components within normal limits  CBC - Abnormal; Notable for the following components:   MCH 25.2 (*)    MCHC 29.7 (*)    RDW 16.9 (*)    All other components within normal limits  GLUCOSE, CAPILLARY  IMAGES: CT chest/abd/pelvis 12/21/14: IMPRESSION: 1. No specific features identified to suggest residual or recurrence of tumor within the chest, abdomen or pelvis. 2. Stable enhancing liver lesions, previously described as representing benign lesions. 3. Small nonspecific pulmonary nodules are unchanged from previous exam 4. Gradual increase in size of right upper thoracic paraspinal lesion when compared with remote study from 09/04/2010. This is favored to represent a benign indolent process such as a nerve sheath tumor.   EKG: 02/10/19: NSR. Inferior infarct (age undetermined). EKG appears stable when compared to 12/22/09 tracing.     CV: LUE venous Duplex 01/12/19: Summary: - Right: No evidence of thrombosis in the subclavian. - Left: No evidence of deep vein thrombosis in the upper extremity. No evidence of superficial vein thrombosis in the upper extremity.   She believes she may have had a stress test many years ago.   Past Medical History:  Diagnosis Date  . Anemia   . Anxiety   . Depression   . Diabetes mellitus    type 2 DM  . Heart murmur   . History of rectal cancer 10/04/2011  . Hypertension   . Malignant neoplasm of rectum ([place]) 10/04/2011  . 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 rectal ca` dx'd 11/16/09   xrt comp 01/2010; chemo comp 01/2011  . Tubular adenoma of colon [id number]    Past Surgical History:  Procedure Laterality Date  . ABDOMINAL HYSTERECTOMY     for uterine fibroids  . COLON SURGERY N/A 04/24/2010   "took out half rectum"; rectal cancer  . HAND SURGERY     carpal tunnel- left  . [place]  . PORTACATH PLACEMENT     2011    MEDICATIONS: . amLODipine (NORVASC) 10 MG tablet  . aspirin EC 81 MG tablet  . docusate sodium (COLACE) 100 MG capsule  . empagliflozin (JARDIANCE) 25 MG TABS tablet  . Icosapent Ethyl (VASCEPA) 1 g CAPS  . latanoprost (XALATAN) 0.005 % ophthalmic solution  . meloxicam (MOBIC) 7.5 MG tablet  . Menthol, Topical Analgesic, 16 % LIQD  . methylphenidate 36 MG PO CR tablet  . mirabegron ER (MYRBETRIQ) 25 MG TB24 tablet  . olmesartan (BENICAR) 40 MG tablet  . Propylene Glycol 0.6 % SOLN  . TRULICITY 1.5 AJ/6.8TL SOPN  . vortioxetine HBr (TRINTELLIX) 10 MG TABS tablet   No current facility-administered medications for this encounter.   She was advised to follow surgeon's instructions regarding ASA preoperatively.  Myra Gianotti, PA-C Surgical Short Stay/Anesthesiology [facility] Phone [phone] [facility] Phone [phone] 02/10/2019 5:30 PM

## 2019-02-16 NOTE — H&P (Signed)
Tonya Vincent Documented: 01/28/2019 10:11 AM Location: [facility] Patient #: [id number] DOB: [birth year] Single / Language: Tonya Vincent / Race: Black or African American Female   History of Present Illness (Tonya Vincent A. Ninfa Linden MD; 01/28/2019 10:18 AM) The patient is a 68 year old female who presents for a pre-op visit. This is a previous patient of Dr. Richarda Blade. She underwent a low anterior resection in 2011 for rectal cancer. She had a Port-A-Cath placed at that time as well. She has been disease-free and the port has not actually been used for flushed and about 6 years. She has been having increasing shoulder discomfort. A vascular workup is unremarkable. She is requesting removal of the Port-A-Cath. She is otherwise without complaints. She is not on any anticoagulation.   Past Surgical History Tonya Vincent, CMA; 01/28/2019 10:11 AM) Colon Polyp Removal - Colonoscopy  Hemorrhoidectomy  Hysterectomy (not due to cancer) - Complete  Oral Surgery  Resection of Small Bowel   Diagnostic Studies History Tonya Vincent, CMA; 01/28/2019 10:11 AM) Colonoscopy  1-5 years ago Mammogram  within last year Pap Smear  >5 years ago  Allergies Tonya Vincent, CMA; 01/28/2019 10:12 AM) Norgesic *MUSCULOSKELETAL THERAPY AGENTS*   Medication History Tonya Vincent, CMA; 01/28/2019 10:14 AM) Olmesartan Medoxomil (40MG  Tablet, Oral) Active. amLODIPine Besylate (10MG  Tablet, Oral) Active. Spironolactone (25MG  Tablet, Oral) Active. Jardiance (25MG  Tablet, Oral) Active. Trulicity (1.5MG /0.5ML Soln Pen-inj, Subcutaneous) Active. Myrbetriq (25MG  Tablet ER 24HR, Oral) Active. Trintellix (10MG  Tablet, Oral) Active. Methylphenidate HCl ER (36MG  Tablet ER, Oral) Active. Aspirin (81MG  Tablet, Oral) Active. Docusate Sodium (100MG  Tablet, Oral) Active. Meloxicam (7.5MG  Tablet, Oral) Active. Latanoprost (0.005% Solution, Ophthalmic) Active. Vascepa (1GM Capsule, Oral)  Active. MiraLax (Oral) Active. Medications Reconciled  Social History Tonya Vincent, CMA; 01/28/2019 10:11 AM) Alcohol use  Moderate alcohol use. Caffeine use  Carbonated beverages, Coffee, Tea. No drug use  Tobacco use  Former smoker.  Family History Tonya Vincent, CMA; 01/28/2019 10:11 AM) Diabetes Mellitus  Mother. Heart Disease  Father, Mother. Hypertension  Mother. Kidney Disease  Mother.  Pregnancy / Birth History Tonya Vincent, CMA; 01/28/2019 10:11 AM) Age at menarche  20 years. Age of men[age]  [age] Contraceptive History  Intrauterine device, Oral contraceptives. Gravida  0 Para  0  Other Problems Tonya Vincent, CMA; 01/28/2019 10:11 AM) Back Pain  Cancer  Chest pain  Depression  Diabetes Mellitus  Heart murmur  Hemorrhoids  High blood pressure  Hypercholesterolemia  Oophorectomy     Review of Systems (Tonya Vincent CMA; 01/28/2019 10:11 AM) General Present- Fatigue and Night Sweats. Not Present- Appetite Loss, Chills, Fever, Weight Gain and Weight Loss. Skin Present- Dryness and Non-Healing Wounds. Not Present- Change in Wart/Mole, Hives, Jaundice, New Lesions, Rash and Ulcer. HEENT Present- Wears glasses/contact lenses. Not Present- Earache, Hearing Loss, Hoarseness, Nose Bleed, Oral Ulcers, Ringing in the Ears, Seasonal Allergies, Sinus Pain, Sore Throat, Visual Disturbances and Yellow Eyes. Cardiovascular Present- Shortness of Breath. Not Present- Chest Pain, Difficulty Breathing Lying Down, Leg Cramps, Palpitations, Rapid Heart Rate and Swelling of Extremities. Gastrointestinal Present- Bloating, Change in Bowel Habits, Excessive gas, Gets full quickly at meals, Hemorrhoids and Rectal Pain. Not Present- Abdominal Pain, Bloody Stool, Chronic diarrhea, Constipation, Difficulty Swallowing, Indigestion, Nausea and Vomiting. Female Genitourinary Present- Frequency and Urgency. Not Present- Nocturia, Painful Urination and Pelvic  Pain. Musculoskeletal Present- Back Pain, Joint Stiffness and Muscle Pain. Not Present- Joint Pain, Muscle Weakness and Swelling of Extremities. Psychiatric Present- Depression. Not Present- Anxiety, Bipolar, Change in Sleep Pattern, Fearful and  Frequent crying. Endocrine Present- Hot flashes. Not Present- Cold Intolerance, Excessive Hunger, Hair Changes, Heat Intolerance and New Diabetes. Hematology Present- Blood Thinners and Easy Bruising. Not Present- Excessive bleeding, Gland problems, HIV and Persistent Infections.  Vitals (Tonya Vincent CMA; 01/28/2019 10:12 AM) 01/28/2019 10:12 AM Weight: 197.6 lb Height: 63in Body Surface Area: 1.92 m Body Mass Index: 35 kg/m  Pulse: 97 (Regular)  BP: 136/80 (Sitting, Left Arm, Standard)       Physical Exam (Tonya Vincent A. Ninfa Linden MD; 01/28/2019 10:18 AM) The physical exam findings are as follows: Note:She is well appearance The port site on the left chest is well healed and palpable. There is no evidence of infection Lungs clear CV RRR Skin without erythema   Assessment & Plan (Tonya Vincent A. Ninfa Linden MD; 01/28/2019 10:19 AM) Charlann Lange IN PLACE [phone]) Impression: At this point, she mostly port removed which is reasonable. I am uncertain whether this will improve her shoulder discomfort. This may be very difficult to remove given the fact that is been in place for 9 years. I discussed the risks of procedure. These include but are not limited to bleeding, infection, injury to his riding structures including the blood vessel, the need for further procedures, cardiopulmonary issues, but stop her recovery, etc. She understands and wishes to proceed with surgery

## 2019-02-17 ENCOUNTER — Other Ambulatory Visit: Payer: Self-pay

## 2019-02-17 ENCOUNTER — Ambulatory Visit (HOSPITAL_COMMUNITY): Payer: PPO | Admitting: Physician Assistant

## 2019-02-17 ENCOUNTER — Encounter (HOSPITAL_COMMUNITY): Payer: Self-pay | Admitting: General Practice

## 2019-02-17 ENCOUNTER — Encounter (HOSPITAL_COMMUNITY)
Admission: RE | Disposition: A | Discharge status: Home / Self Care | Payer: Self-pay | Source: Home / Self Care | Attending: Surgery

## 2019-02-17 ENCOUNTER — Ambulatory Visit (HOSPITAL_COMMUNITY)
Admission: RE | Admit: 2019-02-17 | Discharge: 2019-02-17 | Disposition: A | Discharge status: Home / Self Care | Payer: PPO | Attending: Surgery | Admitting: Surgery

## 2019-02-17 ENCOUNTER — Ambulatory Visit (HOSPITAL_COMMUNITY): Payer: PPO | Admitting: Anesthesiology

## 2019-02-17 DIAGNOSIS — Z452 Encounter for adjustment and management of vascular access device: Secondary | ICD-10-CM | POA: Diagnosis not present

## 2019-02-17 DIAGNOSIS — I10 Essential (primary) hypertension: Secondary | ICD-10-CM | POA: Diagnosis not present

## 2019-02-17 DIAGNOSIS — Z87891 Personal history of nicotine dependence: Secondary | ICD-10-CM | POA: Insufficient documentation

## 2019-02-17 DIAGNOSIS — E119 Type 2 diabetes mellitus without complications: Secondary | ICD-10-CM | POA: Insufficient documentation

## 2019-02-17 DIAGNOSIS — Z79899 Other long term (current) drug therapy: Secondary | ICD-10-CM | POA: Insufficient documentation

## 2019-02-17 DIAGNOSIS — Z888 Allergy status to other drugs, medicaments and biological substances status: Secondary | ICD-10-CM | POA: Insufficient documentation

## 2019-02-17 DIAGNOSIS — Z85048 Personal history of other malignant neoplasm of rectum, rectosigmoid junction, and anus: Secondary | ICD-10-CM | POA: Diagnosis not present

## 2019-02-17 DIAGNOSIS — E7800 Pure hypercholesterolemia, unspecified: Secondary | ICD-10-CM | POA: Insufficient documentation

## 2019-02-17 DIAGNOSIS — F329 Major depressive disorder, single episode, unspecified: Secondary | ICD-10-CM | POA: Diagnosis not present

## 2019-02-17 DIAGNOSIS — Z7982 Long term (current) use of aspirin: Secondary | ICD-10-CM | POA: Insufficient documentation

## 2019-02-17 DIAGNOSIS — Z7984 Long term (current) use of oral hypoglycemic drugs: Secondary | ICD-10-CM | POA: Insufficient documentation

## 2019-02-17 HISTORY — PX: PORT-A-CATH REMOVAL: SHX5289

## 2019-02-17 LAB — GLUCOSE, CAPILLARY
Glucose-Capillary: 116 mg/dL — ABNORMAL HIGH (ref 70–99)
Glucose-Capillary: 129 mg/dL — ABNORMAL HIGH (ref 70–99)

## 2019-02-17 SURGERY — REMOVAL PORT-A-CATH
Anesthesia: General | Site: Chest

## 2019-02-17 MED ORDER — DEXAMETHASONE SODIUM PHOSPHATE 10 MG/ML IJ SOLN
INTRAMUSCULAR | Status: AC
  Filled 2019-02-17: qty 1

## 2019-02-17 MED ORDER — ONDANSETRON HCL 4 MG/2ML IJ SOLN
INTRAMUSCULAR | Status: AC
  Filled 2019-02-17: qty 2

## 2019-02-17 MED ORDER — TRAMADOL HCL 50 MG PO TABS: 50.0000 mg | ORAL_TABLET | Freq: Four times a day (QID) | ORAL | 0 refills | Status: DC | PRN

## 2019-02-17 MED ORDER — PROPOFOL 500 MG/50ML IV EMUL
INTRAVENOUS | Status: DC | PRN
  Administered 2019-02-17: 75 ug/kg/min via INTRAVENOUS

## 2019-02-17 MED ORDER — DEXAMETHASONE SODIUM PHOSPHATE 10 MG/ML IJ SOLN
INTRAMUSCULAR | Status: DC | PRN
  Administered 2019-02-17: 10 mg via INTRAVENOUS

## 2019-02-17 MED ORDER — LACTATED RINGERS IV SOLN
INTRAVENOUS | Status: DC | PRN
  Administered 2019-02-17: 07:00:00 via INTRAVENOUS

## 2019-02-17 MED ORDER — LIDOCAINE 2% (20 MG/ML) 5 ML SYRINGE
INTRAMUSCULAR | Status: AC
  Filled 2019-02-17: qty 5

## 2019-02-17 MED ORDER — PROPOFOL 10 MG/ML IV BOLUS
INTRAVENOUS | Status: AC
  Filled 2019-02-17: qty 20

## 2019-02-17 MED ORDER — LIDOCAINE-EPINEPHRINE (PF) 1 %-1:200000 IJ SOLN
INTRAMUSCULAR | Status: AC
  Filled 2019-02-17: qty 30

## 2019-02-17 MED ORDER — CHLORHEXIDINE GLUCONATE CLOTH 2 % EX PADS: 6.0000 | MEDICATED_PAD | Freq: Once | CUTANEOUS | Status: DC

## 2019-02-17 MED ORDER — ONDANSETRON HCL 4 MG/2ML IJ SOLN
INTRAMUSCULAR | Status: DC | PRN
  Administered 2019-02-17: 4 mg via INTRAVENOUS

## 2019-02-17 MED ORDER — MIDAZOLAM HCL 2 MG/2ML IJ SOLN
INTRAMUSCULAR | Status: AC
  Filled 2019-02-17: qty 2

## 2019-02-17 MED ORDER — FENTANYL CITRATE (PF) 100 MCG/2ML IJ SOLN
INTRAMUSCULAR | Status: DC | PRN
  Administered 2019-02-17: 50 ug via INTRAVENOUS

## 2019-02-17 MED ORDER — MIDAZOLAM HCL 5 MG/5ML IJ SOLN
INTRAMUSCULAR | Status: DC | PRN
  Administered 2019-02-17: 2 mg via INTRAVENOUS

## 2019-02-17 MED ORDER — FENTANYL CITRATE (PF) 250 MCG/5ML IJ SOLN
INTRAMUSCULAR | Status: AC
  Filled 2019-02-17: qty 5

## 2019-02-17 MED ORDER — ACETAMINOPHEN 500 MG PO TABS
1000.0000 mg | ORAL_TABLET | ORAL | Status: AC
  Administered 2019-02-17: 1000 mg via ORAL
  Filled 2019-02-17: qty 2

## 2019-02-17 MED ORDER — CEFAZOLIN SODIUM-DEXTROSE 2-4 GM/100ML-% IV SOLN
2.0000 g | INTRAVENOUS | Status: AC
  Administered 2019-02-17: 2 g via INTRAVENOUS
  Filled 2019-02-17: qty 100

## 2019-02-17 MED ORDER — BUPIVACAINE-EPINEPHRINE (PF) 0.25% -1:200000 IJ SOLN
INTRAMUSCULAR | Status: AC
  Filled 2019-02-17: qty 30

## 2019-02-17 MED ORDER — LIDOCAINE-EPINEPHRINE (PF) 1 %-1:200000 IJ SOLN
INTRAMUSCULAR | Status: DC | PRN
  Administered 2019-02-17: 18 mL

## 2019-02-17 MED ORDER — GABAPENTIN 300 MG PO CAPS
300.0000 mg | ORAL_CAPSULE | ORAL | Status: AC
  Administered 2019-02-17: 300 mg via ORAL
  Filled 2019-02-17: qty 1

## 2019-02-17 SURGICAL SUPPLY — 36 items
ADH SKN CLS APL DERMABOND .7 (GAUZE/BANDAGES/DRESSINGS) ×1
BLADE 15 SAFETY STRL DISP (BLADE) ×2 IMPLANT
CHLORAPREP W/TINT 10.5 ML (MISCELLANEOUS) ×3 IMPLANT
COVER SURGICAL LIGHT HANDLE (MISCELLANEOUS) ×3 IMPLANT
COVER WAND RF STERILE (DRAPES) ×1 IMPLANT
DECANTER SPIKE VIAL GLASS SM (MISCELLANEOUS) ×3 IMPLANT
DERMABOND ADVANCED (GAUZE/BANDAGES/DRESSINGS) ×2
DERMABOND ADVANCED .7 DNX12 (GAUZE/BANDAGES/DRESSINGS) ×1 IMPLANT
DRAPE LAPAROTOMY 100X72 PEDS (DRAPES) ×3 IMPLANT
DRAPE UTILITY XL STRL (DRAPES) ×4 IMPLANT
DRSG TEGADERM 4X4.75 (GAUZE/BANDAGES/DRESSINGS) ×1 IMPLANT
ELECT CAUTERY BLADE 6.4 (BLADE) ×3 IMPLANT
ELECT REM PT RETURN 9FT ADLT (ELECTROSURGICAL) ×3
ELECTRODE REM PT RTRN 9FT ADLT (ELECTROSURGICAL) ×1 IMPLANT
GAUZE 4X4 16PLY RFD (DISPOSABLE) ×3 IMPLANT
GAUZE SPONGE 2X2 8PLY STRL LF (GAUZE/BANDAGES/DRESSINGS) ×1 IMPLANT
GLOVE SURG SIGNA 7.5 PF LTX (GLOVE) ×3 IMPLANT
GOWN STRL REUS W/ TWL LRG LVL3 (GOWN DISPOSABLE) ×1 IMPLANT
GOWN STRL REUS W/ TWL XL LVL3 (GOWN DISPOSABLE) ×1 IMPLANT
GOWN STRL REUS W/TWL LRG LVL3 (GOWN DISPOSABLE) ×6
GOWN STRL REUS W/TWL XL LVL3 (GOWN DISPOSABLE) ×3
KIT BASIN OR (CUSTOM PROCEDURE TRAY) ×3 IMPLANT
KIT TURNOVER KIT B (KITS) ×3 IMPLANT
NDL HYPO 25GX1X1/2 BEV (NEEDLE) ×1 IMPLANT
NEEDLE HYPO 25GX1X1/2 BEV (NEEDLE) ×3 IMPLANT
NS IRRIG 1000ML POUR BTL (IV SOLUTION) ×3 IMPLANT
PACK SURGICAL SETUP 50X90 (CUSTOM PROCEDURE TRAY) ×3 IMPLANT
PAD ARMBOARD 7.5X6 YLW CONV (MISCELLANEOUS) ×3 IMPLANT
PENCIL BUTTON HOLSTER BLD 10FT (ELECTRODE) ×3 IMPLANT
SPONGE GAUZE 2X2 STER 10/PKG (GAUZE/BANDAGES/DRESSINGS)
SUT MNCRL AB 4-0 PS2 18 (SUTURE) ×3 IMPLANT
SUT VIC AB 3-0 SH 27 (SUTURE) ×3
SUT VIC AB 3-0 SH 27X BRD (SUTURE) ×1 IMPLANT
SYR CONTROL 10ML LL (SYRINGE) ×3 IMPLANT
TOWEL OR 17X24 6PK STRL BLUE (TOWEL DISPOSABLE) ×3 IMPLANT
TOWEL OR 17X26 10 PK STRL BLUE (TOWEL DISPOSABLE) ×1 IMPLANT

## 2019-02-17 NOTE — Anesthesia Procedure Notes (Signed)
Procedure Name: MAC Date/Time: 02/17/2019 7:35 AM Performed by: Jenne Campus, CRNA Pre-anesthesia Checklist: Patient identified, Emergency Drugs available, Suction available and Patient being monitored Oxygen Delivery Method: Simple face mask

## 2019-02-17 NOTE — Interval H&P Note (Signed)
History and Physical Interval Note:no change in H and P  02/17/2019 7:04 AM  Tonya Vincent  has presented today for surgery, with the diagnosis of port no longer needed.  The various methods of treatment have been discussed with the patient and family. After consideration of risks, benefits and other options for treatment, the patient has consented to  Procedure(s): REMOVAL PORT-A-CATH (N/A) as a surgical intervention.  The patient's history has been reviewed, patient examined, no change in status, stable for surgery.  I have reviewed the patient's chart and labs.  Questions were answered to the patient's satisfaction.     Coralie Keens

## 2019-02-17 NOTE — Transfer of Care (Signed)
Immediate Anesthesia Transfer of Care Note  Patient: Tonya Vincent  Procedure(s) Performed: REMOVAL PORT-A-CATH (N/A Chest)  Patient Location: PACU  Anesthesia Type:MAC  Level of Consciousness: awake, oriented and patient cooperative  Airway & Oxygen Therapy: Patient Spontanous Breathing and Patient connected to nasal cannula oxygen  Post-op Assessment: Report given to RN and Post -op Vital signs reviewed and stable  Post vital signs: Reviewed  Last Vitals:  Vitals Value Taken Time  BP 120/61 02/17/2019  8:17 AM  Temp    Pulse 73 02/17/2019  8:17 AM  Resp 13 02/17/2019  8:17 AM  SpO2 98 % 02/17/2019  8:17 AM  Vitals shown include unvalidated device data.  Last Pain:  Vitals:   02/17/19 0644  TempSrc:   PainSc: 2       Patients Stated Pain Goal: 2 (70/78/67 5449)  Complications: No apparent anesthesia complications

## 2019-02-17 NOTE — Discharge Instructions (Signed)
It is okay to shower starting tomorrow  Ice pack, Tylenol, and ibuprofen also for pain  No vigorous activity for 1 week

## 2019-02-17 NOTE — Op Note (Signed)
REMOVAL PORT-A-CATH  Procedure Note  Tonya Vincent 02/17/2019   Pre-op Diagnosis: port no longer needed     Post-op Diagnosis: same  Procedure(s): REMOVAL PORT-A-CATH  Surgeon(s): Coralie Keens, MD  Anesthesia: Monitor Anesthesia Care  Staff:  Circulator: Carron Curie, RN Scrub Person: Leticia Clas, Benjaman Lobe, RN  Estimated Blood Loss: Minimal               Indications: This patient has a Port-A-Cath in the left subclavian vein that was placed approximately 9 years ago.  It is no longer been used and has not been flushed in several years.  She has been having some shoulder discomfort.  Ultrasound was negative for DVT.  She requested Port-A-Cath removal  Procedure: The patient was brought to the operating room and identified as correct patient.  She was placed upon the operating table and anesthesia was induced.  Her left chest was then prepped and draped in usual sterile fashion.  The Port-A-Cath had been placed in left subclavian vein and then tunneled down to the left breast area.  I anesthetized both incisions with lidocaine.  I then made an incision in the lower incision over the port and dissected down to the port with the cautery.  I then was able to elevate the port out of the pocket completely.  I then tunneled toward the entrance site and below the clavicle I with blunt dissection.  The catheter would not pull out of the vein despite vigorous attempts.  I then made a counterincision through the more superior scar with a scalpel and tunneled down to the port catheter site.  Again it was going underneath the clavicle and into the subclavian vein.  It appeared fixed in place.  It would not remove it from the vein easily.  I continued to pull on it slowly with significant tension and the catheter finally came out.  Looking at the length, I suspect some still may be in the vein.  There was no flow through the catheter at all.  Hemostasis appeared to be achieved.  I  anesthetized the incisions further.  I then closed subcutaneous tissue with interrupted 3-0 Vicryl sutures and closed the skin with running 4-0 Monocryl sutures.  Dermabond was then applied.  The patient tolerated the procedure well.  All the counts were correct at the end of the procedure.  The patient was then taken in a stable condition from the operating room to the recovery room.          Coralie Keens   Date: 02/17/2019  Time: 8:09 AM

## 2019-02-17 NOTE — Anesthesia Postprocedure Evaluation (Signed)
Anesthesia Post Note  Patient: Tonya Vincent  Procedure(s) Performed: REMOVAL PORT-A-CATH (N/A Chest)     Patient location during evaluation: PACU Anesthesia Type: General Level of consciousness: awake and alert Pain management: pain level controlled Vital Signs Assessment: post-procedure vital signs reviewed and stable Respiratory status: spontaneous breathing, nonlabored ventilation and respiratory function stable Cardiovascular status: blood pressure returned to baseline and stable Postop Assessment: no apparent nausea or vomiting Anesthetic complications: no    Last Vitals:  Vitals:   02/17/19 0843 02/17/19 0845  BP:    Pulse: (!) 58   Resp: 14   Temp:  (!) 36.1 C  SpO2: 94%     Last Pain:  Vitals:   02/17/19 0845  TempSrc:   PainSc: 0-No pain                 Lynda Rainwater

## 2019-02-18 ENCOUNTER — Encounter (HOSPITAL_COMMUNITY): Payer: Self-pay | Admitting: Surgery

## 2019-05-18 DIAGNOSIS — C20 Malignant neoplasm of rectum: Secondary | ICD-10-CM | POA: Diagnosis not present

## 2019-05-18 DIAGNOSIS — I10 Essential (primary) hypertension: Secondary | ICD-10-CM | POA: Diagnosis not present

## 2019-05-18 DIAGNOSIS — N189 Chronic kidney disease, unspecified: Secondary | ICD-10-CM | POA: Diagnosis not present

## 2019-05-18 DIAGNOSIS — E1100 Type 2 diabetes mellitus with hyperosmolarity without nonketotic hyperglycemic-hyperosmolar coma (NKHHC): Secondary | ICD-10-CM | POA: Diagnosis not present

## 2019-05-21 DIAGNOSIS — I10 Essential (primary) hypertension: Secondary | ICD-10-CM | POA: Diagnosis not present

## 2019-05-21 DIAGNOSIS — E1169 Type 2 diabetes mellitus with other specified complication: Secondary | ICD-10-CM | POA: Diagnosis not present

## 2019-05-21 DIAGNOSIS — S43002A Unspecified subluxation of left shoulder joint, initial encounter: Secondary | ICD-10-CM | POA: Diagnosis not present

## 2019-05-22 ENCOUNTER — Ambulatory Visit (HOSPITAL_COMMUNITY)
Admission: RE | Admit: 2019-05-22 | Discharge: 2019-05-22 | Disposition: A | Discharge status: Home / Self Care | Payer: PPO | Source: Ambulatory Visit | Attending: Family Medicine | Admitting: Family Medicine

## 2019-05-22 ENCOUNTER — Other Ambulatory Visit: Payer: Self-pay

## 2019-05-22 ENCOUNTER — Other Ambulatory Visit (HOSPITAL_COMMUNITY): Payer: Self-pay | Admitting: Family Medicine

## 2019-05-22 DIAGNOSIS — S43002A Unspecified subluxation of left shoulder joint, initial encounter: Secondary | ICD-10-CM | POA: Insufficient documentation

## 2019-05-22 DIAGNOSIS — M25512 Pain in left shoulder: Secondary | ICD-10-CM | POA: Diagnosis not present

## 2019-05-22 DIAGNOSIS — M19012 Primary osteoarthritis, left shoulder: Secondary | ICD-10-CM | POA: Diagnosis not present

## 2019-06-15 ENCOUNTER — Other Ambulatory Visit: Payer: Self-pay | Admitting: *Deleted

## 2019-06-15 DIAGNOSIS — Z20822 Contact with and (suspected) exposure to covid-19: Secondary | ICD-10-CM

## 2019-06-19 LAB — NOVEL CORONAVIRUS, NAA: SARS-CoV-2, NAA: NOT DETECTED

## 2019-07-02 DIAGNOSIS — R51 Headache: Secondary | ICD-10-CM | POA: Diagnosis not present

## 2019-07-02 DIAGNOSIS — M62838 Other muscle spasm: Secondary | ICD-10-CM | POA: Diagnosis not present

## 2019-07-02 DIAGNOSIS — E1169 Type 2 diabetes mellitus with other specified complication: Secondary | ICD-10-CM | POA: Diagnosis not present

## 2019-07-06 ENCOUNTER — Other Ambulatory Visit: Payer: Self-pay

## 2019-10-05 DIAGNOSIS — M62838 Other muscle spasm: Secondary | ICD-10-CM | POA: Diagnosis not present

## 2019-10-05 DIAGNOSIS — F330 Major depressive disorder, recurrent, mild: Secondary | ICD-10-CM | POA: Diagnosis not present

## 2019-10-05 DIAGNOSIS — E1169 Type 2 diabetes mellitus with other specified complication: Secondary | ICD-10-CM | POA: Diagnosis not present

## 2019-10-05 DIAGNOSIS — I10 Essential (primary) hypertension: Secondary | ICD-10-CM | POA: Diagnosis not present

## 2019-10-09 DIAGNOSIS — M9901 Segmental and somatic dysfunction of cervical region: Secondary | ICD-10-CM | POA: Diagnosis not present

## 2019-10-09 DIAGNOSIS — M542 Cervicalgia: Secondary | ICD-10-CM | POA: Diagnosis not present

## 2019-10-09 DIAGNOSIS — M546 Pain in thoracic spine: Secondary | ICD-10-CM | POA: Diagnosis not present

## 2019-10-09 DIAGNOSIS — M9902 Segmental and somatic dysfunction of thoracic region: Secondary | ICD-10-CM | POA: Diagnosis not present

## 2019-10-12 DIAGNOSIS — M542 Cervicalgia: Secondary | ICD-10-CM | POA: Diagnosis not present

## 2019-10-12 DIAGNOSIS — M9902 Segmental and somatic dysfunction of thoracic region: Secondary | ICD-10-CM | POA: Diagnosis not present

## 2019-10-12 DIAGNOSIS — M9901 Segmental and somatic dysfunction of cervical region: Secondary | ICD-10-CM | POA: Diagnosis not present

## 2019-10-12 DIAGNOSIS — M546 Pain in thoracic spine: Secondary | ICD-10-CM | POA: Diagnosis not present

## 2019-10-15 DIAGNOSIS — M9902 Segmental and somatic dysfunction of thoracic region: Secondary | ICD-10-CM | POA: Diagnosis not present

## 2019-10-15 DIAGNOSIS — M546 Pain in thoracic spine: Secondary | ICD-10-CM | POA: Diagnosis not present

## 2019-10-15 DIAGNOSIS — M542 Cervicalgia: Secondary | ICD-10-CM | POA: Diagnosis not present

## 2019-10-15 DIAGNOSIS — M9901 Segmental and somatic dysfunction of cervical region: Secondary | ICD-10-CM | POA: Diagnosis not present

## 2019-10-26 DIAGNOSIS — M9901 Segmental and somatic dysfunction of cervical region: Secondary | ICD-10-CM | POA: Diagnosis not present

## 2019-10-26 DIAGNOSIS — M546 Pain in thoracic spine: Secondary | ICD-10-CM | POA: Diagnosis not present

## 2019-10-26 DIAGNOSIS — M542 Cervicalgia: Secondary | ICD-10-CM | POA: Diagnosis not present

## 2019-10-26 DIAGNOSIS — M9902 Segmental and somatic dysfunction of thoracic region: Secondary | ICD-10-CM | POA: Diagnosis not present

## 2019-10-28 DIAGNOSIS — M9902 Segmental and somatic dysfunction of thoracic region: Secondary | ICD-10-CM | POA: Diagnosis not present

## 2019-10-28 DIAGNOSIS — M542 Cervicalgia: Secondary | ICD-10-CM | POA: Diagnosis not present

## 2019-10-28 DIAGNOSIS — M9901 Segmental and somatic dysfunction of cervical region: Secondary | ICD-10-CM | POA: Diagnosis not present

## 2019-10-28 DIAGNOSIS — M546 Pain in thoracic spine: Secondary | ICD-10-CM | POA: Diagnosis not present

## 2019-11-04 ENCOUNTER — Encounter: Payer: Self-pay | Admitting: Internal Medicine

## 2019-11-09 DIAGNOSIS — Z0000 Encounter for general adult medical examination without abnormal findings: Secondary | ICD-10-CM | POA: Diagnosis not present

## 2019-11-09 DIAGNOSIS — E1169 Type 2 diabetes mellitus with other specified complication: Secondary | ICD-10-CM | POA: Diagnosis not present

## 2019-11-09 DIAGNOSIS — I10 Essential (primary) hypertension: Secondary | ICD-10-CM | POA: Diagnosis not present

## 2019-11-09 DIAGNOSIS — F064 Anxiety disorder due to known physiological condition: Secondary | ICD-10-CM | POA: Diagnosis not present

## 2019-11-09 DIAGNOSIS — M940 Chondrocostal junction syndrome [Tietze]: Secondary | ICD-10-CM | POA: Diagnosis not present

## 2019-11-09 DIAGNOSIS — M130 Polyarthritis, unspecified: Secondary | ICD-10-CM | POA: Diagnosis not present

## 2019-11-11 DIAGNOSIS — M9902 Segmental and somatic dysfunction of thoracic region: Secondary | ICD-10-CM | POA: Diagnosis not present

## 2019-11-11 DIAGNOSIS — M542 Cervicalgia: Secondary | ICD-10-CM | POA: Diagnosis not present

## 2019-11-11 DIAGNOSIS — M9901 Segmental and somatic dysfunction of cervical region: Secondary | ICD-10-CM | POA: Diagnosis not present

## 2019-11-11 DIAGNOSIS — M546 Pain in thoracic spine: Secondary | ICD-10-CM | POA: Diagnosis not present

## 2019-11-14 ENCOUNTER — Ambulatory Visit (INDEPENDENT_AMBULATORY_CARE_PROVIDER_SITE_OTHER): Payer: PPO

## 2019-11-14 DIAGNOSIS — R079 Chest pain, unspecified: Secondary | ICD-10-CM

## 2019-11-14 DIAGNOSIS — I499 Cardiac arrhythmia, unspecified: Secondary | ICD-10-CM

## 2019-11-17 ENCOUNTER — Encounter: Payer: Self-pay | Admitting: Internal Medicine

## 2019-11-19 ENCOUNTER — Other Ambulatory Visit: Payer: Self-pay

## 2019-11-19 DIAGNOSIS — R079 Chest pain, unspecified: Secondary | ICD-10-CM

## 2019-11-30 DIAGNOSIS — H40023 Open angle with borderline findings, high risk, bilateral: Secondary | ICD-10-CM | POA: Diagnosis not present

## 2019-11-30 DIAGNOSIS — K6281 Anal sphincter tear (healed) (nontraumatic) (old): Secondary | ICD-10-CM | POA: Diagnosis not present

## 2019-11-30 DIAGNOSIS — F331 Major depressive disorder, recurrent, moderate: Secondary | ICD-10-CM | POA: Diagnosis not present

## 2019-11-30 DIAGNOSIS — E1169 Type 2 diabetes mellitus with other specified complication: Secondary | ICD-10-CM | POA: Diagnosis not present

## 2019-11-30 DIAGNOSIS — Z803 Family history of malignant neoplasm of breast: Secondary | ICD-10-CM | POA: Diagnosis not present

## 2019-11-30 DIAGNOSIS — I10 Essential (primary) hypertension: Secondary | ICD-10-CM | POA: Diagnosis not present

## 2019-11-30 DIAGNOSIS — Z1231 Encounter for screening mammogram for malignant neoplasm of breast: Secondary | ICD-10-CM | POA: Diagnosis not present

## 2019-11-30 DIAGNOSIS — L26 Exfoliative dermatitis: Secondary | ICD-10-CM | POA: Diagnosis not present

## 2019-12-08 ENCOUNTER — Other Ambulatory Visit: Payer: Self-pay

## 2019-12-08 ENCOUNTER — Ambulatory Visit (AMBULATORY_SURGERY_CENTER): Payer: Self-pay | Admitting: *Deleted

## 2019-12-08 VITALS — Temp 96.9°F | Ht 66.0 in | Wt 191.0 lb

## 2019-12-08 DIAGNOSIS — Z85048 Personal history of other malignant neoplasm of rectum, rectosigmoid junction, and anus: Secondary | ICD-10-CM

## 2019-12-08 NOTE — Progress Notes (Signed)
Patient has on heart event monitor for 1 month. She then has an office visit with Dr.Bland to get results. Pt is aware that we need cardiac clearance from her PCP before the colonoscopy can be done. Pt will call us back to reschedule the colonoscopy after her OV with PCP. Patient also describes having diarrhea that started today with blood clots and mucus. Office visit made by Jacques Navy to see [place] [facility]. Pt has a hx of rectal cancer. Pt is aware of the appointment.

## 2019-12-15 ENCOUNTER — Other Ambulatory Visit: Payer: Self-pay

## 2019-12-15 ENCOUNTER — Ambulatory Visit: Payer: PPO | Admitting: Gastroenterology

## 2019-12-15 ENCOUNTER — Encounter: Payer: Self-pay | Admitting: Gastroenterology

## 2019-12-15 VITALS — BP 134/74 | HR 84 | Temp 98.5°F | Ht 62.5 in | Wt 193.4 lb

## 2019-12-15 DIAGNOSIS — K625 Hemorrhage of anus and rectum: Secondary | ICD-10-CM | POA: Diagnosis not present

## 2019-12-15 DIAGNOSIS — Z86010 Personal history of colonic polyps: Secondary | ICD-10-CM

## 2019-12-15 DIAGNOSIS — Z85048 Personal history of other malignant neoplasm of rectum, rectosigmoid junction, and anus: Secondary | ICD-10-CM | POA: Diagnosis not present

## 2019-12-15 DIAGNOSIS — R151 Fecal smearing: Secondary | ICD-10-CM

## 2019-12-15 NOTE — Patient Instructions (Signed)
Please purchase the following medications over the counter and take as directed: Benefiber-2 tablespoons in 8 ounces water/juice once daily.  Call our office back once you have been given cardiac clearance so that you can schedule your colonoscopy as previously discussed.  If you are age 68 or older, your body mass index should be between 23-30. Your Body mass index is 34.81 kg/m. If this is out of the aforementioned range listed, please consider follow up with your Primary Care Provider.  If you are age 2 or younger, your body mass index should be between 19-25. Your Body mass index is 34.81 kg/m. If this is out of the aformentioned range listed, please consider follow up with your Primary Care Provider.

## 2019-12-15 NOTE — Progress Notes (Signed)
12/15/2019 Tonya Vincent XY:[id number] 1951-04-12   HISTORY OF PRESENT ILLNESS:  This is a pleasant 69 year old female is a patient of Dr. Blanch Media.  Has history of rectal cancer that required surgery and neoadjuvant therapy.  Her last colonoscopy was in November 2017 at which time she was found to have 2 polyps that were removed.  One was a hyperplastic polyp and the other was a sessile serrated adenoma.  She was due for repeat colonoscopy in November 2017.  She came in for a previsit on January 5 to schedule colonoscopy, but had a heart event monitor on that she was wearing for a total of 1 month.  She was told that she needed to have that removed and have clearance from her PCP before scheduling colonoscopy.  She also described having diarrhea with blood clots and mucus so she was scheduled for an appointment with me today.  She has completed the event monitor and has to return it to the company today.  Anyway, she describes intermittent bleeding recently with some clots.  She says that on occasion she will leak some stool and mucus from her rectum.  Denies any abdominal pain or rectal pain.  She has had complaints of rectal bleeding on and off in the past, including in 2017 prior to that colonoscopy as well.   Past Medical History:  Diagnosis Date  . Anemia   . Anxiety   . Complication of anesthesia    difficulty waking up  . Depression   . Diabetes mellitus    type 2 DM  . Heart murmur   . History of rectal cancer 10/04/2011  . Hypertension   . Overactive bladder   . 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 Rectal cancer ([place]) dx'd 11/16/09   xrt comp 01/2010; chemo comp 01/2011  . Tubular adenoma of colon [id number]   Past Surgical History:  Procedure Laterality Date  . ABDOMINAL HYSTERECTOMY     for uterine fibroids  . COLON SURGERY N/A 04/24/2010   "took out half rectum"; rectal cancer  . EYE SURGERY     lasik surgery  . HAND SURGERY     carpal tunnel- left  . [place]  . PORT-A-CATH REMOVAL N/A 02/17/2019   Procedure: REMOVAL PORT-A-CATH;  Surgeon: Coralie Keens, MD;  Location: [place];  Service: General;  Laterality: N/A;  . PORTACATH PLACEMENT     2011  . WISDOM TOOTH EXTRACTION      reports that she quit smoking about 10 years ago. Her smoking use included cigarettes and cigars. She has a 17.50 pack-year smoking history. She has never used smokeless tobacco. She reports current alcohol use. She reports that she does not use drugs. family history includes Leukemia (age of onset: 18) in her father. Allergies  Allergen Reactions  . Advanced Hand Sanitizer [Alcohol] Dermatitis  . Norgesic [Orphenadrine-Aspirin-Caffeine] Hives  . Antibacterial Hand Soap [Triclosan] Dermatitis      Outpatient Encounter Medications as of 12/15/2019  Medication Sig  . amLODipine (NORVASC) 10 MG tablet Take 10 mg by mouth at bedtime.   Marland Kitchen aspirin EC 81 MG tablet Take 81 mg by mouth at bedtime.   . docusate sodium (COLACE) 100 MG capsule Take 100-200 mg by mouth daily as needed for mild constipation.  . empagliflozin (JARDIANCE) 25 MG TABS tablet Take 25 mg by mouth daily with supper.   . latanoprost (XALATAN) 0.005 % ophthalmic solution Place 1 drop into both eyes at bedtime.   Marland Kitchen  losartan (COZAAR) 50 MG tablet Take 50 mg by mouth daily.  . meloxicam (MOBIC) 7.5 MG tablet Take 7.5 mg by mouth 2 (two) times daily as needed for pain.   . Menthol, Topical Analgesic, 16 % LIQD Apply 1 application topically 2 (two) times daily as needed (pain).  . methylphenidate 36 MG PO CR tablet Take 36 mg by mouth daily with breakfast.  . mirabegron ER (MYRBETRIQ) 25 MG TB24 tablet Take 25 mg by mouth daily.  Marland Kitchen Propylene Glycol 0.6 % SOLN Place 1 drop into both eyes 3 (three) times daily.   . Semaglutide (OZEMPIC, 1 MG/DOSE, ) Inject into the skin.  Marland Kitchen traMADol (ULTRAM) 50 MG tablet Take 1 tablet (50 mg total) by mouth every 6 (six) hours as needed for moderate pain or severe pain.   Marland Kitchen vortioxetine HBr (TRINTELLIX) 10 MG TABS tablet Take 10 mg by mouth daily with supper.   . [DISCONTINUED] Icosapent Ethyl (VASCEPA) 1 g CAPS Take 1 g by mouth daily with supper. Vascepa   No facility-administered encounter medications on file as of 12/15/2019.     REVIEW OF SYSTEMS  : All other systems reviewed and negative except where noted in the History of Present Illness.   PHYSICAL EXAM: BP 134/74 (BP Location: Left Arm, Patient Position: Sitting, Cuff Size: Normal)   Pulse 84   Temp 98.5 F (36.9 C)   Ht 5' 2.5" (1.588 m) Comment: height measured without shoes  Wt 193 lb 6 oz (87.7 kg)   BMI 34.81 kg/m  General: Well developed female in no acute distress Head: Normocephalic and atraumatic Eyes:  Sclerae anicteric, conjunctiva pink. Ears: Normal auditory acuity Lungs: Clear throughout to auscultation; no increased WOB. Heart: Regular rate and rhythm; no M/R/G. Abdomen: Soft, non-distended.  BS present.  Non-tender. Rectal: No significant external abnormalities.  DRE revealed moderate amount of soft brown stool in the rectum.  There was what seemed to be an abnormal ridge posteriorly in the rectum question scar tissue versus other.  No mass identified.  Anoscopy not performed due to presence of stool in the rectum. Musculoskeletal: Symmetrical with no gross deformities  Skin: No lesions on visible extremities Extremities: No edema  Neurological: Alert oriented x 4, grossly non-focal Psychological:  Alert and cooperative. Normal mood and affect  ASSESSMENT AND PLAN: *69 year old female with history of rectal cancer and adenomatous colon polyps with last colonoscopy in November 2017.  Presents here with complaints of rectal bleeding and some anal leakage.  Rectal exam today revealed a ridge posteriorly in the rectal area.  Question scar tissue versus other.  Anoscopy not performed due to moderate amount of soft stool in the rectum.  She needs colonoscopy as soon as the results  from her cardiac event monitor have been reviewed by her PCP and she is cleared from their standpoint.  For the leakage I recommended Benefiber 2 tablespoons in 8 ounces of liquid daily to try to help bulk the stool.  Could consider pelvic floor physical therapy in the future if needed.   CC:  [name], MD

## 2019-12-17 ENCOUNTER — Other Ambulatory Visit (HOSPITAL_COMMUNITY): Payer: PPO

## 2019-12-18 ENCOUNTER — Encounter: Payer: Self-pay | Admitting: Gastroenterology

## 2019-12-18 DIAGNOSIS — Z860100 Personal history of colon polyps, unspecified: Secondary | ICD-10-CM | POA: Insufficient documentation

## 2019-12-18 DIAGNOSIS — R151 Fecal smearing: Secondary | ICD-10-CM | POA: Insufficient documentation

## 2019-12-18 NOTE — Progress Notes (Signed)
Assessment and plan reviewed 

## 2019-12-21 ENCOUNTER — Encounter: Payer: PPO | Admitting: Internal Medicine

## 2019-12-23 ENCOUNTER — Ambulatory Visit (AMBULATORY_SURGERY_CENTER): Payer: Self-pay | Admitting: *Deleted

## 2019-12-23 ENCOUNTER — Other Ambulatory Visit: Payer: Self-pay

## 2019-12-23 VITALS — Temp 97.4°F | Ht 62.5 in | Wt 197.0 lb

## 2019-12-23 DIAGNOSIS — K625 Hemorrhage of anus and rectum: Secondary | ICD-10-CM

## 2019-12-23 DIAGNOSIS — Z85048 Personal history of other malignant neoplasm of rectum, rectosigmoid junction, and anus: Secondary | ICD-10-CM

## 2019-12-23 NOTE — Progress Notes (Signed)
Pt is aware that care partner will wait in the car during procedure; if they feel like they will be too hot or cold to wait in the car; they may wait in the 4 th floor lobby. Patient is aware to bring only one care partner. We want them to wear a mask (we do not have any that we can provide them), practice social distancing, and we will check their temperatures when they get here.  I did remind the patient that their care partner needs to stay in the parking lot the entire time and have a cell phone available, we will call them when the pt is ready for discharge. Patient will wear mask into building.  Plenvu sample given  No egg or soy allergy  No home oxygen use or problems with anesthesia  No medications for weight loss taken  Pt's covid test scheduled at [facility]- 12-31-19 at 1:00 pm

## 2019-12-24 ENCOUNTER — Encounter: Payer: Self-pay | Admitting: Internal Medicine

## 2019-12-31 ENCOUNTER — Ambulatory Visit: Payer: PPO | Admitting: Gastroenterology

## 2019-12-31 ENCOUNTER — Other Ambulatory Visit: Payer: Self-pay

## 2019-12-31 ENCOUNTER — Other Ambulatory Visit (HOSPITAL_COMMUNITY)
Admission: RE | Admit: 2019-12-31 | Discharge: 2019-12-31 | Disposition: A | Discharge status: Home / Self Care | Payer: PPO | Source: Ambulatory Visit | Attending: Internal Medicine | Admitting: Internal Medicine

## 2019-12-31 DIAGNOSIS — Z01812 Encounter for preprocedural laboratory examination: Secondary | ICD-10-CM | POA: Diagnosis not present

## 2019-12-31 DIAGNOSIS — Z20822 Contact with and (suspected) exposure to covid-19: Secondary | ICD-10-CM | POA: Insufficient documentation

## 2019-12-31 LAB — SARS CORONAVIRUS 2 (TAT 6-24 HRS): SARS Coronavirus 2: NEGATIVE

## 2020-01-05 ENCOUNTER — Encounter: Payer: Self-pay | Admitting: Internal Medicine

## 2020-01-05 ENCOUNTER — Ambulatory Visit (AMBULATORY_SURGERY_CENTER): Payer: PPO | Admitting: Internal Medicine

## 2020-01-05 ENCOUNTER — Other Ambulatory Visit: Payer: Self-pay

## 2020-01-05 VITALS — BP 117/64 | HR 74 | Temp 95.9°F | Resp 16 | Ht 62.5 in | Wt 197.0 lb

## 2020-01-05 DIAGNOSIS — D125 Benign neoplasm of sigmoid colon: Secondary | ICD-10-CM | POA: Diagnosis not present

## 2020-01-05 DIAGNOSIS — D123 Benign neoplasm of transverse colon: Secondary | ICD-10-CM

## 2020-01-05 DIAGNOSIS — K627 Radiation proctitis: Secondary | ICD-10-CM

## 2020-01-05 DIAGNOSIS — Z85048 Personal history of other malignant neoplasm of rectum, rectosigmoid junction, and anus: Secondary | ICD-10-CM | POA: Diagnosis not present

## 2020-01-05 DIAGNOSIS — K625 Hemorrhage of anus and rectum: Secondary | ICD-10-CM

## 2020-01-05 MED ORDER — SODIUM CHLORIDE 0.9 % IV SOLN: 500.0000 mL | Freq: Once | INTRAVENOUS | Status: DC

## 2020-01-05 NOTE — Progress Notes (Signed)
Called to room to assist during endoscopic procedure.  Patient ID and intended procedure confirmed with present staff. Received instructions for my participation in the procedure from the performing physician.  

## 2020-01-05 NOTE — Patient Instructions (Signed)
Handout on polyps given to you today  Await pathology results   YOU HAD AN ENDOSCOPIC PROCEDURE TODAY AT THE [facility]:   Refer to the procedure report that was given to you for any specific questions about what was found during the examination.  If the procedure report does not answer your questions, please call your gastroenterologist to clarify.  If you requested that your care partner not be given the details of your procedure findings, then the procedure report has been included in a sealed envelope for you to review at your convenience later.  YOU SHOULD EXPECT: Some feelings of bloating in the abdomen. Passage of more gas than usual.  Walking can help get rid of the air that was put into your GI tract during the procedure and reduce the bloating. If you had a lower endoscopy (such as a colonoscopy or flexible sigmoidoscopy) you may notice spotting of blood in your stool or on the toilet paper. If you underwent a bowel prep for your procedure, you may not have a normal bowel movement for a few days.  Please Note:  You might notice some irritation and congestion in your nose or some drainage.  This is from the oxygen used during your procedure.  There is no need for concern and it should clear up in a day or so.  SYMPTOMS TO REPORT IMMEDIATELY:   Following lower endoscopy (colonoscopy or flexible sigmoidoscopy):  Excessive amounts of blood in the stool  Significant tenderness or worsening of abdominal pains  Swelling of the abdomen that is new, acute  Fever of 100F or higher  For urgent or emergent issues, a gastroenterologist can be reached at any hour by calling [phone].   DIET:  We do recommend a small meal at first, but then you may proceed to your regular diet.  Drink plenty of fluids but you should avoid alcoholic beverages for 24 hours.  ACTIVITY:  You should plan to take it easy for the rest of today and you should NOT DRIVE or use heavy machinery until  tomorrow (because of the sedation medicines used during the test).    FOLLOW UP: Our staff will call the number listed on your records 48-72 hours following your procedure to check on you and address any questions or concerns that you may have regarding the information given to you following your procedure. If we do not reach you, we will leave a message.  We will attempt to reach you two times.  During this call, we will ask if you have developed any symptoms of COVID 19. If you develop any symptoms (ie: fever, flu-like symptoms, shortness of breath, cough etc.) before then, please call [phone].  If you test positive for Covid 19 in the 2 weeks post procedure, please call and report this information to us.    If any biopsies were taken you will be contacted by phone or by letter within the next 1-3 weeks.  Please call us at [phone] if you have not heard about the biopsies in 3 weeks.    SIGNATURES/CONFIDENTIALITY: You and/or your care partner have signed paperwork which will be entered into your electronic medical record.  These signatures attest to the fact that that the information above on your After Visit Summary has been reviewed and is understood.  Full responsibility of the confidentiality of this discharge information lies with you and/or your care-partner.  

## 2020-01-05 NOTE — Progress Notes (Signed)
Temperature taken by J.B., VS taken by N.C. 

## 2020-01-05 NOTE — Op Note (Signed)
[place] Patient Name: Tonya Vincent Procedure Date: 01/05/2020 11:05 AM MRN: [id number] Endoscopist: Docia Chuck. Henrene Pastor , MD Age: 69 Referring MD:  Date of Birth: November 18, 1951 Gender: Female Account #: [id number] Procedure:                Colonoscopy cold snare polypectomy x 2 Indications:              High risk colon cancer surveillance: Personal                            history of non-advanced adenoma, High risk colon                            cancer surveillance: Personal history of sessile                            serrated colon polyp (less than 10 mm in size) with                            no dysplasia, High risk colon cancer surveillance:                            Personal history of colon cancer. Diagnosed with                            rectal cancer 2010 (Dr. Benson [place]). Status post                            neoadjuvant chemoradiation therapy followed by                            surgical excision. Subsequent surveillance                            colonoscopy examinations 2015 and 2017. Recently                            with transient rectal bleeding Medicines:                Monitored Anesthesia Care Procedure:                Pre-Anesthesia Assessment:                           - Prior to the procedure, a History and Physical                            was performed, and patient medications and                            allergies were reviewed. The patient's tolerance of                            previous anesthesia was also reviewed. The risks  and benefits of the procedure and the sedation                            options and risks were discussed with the patient.                            All questions were answered, and informed consent                            was obtained. Prior Anticoagulants: The patient has                            taken no previous anticoagulant or antiplatelet                            agents.  ASA Grade Assessment: II - A patient with                            mild systemic disease. After reviewing the risks                            and benefits, the patient was deemed in                            satisfactory condition to undergo the procedure.                           After obtaining informed consent, the colonoscope                            was passed under direct vision. Throughout the                            procedure, the patient's blood pressure, pulse, and                            oxygen saturations were monitored continuously. The                            Colonoscope was introduced through the anus and                            advanced to the the cecum, identified by                            appendiceal orifice and ileocecal valve. The                            ileocecal valve, appendiceal orifice, and rectum                            were photographed. The quality of the bowel  preparation was excellent. The colonoscopy was                            performed without difficulty. The patient tolerated                            the procedure well. The bowel preparation used was                            SUPREP via split dose instruction. Scope In: 11:24:39 AM Scope Out: 11:38:53 AM Scope Withdrawal Time: 0 hours 11 minutes 39 seconds  Total Procedure Duration: 0 hours 14 minutes 14 seconds  Findings:                 Two polyps were found in the sigmoid colon and                            transverse colon. The polyps were 2 to 3 mm in                            size. These polyps were removed with a cold snare.                            Resection and retrieval were complete.                           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 The exam was otherwise without                            abnormality on direct views. No attempted                             retroflexion due to altered rectal anatomy. Complications:            No immediate complications. Estimated blood loss:                            None. Estimated Blood Loss:     Estimated blood loss: none. Impression:               - Two 2 to 3 mm polyps in the sigmoid colon and in                            the transverse colon, removed with a cold snare.                            Resected and retrieved.                           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 Recommendation:           - Repeat colonoscopy in 5 years for surveillance.                           - Patient has a contact number available for                            emergencies. The signs and symptoms of potential                            delayed complications were discussed with the                            patient. Return to normal activities tomorrow.                            Written discharge instructions were provided to the                            patient.                           - Resume previous diet.                           - Continue present medications.                           - Await pathology results. Docia Chuck. Henrene Pastor, MD 01/05/2020 11:52:18 AM This report has been signed electronically.

## 2020-01-05 NOTE — Progress Notes (Signed)
Pt tolerated well. VSS. Awake and to recovery. 

## 2020-01-05 NOTE — Progress Notes (Signed)
Pt's states no medical or surgical changes since previsit or office visit. 

## 2020-01-07 ENCOUNTER — Telehealth: Payer: Self-pay

## 2020-01-07 NOTE — Telephone Encounter (Signed)
  Follow up Call-  Call back number 01/05/2020  Post procedure Call Back phone  # [phone]  Permission to leave phone message Yes  Some recent data might be hidden     Patient questions:  Do you have a fever, pain , or abdominal swelling? No. Pain Score  0 *  Have you tolerated food without any problems? Yes.    Have you been able to return to your normal activities? Yes.    Do you have any questions about your discharge instructions: Diet   No. Medications  No. Follow up visit  No.  Do you have questions or concerns about your Care? No.  Actions: * If pain score is 4 or above: 1. No action needed, pain <4.Have you developed a fever since your procedure? no  2.   Have you had an respiratory symptoms (SOB or cough) since your procedure? no  3.   Have you tested positive for COVID 19 since your procedure no  4.   Have you had any family members/close contacts diagnosed with the COVID 19 since your procedure?  no   If yes to any of these questions please route to Joylene John, RN and Alphonsa Gin, Therapist, sports.

## 2020-01-08 ENCOUNTER — Encounter: Payer: Self-pay | Admitting: Internal Medicine

## 2020-01-09 ENCOUNTER — Ambulatory Visit: Payer: PPO | Attending: Internal Medicine

## 2020-01-09 DIAGNOSIS — Z23 Encounter for immunization: Secondary | ICD-10-CM | POA: Insufficient documentation

## 2020-01-09 NOTE — Progress Notes (Signed)
   Covid-19 Vaccination Clinic  Name:  Tonya Vincent    MRN: [id number] DOB: 1951/06/29  01/09/2020  [name] was observed post Covid-19 immunization for 15 minutes without incidence. She was provided with Vaccine Information Sheet and instruction to access the V-Safe system.   [name] was instructed to call 911 with any severe reactions post vaccine: Marland Kitchen Difficulty breathing  . Swelling of your face and throat  . A fast heartbeat  . A bad rash all over your body  . Dizziness and weakness    Immunizations Administered    Name Date Dose VIS Date Route   Moderna COVID-19 Vaccine 01/09/2020 11:07 AM 0.5 mL 11/03/2019 Intramuscular   Manufacturer: Moderna   Lot: [id number]   [id number]

## 2020-01-15 DIAGNOSIS — S090XXS Injury of blood vessels of head, not elsewhere classified, sequela: Secondary | ICD-10-CM | POA: Diagnosis not present

## 2020-01-15 DIAGNOSIS — N189 Chronic kidney disease, unspecified: Secondary | ICD-10-CM | POA: Diagnosis not present

## 2020-01-15 DIAGNOSIS — S090XXA Injury of blood vessels of head, not elsewhere classified, initial encounter: Secondary | ICD-10-CM | POA: Diagnosis not present

## 2020-01-15 DIAGNOSIS — C20 Malignant neoplasm of rectum: Secondary | ICD-10-CM | POA: Diagnosis not present

## 2020-01-15 DIAGNOSIS — E1169 Type 2 diabetes mellitus with other specified complication: Secondary | ICD-10-CM | POA: Diagnosis not present

## 2020-01-15 DIAGNOSIS — M130 Polyarthritis, unspecified: Secondary | ICD-10-CM | POA: Diagnosis not present

## 2020-02-01 DIAGNOSIS — I10 Essential (primary) hypertension: Secondary | ICD-10-CM | POA: Diagnosis not present

## 2020-02-01 DIAGNOSIS — K219 Gastro-esophageal reflux disease without esophagitis: Secondary | ICD-10-CM | POA: Diagnosis not present

## 2020-02-01 DIAGNOSIS — E1169 Type 2 diabetes mellitus with other specified complication: Secondary | ICD-10-CM | POA: Diagnosis not present

## 2020-02-01 DIAGNOSIS — F423 Hoarding disorder: Secondary | ICD-10-CM | POA: Diagnosis not present

## 2020-02-01 DIAGNOSIS — Z6834 Body mass index (BMI) 34.0-34.9, adult: Secondary | ICD-10-CM | POA: Diagnosis not present

## 2020-02-09 ENCOUNTER — Ambulatory Visit: Payer: PPO | Attending: Internal Medicine

## 2020-02-09 DIAGNOSIS — Z23 Encounter for immunization: Secondary | ICD-10-CM | POA: Insufficient documentation

## 2020-02-09 NOTE — Progress Notes (Signed)
   Covid-19 Vaccination Clinic  Name:  Tonya Vincent    MRN: [id number] DOB: 20-Jun-1951  02/09/2020  [name] was observed post Covid-19 immunization for 15 minutes without incident. She was provided with Vaccine Information Sheet and instruction to access the V-Safe system.   Ms. Slight was instructed to call 911 with any severe reactions post vaccine: Marland Kitchen Difficulty breathing  . Swelling of face and throat  . A fast heartbeat  . A bad rash all over body  . Dizziness and weakness   Immunizations Administered    Name Date Dose VIS Date Route   Moderna COVID-19 Vaccine 02/09/2020 11:16 AM 0.5 mL 11/03/2019 Intramuscular   Manufacturer: Moderna   Lot: [id number]   [id number]

## 2020-02-23 DIAGNOSIS — I10 Essential (primary) hypertension: Secondary | ICD-10-CM | POA: Diagnosis not present

## 2020-03-15 DIAGNOSIS — I10 Essential (primary) hypertension: Secondary | ICD-10-CM | POA: Diagnosis not present

## 2020-04-05 ENCOUNTER — Emergency Department (HOSPITAL_COMMUNITY): Payer: PPO

## 2020-04-05 ENCOUNTER — Other Ambulatory Visit: Payer: Self-pay

## 2020-04-05 ENCOUNTER — Encounter (HOSPITAL_COMMUNITY): Payer: Self-pay | Admitting: *Deleted

## 2020-04-05 ENCOUNTER — Emergency Department (HOSPITAL_COMMUNITY)
Admission: EM | Admit: 2020-04-05 | Discharge: 2020-04-05 | Disposition: A | Discharge status: Home / Self Care | Payer: PPO | Attending: Emergency Medicine | Admitting: Emergency Medicine

## 2020-04-05 DIAGNOSIS — M25571 Pain in right ankle and joints of right foot: Secondary | ICD-10-CM | POA: Diagnosis not present

## 2020-04-05 DIAGNOSIS — S82851A Displaced trimalleolar fracture of right lower leg, initial encounter for closed fracture: Secondary | ICD-10-CM | POA: Diagnosis not present

## 2020-04-05 DIAGNOSIS — S8251XA Displaced fracture of medial malleolus of right tibia, initial encounter for closed fracture: Secondary | ICD-10-CM | POA: Diagnosis not present

## 2020-04-05 DIAGNOSIS — Z87891 Personal history of nicotine dependence: Secondary | ICD-10-CM | POA: Insufficient documentation

## 2020-04-05 DIAGNOSIS — Y9301 Activity, walking, marching and hiking: Secondary | ICD-10-CM | POA: Diagnosis not present

## 2020-04-05 DIAGNOSIS — Y999 Unspecified external cause status: Secondary | ICD-10-CM | POA: Diagnosis not present

## 2020-04-05 DIAGNOSIS — Z7982 Long term (current) use of aspirin: Secondary | ICD-10-CM | POA: Insufficient documentation

## 2020-04-05 DIAGNOSIS — Y9289 Other specified places as the place of occurrence of the external cause: Secondary | ICD-10-CM | POA: Insufficient documentation

## 2020-04-05 DIAGNOSIS — S99911A Unspecified injury of right ankle, initial encounter: Secondary | ICD-10-CM | POA: Diagnosis present

## 2020-04-05 DIAGNOSIS — W1781XA Fall down embankment (hill), initial encounter: Secondary | ICD-10-CM | POA: Diagnosis not present

## 2020-04-05 DIAGNOSIS — I10 Essential (primary) hypertension: Secondary | ICD-10-CM | POA: Insufficient documentation

## 2020-04-05 DIAGNOSIS — Z79899 Other long term (current) drug therapy: Secondary | ICD-10-CM | POA: Diagnosis not present

## 2020-04-05 DIAGNOSIS — E119 Type 2 diabetes mellitus without complications: Secondary | ICD-10-CM | POA: Insufficient documentation

## 2020-04-05 DIAGNOSIS — Z85048 Personal history of other malignant neoplasm of rectum, rectosigmoid junction, and anus: Secondary | ICD-10-CM | POA: Diagnosis not present

## 2020-04-05 MED ORDER — OXYCODONE-ACETAMINOPHEN 5-325 MG PO TABS
2.0000 | ORAL_TABLET | Freq: Once | ORAL | Status: AC
  Administered 2020-04-05: 16:00:00 2 via ORAL
  Filled 2020-04-05: qty 2

## 2020-04-05 MED ORDER — HYDROCODONE-ACETAMINOPHEN 5-325 MG PO TABS: 1.0000 | ORAL_TABLET | Freq: Four times a day (QID) | ORAL | 0 refills | Status: DC | PRN

## 2020-04-05 NOTE — ED Triage Notes (Signed)
Pt c/o right ankle injury after she fell down a hill this morning. Denies LOC upon fall. Pt reports she saw her right ankle turn inwards during the fall.

## 2020-04-05 NOTE — Discharge Instructions (Addendum)
You have been diagnosed today with Closed Trimalleolar Fracture of Right Ankle.  At this time there does not appear to be the presence of an emergent medical condition, however there is always the potential for conditions to change. Please read and follow the below instructions.  Please return to the Emergency Department immediately for any new or worsening symptoms. Please be sure to follow up with your Primary Care Provider within one week regarding your visit today; please call their office to schedule an appointment even if you are feeling better for a follow-up visit. You may take the medication Norco as prescribed to help with severe pain.  This medication will make you drowsy so do not drive, drink alcohol or perform any dangerous activities after taking Norco. Please use rest, ice and elevation to help with pain and swelling. Call Dr. Stann Mainland tomorrow morning to schedule follow-up appointment for definitive care of your ankle fracture/dislocation.  Get help right away if: Your cast gets damaged. You continue to have very bad pain. You have new pain or swelling. Your skin or toes below the injured ankle: Turn blue or gray. Feel cold or numb. Lose sensitivity to touch. You have any new/concerning or worsening of symptoms  Please read the additional information packets attached to your discharge summary.  Do not take your medicine if  develop an itchy rash, swelling in your mouth or lips, or difficulty breathing; call 911 and seek immediate emergency medical attention if this occurs.  Note: Portions of this text may have been transcribed using voice recognition software. Every effort was made to ensure accuracy; however, inadvertent computerized transcription errors may still be present.

## 2020-04-05 NOTE — ED Provider Notes (Addendum)
[facility] EMERGENCY DEPARTMENT Provider Note   CSN: [id number] Arrival date & time: 04/05/20  1319     History Chief Complaint  Patient presents with  . Ankle Injury    Tonya Vincent is a 69 y.o. female   Presents today right ankle pain onset this morning, was walking down a hill when her ankle twisted underneath her, she felt immediate severe pain which has been gradually improving with time worsened with movement and palpation improved with rest, pain is nonradiating.  She endorses swelling of the right ankle since that time.  Denies head injury, loss consciousness, blood thinner use, neck pain, back pain, chest pain, abdominal pain, numbness/tingling, weakness or pain of the other 3 extremities or any additional concerns. HPI     Past Medical History:  Diagnosis Date  . Anemia   . Anxiety   . Complication of anesthesia    difficulty waking up  . Depression   . Diabetes mellitus    type 2 DM  . Heart murmur   . History of rectal cancer 10/04/2011  . Hypertension   . Overactive bladder   . 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 Rectal cancer ([place]) dx'd 11/16/09   xrt comp 01/2010; chemo comp 01/2011  . Tubular adenoma of colon [id number]    Patient Active Problem List   Diagnosis Date Noted  . History of colonic polyps 12/18/2019  . Fecal smearing 12/18/2019  . Left arm pain 02/04/2019  . Rectal bleeding 02/06/2018  . Microcytosis 01/01/2017  . Essential hypertension 01/02/2016  . Change in stool caliber 12/31/2014  . 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 Anemia in chronic illness 12/29/2014  . History of rectal cancer 10/04/2011    Past Surgical History:  Procedure Laterality Date  . ABDOMINAL HYSTERECTOMY     for uterine fibroids  . COLON SURGERY N/A 04/24/2010   "took out half rectum"; rectal cancer  . EYE SURGERY     lasik surgery  . HAND SURGERY     carpal tunnel- left  . [place]  . PORT-A-CATH REMOVAL N/A 02/17/2019   Procedure: REMOVAL  PORT-A-CATH;  Surgeon: Coralie Keens, MD;  Location: [facility];  Service: General;  Laterality: N/A;  . PORTACATH PLACEMENT     2011  . RECTAL SURGERY     2010  . WISDOM TOOTH EXTRACTION       OB History   No obstetric history on file.     Family History  Problem Relation Age of Onset  . Leukemia Father 3  . Colon cancer Neg Hx   . Esophageal cancer Neg Hx   . Stomach cancer Neg Hx   . Rectal cancer Neg Hx     Social History   Tobacco Use  . Smoking status: Former Smoker    Packs/day: 0.50    Years: 35.00    Pack years: 17.50    Types: Cigarettes, Cigars    Quit date: 12/03/2009    Years since quitting: 10.3  . Smokeless tobacco: Never Used  Substance Use Topics  . Alcohol use: Yes    Comment: socially  . Drug use: No    Home Medications Prior to Admission medications   Medication Sig Start Date End Date Taking? Authorizing Provider  amLODipine (NORVASC) 10 MG tablet Take 10 mg by mouth at bedtime.  01/24/19   [provider]  aspirin EC 81 MG tablet Take 81 mg by mouth at bedtime.     [provider]  docusate sodium (  COLACE) 100 MG capsule Take 100-200 mg by mouth daily as needed for mild constipation.    [provider]  empagliflozin (JARDIANCE) 25 MG TABS tablet Take 25 mg by mouth daily with supper.     [provider]  HYDROcodone-acetaminophen (NORCO/VICODIN) 5-325 MG tablet Take 1-2 tablets by mouth every 6 (six) hours as needed. 04/05/20   Nuala Alpha A, PA-C  latanoprost (XALATAN) 0.005 % ophthalmic solution Place 1 drop into both eyes at bedtime.     [provider]  losartan (COZAAR) 50 MG tablet Take 50 mg by mouth daily.    [provider]  meloxicam (MOBIC) 7.5 MG tablet Take 7.5 mg by mouth 2 (two) times daily as needed for pain.     [provider]  Menthol, Topical Analgesic, 16 % LIQD Apply 1 application topically 2 (two) times daily as needed (pain).    [provider]    methylphenidate 36 MG PO CR tablet Take 36 mg by mouth daily with breakfast. 11/18/18   [provider]  mirabegron ER (MYRBETRIQ) 25 MG TB24 tablet Take 25 mg by mouth daily.    [provider]  Propylene Glycol 0.6 % SOLN Place 1 drop into both eyes 3 (three) times daily.     [provider]  Semaglutide (OZEMPIC, 1 MG/DOSE, [place]) Inject into the skin.    [provider]  traMADol (ULTRAM) 50 MG tablet Take 1 tablet (50 mg total) by mouth every 6 (six) hours as needed for moderate pain or severe pain. 02/17/19   Coralie Keens, MD  vortioxetine HBr (TRINTELLIX) 10 MG TABS tablet Take 10 mg by mouth daily with supper.     [provider]    Allergies    Advanced hand sanitizer [alcohol], Norgesic [orphenadrine-aspirin-caffeine], and Antibacterial hand soap [triclosan]  Review of Systems   Review of Systems Ten systems are reviewed and are negative for acute change except as noted in the HPI  Physical Exam Updated Vital Signs BP (!) 149/79 (BP Location: Right Arm)   Pulse 73   Temp 98.1 F (36.7 C) (Oral)   Resp 20   Ht 5\' 3"  (1.6 m)   Wt 86.2 kg   SpO2 97%   BMI 33.66 kg/m   Physical Exam Constitutional:      General: She is not in acute distress.    Appearance: Normal appearance. She is well-developed. She is not ill-appearing or diaphoretic.  HENT:     Head: Normocephalic and atraumatic. No raccoon eyes, Battle's sign or contusion.     Jaw: There is normal jaw occlusion.     Right Ear: External ear normal.     Left Ear: External ear normal.     Nose: Nose normal.  Eyes:     General: Vision grossly intact. Gaze aligned appropriately.     Pupils: Pupils are equal, round, and reactive to light.  Neck:     Trachea: Trachea and phonation normal. No tracheal deviation.  Cardiovascular:     Pulses:          Dorsalis pedis pulses are 2+ on the right side and 2+ on the left side.  Pulmonary:     Effort: Pulmonary effort is normal.  No respiratory distress.  Abdominal:     General: There is no distension.     Palpations: Abdomen is soft.     Tenderness: There is no abdominal tenderness. There is no guarding or rebound.  Musculoskeletal:  General: Normal range of motion.     Cervical back: Normal range of motion.     Comments: No midline C/T/L spinal tenderness to palpation, no paraspinal muscle tenderness, no deformity, crepitus, or step-off noted. No sign of injury to the neck or back. - No pelvic tenderness or instability, no pain with range of motion at the hips.  Right knee nontender without evidence of injury.  Right ankle with bilateral malleoli or tenderness to palpation moderate swelling, no skin break.  Sensation and capillary refill intact to all toes, strong and equal pedal pulses.  Range of motion of ankle decreased secondary to pain.  Feet:     Right foot:     Protective Sensation: 5 sites tested. 5 sites sensed.     Skin integrity: Skin integrity normal.     Left foot:     Protective Sensation: 5 sites tested. 5 sites sensed.     Skin integrity: Skin integrity normal.  Skin:    General: Skin is warm and dry.  Neurological:     Mental Status: She is alert.     GCS: GCS eye subscore is 4. GCS verbal subscore is 5. GCS motor subscore is 6.     Comments: Speech is clear and goal oriented, follows commands Major Cranial nerves without deficit, no facial droop Moves extremities without ataxia, coordination intact  Psychiatric:        Behavior: Behavior normal.     ED Results / Procedures / Treatments   Labs (all labs ordered are listed, but only abnormal results are displayed) Labs Reviewed - No data to display  EKG None  Radiology DG Ankle 2 Views Right  Result Date: 04/05/2020 CLINICAL DATA:  Post reduction EXAM: RIGHT ANKLE - 2 VIEW COMPARISON:  04/05/2020 FINDINGS: Casting material limits bone detail. Residual lateral subluxation of the talar dome with respect to the distal tibia. Acute  displaced medial malleolar fracture. Decreased displacement of distal fibular fracture. No significant displacement of posterior malleolar fracture fragments. Possible tiny anterior articular fracture off the distal tibia. IMPRESSION: 1. Residual lateral subluxation of the talar dome with respect to the distal tibia. 2. Displaced medial malleolar fracture. Decreased displacement of distal fibular fracture. No significant displacement of posterior malleolar fracture. 3. Possible small anterior articular surface fracture fragments off the distal tibia. Electronically Signed   By: Donavan Foil M.D.   On: 04/05/2020 18:12   DG Ankle Complete Right  Result Date: 04/05/2020 CLINICAL DATA:  Right ankle pain after fall EXAM: RIGHT ANKLE - COMPLETE 3+ VIEW COMPARISON:  08/26/2012 FINDINGS: Acute trimalleolar fracture of the right ankle including a mildly displaced transversely oriented fracture of the medial malleolus with 5 mm of lateral displacement. Oblique fractures through the distal fibular metaphysis extending into the ankle mortise with up to 5 mm of lateral displacement. Small minimally displaced vertically oriented fracture of the posterior malleolus. Talar dome is laterally subluxed relative to the tibial plafond without dislocation. Circumferential soft tissue swelling about the ankle. Alignment of the subtalar joints and midfoot appears anatomic. IMPRESSION: Acute trimalleolar fracture of the right ankle with lateral subluxation of the talar dome relative to the tibial plafond. Electronically Signed   By: Davina Poke D.O.   On: 04/05/2020 14:07    Procedures .Ortho Injury Treatment  Date/Time: 04/05/2020 6:50 PM Performed by: Deliah Boston, PA-C Authorized by: Deliah Boston, PA-C   Consent:    Consent obtained:  Verbal   Consent given by:  Patient   Risks  discussed:  Fracture, irreducible dislocation, recurrent dislocation, nerve damage, stiffness, restricted joint movement and  vascular damageInjury location: ankle Location details: right ankle Injury type: fracture-dislocation Fracture type: trimalleolar Pre-procedure neurovascular assessment: neurovascularly intact Pre-procedure distal perfusion: normal Pre-procedure neurological function: normal Pre-procedure range of motion: reduced  Anesthesia: Local anesthesia used: no  Patient sedated: NoManipulation performed: yes Skeletal traction used: yes Reduction successful: yes (Improvement of fibular fracture) X-ray confirmed reduction: yes Immobilization: splint Splint type: short leg and ankle stirrup Post-procedure neurovascular assessment: post-procedure neurovascularly intact Post-procedure distal perfusion: normal Post-procedure neurological function: normal Patient tolerance: patient tolerated the procedure well with no immediate complications Comments: Patient tolerated procedure well, reported pain was minimal, refused additional pain medication.    (including critical care time)  Medications Ordered in ED Medications  oxyCODONE-acetaminophen (PERCOCET/ROXICET) 5-325 MG per tablet 2 tablet (2 tablets Oral Given 04/05/20 1554)    ED Course  I have reviewed the triage vital signs and the nursing notes.  Pertinent labs & imaging results that were available during my care of the patient were reviewed by me and considered in my medical decision making (see chart for details).  Clinical Course as of Apr 06 1851  Tue Apr 05, 2020  1630 Dr. Stann Mainland to call back   [BM]  [place] Dr. Stann Mainland   [BM]    Clinical Course User Index [BM] Gari Crown   MDM Rules/Calculators/A&P                     DG right ankle: IMPRESSION:  Acute trimalleolar fracture of the right ankle with lateral  subluxation of the talar dome relative to the tibial plafond.  I personally reviewed patient's x-ray and agree with radiologist interpretation.  Patient seen and evaluated by Dr. Andria Frames, plan of care is to  consult Ortho, anticipate reduction and discharge. - Patient neurovascular intact to the right lower extremity, twisting her ankle today while walking down a hill.  No head injury, neck pain, back pain, chest pain or abdominal pelvic pain, no injury of the other 3 extremities.  Her only concern today is the right ankle.  Patient given 2 Percocets for pain control.  Consult was called to Dr. Stann Mainland who advised reduction and splinting in the ER today.  Patient did not require any sedation, she tolerated procedure well, see procedure note above.  Post procedure she is neurovascular intact.  Postreduction x-rays were obtained.  DG right ankle:  IMPRESSION:  1. Residual lateral subluxation of the talar dome with respect to  the distal tibia.  2. Displaced medial malleolar fracture. Decreased displacement of  distal fibular fracture. No significant displacement of posterior  malleolar fracture.  3. Possible small anterior articular surface fracture fragments off  the distal tibia.   I then called Dr. Stann Mainland back, reviewed x-ray read with him.  Dr. Stann Mainland stated that there is no need to redo reduction today, patient can follow-up as outpatient. - Patient given crutches, nonweightbearing, encouraged to use rice therapy at home and to call Dr. Stann Mainland tomorrow morning to schedule follow-up appointment.  She has a family member driving her home today.  Patient prescribed 6 pills Norco for pain due to acute fracture.  We discussed narcotic precautions this patient stated understanding. PMD P reviewed patient without recent narcotic prescriptions.  At this time there does not appear to be any evidence of an acute emergency medical condition and the patient appears stable for discharge with appropriate outpatient follow up. Diagnosis was  discussed with patient who verbalizes understanding of care plan and is agreeable to discharge. I have discussed return precautions with patient who verbalizes understanding.  Patient encouraged to follow-up with their PCP and Ortho. All questions answered.  Patient's case discussed with Dr. Karle Starch who agrees with plan to discharge with follow-up.   Note: Portions of this report may have been transcribed using voice recognition software. Every effort was made to ensure accuracy; however, inadvertent computerized transcription errors may still be present. Final Clinical Impression(s) / ED Diagnoses Final diagnoses:  Closed trimalleolar fracture of right ankle, initial encounter    Rx / DC Orders ED Discharge Orders         Ordered    HYDROcodone-acetaminophen (NORCO/VICODIN) 5-325 MG tablet  Every 6 hours PRN     04/05/20 1845           Deliah Boston, PA-C 04/05/20 1852    Deliah Boston, PA-C 04/05/20 1903    Lajean Saver, MD 04/06/20 1438

## 2020-04-07 ENCOUNTER — Ambulatory Visit (HOSPITAL_COMMUNITY)
Admission: RE | Admit: 2020-04-07 | Discharge: 2020-04-07 | Disposition: A | Discharge status: Home / Self Care | Payer: PPO | Source: Ambulatory Visit | Attending: Orthopedic Surgery | Admitting: Orthopedic Surgery

## 2020-04-07 ENCOUNTER — Ambulatory Visit (INDEPENDENT_AMBULATORY_CARE_PROVIDER_SITE_OTHER): Payer: PPO | Admitting: Orthopedic Surgery

## 2020-04-07 ENCOUNTER — Encounter: Payer: Self-pay | Admitting: Orthopedic Surgery

## 2020-04-07 ENCOUNTER — Other Ambulatory Visit: Payer: Self-pay

## 2020-04-07 VITALS — BP 156/100 | HR 90 | Ht 63.0 in | Wt 190.0 lb

## 2020-04-07 DIAGNOSIS — S82899A Other fracture of unspecified lower leg, initial encounter for closed fracture: Secondary | ICD-10-CM

## 2020-04-07 DIAGNOSIS — S82851A Displaced trimalleolar fracture of right lower leg, initial encounter for closed fracture: Secondary | ICD-10-CM | POA: Diagnosis not present

## 2020-04-07 NOTE — Progress Notes (Signed)
EMERGENCY ROOM FOLLOW UP  NEW PROBLEM/PATIENT   Patient ID: CATELYN KASZUBA, female   DOB: Apr 07, 1951, 69 y.o.   MRN: [id number]  Emergency room record from (date) May 4 has been reviewed and this is included by reference and includes the review of systems with the following addition:   Chief Complaint  Patient presents with  . Ankle Injury    04/05/20 right ankle injury     HPI HAMDI DEFRANCIS is a 69 y.o. female.  Presents for evaluation of a trimalleolar ankle fracture  She is 69 years old she is retired she has diabetes and hypertension she had rectal cancer in the past which a callus is some occasional diarrhea but she presents on this occasion after slipping falling and injuring her left ankle.  She had a closed reduction in the emergency room and she was placed in appropriate splint although it is bothering her  Primary complaint is pain and inability to weight-bear   Review of Systems Review of Systems  Gastrointestinal: Positive for diarrhea.  All other systems reviewed and are negative.    has a past medical history of Anemia, Anxiety, Complication of anesthesia, Depression, Diabetes mellitus, Heart murmur, History of rectal cancer (10/04/2011), Hypertension, Overactive bladder, Paraspinal mass (12/29/2014), Rectal cancer ([place]) (dx'd 11/16/09), and Tubular adenoma of colon ([id number]).   Past Surgical History:  Procedure Laterality Date  . ABDOMINAL HYSTERECTOMY     for uterine fibroids  . COLON SURGERY N/A 04/24/2010   "took out half rectum"; rectal cancer  . EYE SURGERY     lasik surgery  . HAND SURGERY     carpal tunnel- left  . [place]  . PORT-A-CATH REMOVAL N/A 02/17/2019   Procedure: REMOVAL PORT-A-CATH;  Surgeon: Coralie Keens, MD;  Location: [facility];  Service: General;  Laterality: N/A;  . PORTACATH PLACEMENT     2011  . RECTAL SURGERY     2010  . WISDOM TOOTH EXTRACTION      Family History  Problem Relation Age of Onset  .  Leukemia Father 72  . Colon cancer Neg Hx   . Esophageal cancer Neg Hx   . Stomach cancer Neg Hx   . Rectal cancer Neg Hx     Social History Social History   Tobacco Use  . Smoking status: Former Smoker    Packs/day: 0.50    Years: 35.00    Pack years: 17.50    Types: Cigarettes, Cigars    Quit date: 12/03/2009    Years since quitting: 10.3  . Smokeless tobacco: Never Used  Substance Use Topics  . Alcohol use: Yes    Comment: socially  . Drug use: No    Allergies  Allergen Reactions  . Advanced Hand Sanitizer [Alcohol] Dermatitis  . Norgesic [Orphenadrine-Aspirin-Caffeine] Hives  . Antibacterial Hand Soap [Triclosan] Dermatitis    Current Outpatient Medications  Medication Sig Dispense Refill  . amLODipine (NORVASC) 10 MG tablet Take 10 mg by mouth at bedtime.     Marland Kitchen aspirin EC 81 MG tablet Take 81 mg by mouth at bedtime.     . docusate sodium (COLACE) 100 MG capsule Take 100-200 mg by mouth daily as needed for mild constipation.    . empagliflozin (JARDIANCE) 25 MG TABS tablet Take 25 mg by mouth daily with supper.     Marland Kitchen HYDROcodone-acetaminophen (NORCO/VICODIN) 5-325 MG tablet Take 1-2 tablets by mouth every 6 (six) hours as needed. 6 tablet 0  . latanoprost (XALATAN) 0.005 %  ophthalmic solution Place 1 drop into both eyes at bedtime.     Marland Kitchen losartan (COZAAR) 50 MG tablet Take 50 mg by mouth daily.    . Menthol, Topical Analgesic, 16 % LIQD Apply 1 application topically 2 (two) times daily as needed (pain).    . methylphenidate 36 MG PO CR tablet Take 36 mg by mouth daily with breakfast.    . Propylene Glycol 0.6 % SOLN Place 1 drop into both eyes 3 (three) times daily.     . Semaglutide (OZEMPIC, 1 MG/DOSE, [place]) Inject into the skin.    Marland Kitchen vortioxetine HBr (TRINTELLIX) 10 MG TABS tablet Take 10 mg by mouth daily with supper.     . meloxicam (MOBIC) 7.5 MG tablet Take 7.5 mg by mouth 2 (two) times daily as needed for pain.     . mirabegron ER (MYRBETRIQ) 25 MG TB24 tablet  Take 25 mg by mouth daily.    . traMADol (ULTRAM) 50 MG tablet Take 1 tablet (50 mg total) by mouth every 6 (six) hours as needed for moderate pain or severe pain. (Patient not taking: Reported on 04/07/2020) 25 tablet 0   No current facility-administered medications for this visit.    Physical Exam BP (!) 156/100   Pulse 90   Ht 5\' 3"  (1.6 m)   Wt 190 lb (86.2 kg)   BMI 33.66 kg/m  Body mass index is 33.66 kg/m.  Well developed and well nourished  Unable to ambulate alert and oriented x 3  Normal affect and mood  Ortho Exam  Upper extremities no contracture subluxation atrophy or tremor  Left lower extremity normal  Right ankle tenderness globally swelling globally neurovascular exam intact Data Reviewed IMAGING From THE ER AND THE REPORT ARE REVIEWED, MY INTERPRETATION OF THE IMAGE(S) IS :  Initial hospital films show what appears to be trimalleolar ankle fracture with horizontal medial malleolar fracture oblique fibular fracture and a posterior lip fracture  Post reduction film shows same Assessment  Trimalleolar ankle fracture  Plan   CT scan to plan surgery  Patient explained problems with diabetic ankle fractures in terms of healing of bone and soft tissue and possibility of amputation  Voiced understanding  Follow-up after CT to plan surgery  Splint had to be applied in the office because the other one was not fitting and was pinching the skin Arther Abbott, MD 04/07/2020 4:19 PM

## 2020-04-07 NOTE — Patient Instructions (Addendum)
Surgery planned for Monday with a follow-up visit planned for May 24   Displaced Trimalleolar Ankle Fracture Treated With ORIF A trimalleolar fracture involves three breaks (fractures) in the lower leg bones that help to form the ankle. These fractures are in:  The bottom end of the bone that you feel as the bump on the outer side of your ankle (fibula).  The bottom end of the bone that you feel as the bump on the inner side of your ankle (tibia). Open reduction with internal fixation (ORIF) is a surgical procedure that may be used to treat a trimalleolar fracture. You may need this surgery if your fracture is displaced, which means that the bones are not lined up correctly. During this procedure, a surgeon will move the bones back into the correct positions. Then, the surgeon will put in a combination of screws, metal plates, or different types of wiring to hold the bones in place. Tell a health care provider about:  Any allergies you have.  All medicines you are taking, including vitamins, herbs, eye drops, creams, and over-the-counter medicines.  Any problems you or family members have had with anesthetic medicines.  Any blood disorders you have.  Any surgeries you have had.  Any medical conditions you have.  Whether you are pregnant or may be pregnant. What are the risks? Generally, this is a safe procedure. However, problems may occur, including:  Excessive bleeding.  Infection.  Allergic reactions to medicines.  Damage to nerves or blood vessels.  Failure of the fracture to heal.  Long-term pain and stiffness (arthritis).  Stiffness in the ankle after the repair. What happens before the procedure? Staying hydrated Follow instructions from your health care provider about hydration, which may include:  Up to 2 hours before the procedure - you may continue to drink clear liquids, such as water, clear fruit juice, black coffee, and plain tea. Eating and drinking  restrictions Follow instructions from your health care provider about eating and drinking, which may include:  8 hours before the procedure - stop eating heavy meals or foods such as meat, fried foods, or fatty foods.  6 hours before the procedure - stop eating light meals or foods, such as toast or cereal.  6 hours before the procedure - stop drinking milk or drinks that contain milk.  2 hours before the procedure - stop drinking clear liquids. Medicines  Ask your health care provider about: ? Changing or stopping your regular medicines. This is especially important if you are taking diabetes medicines or blood thinners. ? Taking medicines such as aspirin and ibuprofen. These medicines can thin your blood. Do not take these medicines unless your health care provider tells you to take them. ? Taking over-the-counter medicines, vitamins, herbs, and supplements.  You may be given antibiotic medicine to help prevent infection. General instructions  Plan to have someone take you home from the hospital or clinic.  Plan to have a responsible adult care for you for at least 24 hours after you leave the hospital or clinic. This is important.  Ask your health care provider how your surgical site will be marked or identified.  You may be asked to shower with a germ-killing soap. What happens during the procedure?  To lower your risk of infection: ? Your health care team will wash or sanitize their hands. ? Hair may be removed from the surgical area. ? Your skin will be washed with soap.  An IV will be inserted into one of  your veins. You may be given antibiotic medicine and medicine for pain through the IV.  You will be given one or more of the following: ? A medicine to numb the area (local anesthetic). ? A medicine to make you fall asleep (general anesthetic). ? A medicine that is injected into your spine to numb the area below and slightly above the injection site (spinal  anesthetic). ? A medicine that is injected into an area of your body to numb everything below the injection site (regional anesthetic).  The surgeon will make an incision through your skin over the area of the fractures.  The broken bones will be put back into their normal positions. The surgeon will use a combination of screws, metal plates, or different types of wiring to hold the bones in place.  After the bones are back in place, the surgeon will close the incision using stitches (sutures) or staples.  A bandage (dressing) and a splint, cast, or supportive boot will be placed over your ankle. The procedure may vary among health care providers and hospitals. What happens after the procedure?  Your blood pressure, heart rate, breathing rate, and blood oxygen level will be monitored until the medicines you were given have worn off.  You will be given medicine for pain as needed.  You may be given instructions about how much body weight you can or cannot safely support (bear) on your injured foot (weight-bearing restrictions). You may be given crutches, a cane, or a walker to help you move around so that you do not bear any weight on your foot.  While in the hospital, you will be helped out of bed so you can begin moving around. This is important to improve blood flow and breathing.  Do not drive for 24 hours if you were given a medicine to help you relax (sedative) during your procedure. Summary  A trimalleolar fracture involves three breaks (fractures) in the lower leg bones (fibula and tibia) that help to form the ankle.  During ORIF surgery, the surgeon will move the bones back into the correct positions and use screws, metal plates, or wires to hold the bones in place.  After the incision is closed, a splint, cast, or supportive boot will be placed over the ankle.  Plan to have someone take you home after the procedure. Also, plan to have a responsible adult care for you for at  least 24 hours after you leave the hospital or clinic. This information is not intended to replace advice given to you by your health care provider. Make sure you discuss any questions you have with your health care provider. Document Revised: 11/01/2017 Document Reviewed: 09/03/2017 [employer] Patient Education  2020 [employer].

## 2020-04-08 ENCOUNTER — Other Ambulatory Visit (HOSPITAL_COMMUNITY)
Admission: RE | Admit: 2020-04-08 | Discharge: 2020-04-08 | Disposition: A | Discharge status: Home / Self Care | Payer: PPO | Source: Ambulatory Visit | Attending: Orthopedic Surgery | Admitting: Orthopedic Surgery

## 2020-04-08 ENCOUNTER — Encounter (HOSPITAL_COMMUNITY): Payer: Self-pay

## 2020-04-08 ENCOUNTER — Telehealth: Payer: Self-pay | Admitting: Orthopedic Surgery

## 2020-04-08 ENCOUNTER — Other Ambulatory Visit: Payer: Self-pay | Admitting: Orthopedic Surgery

## 2020-04-08 ENCOUNTER — Encounter (HOSPITAL_COMMUNITY)
Admission: RE | Admit: 2020-04-08 | Discharge: 2020-04-08 | Disposition: A | Discharge status: Home / Self Care | Payer: PPO | Source: Ambulatory Visit | Attending: Orthopedic Surgery | Admitting: Orthopedic Surgery

## 2020-04-08 ENCOUNTER — Other Ambulatory Visit: Payer: Self-pay

## 2020-04-08 DIAGNOSIS — Z01812 Encounter for preprocedural laboratory examination: Secondary | ICD-10-CM | POA: Insufficient documentation

## 2020-04-08 DIAGNOSIS — Z20822 Contact with and (suspected) exposure to covid-19: Secondary | ICD-10-CM | POA: Insufficient documentation

## 2020-04-08 DIAGNOSIS — S82851A Displaced trimalleolar fracture of right lower leg, initial encounter for closed fracture: Secondary | ICD-10-CM

## 2020-04-08 LAB — CBC WITH DIFFERENTIAL/PLATELET
Abs Immature Granulocytes: 0.01 10*3/uL (ref 0.00–0.07)
Basophils Absolute: 0.1 10*3/uL (ref 0.0–0.1)
Basophils Relative: 1 %
Eosinophils Absolute: 0.1 10*3/uL (ref 0.0–0.5)
Eosinophils Relative: 2 %
HCT: 42.3 % (ref 36.0–46.0)
Hemoglobin: 12.8 g/dL (ref 12.0–15.0)
Immature Granulocytes: 0 %
Lymphocytes Relative: 28 %
Lymphs Abs: 1.6 10*3/uL (ref 0.7–4.0)
MCH: 26 pg (ref 26.0–34.0)
MCHC: 30.3 g/dL (ref 30.0–36.0)
MCV: 85.8 fL (ref 80.0–100.0)
Monocytes Absolute: 0.7 10*3/uL (ref 0.1–1.0)
Monocytes Relative: 12 %
Neutro Abs: 3.3 10*3/uL (ref 1.7–7.7)
Neutrophils Relative %: 57 %
Platelets: 339 10*3/uL (ref 150–400)
RBC: 4.93 MIL/uL (ref 3.87–5.11)
RDW: 16.8 % — ABNORMAL HIGH (ref 11.5–15.5)
WBC: 5.7 10*3/uL (ref 4.0–10.5)
nRBC: 0 % (ref 0.0–0.2)

## 2020-04-08 LAB — BASIC METABOLIC PANEL
Anion gap: 9 (ref 5–15)
BUN: 10 mg/dL (ref 8–23)
CO2: 24 mmol/L (ref 22–32)
Calcium: 8.9 mg/dL (ref 8.9–10.3)
Chloride: 106 mmol/L (ref 98–111)
Creatinine, Ser: 1.08 mg/dL — ABNORMAL HIGH (ref 0.44–1.00)
GFR calc Af Amer: >60 mL/min (ref >60)
GFR calc non Af Amer: 53 mL/min — ABNORMAL LOW (ref >60)
Glucose, Bld: 135 mg/dL — ABNORMAL HIGH (ref 70–99)
Potassium: 3.2 mmol/L — ABNORMAL LOW (ref 3.5–5.1)
Sodium: 139 mmol/L (ref 135–145)

## 2020-04-08 LAB — GLUCOSE, CAPILLARY: Glucose-Capillary: 141 mg/dL — ABNORMAL HIGH (ref 70–99)

## 2020-04-08 LAB — HEMOGLOBIN A1C
Hgb A1c MFr Bld: 7.2 % — ABNORMAL HIGH (ref 4.8–5.6)
Mean Plasma Glucose: 159.94 mg/dL

## 2020-04-08 MED ORDER — HYDROCODONE-ACETAMINOPHEN 5-325 MG PO TABS: 1.0000 | ORAL_TABLET | ORAL | 0 refills | Status: DC | PRN

## 2020-04-08 NOTE — Telephone Encounter (Signed)
Notified patient - left message that prescription was sent per Dr Aline Brochure; chart note indicates e-script 9:57am

## 2020-04-08 NOTE — Telephone Encounter (Signed)
She asked for refill on Hydrocodone It was on paperwork It did not get sent in for her Sending request now to Dr Lemmie Evens

## 2020-04-08 NOTE — Telephone Encounter (Signed)
Thank you. Patient aware. Also states has only "1 pill left from the emergency room doctor"; said "not a seeker, but having pain."  Appreciated our response.  ([place] in [place])

## 2020-04-08 NOTE — Telephone Encounter (Signed)
Tonya Vincent was called with the results of her CT scan.  The fracture fragments posteriorly are small less than 25% of the joint space and do not involve any of the major weightbearing surface of the distal tibial plafond  Medial lateral fixation only with probable syndesmosis fixation due to diabetes

## 2020-04-08 NOTE — Patient Instructions (Signed)
Tonya Vincent  04/08/2020     @PREFPERIOPPHARMACY @   Your procedure is scheduled on Monday, May 10.  Report to [facility] [place] [facility]  Call this number if you have problems the morning of surgery:  [phone]   Remember:  Do not eat or drink after midnight.      Take these medicines the morning of surgery with A SIP OF WATER amlodipine and losartan. Norco if needed    Do not wear jewelry, make-up or nail polish.  Do not wear lotions, powders, or perfumes, or deodorant.  Do not shave 48 hours prior to surgery.  Men may shave face and neck.  Do not bring valuables to the hospital.  [facility] is not responsible for any belongings or valuables.  Contacts, dentures or bridgework may not be worn into surgery.  Leave your suitcase in the car.  After surgery it may be brought to your room.  For patients admitted to the hospital, discharge time will be determined by your treatment team.  Patients discharged the day of surgery will not be allowed to drive home.   Name and phone number of your driver:   family Special instructions:    Please read over the following fact sheets that you were given. Pain Booklet, Coughing and Deep Breathing, Surgical Site Infection Prevention, Anesthesia Post-op Instructions and Care and Recovery After Surgery      Displaced Bimalleolar Ankle Fracture Treated With ORIF A bimalleolar fracture is two breaks (fractures) in the lower bones of the leg that help to form the ankle. These fractures are in:  The bottom end of the bone that you feel as the bump on the outer side of your ankle (fibula).  The bottom end of the bone that you feel as the bump on the inner side of your ankle (tibia). Open reduction with internal fixation (ORIF) is a surgical procedure that may be used to treat a bimalleolar fracture. You may need this surgery if your fracture is displaced, which means that the bones are not lined up correctly. During ORIF, a surgeon will  move the bones back into the right position. The surgeon will put in a combination of screws, metal plates, or different types of wiring to hold the bones in place. Tell a health care provider about:  Any allergies you have.  All medicines you are taking, including vitamins, herbs, eye drops, creams, and over-the-counter medicines.  Any problems you or family members have had with anesthetic medicines.  Any blood disorders you have.  Any surgeries you have had.  Any medical conditions you have.  Whether you are pregnant or may be pregnant. What are the risks? Generally, this is a safe procedure. However, problems may occur, including:  Excessive bleeding.  Infection.  Allergic reactions to medicines.  Damage to nerves or blood vessels.  Failure of the fracture to heal.  Long-term pain and stiffness (arthritis).  Stiffness in the ankle after the repair. What happens before the procedure? Staying hydrated Follow instructions from your health care provider about hydration, which may include:  Up to 2 hours before the procedure - you may continue to drink clear liquids, such as water, clear fruit juice, black coffee, and plain tea. Eating and drinking restrictions Follow instructions from your health care provider about eating and drinking, which may include:  8 hours before the procedure - stop eating heavy meals or foods such as meat, fried foods, or fatty foods.  6 hours before the  procedure - stop eating light meals or foods, such as toast or cereal.  6 hours before the procedure - stop drinking milk or drinks that contain milk.  2 hours before the procedure - stop drinking clear liquids. Medicines  Ask your health care provider about: ? Changing or stopping your regular medicines. This is especially important if you are taking diabetes medicines or blood thinners. ? Taking medicines such as aspirin and ibuprofen. These medicines can thin your blood. Do not take  these medicines unless your health care provider tells you to take them. ? Taking over-the-counter medicines, vitamins, herbs, and supplements.  You may be given antibiotic medicine to help prevent infection. General instructions  Plan to have someone take you home from the hospital or clinic.  Plan to have a responsible adult care for you for at least 24 hours after you leave the hospital or clinic. This is important.  Ask your health care provider how your surgical site will be marked or identified.  You may be asked to shower with a germ-killing soap. What happens during the procedure?  To lower your risk of infection: ? Your health care team will wash or sanitize their hands. ? Hair may be removed from the surgical area. ? Your skin will be washed with soap.  An IV will be inserted into one of your veins. You may be given antibiotic medicine and medicine for pain through the IV.  You will be given one or more of the following: ? A medicine to numb the area (local anesthetic). ? A medicine to make you fall asleep (general anesthetic). ? A medicine that is injected into your spine to numb the area below and slightly above the injection site (spinal anesthetic). ? A medicine that is injected into an area of your body to numb everything below the injection site (regional anesthetic).  The surgeon will make an incision through your skin over the area of the fractures.  The broken bones will be put back into their normal positions. The surgeon will use a combination of screws, metal plates, or different types of wiring to hold the bones in place.  After the bones are back in place, the surgeon will close the incision using stitches (sutures) or staples.  A bandage (dressing) and a splint, cast, or supportive boot will be placed over your ankle. The procedure may vary among health care providers and hospitals. What happens after the procedure?  Your blood pressure, heart rate,  breathing rate, and blood oxygen level will be monitored until the medicines you were given have worn off.  You will be given medicine for pain as needed.  You may be given instructions about how much body weight you can or cannot safely support (bear) on your injured foot (weight-bearing restrictions). You may be given crutches, a cane, or a walker to help you move around so that you do not bear any weight on your foot.  While in the hospital, you will be helped out of bed so you can begin moving around. This is important to improve blood flow and breathing.  Do not drive for 24 hours if you were given a medicine to help you relax (sedative) during your procedure. Summary  A bimalleolar fracture is two breaks (fractures) in the lower bones of the leg (fibula and tibia) that help to form the ankle.  To repair the fractures with ORIF surgery, the surgeon will make an incision over the bones and move them back into place.  A combination of screws, metal plates, or wires will be used to permanently hold the bones in place.  Plan to have someone take you home after the procedure. Also, plan to have a responsible adult care for you for at least 24 hours after you leave the hospital or clinic. This information is not intended to replace advice given to you by your health care provider. Make sure you discuss any questions you have with your health care provider. Document Revised: 11/01/2017 Document Reviewed: 08/30/2017 Elsevier Patient Education  2020 [place] Anesthesia, Adult General anesthesia is the use of medicines to make a person "go to sleep" (unconscious) for a medical procedure. General anesthesia must be used for certain procedures, and is often recommended for procedures that:  Last a long time.  Require you to be still or in an unusual position.  Are major and can cause blood loss. The medicines used for general anesthesia are called general anesthetics. As well  as making you unconscious for a certain amount of time, these medicines:  Prevent pain.  Control your blood pressure.  Relax your muscles. Tell a health care provider about:  Any allergies you have.  All medicines you are taking, including vitamins, herbs, eye drops, creams, and over-the-counter medicines.  Any problems you or family members have had with anesthetic medicines.  Types of anesthetics you have had in the past.  Any blood disorders you have.  Any surgeries you have had.  Any medical conditions you have.  Any recent upper respiratory, chest, or ear infections.  Any history of: ? Heart or lung conditions, such as heart failure, sleep apnea, asthma, or chronic obstructive pulmonary disease (COPD). ? [employer] forces logistics/support/administrative officer. ? Depression or anxiety.  Any tobacco or drug use, including marijuana or alcohol use.  Whether you are pregnant or may be pregnant. What are the risks? Generally, this is a safe procedure. However, problems may occur, including:  Allergic reaction.  Lung and heart problems.  Inhaling food or liquid from the stomach into the lungs (aspiration).  Nerve injury.  Dental injury.  Air in the bloodstream, which can lead to stroke.  Extreme agitation or confusion (delirium) when you wake up from the anesthetic.  Waking up during your procedure and being unable to move. This is rare. These problems are more likely to develop if you are having a major surgery or if you have an advanced or serious medical condition. You can prevent some of these complications by answering all of your health care provider's questions thoroughly and by following all instructions before your procedure. General anesthesia can cause side effects, including:  Nausea or vomiting.  A sore throat from the breathing tube.  Hoarseness.  Wheezing or coughing.  Shaking chills.  Tiredness.  Body aches.  Anxiety.  Sleepiness or drowsiness.  Confusion or  agitation. What happens before the procedure? Staying hydrated Follow instructions from your health care provider about hydration, which may include:  Up to 2 hours before the procedure - you may continue to drink clear liquids, such as water, clear fruit juice, black coffee, and plain tea.  Eating and drinking restrictions Follow instructions from your health care provider about eating and drinking, which may include:  8 hours before the procedure - stop eating heavy meals or foods such as meat, fried foods, or fatty foods.  6 hours before the procedure - stop eating light meals or foods, such as toast or cereal.  6 hours before the procedure - stop  drinking milk or drinks that contain milk.  2 hours before the procedure - stop drinking clear liquids. Medicines Ask your health care provider about:  Changing or stopping your regular medicines. This is especially important if you are taking diabetes medicines or blood thinners.  Taking medicines such as aspirin and ibuprofen. These medicines can thin your blood. Do not take these medicines unless your health care provider tells you to take them.  Taking over-the-counter medicines, vitamins, herbs, and supplements. Do not take these during the week before your procedure unless your health care provider approves them. General instructions  Starting 3-6 weeks before the procedure, do not use any products that contain nicotine or tobacco, such as cigarettes and e-cigarettes. If you need help quitting, ask your health care provider.  If you brush your teeth on the morning of the procedure, make sure to spit out all of the toothpaste.  Tell your health care provider if you become ill or develop a cold, cough, or fever.  If instructed by your health care provider, bring your sleep apnea device with you on the day of your surgery (if applicable).  Ask your health care provider if you will be going home the same day, the following day, or  after a longer hospital stay. ? Plan to have someone take you home from the hospital or clinic. ? Plan to have a responsible adult care for you for at least 24 hours after you leave the hospital or clinic. This is important. What happens during the procedure?   You will be given anesthetics through both of the following: ? A mask placed over your nose and mouth. ? An IV in one of your veins.  You may receive a medicine to help you relax (sedative).  After you are unconscious, a breathing tube may be inserted down your throat to help you breathe. This will be removed before you wake up.  An anesthesia specialist will stay with you throughout your procedure. He or she will: ? Keep you comfortable and safe by continuing to give you medicines and adjusting the amount of medicine that you get. ? Monitor your blood pressure, pulse, and oxygen levels to make sure that the anesthetics do not cause any problems. The procedure may vary among health care providers and hospitals. What happens after the procedure?  Your blood pressure, temperature, heart rate, breathing rate, and blood oxygen level will be monitored until the medicines you were given have worn off.  You will wake up in a recovery area. You may wake up slowly.  If you feel anxious or agitated, you may be given medicine to help you calm down.  If you will be going home the same day, your health care provider may check to make sure you can walk, drink, and urinate.  Your health care provider will treat any pain or side effects you have before you go home.  Do not drive for 24 hours if you were given a sedative. Summary  General anesthesia is used to keep you still and prevent pain during a procedure.  It is important to tell your health care provider about your medical history and any surgeries you have had, and previous experience with anesthesia.  Follow your health care provider's instructions about when to stop eating,  drinking, or taking certain medicines before your procedure.  Plan to have someone take you home from the hospital or clinic. This information is not intended to replace advice given to you by your health  care provider. Make sure you discuss any questions you have with your health care provider. Document Revised: 04/08/2018 Document Reviewed: 07/05/2017 Elsevier Patient Education  [place] Anesthesia, Adult, Care After This sheet gives you information about how to care for yourself after your procedure. Your health care provider may also give you more specific instructions. If you have problems or questions, contact your health care provider. What can I expect after the procedure? After the procedure, the following side effects are common:  Pain or discomfort at the IV site.  Nausea.  Vomiting.  Sore throat.  Trouble concentrating.  Feeling cold or chills.  Weak or tired.  Sleepiness and fatigue.  Soreness and body aches. These side effects can affect parts of the body that were not involved in surgery. Follow these instructions at home:  For at least 24 hours after the procedure:  Have a responsible adult stay with you. It is important to have someone help care for you until you are awake and alert.  Rest as needed.  Do not: ? Participate in activities in which you could fall or become injured. ? Drive. ? Use heavy machinery. ? Drink alcohol. ? Take sleeping pills or medicines that cause drowsiness. ? Make important decisions or sign legal documents. ? Take care of children on your own. Eating and drinking  Follow any instructions from your health care provider about eating or drinking restrictions.  When you feel hungry, start by eating small amounts of foods that are soft and easy to digest (bland), such as toast. Gradually return to your regular diet.  Drink enough fluid to keep your urine pale yellow.  If you vomit, rehydrate by drinking  water, juice, or clear broth. General instructions  If you have sleep apnea, surgery and certain medicines can increase your risk for breathing problems. Follow instructions from your health care provider about wearing your sleep device: ? Anytime you are sleeping, including during daytime naps. ? While taking prescription pain medicines, sleeping medicines, or medicines that make you drowsy.  Return to your normal activities as told by your health care provider. Ask your health care provider what activities are safe for you.  Take over-the-counter and prescription medicines only as told by your health care provider.  If you smoke, do not smoke without supervision.  Keep all follow-up visits as told by your health care provider. This is important. Contact a health care provider if:  You have nausea or vomiting that does not get better with medicine.  You cannot eat or drink without vomiting.  You have pain that does not get better with medicine.  You are unable to pass urine.  You develop a skin rash.  You have a fever.  You have redness around your IV site that gets worse. Get help right away if:  You have difficulty breathing.  You have chest pain.  You have blood in your urine or stool, or you vomit blood. Summary  After the procedure, it is common to have a sore throat or nausea. It is also common to feel tired.  Have a responsible adult stay with you for the first 24 hours after general anesthesia. It is important to have someone help care for you until you are awake and alert.  When you feel hungry, start by eating small amounts of foods that are soft and easy to digest (bland), such as toast. Gradually return to your regular diet.  Drink enough fluid to keep your urine pale  yellow.  Return to your normal activities as told by your health care provider. Ask your health care provider what activities are safe for you. This information is not intended to replace  advice given to you by your health care provider. Make sure you discuss any questions you have with your health care provider. Document Revised: 11/22/2017 Document Reviewed: 07/05/2017 Elsevier Patient Education  [place].  How to Use Chlorhexidine for Bathing Chlorhexidine gluconate (CHG) is a germ-killing (antiseptic) solution that is used to clean the skin. It can get rid of the bacteria that normally live on the skin and can keep them away for about 24 hours. To clean your skin with CHG, you may be given:  A CHG solution to use in the shower or as part of a sponge bath.  A prepackaged cloth that contains CHG. Cleaning your skin with CHG may help lower the risk for infection:  While you are staying in the intensive care unit of the hospital.  If you have a vascular access, such as a central line, to provide short-term or long-term access to your veins.  If you have a catheter to drain urine from your bladder.  If you are on a ventilator. A ventilator is a machine that helps you breathe by moving air in and out of your lungs.  After surgery. What are the risks? Risks of using CHG include:  A skin reaction.  Hearing loss, if CHG gets in your ears.  Eye injury, if CHG gets in your eyes and is not rinsed out.  The CHG product catching fire. Make sure that you avoid smoking and flames after applying CHG to your skin. Do not use CHG:  If you have a chlorhexidine allergy or have previously reacted to chlorhexidine.  On babies younger than 61 months of age. How to use CHG solution  Use CHG only as told by your health care provider, and follow the instructions on the label.  Use the full amount of CHG as directed. Usually, this is one bottle. During a shower Follow these steps when using CHG solution during a shower (unless your health care provider gives you different instructions): 1. Start the shower. 2. Use your normal soap and shampoo to wash your face and  hair. 3. Turn off the shower or move out of the shower stream. 4. Pour the CHG onto a clean washcloth. Do not use any type of brush or rough-edged sponge. 5. Starting at your neck, lather your body down to your toes. Make sure you follow these instructions: ? If you will be having surgery, pay special attention to the part of your body where you will be having surgery. Scrub this area for at least 1 minute. ? Do not use CHG on your head or face. If the solution gets into your ears or eyes, rinse them well with water. ? Avoid your genital area. ? Avoid any areas of skin that have broken skin, cuts, or scrapes. ? Scrub your back and under your arms. Make sure to wash skin folds. 6. Let the lather sit on your skin for 1-2 minutes or as long as told by your health care provider. 7. Thoroughly rinse your entire body in the shower. Make sure that all body creases and crevices are rinsed well. 8. Dry off with a clean towel. Do not put any substances on your body afterward--such as powder, lotion, or perfume--unless you are told to do so by your health care provider. Only use lotions  that are recommended by the manufacturer. 9. Put on clean clothes or pajamas. 10. If it is the night before your surgery, sleep in clean sheets.  During a sponge bath Follow these steps when using CHG solution during a sponge bath (unless your health care provider gives you different instructions): 1. Use your normal soap and shampoo to wash your face and hair. 2. Pour the CHG onto a clean washcloth. 3. Starting at your neck, lather your body down to your toes. Make sure you follow these instructions: ? If you will be having surgery, pay special attention to the part of your body where you will be having surgery. Scrub this area for at least 1 minute. ? Do not use CHG on your head or face. If the solution gets into your ears or eyes, rinse them well with water. ? Avoid your genital area. ? Avoid any areas of skin that  have broken skin, cuts, or scrapes. ? Scrub your back and under your arms. Make sure to wash skin folds. 4. Let the lather sit on your skin for 1-2 minutes or as long as told by your health care provider. 5. Using a different clean, wet washcloth, thoroughly rinse your entire body. Make sure that all body creases and crevices are rinsed well. 6. Dry off with a clean towel. Do not put any substances on your body afterward--such as powder, lotion, or perfume--unless you are told to do so by your health care provider. Only use lotions that are recommended by the manufacturer. 7. Put on clean clothes or pajamas. 8. If it is the night before your surgery, sleep in clean sheets. How to use CHG prepackaged cloths  Only use CHG cloths as told by your health care provider, and follow the instructions on the label.  Use the CHG cloth on clean, dry skin.  Do not use the CHG cloth on your head or face unless your health care provider tells you to.  When washing with the CHG cloth: ? Avoid your genital area. ? Avoid any areas of skin that have broken skin, cuts, or scrapes. Before surgery Follow these steps when using a CHG cloth to clean before surgery (unless your health care provider gives you different instructions): 1. Using the CHG cloth, vigorously scrub the part of your body where you will be having surgery. Scrub using a back-and-forth motion for 3 minutes. The area on your body should be completely wet with CHG when you are done scrubbing. 2. Do not rinse. Discard the cloth and let the area air-dry. Do not put any substances on the area afterward, such as powder, lotion, or perfume. 3. Put on clean clothes or pajamas. 4. If it is the night before your surgery, sleep in clean sheets.  For general bathing Follow these steps when using CHG cloths for general bathing (unless your health care provider gives you different instructions). 1. Use a separate CHG cloth for each area of your body. Make  sure you wash between any folds of skin and between your fingers and toes. Wash your body in the following order, switching to a new cloth after each step: ? The front of your neck, shoulders, and chest. ? Both of your arms, under your arms, and your hands. ? Your stomach and groin area, avoiding the genitals. ? Your right leg and foot. ? Your left leg and foot. ? The back of your neck, your back, and your buttocks. 2. Do not rinse. Discard the cloth and let  the area air-dry. Do not put any substances on your body afterward--such as powder, lotion, or perfume--unless you are told to do so by your health care provider. Only use lotions that are recommended by the manufacturer. 3. Put on clean clothes or pajamas. Contact a health care provider if:  Your skin gets irritated after scrubbing.  You have questions about using your solution or cloth. Get help right away if:  Your eyes become very red or swollen.  Your eyes itch badly.  Your skin itches badly and is red or swollen.  Your hearing changes.  You have trouble seeing.  You have swelling or tingling in your mouth or throat.  You have trouble breathing.  You swallow any chlorhexidine. Summary  Chlorhexidine gluconate (CHG) is a germ-killing (antiseptic) solution that is used to clean the skin. Cleaning your skin with CHG may help to lower your risk for infection.  You may be given CHG to use for bathing. It may be in a bottle or in a prepackaged cloth to use on your skin. Carefully follow your health care provider's instructions and the instructions on the product label.  Do not use CHG if you have a chlorhexidine allergy.  Contact your health care provider if your skin gets irritated after scrubbing. This information is not intended to replace advice given to you by your health care provider. Make sure you discuss any questions you have with your health care provider. Document Revised: 02/05/2019 Document Reviewed:  10/17/2017 Elsevier Patient Education  [place].

## 2020-04-08 NOTE — Progress Notes (Signed)
Meds ordered this encounter  Medications  . HYDROcodone-acetaminophen (NORCO/VICODIN) 5-325 MG tablet    Sig: Take 1 tablet by mouth every 4 (four) hours as needed for up to 3 days for moderate pain.    Dispense:  18 tablet    Refill:  0

## 2020-04-08 NOTE — Telephone Encounter (Signed)
Patient called, voice message, regarding a prescription she understood was to be ordered at time of her office visit; please advise.

## 2020-04-09 LAB — SARS CORONAVIRUS 2 (TAT 6-24 HRS): SARS Coronavirus 2: NEGATIVE

## 2020-04-11 ENCOUNTER — Encounter (HOSPITAL_COMMUNITY)
Admission: RE | Disposition: A | Discharge status: Home / Self Care | Payer: Self-pay | Source: Home / Self Care | Attending: Orthopedic Surgery

## 2020-04-11 ENCOUNTER — Encounter (HOSPITAL_COMMUNITY): Payer: Self-pay | Admitting: Orthopedic Surgery

## 2020-04-11 ENCOUNTER — Other Ambulatory Visit: Payer: Self-pay | Admitting: Orthopedic Surgery

## 2020-04-11 ENCOUNTER — Ambulatory Visit (HOSPITAL_COMMUNITY): Payer: PPO | Admitting: Anesthesiology

## 2020-04-11 ENCOUNTER — Ambulatory Visit (HOSPITAL_COMMUNITY)
Admission: RE | Admit: 2020-04-11 | Discharge: 2020-04-11 | Disposition: A | Discharge status: Home / Self Care | Payer: PPO | Attending: Orthopedic Surgery | Admitting: Orthopedic Surgery

## 2020-04-11 ENCOUNTER — Ambulatory Visit (HOSPITAL_COMMUNITY): Payer: PPO

## 2020-04-11 DIAGNOSIS — Z791 Long term (current) use of non-steroidal anti-inflammatories (NSAID): Secondary | ICD-10-CM | POA: Insufficient documentation

## 2020-04-11 DIAGNOSIS — F329 Major depressive disorder, single episode, unspecified: Secondary | ICD-10-CM | POA: Diagnosis not present

## 2020-04-11 DIAGNOSIS — I10 Essential (primary) hypertension: Secondary | ICD-10-CM | POA: Diagnosis not present

## 2020-04-11 DIAGNOSIS — Z79899 Other long term (current) drug therapy: Secondary | ICD-10-CM | POA: Insufficient documentation

## 2020-04-11 DIAGNOSIS — M84471D Pathological fracture, right ankle, subsequent encounter for fracture with routine healing: Secondary | ICD-10-CM | POA: Diagnosis not present

## 2020-04-11 DIAGNOSIS — Z419 Encounter for procedure for purposes other than remedying health state, unspecified: Secondary | ICD-10-CM

## 2020-04-11 DIAGNOSIS — W010XXA Fall on same level from slipping, tripping and stumbling without subsequent striking against object, initial encounter: Secondary | ICD-10-CM | POA: Diagnosis not present

## 2020-04-11 DIAGNOSIS — S82842A Displaced bimalleolar fracture of left lower leg, initial encounter for closed fracture: Secondary | ICD-10-CM | POA: Diagnosis not present

## 2020-04-11 DIAGNOSIS — Z87891 Personal history of nicotine dependence: Secondary | ICD-10-CM | POA: Insufficient documentation

## 2020-04-11 DIAGNOSIS — Z85048 Personal history of other malignant neoplasm of rectum, rectosigmoid junction, and anus: Secondary | ICD-10-CM | POA: Insufficient documentation

## 2020-04-11 DIAGNOSIS — S82851A Displaced trimalleolar fracture of right lower leg, initial encounter for closed fracture: Secondary | ICD-10-CM | POA: Diagnosis not present

## 2020-04-11 DIAGNOSIS — Z7984 Long term (current) use of oral hypoglycemic drugs: Secondary | ICD-10-CM | POA: Diagnosis not present

## 2020-04-11 DIAGNOSIS — F419 Anxiety disorder, unspecified: Secondary | ICD-10-CM | POA: Diagnosis not present

## 2020-04-11 DIAGNOSIS — G8918 Other acute postprocedural pain: Secondary | ICD-10-CM | POA: Diagnosis not present

## 2020-04-11 DIAGNOSIS — S82851D Displaced trimalleolar fracture of right lower leg, subsequent encounter for closed fracture with routine healing: Secondary | ICD-10-CM | POA: Diagnosis not present

## 2020-04-11 DIAGNOSIS — Z7982 Long term (current) use of aspirin: Secondary | ICD-10-CM | POA: Insufficient documentation

## 2020-04-11 DIAGNOSIS — E119 Type 2 diabetes mellitus without complications: Secondary | ICD-10-CM | POA: Diagnosis not present

## 2020-04-11 HISTORY — PX: ORIF ANKLE FRACTURE: SHX5408

## 2020-04-11 LAB — GLUCOSE, CAPILLARY
Glucose-Capillary: 109 mg/dL — ABNORMAL HIGH (ref 70–99)
Glucose-Capillary: 113 mg/dL — ABNORMAL HIGH (ref 70–99)

## 2020-04-11 SURGERY — OPEN REDUCTION INTERNAL FIXATION (ORIF) ANKLE FRACTURE
Anesthesia: General | Site: Ankle | Laterality: Right

## 2020-04-11 MED ORDER — ONDANSETRON HCL 4 MG/2ML IJ SOLN: 4.0000 mg | Freq: Once | INTRAMUSCULAR | Status: DC | PRN

## 2020-04-11 MED ORDER — PROPOFOL 10 MG/ML IV BOLUS
INTRAVENOUS | Status: AC
  Filled 2020-04-11: qty 20

## 2020-04-11 MED ORDER — LIDOCAINE HCL (PF) 1 % IJ SOLN
INTRAMUSCULAR | Status: AC
  Filled 2020-04-11: qty 30

## 2020-04-11 MED ORDER — GLYCOPYRROLATE 0.2 MG/ML IJ SOLN
INTRAMUSCULAR | Status: DC | PRN
  Administered 2020-04-11: .2 mg via INTRAVENOUS

## 2020-04-11 MED ORDER — BUPIVACAINE HCL (PF) 0.25 % IJ SOLN
INTRAMUSCULAR | Status: AC
  Filled 2020-04-11: qty 30

## 2020-04-11 MED ORDER — CEFAZOLIN SODIUM-DEXTROSE 2-4 GM/100ML-% IV SOLN
2.0000 g | INTRAVENOUS | Status: AC
  Administered 2020-04-11: 2 g via INTRAVENOUS

## 2020-04-11 MED ORDER — 0.9 % SODIUM CHLORIDE (POUR BTL) OPTIME
TOPICAL | Status: DC | PRN
  Administered 2020-04-11: 1000 mL

## 2020-04-11 MED ORDER — BUPIVACAINE HCL (PF) 0.25 % IJ SOLN
INTRAMUSCULAR | Status: DC | PRN
Start: 2020-04-11 — End: 2020-04-11

## 2020-04-11 MED ORDER — IBUPROFEN 800 MG PO TABS
800.0000 mg | ORAL_TABLET | Freq: Three times a day (TID) | ORAL | 1 refills | Status: DC | PRN
Start: 2020-04-11 — End: 2020-09-08

## 2020-04-11 MED ORDER — LACTATED RINGERS IV SOLN
Freq: Once | INTRAVENOUS | Status: AC
  Administered 2020-04-11: 11:00:00 1000 mL via INTRAVENOUS

## 2020-04-11 MED ORDER — DEXAMETHASONE SODIUM PHOSPHATE 4 MG/ML IJ SOLN
INTRAMUSCULAR | Status: DC | PRN
Start: 2020-04-11 — End: 2020-04-11
  Administered 2020-04-11 (×2): 4 mg via PERINEURAL

## 2020-04-11 MED ORDER — PROMETHAZINE HCL 12.5 MG PO TABS
12.5000 mg | ORAL_TABLET | Freq: Four times a day (QID) | ORAL | 0 refills | Status: DC | PRN
Start: 2020-04-11 — End: 2020-04-26

## 2020-04-11 MED ORDER — FENTANYL CITRATE (PF) 100 MCG/2ML IJ SOLN
INTRAMUSCULAR | Status: AC
  Filled 2020-04-11: qty 2

## 2020-04-11 MED ORDER — LIDOCAINE HCL (PF) 1 % IJ SOLN
INTRAMUSCULAR | Status: DC | PRN
Start: 2020-04-11 — End: 2020-04-11
  Administered 2020-04-11 (×2): 3 mL

## 2020-04-11 MED ORDER — ONDANSETRON HCL 4 MG/2ML IJ SOLN
INTRAMUSCULAR | Status: DC | PRN
  Administered 2020-04-11: 4 mg via INTRAVENOUS

## 2020-04-11 MED ORDER — MIDAZOLAM HCL 5 MG/5ML IJ SOLN
INTRAMUSCULAR | Status: DC | PRN
  Administered 2020-04-11: 2 mg via INTRAVENOUS

## 2020-04-11 MED ORDER — BUPIVACAINE-EPINEPHRINE (PF) 0.5% -1:200000 IJ SOLN
INTRAMUSCULAR | Status: AC
  Filled 2020-04-11: qty 60

## 2020-04-11 MED ORDER — LACTATED RINGERS IV SOLN: INTRAVENOUS | Status: DC | PRN

## 2020-04-11 MED ORDER — BUPIVACAINE HCL (PF) 0.5 % IJ SOLN
INTRAMUSCULAR | Status: DC | PRN
Start: 2020-04-11 — End: 2020-04-11
  Administered 2020-04-11: 24 mL via PERINEURAL

## 2020-04-11 MED ORDER — HYDROCODONE-ACETAMINOPHEN 10-325 MG PO TABS: 1.0000 | ORAL_TABLET | ORAL | 0 refills | Status: DC | PRN

## 2020-04-11 MED ORDER — ONDANSETRON HCL 4 MG/2ML IJ SOLN
INTRAMUSCULAR | Status: AC
  Filled 2020-04-11: qty 2

## 2020-04-11 MED ORDER — BUPIVACAINE HCL (PF) 0.25 % IJ SOLN
INTRAMUSCULAR | Status: DC | PRN
  Administered 2020-04-11: 14 mL

## 2020-04-11 MED ORDER — DEXAMETHASONE SODIUM PHOSPHATE 4 MG/ML IJ SOLN
INTRAMUSCULAR | Status: DC | PRN
  Administered 2020-04-11: 5 mg via INTRAVENOUS

## 2020-04-11 MED ORDER — CEFAZOLIN SODIUM-DEXTROSE 2-4 GM/100ML-% IV SOLN
INTRAVENOUS | Status: AC
  Filled 2020-04-11: qty 100

## 2020-04-11 MED ORDER — DEXAMETHASONE SODIUM PHOSPHATE 4 MG/ML IJ SOLN
INTRAMUSCULAR | Status: AC
  Filled 2020-04-11: qty 2

## 2020-04-11 MED ORDER — HYDROMORPHONE HCL 1 MG/ML IJ SOLN
0.2500 mg | INTRAMUSCULAR | Status: DC | PRN
  Administered 2020-04-11: 0.5 mg via INTRAVENOUS
  Filled 2020-04-11: qty 0.5

## 2020-04-11 MED ORDER — MIDAZOLAM HCL 2 MG/2ML IJ SOLN
2.0000 mg | Freq: Once | INTRAMUSCULAR | Status: AC
  Administered 2020-04-11: 2 mg via INTRAVENOUS

## 2020-04-11 MED ORDER — MEPERIDINE HCL 50 MG/ML IJ SOLN: 6.2500 mg | INTRAMUSCULAR | Status: DC | PRN

## 2020-04-11 MED ORDER — FENTANYL CITRATE (PF) 100 MCG/2ML IJ SOLN
INTRAMUSCULAR | Status: DC | PRN
  Administered 2020-04-11 (×2): 50 ug via INTRAVENOUS

## 2020-04-11 MED ORDER — MIDAZOLAM HCL 2 MG/2ML IJ SOLN
INTRAMUSCULAR | Status: AC
  Filled 2020-04-11: qty 2

## 2020-04-11 MED ORDER — DEXAMETHASONE SODIUM PHOSPHATE 10 MG/ML IJ SOLN
INTRAMUSCULAR | Status: AC
  Filled 2020-04-11: qty 1

## 2020-04-11 MED ORDER — BUPIVACAINE HCL (PF) 0.5 % IJ SOLN: INTRAMUSCULAR | Status: DC | PRN

## 2020-04-11 MED ORDER — BUPIVACAINE HCL (PF) 0.5 % IJ SOLN
INTRAMUSCULAR | Status: AC
  Filled 2020-04-11: qty 30

## 2020-04-11 SURGICAL SUPPLY — 77 items
APL PRP STRL LF DISP 70% ISPRP (MISCELLANEOUS) ×1
BANDAGE ACE 4 STERILE (GAUZE/BANDAGES/DRESSINGS) ×6 IMPLANT
BANDAGE ESMARK 4X12 BL STRL LF (DISPOSABLE) ×1 IMPLANT
BIT DRILL 3.5X122MM AO FIT (BIT) ×2 IMPLANT
BIT DRILL CANN 2.7 (BIT) ×2
BIT DRILL CANN 2.7MM (BIT) ×1
BIT DRILL SRG 2.7XCANN AO CPLG (BIT) IMPLANT
BIT DRL SRG 2.7XCANN AO CPLNG (BIT) ×1
BLADE SURG SZ10 CARB STEEL (BLADE) ×3 IMPLANT
BNDG CMPR 12X4 ELC STRL LF (DISPOSABLE) ×1
BNDG CMPR STD VLCR NS LF 5.8X4 (GAUZE/BANDAGES/DRESSINGS) ×2
BNDG COHESIVE 4X5 TAN STRL (GAUZE/BANDAGES/DRESSINGS) ×3 IMPLANT
BNDG ELASTIC 4X5.8 VLCR NS LF (GAUZE/BANDAGES/DRESSINGS) ×6 IMPLANT
BNDG ESMARK 4X12 BLUE STRL LF (DISPOSABLE) ×3
CHLORAPREP W/TINT 26 (MISCELLANEOUS) ×4 IMPLANT
CLOTH BEACON ORANGE TIMEOUT ST (SAFETY) ×3 IMPLANT
COVER LIGHT HANDLE STERIS (MISCELLANEOUS) ×6 IMPLANT
COVER WAND RF STERILE (DRAPES) ×3 IMPLANT
CUFF TOURN SGL QUICK 34 (TOURNIQUET CUFF) ×3
CUFF TRNQT CYL 34X4.125X (TOURNIQUET CUFF) ×1 IMPLANT
DECANTER SPIKE VIAL GLASS SM (MISCELLANEOUS) IMPLANT
DRAPE C-ARM FOLDED MOBILE STRL (DRAPES) ×3 IMPLANT
DRESSING XEROFORM 5X9 (GAUZE/BANDAGES/DRESSINGS) ×2 IMPLANT
DRILL 2.6X122MM WL AO SHAFT (BIT) ×2 IMPLANT
ELECT REM PT RETURN 9FT ADLT (ELECTROSURGICAL) ×3
ELECTRODE REM PT RTRN 9FT ADLT (ELECTROSURGICAL) ×1 IMPLANT
GAUZE SPONGE 4X4 12PLY STRL (GAUZE/BANDAGES/DRESSINGS) ×3 IMPLANT
GAUZE XEROFORM 5X9 LF (GAUZE/BANDAGES/DRESSINGS) ×3 IMPLANT
GLOVE BIOGEL PI IND STRL 7.0 (GLOVE) ×3 IMPLANT
GLOVE BIOGEL PI INDICATOR 7.0 (GLOVE) ×6
GLOVE SKINSENSE NS SZ8.0 LF (GLOVE) ×2
GLOVE SKINSENSE STRL SZ8.0 LF (GLOVE) ×1 IMPLANT
GLOVE SS N UNI LF 8.5 STRL (GLOVE) ×3 IMPLANT
GOWN STRL REUS W/TWL LRG LVL3 (GOWN DISPOSABLE) ×6 IMPLANT
GOWN STRL REUS W/TWL XL LVL3 (GOWN DISPOSABLE) ×3 IMPLANT
INST SET MINOR BONE (KITS) ×3 IMPLANT
K-WIRE 1.6X150 (WIRE) ×6
K-WIRE FX150X1.25XNS LF SS (Wire) ×2 IMPLANT
K-WIRE FX150X1.6XKRSH (WIRE) ×2
K-WIRE ORTHOPEDIC 1.4X150L (WIRE)
K-WIRE SMOOTH (Wire) ×6 IMPLANT
K-WIRE SMOOTH 2.0X150 (WIRE)
KIT TURNOVER KIT A (KITS) ×3 IMPLANT
KWIRE FX150X1.25XNS LF SS (Wire) IMPLANT
KWIRE FX150X1.6XKRSH (WIRE) IMPLANT
KWIRE ORTHOPEDIC 1.4X150L (WIRE) IMPLANT
KWIRE SMOOTH 2.0X150 (WIRE) IMPLANT
MANIFOLD NEPTUNE II (INSTRUMENTS) ×3 IMPLANT
NDL HYPO 21X1.5 SAFETY (NEEDLE) ×1 IMPLANT
NEEDLE HYPO 21X1.5 SAFETY (NEEDLE) ×3 IMPLANT
NS IRRIG 1000ML POUR BTL (IV SOLUTION) ×3 IMPLANT
PACK BASIC LIMB (CUSTOM PROCEDURE TRAY) ×3 IMPLANT
PAD ABD 5X9 TENDERSORB (GAUZE/BANDAGES/DRESSINGS) ×6 IMPLANT
PAD ARMBOARD 7.5X6 YLW CONV (MISCELLANEOUS) ×3 IMPLANT
PAD CAST 4YDX4 CTTN HI CHSV (CAST SUPPLIES) ×1 IMPLANT
PADDING CAST COTTON 4X4 STRL (CAST SUPPLIES) ×3
PADDING WEBRIL 4 STERILE (GAUZE/BANDAGES/DRESSINGS) ×4 IMPLANT
PLATE 7H 96MM (Plate) ×2 IMPLANT
SCREW 46X4.0MM (Screw) ×2 IMPLANT
SCREW BONE 18 (Screw) ×2 IMPLANT
SCREW BONE 26MMX3.5MM (Screw) ×2 IMPLANT
SCREW BONE 50MMX3.5MM (Screw) ×2 IMPLANT
SCREW BONE NON-LCKING 3.5X12MM (Screw) ×8 IMPLANT
SCREW LOCK 20MMX3.5 (Screw) ×2 IMPLANT
SCREW LOCKING 22MM (Screw) ×2 IMPLANT
SCREW NONLOCK 22MM (Screw) ×2 IMPLANT
SET BASIN LINEN APH (SET/KITS/TRAYS/PACK) ×3 IMPLANT
SPLINT PLASTER CAST XFAST 4X15 (CAST SUPPLIES) IMPLANT
SPLINT PLASTER XTRA FAST SET 4 (CAST SUPPLIES) ×2
SPONGE GAUZE 4X4 12PLY (GAUZE/BANDAGES/DRESSINGS) ×2 IMPLANT
SPONGE LAP 18X18 RF (DISPOSABLE) ×3 IMPLANT
STAPLER VISISTAT 35W (STAPLE) ×3 IMPLANT
SUT ETHILON 3 0 FSL (SUTURE) IMPLANT
SUT MON AB 0 CT1 (SUTURE) ×3 IMPLANT
SUT MON AB 2-0 CT1 36 (SUTURE) ×2 IMPLANT
SYR 30ML LL (SYRINGE) ×3 IMPLANT
SYR BULB IRRIGATION 50ML (SYRINGE) ×3 IMPLANT

## 2020-04-11 NOTE — Progress Notes (Signed)
Right leg nerve block by Dr. Wyatt Haste performed beginning 1201 and completed at 1215. Patient tolerated well.

## 2020-04-11 NOTE — Interval H&P Note (Signed)
History and Physical Interval Note:  04/11/2020 1:11 PM  Tonya Vincent  has presented today for surgery, with the diagnosis of trimallolar right ankle fracture.  The various methods of treatment have been discussed with the patient and family. After consideration of risks, benefits and other options for treatment, the patient has consented to  Procedure(s): OPEN REDUCTION INTERNAL FIXATION (ORIF) ANKLE FRACTURE (Right) as a surgical intervention.  The patient's history has been reviewed, patient examined, no change in status, stable for surgery.  I have reviewed the patient's chart and labs.  Questions were answered to the patient's satisfaction.     Arther Abbott

## 2020-04-11 NOTE — Brief Op Note (Signed)
04/11/2020  3:05 PM  PATIENT:  Tonya Vincent  69 y.o. female  PRE-OPERATIVE DIAGNOSIS:  trimallolar right ankle fracture  POST-OPERATIVE DIAGNOSIS:  trimallolar right ankle fracture  PROCEDURE:  Procedure(s): OPEN REDUCTION INTERNAL FIXATION (ORIF) ANKLE FRACTURE (Right)   Implants: Stryker ankle solutions 7 hole lateral plate with locking and nonlocking screws plus one interfrag screw and medially a 4.0 partially-threaded screw and a 1.4 K wire  SURGEON:  Surgeon(s) and Role:    * Carole Civil, MD - Primary  PHYSICIAN ASSISTANT:   ASSISTANTS: Bonney Roussel  ANESTHESIA:   general and Popliteal nerve block and saphenous nerve block  EBL:  5 mL   BLOOD ADMINISTERED:none  DRAINS: none   LOCAL MEDICATIONS USED:  NONE  SPECIMEN:  No Specimen  DISPOSITION OF SPECIMEN:  N/A  COUNTS:  YES  TOURNIQUET:   Total Tourniquet Time Documented: Thigh (Right) -75 minutes minutes Total: Thigh (Right) -75 minutes   DICTATION: .Viviann Spare Dictation  PLAN OF CARE: Discharge to home after PACU  PATIENT DISPOSITION:  PACU - hemodynamically stable.   Delay start of Pharmacological VTE agent (>24hrs) due to surgical blood loss or risk of bleeding: not applicable  Surgery was done as follows  The patient was seen in preop prepped and surgical site confirmed as right ankle and marked as such  The chart was reviewed the images were reviewed and I reviewed the implants with the surgical staff  She was taken to the operating room after she received popliteal and saphenous nerve blocks by anesthesia.  She had general anesthesia in the operating room followed by sterile prep and drape.  Timeout was taken and completed everyone agreed on the surgical site and procedure  The limb was exsanguinated with a 4 inch Esmarch the tourniquet was elevated to 300 mmHg  The lateral incision was made over the fibula the incision was taken down to bone sharp dissection was used to elevate the  soft tissues away from the oblique fibular fracture.  The syndesmosis was observed to be intact  The fracture was debrided and irrigated and then manipulated into reduced position with traction pronation and internal rotation.  A sharp bone reduction forcep was used to hold the bone in place and the C-arm was brought in to confirm reduction.  I was satisfied with length and mortise restoration and proceeded to place an interfrag screw with a 3.5 drill bit 2.7 drill bit followed by measuring and then placement of a 3.5 nonlocking screw.  X-ray confirmed reduction of the bone in the mortise.  I then placed a one third tubular plate first using nonlocking screws and then trading out 2 of the screws one distal screw for locking screw and then one of the nonlocking screws for a syndesmosis screw.  This was placed to add additional fixation secondary to the patient's diabetes  Final x-ray of the lateral malleolus confirmed that it was reduced and that the mortise was reduced  I then turned my attention to the medial malleolus where I made a straight incision proximally curved gently distally and divided the tissue down to bone and then soft tissue blunt dissection was carried out to expose the distal portion of the medial malleolus as well as the deltoid ligament.  I identified the posterior tibial tendon and protected it.  I used a drill hole in the distal tibia and a sharp reduction clamp to hold the medial malleolus in place after irrigation and debridement of the fracture.  Images confirmed reduction  and a K wire was placed into the medial malleolus to hold it in place.  I was not satisfied with this K wire and I removed it after removing the reduction clamp and placing a K wire at this position.  I then remove the anterior K wire which was not happy with.  I overdrilled the second K wire and placed a 48 mm screw and confirmed its position and reduction with x-ray  I then placed a second 1.4 K wire  and checked reduction and x-ray.  Its position was good I cut it after backing it out then bent it and then hammered it back into bone  Final images confirmed reduction and hardware position.  Ankle mortise was intact all bones were reduced  I thoroughly irrigated both incisions.  I closed the medial incision with 2-0 Monocryl and staples I closed the lateral incision with 0 Monocryl and staples  I did not inject any local medication

## 2020-04-11 NOTE — H&P (Signed)
Patient ID: VENESHA ACCURSO, female DOB: 15-Jun-1951, 69 y.o. MRN: [id number]  Emergency room record from (date) May 4 has been reviewed and this is included by reference and includes the review of systems with the following addition:      Chief Complaint  Patient presents with  . Ankle Injury    04/05/20 right ankle injury    HPI  KRYSTINE BELEN is a 69 y.o. female. Presents for evaluation of a trimalleolar ankle fracture  She is 69 years old she is retired she has diabetes and hypertension she had rectal cancer in the past which a callus is some occasional diarrhea but she presents on this occasion after slipping falling and injuring her left ankle. She had a closed reduction in the emergency room and she was placed in appropriate splint although it is bothering her  Primary complaint is pain and inability to weight-bear   Review of Systems  Review of Systems  Gastrointestinal: Positive for diarrhea.  All other systems reviewed and are negative.   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       Past Surgical History:  Procedure Laterality Date  . ABDOMINAL HYSTERECTOMY     for uterine fibroids  . COLON SURGERY N/A 04/24/2010   "took out half rectum"; rectal cancer  . EYE SURGERY     lasik surgery  . HAND SURGERY     carpal tunnel- left  . [place]  . PORT-A-CATH REMOVAL N/A 02/17/2019   Procedure: REMOVAL PORT-A-CATH; Surgeon: Coralie Keens, MD; Location: [facility]; Service: General; Laterality: N/A;  . PORTACATH PLACEMENT     2011  . RECTAL SURGERY     2010  . WISDOM TOOTH EXTRACTION          Family History  Problem Relation Age of Onset  . Leukemia Father 52  . Colon cancer Neg Hx   . Esophageal  cancer Neg Hx   . Stomach cancer Neg Hx   . Rectal cancer Neg Hx    Social History  Social History        Tobacco Use  . Smoking status: Former Smoker    Packs/day: 0.50    Years: 35.00    Pack years: 17.50    Types: Cigarettes, Cigars    Quit date: 12/03/2009    Years since quitting: 10.3  . Smokeless tobacco: Never Used  Substance Use Topics  . Alcohol use: Yes    Comment: socially  . Drug use: No       Allergies  Allergen Reactions  . Advanced Hand Sanitizer [Alcohol] Dermatitis  . Norgesic [Orphenadrine-Aspirin-Caffeine] Hives  . Antibacterial Hand Soap [Triclosan] Dermatitis         Current Outpatient Medications  Medication Sig Dispense Refill  . amLODipine (NORVASC) 10 MG tablet Take 10 mg by mouth at bedtime.     Marland Kitchen aspirin EC 81 MG tablet Take 81 mg by mouth at bedtime.     . docusate sodium (COLACE) 100 MG capsule Take 100-200 mg by mouth daily as needed for mild constipation.    . empagliflozin (JARDIANCE) 25 MG TABS tablet Take 25 mg by mouth daily with supper.     Marland Kitchen HYDROcodone-acetaminophen (NORCO/VICODIN) 5-325 MG tablet Take 1-2 tablets by mouth every 6 (six) hours as needed. 6 tablet 0  .  latanoprost (XALATAN) 0.005 % ophthalmic solution Place 1 drop into both eyes at bedtime.     Marland Kitchen losartan (COZAAR) 50 MG tablet Take 50 mg by mouth daily.    . Menthol, Topical Analgesic, 16 % LIQD Apply 1 application topically 2 (two) times daily as needed (pain).    . methylphenidate 36 MG PO CR tablet Take 36 mg by mouth daily with breakfast.    . Propylene Glycol 0.6 % SOLN Place 1 drop into both eyes 3 (three) times daily.     . Semaglutide (OZEMPIC, 1 MG/DOSE, Heath) Inject into the skin.    Marland Kitchen vortioxetine HBr (TRINTELLIX) 10 MG TABS tablet Take 10 mg by mouth daily with supper.     . meloxicam (MOBIC) 7.5 MG tablet Take 7.5 mg by mouth 2 (two) times daily as needed for pain.     . mirabegron ER (MYRBETRIQ) 25 MG TB24 tablet Take 25 mg by mouth daily.    . traMADol  (ULTRAM) 50 MG tablet Take 1 tablet (50 mg total) by mouth every 6 (six) hours as needed for moderate pain or severe pain. (Patient not taking: Reported on 04/07/2020) 25 tablet 0   No current facility-administered medications for this visit.   Physical Exam  BP (!) 156/100  Pulse 90  Ht 5\' 3"  (1.6 m)  Wt 190 lb (86.2 kg)  BMI 33.66 kg/m  Body mass index is 33.66 kg/m.  Well developed and well nourished  Unable to ambulate alert and oriented x 3  Normal affect and mood  Ortho Exam  Upper extremities no contracture subluxation atrophy or tremor  Left lower extremity normal  Right ankle tenderness globally swelling globally neurovascular exam intact  Data Reviewed  IMAGING From THE ER AND THE REPORT ARE REVIEWED, MY INTERPRETATION OF THE IMAGE(S) IS :  Initial hospital films show what appears to be trimalleolar ankle fracture with horizontal medial malleolar fracture oblique fibular fracture and a posterior lip fracture  Post reduction film shows same  Assessment  Trimalleolar ankle fracture  Plan   CT scan shows the posterior malleolar fracture is small and more normal avulsion.  This indicates the fracture will require only medial lateral fixation in the supine position  Plan is for open reduction internal fixation right ankle for trimalleolar fracture.  The procedure has been fully reviewed with the patient; The risks and benefits of surgery have been discussed and explained and understood. Alternative treatment has also been reviewed, questions were encouraged and answered. The postoperative plan is also been reviewed.  Specific to this particular injury is the patient's diabetes which brings into affect more risks of complication of slower healing loss of fixation infection and even amputation.

## 2020-04-11 NOTE — Op Note (Signed)
04/11/2020  3:05 PM  PATIENT:  Tonya Vincent  69 y.o. female  PRE-OPERATIVE DIAGNOSIS:  trimallolar right ankle fracture  POST-OPERATIVE DIAGNOSIS:  trimallolar right ankle fracture  PROCEDURE:  Procedure(s): OPEN REDUCTION INTERNAL FIXATION (ORIF) ANKLE FRACTURE (Right)   Implants: Stryker ankle solutions 7 hole lateral plate with locking and nonlocking screws plus one interfrag screw and medially a 4.0 partially-threaded screw and a 1.4 K wire  SURGEON:  Surgeon(s) and Role:    * Carole Civil, MD - Primary  PHYSICIAN ASSISTANT:   ASSISTANTS: Bonney Roussel  ANESTHESIA:   general and Popliteal nerve block and saphenous nerve block  EBL:  5 mL   BLOOD ADMINISTERED:none  DRAINS: none   LOCAL MEDICATIONS USED:  NONE  SPECIMEN:  No Specimen  DISPOSITION OF SPECIMEN:  N/A  COUNTS:  YES  TOURNIQUET:   Total Tourniquet Time Documented: Thigh (Right) -75 minutes minutes Total: Thigh (Right) -75 minutes   DICTATION: .Viviann Spare Dictation  PLAN OF CARE: Discharge to home after PACU  PATIENT DISPOSITION:  PACU - hemodynamically stable.   Delay start of Pharmacological VTE agent (>24hrs) due to surgical blood loss or risk of bleeding: not applicable  Surgery was done as follows  The patient was seen in preop prepped and surgical site confirmed as right ankle and marked as such  The chart was reviewed the images were reviewed and I reviewed the implants with the surgical staff  She was taken to the operating room after she received popliteal and saphenous nerve blocks by anesthesia.  She had general anesthesia in the operating room followed by sterile prep and drape.  Timeout was taken and completed everyone agreed on the surgical site and procedure  The limb was exsanguinated with a 4 inch Esmarch the tourniquet was elevated to 300 mmHg  The lateral incision was made over the fibula the incision was taken down to bone sharp dissection was used to elevate the  soft tissues away from the oblique fibular fracture.  The syndesmosis was observed to be intact  The fracture was debrided and irrigated and then manipulated into reduced position with traction pronation and internal rotation.  A sharp bone reduction forcep was used to hold the bone in place and the C-arm was brought in to confirm reduction.  I was satisfied with length and mortise restoration and proceeded to place an interfrag screw with a 3.5 drill bit 2.7 drill bit followed by measuring and then placement of a 3.5 nonlocking screw.  X-ray confirmed reduction of the bone in the mortise.  I then placed a one third tubular plate first using nonlocking screws and then trading out 2 of the screws one distal screw for locking screw and then one of the nonlocking screws for a syndesmosis screw.  This was placed to add additional fixation secondary to the patient's diabetes  Final x-ray of the lateral malleolus confirmed that it was reduced and that the mortise was reduced  I then turned my attention to the medial malleolus where I made a straight incision proximally curved gently distally and divided the tissue down to bone and then soft tissue blunt dissection was carried out to expose the distal portion of the medial malleolus as well as the deltoid ligament.  I identified the posterior tibial tendon and protected it.  I used a drill hole in the distal tibia and a sharp reduction clamp to hold the medial malleolus in place after irrigation and debridement of the fracture.  Images confirmed reduction  and a K wire was placed into the medial malleolus to hold it in place.  I was not satisfied with this K wire and I removed it after removing the reduction clamp and placing a K wire at this position.  I then remove the anterior K wire which was not happy with.  I overdrilled the second K wire and placed a 48 mm screw and confirmed its position and reduction with x-ray  I then placed a second 1.4 K wire  and checked reduction and x-ray.  Its position was good I cut it after backing it out then bent it and then hammered it back into bone  Final images confirmed reduction and hardware position.  Ankle mortise was intact all bones were reduced  I thoroughly irrigated both incisions.  I closed the medial incision with 2-0 Monocryl and staples I closed the lateral incision with 0 Monocryl and staples  I did not inject any local medication  [id number]

## 2020-04-11 NOTE — Transfer of Care (Signed)
Immediate Anesthesia Transfer of Care Note  Patient: Tonya Vincent  Procedure(s) Performed: OPEN REDUCTION INTERNAL FIXATION (ORIF) ANKLE FRACTURE (Right Ankle)  Patient Location: PACU  Anesthesia Type:General  Level of Consciousness: awake, alert  and oriented  Airway & Oxygen Therapy: Patient Spontanous Breathing and Patient connected to nasal cannula oxygen  Post-op Assessment: Report given to RN and Post -op Vital signs reviewed and stable  Post vital signs: Reviewed and stable  Last Vitals:  Vitals Value Taken Time  BP    Temp    Pulse    Resp    SpO2      Last Pain:  Vitals:   04/11/20 1111  TempSrc: Oral  PainSc: 4       Patients Stated Pain Goal: 6 ([id number])  Complications: No apparent anesthesia complications

## 2020-04-11 NOTE — Anesthesia Procedure Notes (Signed)
Procedure Name: LMA Insertion Date/Time: 04/11/2020 1:21 PM Performed by: Jonna Munro, CRNA Pre-anesthesia Checklist: Patient identified, Emergency Drugs available, Suction available, Timeout performed and Patient being monitored Patient Re-evaluated:Patient Re-evaluated prior to induction Oxygen Delivery Method: Circle system utilized Preoxygenation: Pre-oxygenation with 100% oxygen Induction Type: IV induction LMA: LMA inserted LMA Size: 3.0 Number of attempts: 1 Placement Confirmation: positive ETCO2 and breath sounds checked- equal and bilateral Dental Injury: Teeth and Oropharynx as per pre-operative assessment

## 2020-04-11 NOTE — Anesthesia Postprocedure Evaluation (Signed)
Anesthesia Post Note  Patient: Tonya Vincent  Procedure(s) Performed: OPEN REDUCTION INTERNAL FIXATION (ORIF) ANKLE FRACTURE (Right Ankle)  Patient location during evaluation: PACU Anesthesia Type: General Level of consciousness: awake and alert, patient cooperative, oriented and awake Pain management: pain level controlled Vital Signs Assessment: post-procedure vital signs reviewed and stable Respiratory status: spontaneous breathing, respiratory function stable, nonlabored ventilation and patient connected to nasal cannula oxygen Cardiovascular status: stable Postop Assessment: no apparent nausea or vomiting Anesthetic complications: no     Last Vitals:  Vitals:   04/11/20 1111 04/11/20 1230  BP: (!) 156/79 (!) 143/83  Pulse: 75 67  Resp: (!) 25 16  Temp: 37.1 C   SpO2: 97% 95%    Last Pain:  Vitals:   04/11/20 1111  TempSrc: Oral  PainSc: 4                  Montel Vanderhoof

## 2020-04-11 NOTE — Anesthesia Procedure Notes (Addendum)
Anesthesia Regional Block: Adductor canal block   Pre-Anesthetic Checklist: ,, timeout performed, Correct Patient, Correct Site, Correct Laterality, Correct Procedure, Correct Position, site marked, Risks and benefits discussed,  Surgical consent,  Pre-op evaluation,  At surgeon's request and post-op pain management  Laterality: Right  Prep: chloraprep       Needles:  Injection technique: Single-shot  Needle Type: Echogenic Stimulator Needle     Needle Length: 8.3cm  Needle Gauge: 22   Needle insertion depth: 6 cm   Additional Needles:   Procedures:,,,, ultrasound used (permanent image in chart),,,,   Nerve Stimulator or Paresthesia:  Response: Twitch elicited, 0.8 mA,   Additional Responses:   Narrative:  Start time: 04/11/2020 3:34 PM End time: 04/11/2020 3:39 PM Injection made incrementally with aspirations every 5 mL.  Performed by: Personally  Anesthesiologist: Denese Killings, MD  Additional Notes:  Patient in PACU c/o pain in medial side of the ankle. After nerve location bupivacaine (0.25%) 15 ml was  injected incrementally, slowly , and after neg aspirations. Tolerated well. Patient pain improved after the block.

## 2020-04-11 NOTE — Discharge Instructions (Signed)
General Anesthesia, Adult, Care After This sheet gives you information about how to care for yourself after your procedure. Your health care provider may also give you more specific instructions. If you have problems or questions, contact your health care provider. What can I expect after the procedure? After the procedure, the following side effects are common:  Pain or discomfort at the IV site.  Nausea.  Vomiting.  Sore throat.  Trouble concentrating.  Feeling cold or chills.  Weak or tired.  Sleepiness and fatigue.  Soreness and body aches. These side effects can affect parts of the body that were not involved in surgery. Follow these instructions at home:  For at least 24 hours after the procedure:  Have a responsible adult stay with you. It is important to have someone help care for you until you are awake and alert.  Rest as needed.  Do not: ? Participate in activities in which you could fall or become injured. ? Drive. ? Use heavy machinery. ? Drink alcohol. ? Take sleeping pills or medicines that cause drowsiness. ? Make important decisions or sign legal documents. ? Take care of children on your own. Eating and drinking  Follow any instructions from your health care provider about eating or drinking restrictions.  When you feel hungry, start by eating small amounts of foods that are soft and easy to digest (bland), such as toast. Gradually return to your regular diet.  Drink enough fluid to keep your urine pale yellow.  If you vomit, rehydrate by drinking water, juice, or clear broth. General instructions  If you have sleep apnea, surgery and certain medicines can increase your risk for breathing problems. Follow instructions from your health care provider about wearing your sleep device: ? Anytime you are sleeping, including during daytime naps. ? While taking prescription pain medicines, sleeping medicines, or medicines that make you drowsy.  Return to  your normal activities as told by your health care provider. Ask your health care provider what activities are safe for you.  Take over-the-counter and prescription medicines only as told by your health care provider.  If you smoke, do not smoke without supervision.  Keep all follow-up visits as told by your health care provider. This is important. Contact a health care provider if:  You have nausea or vomiting that does not get better with medicine.  You cannot eat or drink without vomiting.  You have pain that does not get better with medicine.  You are unable to pass urine.  You develop a skin rash.  You have a fever.  You have redness around your IV site that gets worse. Get help right away if:  You have difficulty breathing.  You have chest pain.  You have blood in your urine or stool, or you vomit blood. Summary  After the procedure, it is common to have a sore throat or nausea. It is also common to feel tired.  Have a responsible adult stay with you for the first 24 hours after general anesthesia. It is important to have someone help care for you until you are awake and alert.  When you feel hungry, start by eating small amounts of foods that are soft and easy to digest (bland), such as toast. Gradually return to your regular diet.  Drink enough fluid to keep your urine pale yellow.  Return to your normal activities as told by your health care provider. Ask your health care provider what activities are safe for you. This information is not   intended to replace advice given to you by your health care provider. Make sure you discuss any questions you have with your health care provider. Document Revised: 11/22/2017 Document Reviewed: 07/05/2017 Elsevier Patient Education  [place].       How to Use a [place] This sheet gives you information about how to use a walker. Your health care provider may also:  Give you more specific instructions based on your  condition.  Give you instructions on how to use your legs or arms to support your body weight (weight bearing). What are the risks? Using a walker is generally safe. However, it may cause falls if it is not used correctly. How to walk with a walker The best way to walk with a walker depends on whether you are using a standard walker or a front-wheeled walker. A standard walker has rubber tips on the ends of all four legs. A front-wheeled walker has wheels on the ends of the front legs and rubber tips on the ends of the back legs.  Do not use your walker on stairs or an escalator unless you have been trained by a physical therapist and your health care provider approves. Standard walker  1. Pick up your walker. Do not slide your standard walker. 2. Set down your walker, one step-length in front of you. Make sure that all four legs of the walker touch the ground at the same time. Your toes should be farther forward than the back legs of your walker. 3. Hold on to the walker, and press it with your arms for support. 4. Step your weaker leg into the middle of the walker. Step your stronger leg forward to land next to your weaker leg. 5. Repeat this process for each step. Front-wheeled walker  1. Slide your front-wheeled walker one step-length in front of you. Your toes should be farther forward than the back legs of your walker. 2. Hold on to the walker for support and press through the handrails as needed for support. 3. Step your weaker leg into the middle of the walker. Step your stronger leg forward to land next to your weaker leg. 4. Repeat the process for each step. Tips  Always keep both feet within the width of the walker's legs or wheels.  When using your walker, you should not feel like you need to lean forward or to the side to keep your hands on the handgrips.  Make sure you are following any weight-bearing instructions that your health care provider gave you.  If you have a  standard walker: ? Do not slide your walker when you are moving.  If you have a front-wheeled walker: ? Be careful not to let the walker get too far ahead of you as you walk. ? If your walker does not glide well over carpet, consider cutting an "X" into two tennis balls and placing the balls over the back legs of your walker. How to stand up with a walker 1. Put your walker in front of you. 2. Slide forward in your chair. 3. Position your legs so that your weaker leg is ahead of you and your stronger leg is bent and near your chair. 4. Position your hands. ? If your chair has armrests, put each hand on an armrest. ? If there are no armrests, put the hand opposite your weaker leg on the chair seat, and put the other hand on the center of the walker's crossbar. 5. Lean forward and push up  from your chair. 6. Rise by straightening your stronger leg. 7. Steady yourself. 8. Carefully move your hands to the handgrips of the walker. Tips  Do not pull on the walker when you stand up. This may cause it to tip.  Sit in a firm chair whenever you can. A low seat or an overstuffed chair or sofa is hard to get out of. How to sit down with a walker Seat with armrests 1. Back up toward your seat, using your walker, until you feel the back of your legs touch the chair. 2. Carefully reach your hands behind you and put each hand on an armrest. 3. Slowly lower yourself into the seat with your injured leg out in front. Seat without armrests 1. Back up toward the side of the seat, using your walker, until you feel the back of your legs touch the chair. 2. Use one hand to hold on to the back of the chair, and use the other hand to hold on to the front of the seat. 3. Slowly lower yourself into the seat. How to use a walker on a curb or step Stepping up  1. Put all four legs of the walker on the curb or step. 2. Get your feet as close to the curb or step as you can. 3. Test the steadiness of the walker  by pressing down on the handgrips. 4. If the walker is steady, press down on it with your hands as you step up with your stronger leg. 5. Step up with your weaker leg. Stepping down  1. Put all four legs of the walker on the surface that is lower than the curb or step. 2. Get your feet as close to the curb or step as you can. 3. Test the steadiness of the walker by pressing down on the handgrips. 4. If the walker is steady, press down on it with your hands as you step down with your weaker leg. 5. Step down with your stronger leg. Summary  Follow specific instructions from your health care provider on how to use a walker.  Do not use your walker on stairs or an escalator unless you have been trained by a physical therapist and your health care provider approves.  Make sure you are following any weight-bearing instructions that your health care provider gave you. This information is not intended to replace advice given to you by your health care provider. Make sure you discuss any questions you have with your health care provider. Document Revised: 10/15/2018 Document Reviewed: 10/15/2018 Elsevier Patient Education  2020 [place] Use, Adult Crutches are used to take weight off of one of your legs or feet when you stand or walk. You may need crutches to help you heal after an injury or procedure. It is important to use crutches that fit right. Your crutches fit right if:  You can fit two or three fingers between your armpit and the crutch.  You use your hands, not your armpits, to hold yourself up. It is important that a doctor has seen you use crutches the right way before you use them at home. What are the risks? Using crutches the wrong way can hurt your shoulders, arms, back, armpits, wrists, and hands. To avoid this:  Make sure your crutches fit right.  Do not put pressure on your armpits when using the crutches. While using crutches, you also have a higher risk  of falling. To avoid this:  Move objects  away from the floor or area where you walk when possible. Get help as needed.  Keep walkways well lit.  Use a backpack to carry items. How to use your crutches How you use your crutches will depend on why you need them. Your doctor may tell you not to support your body weight (bear weight) on your hurt leg. Or your doctor may let you put some, but not all, of your weight on the hurt leg. Follow instructions from your doctor about putting weight on your leg. Do not put weight on your leg in an amount that causes pain. Walking  1. Stand on your good leg and lift both crutches at the same time. 2. Place the crutches one step-length in front of you. Keep your weight over the hand grips. 3. Bring the good leg forward to meet the crutches or to land a little bit ahead of them. 4. Repeat. Going up steps  If there is no handrail: 1. Walk up to steps and put weight on hand grips to step up. 2. Step up with your good leg. 3. Step up with the crutches and your hurt leg. 4. Repeat. If there is a handrail: 1. Hold both crutches in one hand. 2. Place your other hand on the handrail. 3. Put your weight on your arms and lift your good leg up to the step. 4. Bring the crutches and the hurt leg up to that step. 5. Repeat. If you do not feel steady on steps, you can go up steps on your butt. 1. Sit on the lowest step. ? Have your hurt leg out in front. ? Hold both crutches flat on the stairs in one hand. 2. Scoot your butt up to the next step. Use your free hand and your good leg to help you do this.  Going down steps If there is no handrail: 1. Step down with your hurt leg and crutches. Keep the crutch tips in the center of the step. Do not put crutch tips close to the edge of the step. 2. Step down with your good leg. 3. Repeat. If there is a handrail: 1. Place one hand on the handrail. 2. Hold both crutches with your free hand. 3. Lower your hurt leg  and crutches to the step below you. Keep the crutch tips in the center of the step. Do not put the crutch tips close to the edge of the step. 4. Lower your good leg to the next step. 5. Repeat. If you do not feel steady on steps, you can go down steps on your butt. 1. Sit on the highest step. ? Have your hurt leg out in front. ? Use your other hand to hold both crutches flat on the stairs. 2. Scoot your butt down to the next step. Use the free hand and your good leg to help you do this.  Standing up  Move to the edge of the seat. If there is an armrest: 1. Hold the hurt leg forward. 2. Grab the armrest with one hand. Use the other hand to grab the top of the crutches. 3. Use the armrest and your crutches to pull yourself up to stand. If there is no armrest: 1. Hold the hurt leg forward. 2. Hold on to the seat with one hand. Use the other hand to hold the top of the crutches. 3. Use the seat and your crutches to bring yourself up to stand. Sitting down  Move back until your leg touches the  edge of the seat. If there is an armrest: 1. Hold the hurt leg forward. 2. Grab the armrest with one hand. Use the other hand to grab the top of the crutches. 3. Slowly lower yourself to sit. If there is no armrest: 1. Hold the hurt leg forward. 2. Reach for and hold on to the seat with one hand. Use the other hand to hold on to the top of the crutches. 3. Slowly lower yourself to sit. Get help if:  You feel unsteady using crutches.  You have any new pain.  You lose feeling (feel numb) or you have a tingling feeling.  Your crutches do not fit. Get help right away if:  You fall. Summary  Crutches are used to take weight off of a leg or foot when you stand or walk.  Make sure your crutches fit right, and do not put pressure on your armpits when using the crutches.  Follow instructions from your doctor about putting weight on your hurt leg. This information is not intended to replace  advice given to you by your health care provider. Make sure you discuss any questions you have with your health care provider. Document Revised: 06/10/2019 Document Reviewed: 06/10/2019 Elsevier Patient Education  [place].

## 2020-04-11 NOTE — Anesthesia Procedure Notes (Addendum)
Anesthesia Regional Block: Adductor canal block   Pre-Anesthetic Checklist: ,, timeout performed, Correct Patient, Correct Site, Correct Laterality, Correct Procedure, Correct Position, site marked, Risks and benefits discussed, pre-op evaluation,  At surgeon's request and post-op pain management  Laterality: Right  Prep: chloraprep       Needles:  Injection technique: Single-shot  Needle Type: Echogenic Stimulator Needle     Needle Length: 10cm  Needle Gauge: 20   Needle insertion depth: 6 cm   Additional Needles:   Procedures:,,,, ultrasound used (permanent image in chart),,,,  Narrative:  Start time: 04/11/2020 12:10 PM End time: 04/11/2020 12:15 PM Injection made incrementally with aspirations every 5 mL.  Performed by: Personally  Anesthesiologist: Denese Killings, MD  Additional Notes: BP cuff, EKG monitors applied. Sedation begun.  After nerve location anesthetic injected incrementally, slowly , and after neg aspirations. Tolerated well.

## 2020-04-11 NOTE — Anesthesia Procedure Notes (Signed)
Anesthesia Regional Block: Popliteal block   Pre-Anesthetic Checklist: ,, timeout performed, Correct Patient, Correct Site, Correct Laterality, Correct Procedure, Correct Position, site marked, Risks and benefits discussed, pre-op evaluation,  At surgeon's request and post-op pain management  Laterality: Right  Prep: Betadine, chloraprep       Needles:  Injection technique: Single-shot  Needle Type: Echogenic Stimulator Needle     Needle Length: 10cm  Needle Gauge: 20   Needle insertion depth: 6 cm   Additional Needles:   Procedures:,,,, ultrasound used (permanent image in chart),,,,  Narrative:  Start time: 04/11/2020 12:01 PM End time: 04/11/2020 12:09 PM Injection made incrementally with aspirations every 25 mL.  Performed by: Personally  Anesthesiologist: Denese Killings, MD  Additional Notes: BP cuff, EKG monitors applied. Sedation begun. After nerve location anesthetic injected incrementally, slowly , and after neg aspirations. Tolerated well.

## 2020-04-11 NOTE — Anesthesia Preprocedure Evaluation (Signed)
Anesthesia Evaluation  Patient identified by MRN, date of birth, ID band Patient awake    Reviewed: Allergy & Precautions, NPO status , Patient's Chart, lab work & pertinent test results  History of Anesthesia Complications (+) history of anesthetic complications  Airway Mallampati: II  TM Distance: >3 FB Neck ROM: Full    Dental no notable dental hx. (+) Dental Advisory Given, Poor Dentition, Missing   Pulmonary former smoker,    Pulmonary exam normal breath sounds clear to auscultation       Cardiovascular hypertension, Pt. on medications Normal cardiovascular exam+ Valvular Problems/Murmurs  Rhythm:Regular Rate:Normal  10-Feb-2019 16:07:33 [facility] ROUTINE RECORD Normal sinus rhythm Inferior infarct , age undetermined Abnormal ECG Confirmed by Ida Rogue [phone]) on 02/11/2019 7:11:28 PM   Neuro/Psych PSYCHIATRIC DISORDERS Anxiety Depression    GI/Hepatic Neg liver ROS, Rectal cancer   Endo/Other  diabetes, Well Controlled, Type 2, Oral Hypoglycemic Agents  Renal/GU      Musculoskeletal Right ankle fracture   Abdominal (+) + obese,   Peds  Hematology  (+) anemia ,   Anesthesia Other Findings   Reproductive/Obstetrics negative OB ROS                            Anesthesia Physical  Anesthesia Plan  ASA: III  Anesthesia Plan: General   Post-op Pain Management:  Regional for Post-op pain   Induction: Intravenous  PONV Risk Score and Plan: 3 and Ondansetron, Dexamethasone and Midazolam  Airway Management Planned: LMA  Additional Equipment:   Intra-op Plan:   Post-operative Plan: Extubation in OR  Informed Consent: I have reviewed the patients History and Physical, chart, labs and discussed the procedure including the risks, benefits and alternatives for the proposed anesthesia with the patient or authorized representative who has indicated his/her  understanding and acceptance.     Dental advisory given  Plan Discussed with: CRNA  Anesthesia Plan Comments: (Right popliteal and saphenous blocks for postop pain)       Anesthesia Quick Evaluation

## 2020-04-13 ENCOUNTER — Telehealth: Payer: Self-pay | Admitting: Orthopedic Surgery

## 2020-04-13 NOTE — Telephone Encounter (Signed)
I spoke with Dr Aline Brochure and he says to have patient take 1 benadryl prior to taking the hydrocodone.  Will you please call her and advise?  And make sure no other problems/reactions?  Thanks.

## 2020-04-13 NOTE — Telephone Encounter (Signed)
Called patient and she verbalized understanding. Advised to call us back and let us know if that doesn't work.

## 2020-04-13 NOTE — Telephone Encounter (Signed)
Patient has just called to relay that she is "itching like crazy" from the pain medication prescribed at time of discharge from out-patient surgery 04/11/20.    HYDROcodone-acetaminophen (NORCO) 10-325 MG tablet 42 tab  -Please advise. Patient uses [facility] cell# [phone]

## 2020-04-21 DIAGNOSIS — Z8781 Personal history of (healed) traumatic fracture: Secondary | ICD-10-CM | POA: Insufficient documentation

## 2020-04-26 ENCOUNTER — Ambulatory Visit: Payer: PPO

## 2020-04-26 ENCOUNTER — Ambulatory Visit (INDEPENDENT_AMBULATORY_CARE_PROVIDER_SITE_OTHER): Payer: PPO | Admitting: Orthopedic Surgery

## 2020-04-26 ENCOUNTER — Other Ambulatory Visit: Payer: Self-pay

## 2020-04-26 ENCOUNTER — Encounter: Payer: Self-pay | Admitting: Orthopedic Surgery

## 2020-04-26 DIAGNOSIS — S82851D Displaced trimalleolar fracture of right lower leg, subsequent encounter for closed fracture with routine healing: Secondary | ICD-10-CM

## 2020-04-26 DIAGNOSIS — Z9889 Other specified postprocedural states: Secondary | ICD-10-CM

## 2020-04-26 DIAGNOSIS — Z8781 Personal history of (healed) traumatic fracture: Secondary | ICD-10-CM

## 2020-04-26 MED ORDER — HYDROCODONE-ACETAMINOPHEN 10-325 MG PO TABS: 1.0000 | ORAL_TABLET | ORAL | 0 refills | Status: DC | PRN

## 2020-04-26 NOTE — Patient Instructions (Signed)
Use the walker

## 2020-04-26 NOTE — Progress Notes (Signed)
Meds ordered this encounter  Medications  . HYDROcodone-acetaminophen (NORCO) 10-325 MG tablet    Sig: Take 1 tablet by mouth every 4 (four) hours as needed.    Dispense:  42 tablet    Refill:  0   Chief Complaint  Patient presents with  . Routine Post Op    ORIF right ankle 04/11/20    xrays fracture looks stable on x-ray no hardware complications are noted  Skin surgical incisions are clean dry and intact with no signs of infection or wound drainage  Boot: nwb  X-ray in 4 weeks  Patient fitted for a Cam walker  3 times a day active range of motion ankle joint

## 2020-05-05 ENCOUNTER — Telehealth: Payer: Self-pay | Admitting: Orthopedic Surgery

## 2020-05-05 NOTE — Telephone Encounter (Signed)
Patient is asking about (1) whether she can get her 'stitches' wet                                     (2) also whether there is something she can apply to clean the area - said she had gotten a band-aid applied only last time                                      (3) and also, is there something topical she can apply  [phone]

## 2020-05-05 NOTE — Telephone Encounter (Signed)
I told her when staples removed she could shower I called again today to advise She voiced understanding  No ointment or lotions

## 2020-05-16 DIAGNOSIS — I10 Essential (primary) hypertension: Secondary | ICD-10-CM | POA: Diagnosis not present

## 2020-05-16 DIAGNOSIS — S8291XA Unspecified fracture of right lower leg, initial encounter for closed fracture: Secondary | ICD-10-CM | POA: Diagnosis not present

## 2020-05-23 ENCOUNTER — Other Ambulatory Visit: Payer: Self-pay

## 2020-05-23 ENCOUNTER — Ambulatory Visit (INDEPENDENT_AMBULATORY_CARE_PROVIDER_SITE_OTHER): Payer: PPO | Admitting: Orthopedic Surgery

## 2020-05-23 ENCOUNTER — Ambulatory Visit: Payer: PPO

## 2020-05-23 ENCOUNTER — Encounter: Payer: Self-pay | Admitting: Orthopedic Surgery

## 2020-05-23 DIAGNOSIS — S82851D Displaced trimalleolar fracture of right lower leg, subsequent encounter for closed fracture with routine healing: Secondary | ICD-10-CM

## 2020-05-23 NOTE — Progress Notes (Signed)
Chief Complaint  Patient presents with  . Post-op Follow-up    right ankle ORIF  04/11/20     Status post open reduction internal fixation right ankle 6 weeks ago doing well having some swelling  X-rays show fractures in good position hardware looks good  Her wounds are healed she has some mild edema in the foot nothing unexpected  Recommend x-ray in 6 weeks continue CAM Walker  Patient can drive in 3 weeks Encounter Diagnosis  Name Primary?  . Closed trimalleolar fracture of right ankle with routine healing, subsequent encounter Yes

## 2020-05-24 ENCOUNTER — Ambulatory Visit: Payer: PPO | Admitting: Orthopedic Surgery

## 2020-05-27 ENCOUNTER — Other Ambulatory Visit: Payer: Self-pay

## 2020-05-27 ENCOUNTER — Emergency Department (HOSPITAL_COMMUNITY): Payer: PPO

## 2020-05-27 ENCOUNTER — Encounter (HOSPITAL_COMMUNITY): Payer: Self-pay

## 2020-05-27 ENCOUNTER — Emergency Department (HOSPITAL_COMMUNITY)
Admission: EM | Admit: 2020-05-27 | Discharge: 2020-05-27 | Disposition: A | Discharge status: Home / Self Care | Payer: PPO | Attending: Emergency Medicine | Admitting: Emergency Medicine

## 2020-05-27 DIAGNOSIS — C20 Malignant neoplasm of rectum: Secondary | ICD-10-CM | POA: Diagnosis not present

## 2020-05-27 DIAGNOSIS — Z886 Allergy status to analgesic agent status: Secondary | ICD-10-CM | POA: Insufficient documentation

## 2020-05-27 DIAGNOSIS — K6389 Other specified diseases of intestine: Secondary | ICD-10-CM | POA: Diagnosis not present

## 2020-05-27 DIAGNOSIS — I700 Atherosclerosis of aorta: Secondary | ICD-10-CM | POA: Diagnosis not present

## 2020-05-27 DIAGNOSIS — I10 Essential (primary) hypertension: Secondary | ICD-10-CM | POA: Diagnosis not present

## 2020-05-27 DIAGNOSIS — K529 Noninfective gastroenteritis and colitis, unspecified: Secondary | ICD-10-CM | POA: Insufficient documentation

## 2020-05-27 DIAGNOSIS — E119 Type 2 diabetes mellitus without complications: Secondary | ICD-10-CM | POA: Diagnosis not present

## 2020-05-27 DIAGNOSIS — Z87891 Personal history of nicotine dependence: Secondary | ICD-10-CM | POA: Insufficient documentation

## 2020-05-27 DIAGNOSIS — D649 Anemia, unspecified: Secondary | ICD-10-CM | POA: Diagnosis not present

## 2020-05-27 DIAGNOSIS — Z79899 Other long term (current) drug therapy: Secondary | ICD-10-CM | POA: Insufficient documentation

## 2020-05-27 DIAGNOSIS — K3189 Other diseases of stomach and duodenum: Secondary | ICD-10-CM | POA: Diagnosis not present

## 2020-05-27 DIAGNOSIS — K625 Hemorrhage of anus and rectum: Secondary | ICD-10-CM | POA: Diagnosis not present

## 2020-05-27 LAB — CBC WITH DIFFERENTIAL/PLATELET
Abs Immature Granulocytes: 0.01 10*3/uL (ref 0.00–0.07)
Basophils Absolute: 0 10*3/uL (ref 0.0–0.1)
Basophils Relative: 1 %
Eosinophils Absolute: 0.1 10*3/uL (ref 0.0–0.5)
Eosinophils Relative: 1 %
HCT: 43.9 % (ref 36.0–46.0)
Hemoglobin: 13.5 g/dL (ref 12.0–15.0)
Immature Granulocytes: 0 %
Lymphocytes Relative: 25 %
Lymphs Abs: 1.5 10*3/uL (ref 0.7–4.0)
MCH: 25.9 pg — ABNORMAL LOW (ref 26.0–34.0)
MCHC: 30.8 g/dL (ref 30.0–36.0)
MCV: 84.3 fL (ref 80.0–100.0)
Monocytes Absolute: 0.8 10*3/uL (ref 0.1–1.0)
Monocytes Relative: 13 %
Neutro Abs: 3.6 10*3/uL (ref 1.7–7.7)
Neutrophils Relative %: 60 %
Platelets: 303 10*3/uL (ref 150–400)
RBC: 5.21 MIL/uL — ABNORMAL HIGH (ref 3.87–5.11)
RDW: 16.6 % — ABNORMAL HIGH (ref 11.5–15.5)
WBC: 6 10*3/uL (ref 4.0–10.5)
nRBC: 0 % (ref 0.0–0.2)

## 2020-05-27 LAB — POC OCCULT BLOOD, ED: Fecal Occult Bld: POSITIVE — AB

## 2020-05-27 LAB — COMPREHENSIVE METABOLIC PANEL
ALT: 15 U/L (ref 0–44)
AST: 16 U/L (ref 15–41)
Albumin: 4.3 g/dL (ref 3.5–5.0)
Alkaline Phosphatase: 112 U/L (ref 38–126)
Anion gap: 10 (ref 5–15)
BUN: 8 mg/dL (ref 8–23)
CO2: 24 mmol/L (ref 22–32)
Calcium: 9 mg/dL (ref 8.9–10.3)
Chloride: 104 mmol/L (ref 98–111)
Creatinine, Ser: 0.97 mg/dL (ref 0.44–1.00)
GFR calc Af Amer: >60 mL/min (ref >60)
GFR calc non Af Amer: >60 mL/min (ref >60)
Glucose, Bld: 112 mg/dL — ABNORMAL HIGH (ref 70–99)
Potassium: 3.2 mmol/L — ABNORMAL LOW (ref 3.5–5.1)
Sodium: 138 mmol/L (ref 135–145)
Total Bilirubin: 0.4 mg/dL (ref 0.3–1.2)
Total Protein: 7.8 g/dL (ref 6.5–8.1)

## 2020-05-27 MED ORDER — SODIUM CHLORIDE 0.9 % IV BOLUS
1000.0000 mL | Freq: Once | INTRAVENOUS | Status: AC
  Administered 2020-05-27: 1000 mL via INTRAVENOUS

## 2020-05-27 MED ORDER — IOHEXOL 300 MG/ML  SOLN
100.0000 mL | Freq: Once | INTRAMUSCULAR | Status: AC | PRN
  Administered 2020-05-27: 100 mL via INTRAVENOUS

## 2020-05-27 MED ORDER — CIPROFLOXACIN HCL 500 MG PO TABS
500.0000 mg | ORAL_TABLET | Freq: Two times a day (BID) | ORAL | 0 refills | Status: DC
Start: 2020-05-27 — End: 2020-12-01

## 2020-05-27 MED ORDER — METRONIDAZOLE 500 MG PO TABS
500.0000 mg | ORAL_TABLET | Freq: Two times a day (BID) | ORAL | 0 refills | Status: DC
Start: 2020-05-27 — End: 2020-12-01

## 2020-05-27 NOTE — Discharge Instructions (Addendum)
Your CT today shows that you have colitis.  It is important that you take the antibiotics prescribed as directed until they are finished.  Someone from Dr. Blanch Media office should contact you on Monday for follow-up, call his office if you have not heard from them by Monday afternoon.  Return to the emergency department if you develop worsening symptoms such as increasing pain, fever, or vomiting

## 2020-05-27 NOTE — ED Provider Notes (Signed)
[facility] Provider Note   CSN: [id number] Arrival date & time: 05/27/20  1330     History Chief Complaint  Patient presents with  . Rectal Bleeding    Tonya Vincent is a 69 y.o. female.  HPI      Tonya Vincent is a 70 y.o. female with past medical history of anemia, type 2 diabetes, hypertension, and  diagnosed rectal cancer in 2010 and underwent lower anterior resection and underwent chemotherapy.  She also had ORIF surgery to the right ankle in May of this year.  She presents to the Emergency Department today complaining of noticing blood per rectum beginning yesterday after having a couple of small hard stools yesterday followed by diarrhea.  She describes seeing red to pink blood on the tissue and in the commode after defecation yesterday.  Today she denies further bowel movements, but states that when she stands up she feels a "gush" of blood that continues to be mixed with mucus.  She also reports decreased appetite, no vomiting or nausea.  No lightheaded sensations or dizziness upon standing.  No syncope.  She does admit to taking stool softeners and also intermittently taking narcotic pain medication due to her recent ankle surgery, but states that she does not take pain medication regularly.  She denies fever or chills, abdominal pain, dysuria, chest pain or shortness of breath.  No black or tarry stools reported.  Last colonoscopy was February of this year which she had 2 polyps removed that were benign.  PCP: Dr. Clayburn Pert GI:  Dr. Scarlette Shorts Hem/Onc:  Dr. Sophronia Simas   Past Medical History:  Diagnosis Date  . Anemia   . Anxiety   . Complication of anesthesia    difficulty waking up  . Depression   . Diabetes mellitus    type 2 DM  . Heart murmur   . History of rectal cancer 10/04/2011  . Hypertension   . Overactive bladder   . 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 Rectal cancer ([place]) dx'd 11/16/09   xrt comp 01/2010; chemo comp 01/2011  .  Tubular adenoma of colon [id number]    Patient Active Problem List   Diagnosis Date Noted  . S/P ORIF (open reduction internal fixation) fracture right ankle 04/11/20 04/21/2020  . Closed trimalleolar fracture of right ankle   . History of colonic polyps 12/18/2019  . Fecal smearing 12/18/2019  . Left arm pain 02/04/2019  . Rectal bleeding 02/06/2018  . Microcytosis 01/01/2017  . Essential hypertension 01/02/2016  . Change in stool caliber 12/31/2014  . 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 Anemia in chronic illness 12/29/2014  . History of rectal cancer 10/04/2011    Past Surgical History:  Procedure Laterality Date  . ABDOMINAL HYSTERECTOMY     for uterine fibroids  . COLON SURGERY N/A 04/24/2010   "took out half rectum"; rectal cancer  . EYE SURGERY     lasik surgery  . HAND SURGERY     carpal tunnel- left  . [place]  . ORIF ANKLE FRACTURE Right 04/11/2020   Procedure: OPEN REDUCTION INTERNAL FIXATION (ORIF) ANKLE FRACTURE;  Surgeon: Carole Civil, MD;  Location: [facility];  Service: Orthopedics;  Laterality: Right;  . PORT-A-CATH REMOVAL N/A 02/17/2019   Procedure: REMOVAL PORT-A-CATH;  Surgeon: Coralie Keens, MD;  Location: [facility];  Service: General;  Laterality: N/A;  . PORTACATH PLACEMENT     2011  . RECTAL SURGERY     2010  .  WISDOM TOOTH EXTRACTION       OB History   No obstetric history on file.     Family History  Problem Relation Age of Onset  . Leukemia Father 65  . Colon cancer Neg Hx   . Esophageal cancer Neg Hx   . Stomach cancer Neg Hx   . Rectal cancer Neg Hx     Social History   Tobacco Use  . Smoking status: Former Smoker    Packs/day: 0.50    Years: 35.00    Pack years: 17.50    Types: Cigarettes, Cigars    Quit date: 12/03/2009    Years since quitting: 10.4  . Smokeless tobacco: Never Used  Vaping Use  . Vaping Use: Never used  Substance Use Topics  . Alcohol use: Yes    Comment: socially  . Drug use: No     Home Medications Prior to Admission medications   Medication Sig Start Date End Date Taking? Authorizing Provider  amLODipine (NORVASC) 10 MG tablet Take 10 mg by mouth at bedtime.  01/24/19   [provider]  aspirin EC 81 MG tablet Take 81 mg by mouth at bedtime.     [provider]  docusate sodium (COLACE) 100 MG capsule Take 100-200 mg by mouth daily as needed for mild constipation.    [provider]  empagliflozin (JARDIANCE) 25 MG TABS tablet Take 25 mg by mouth daily with supper.     [provider]  HYDROcodone-acetaminophen (NORCO) 10-325 MG tablet Take 1 tablet by mouth every 4 (four) hours as needed. 04/26/20   Carole Civil, MD  ibuprofen (ADVIL) 800 MG tablet Take 1 tablet (800 mg total) by mouth every 8 (eight) hours as needed. 04/11/20   Carole Civil, MD  latanoprost (XALATAN) 0.005 % ophthalmic solution Place 1 drop into both eyes at bedtime.     [provider]  losartan (COZAAR) 50 MG tablet Take 50 mg by mouth daily.    [provider]  Menthol, Topical Analgesic, 16 % LIQD Apply 1 application topically 2 (two) times daily as needed (pain).    [provider]  mirabegron ER (MYRBETRIQ) 25 MG TB24 tablet Take 25 mg by mouth daily.    [provider]  Propylene Glycol 0.6 % SOLN Place 1 drop into both eyes 3 (three) times daily.     [provider]  Semaglutide (OZEMPIC, 1 MG/DOSE, [place]) Inject into the skin.    [provider]  vortioxetine HBr (TRINTELLIX) 10 MG TABS tablet Take 10 mg by mouth daily with supper.     [provider]    Allergies    Advanced hand sanitizer [alcohol], Norgesic [orphenadrine-aspirin-caffeine], and Antibacterial hand soap [triclosan]  Review of Systems   Review of Systems  Constitutional: Positive for appetite change. Negative for chills and fever.  Respiratory: Negative for chest tightness and shortness of breath.    Cardiovascular: Negative for chest pain.  Gastrointestinal: Positive for abdominal pain, anal bleeding and constipation. Negative for abdominal distention, blood in stool and nausea.  Genitourinary: Negative for decreased urine volume, difficulty urinating, dysuria and flank pain.  Musculoskeletal: Negative for back pain.  Skin: Negative for color change and rash.  Neurological: Negative for dizziness, weakness and numbness.  Hematological: Negative for adenopathy.    Physical Exam Updated Vital Signs BP (!) 167/93 (BP Location: Right Arm)   Pulse 72   Temp 98.8 F (37.1 C) (Oral)   Resp 18   Ht  5\' 3"  (1.6 m)   Wt 86.2 kg   SpO2 95%   BMI 33.66 kg/m   Physical Exam Vitals and nursing note reviewed.  Constitutional:      Appearance: Normal appearance. She is not ill-appearing or toxic-appearing.  HENT:     Mouth/Throat:     Mouth: Mucous membranes are moist.  Cardiovascular:     Rate and Rhythm: Normal rate and regular rhythm.     Pulses: Normal pulses.  Pulmonary:     Effort: Pulmonary effort is normal. No respiratory distress.  Chest:     Chest wall: No tenderness.  Abdominal:     General: There is no distension.     Palpations: Abdomen is soft.     Tenderness: There is no abdominal tenderness. There is no guarding.  Genitourinary:    Rectum: Guaiac result positive. No mass, tenderness or external hemorrhoid.     Comments:  Gross blood per rectum.  No palpable rectal masses. No rectal tenderness.  Musculoskeletal:        General: No tenderness. Normal range of motion.     Comments: Cam walker boot to the right ankle.  Distal sensation of right toes intact  Skin:    General: Skin is warm.     Capillary Refill: Capillary refill takes less than 2 seconds.     Findings: No erythema or rash.  Neurological:     General: No focal deficit present.     Mental Status: She is alert.     ED Results / Procedures / Treatments   Labs (all labs ordered are listed, but  only abnormal results are displayed) Labs Reviewed  COMPREHENSIVE METABOLIC PANEL - Abnormal; Notable for the following components:      Result Value   Potassium 3.2 (*)    Glucose, Bld 112 (*)    All other components within normal limits  CBC WITH DIFFERENTIAL/PLATELET - Abnormal; Notable for the following components:   RBC 5.21 (*)    MCH 25.9 (*)    RDW 16.6 (*)    All other components within normal limits  POC OCCULT BLOOD, ED - Abnormal; Notable for the following components:   Fecal Occult Bld POSITIVE (*)    All other components within normal limits  URINALYSIS, ROUTINE W REFLEX MICROSCOPIC    EKG None  Radiology CT ABDOMEN PELVIS W CONTRAST  Result Date: 05/27/2020 CLINICAL DATA:  Rectal bleeding and diarrhea. EXAM: CT ABDOMEN AND PELVIS WITH CONTRAST TECHNIQUE: Multidetector CT imaging of the abdomen and pelvis was performed using the standard protocol following bolus administration of intravenous contrast. CONTRAST:  161mL OMNIPAQUE IOHEXOL 300 MG/ML  SOLN COMPARISON:  December 25, 2013 FINDINGS: Lower chest: No acute abnormality. Hepatobiliary: No focal liver abnormality is seen. The enhancing liver lesions seen on the prior study are not visualized on the current exam. No gallstones, gallbladder wall thickening, or biliary dilatation. Pancreas: Unremarkable. No pancreatic ductal dilatation or surrounding inflammatory changes. Spleen: Normal in size without focal abnormality. Adrenals/Urinary Tract: Adrenal glands are unremarkable. Kidneys are normal in size, without renal calculi or hydronephrosis. A subcentimeter simple right renal cyst is seen. Bladder is unremarkable. Stomach/Bowel: Stomach is within normal limits. Appendix appears normal. Surgically anastomosed bowel is seen within the region of the distal sigmoid colon. No evidence of bowel dilatation. There is marked severity thickening of the splenic flexure and proximal descending colon with associated pericolonic  inflammatory fat stranding. Vascular/Lymphatic: There is mild calcification of the abdominal aorta. No enlarged abdominal or pelvic  lymph nodes. Reproductive: Status post hysterectomy. No adnexal masses. Other: No abdominal wall hernia or abnormality. No abdominopelvic ascites. Musculoskeletal: No acute or significant osseous findings. IMPRESSION: 1. Marked severity colitis involving the splenic flexure and proximal descending colon. 2. Evidence of prior distal sigmoid colon resection. 3. Aortic atherosclerosis. Aortic Atherosclerosis (ICD10-I70.0). Electronically Signed   By: Virgina Norfolk M.D.   On: 05/27/2020 16:59    Procedures Procedures (including critical care time)  Medications Ordered in ED Medications - No data to display  ED Course  I have reviewed the triage vital signs and the nursing notes.  Pertinent labs & imaging results that were available during my care of the patient were reviewed by me and considered in my medical decision making (see chart for details).    MDM Rules/Calculators/A&P                          Pt with hx of rectal cancer, gross blood per rectum after an episode of constipation.  Hx of similar episode.  Last colonoscopy earlier this year.  She is well appearing.  Vitals reassuring.   Pt also seen by Dr. Eulis Foster, care plan discussed.   Pt is hemodynamically stable.  No leukocytosis.  CT abd/pelvis shows colitis. Will consult GI.    Consulted Dr. Benson Norway and discussed findings.  Recommends Cipro and Flagyl and close f/u on Monday with Dr Henrene Pastor.    Final Clinical Impression(s) / ED Diagnoses Final diagnoses:  Rectal bleeding  Colitis, acute    Rx / DC Orders ED Discharge Orders    None       Kem Parkinson, PA-C 05/28/20 1246    Daleen Bo, MD 05/28/20 2024

## 2020-05-27 NOTE — ED Triage Notes (Signed)
Pt to er, pt states that she is here for bright red rectal bleeding, states that it started yesterday, states that today it is more pink.  Pt denies dizziness when standing.  States that she has had similar episodes in the past, states that she had diarrhea, and then the bleeding.

## 2020-05-27 NOTE — ED Provider Notes (Signed)
  Face-to-face evaluation   History: She is here for evaluation of rectal bleeding which started yesterday, and occurred after constipation followed by diarrhea.  No recent malaise, weakness, weight loss, dizziness.  History of rectal cancer treated with surgical excision.  Physical exam: Elderly, alert and cooperative.  Abdomen soft and nontender to palpation.  She is not in distress.  Medical screening examination/treatment/procedure(s) were conducted as a shared visit with non-physician practitioner(s) and myself.  I personally evaluated the patient during the encounter    Daleen Bo, MD 05/28/20 2024

## 2020-05-31 DIAGNOSIS — H40023 Open angle with borderline findings, high risk, bilateral: Secondary | ICD-10-CM | POA: Diagnosis not present

## 2020-06-02 ENCOUNTER — Ambulatory Visit (INDEPENDENT_AMBULATORY_CARE_PROVIDER_SITE_OTHER): Payer: PPO | Admitting: Internal Medicine

## 2020-06-02 ENCOUNTER — Encounter: Payer: Self-pay | Admitting: Internal Medicine

## 2020-06-02 VITALS — BP 150/78 | HR 78 | Ht 63.0 in | Wt 194.0 lb

## 2020-06-02 DIAGNOSIS — R918 Other nonspecific abnormal finding of lung field: Secondary | ICD-10-CM

## 2020-06-02 DIAGNOSIS — K625 Hemorrhage of anus and rectum: Secondary | ICD-10-CM

## 2020-06-02 DIAGNOSIS — A09 Infectious gastroenteritis and colitis, unspecified: Secondary | ICD-10-CM | POA: Diagnosis not present

## 2020-06-02 NOTE — Patient Instructions (Signed)
If you are age 68 or older, your body mass index should be between 23-30. Your Body mass index is 34.37 kg/m. If this is out of the aforementioned range listed, please consider follow up with your Primary Care Provider.  If you are age 28 or younger, your body mass index should be between 19-25. Your Body mass index is 34.37 kg/m. If this is out of the aformentioned range listed, please consider follow up with your Primary Care Provider.   Follow up as needed.  Thank you for entrusting me with your care and for choosing [facility], Dr. Scarlette Shorts

## 2020-06-02 NOTE — Progress Notes (Signed)
HISTORY OF PRESENT ILLNESS:  Tonya Vincent is a 69 y.o. female with a history of rectal cancer who was sent today by the emergency room regarding recent emergency room evaluation for bloody diarrhea.  I have reviewed the emergency room visit, x-rays, and laboratories.  Patient reports abrupt onset of diarrhea followed by bleeding.  This concerned her.  Emergency room evaluation revealed Hemoccult positive stool.  Laboratories were unremarkable except for hypokalemia.  Hemoglobin was normal at 13.5.  CT scan of the abdomen and pelvis with contrast revealed severe colitis involving the splenic flexure and proximal descending colon.  Evidence of prior surgery noted.  Patient denied having any pain.  She was treated with antibiotics and discharged home.  Her problem has resolved.  She did break her right ankle in early May and has a walking boot.  Patient did have her most recent colonoscopy February 2021.  In addition to diminutive adenomas which were removed she was noted to have radiation proctitis without bleeding.  GI review of systems is otherwise remarkable for bloating and belching  REVIEW OF SYSTEMS:  All non-GI ROS negative unless otherwise stated in the HPI except for back pain, visual change, depression, fatigue, night sweats, sleeping problems, shortness of breath, heart murmur  Past Medical History:  Diagnosis Date  . Anemia   . Anxiety   . Complication of anesthesia    difficulty waking up  . Depression   . Diabetes mellitus    type 2 DM  . Heart murmur   . History of rectal cancer 10/04/2011  . Hypertension   . Overactive bladder   . 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 Rectal cancer ([place]) dx'd 11/16/09   xrt comp 01/2010; chemo comp 01/2011  . Tubular adenoma of colon [id number]    Past Surgical History:  Procedure Laterality Date  . ABDOMINAL HYSTERECTOMY     for uterine fibroids  . COLON SURGERY N/A 04/24/2010   "took out half rectum"; rectal cancer  . EYE SURGERY     lasik  surgery  . HAND SURGERY     carpal tunnel- left  . [place]  . ORIF ANKLE FRACTURE Right 04/11/2020   Procedure: OPEN REDUCTION INTERNAL FIXATION (ORIF) ANKLE FRACTURE;  Surgeon: Carole Civil, MD;  Location: [facility];  Service: Orthopedics;  Laterality: Right;  . PORT-A-CATH REMOVAL N/A 02/17/2019   Procedure: REMOVAL PORT-A-CATH;  Surgeon: Coralie Keens, MD;  Location: [facility];  Service: General;  Laterality: N/A;  . PORTACATH PLACEMENT     2011  . RECTAL SURGERY     2010  . [place] EXTRACTION      Social History YARELIN REICHARDT  reports that she quit smoking about 10 years ago. Her smoking use included cigarettes and cigars. She has a 17.50 pack-year smoking history. She has never used smokeless tobacco. She reports current alcohol use. She reports that she does not use drugs.  family history includes Leukemia (age of onset: 30) in her father.  Allergies  Allergen Reactions  . Advanced Hand Sanitizer [Alcohol] Dermatitis  . Norgesic [Orphenadrine-Aspirin-Caffeine] Hives  . Antibacterial Hand Soap [Triclosan] Dermatitis       PHYSICAL EXAMINATION: Vital signs: BP (!) 150/78   Pulse 78   Ht 5\' 3"  (1.6 m)   Wt 194 lb (88 kg)   SpO2 97%   BMI 34.37 kg/m   Constitutional: generally well-appearing, no acute distress Psychiatric: alert and oriented x3, cooperative Eyes: Anicteric Mouth: Masky Abdomen: Not reexamined Rectal: Omitted  Extremities: Walking boot on right leg Skin: no lesions on visible extremities Neuro: Nonfocal  ASSESSMENT:  1.  Acute colitis (diarrhea with bleeding and abnormal CT) as described.  Most likely infectious.  Resolved after antibiotics.  Daily location is classic watershed area ischemic colitis, I doubt this given no pain 2.  History of rectal cancer 3.  Most recent surveillance colonoscopy February 2021 with diminutive adenomas 4.  Incidental radiation proctitis 5.  Abnormal CT.  As described   PLAN:  1.   Discussion regarding recent problems and reassurance regarding his prognosis 2.  Surveillance colonoscopy around February 2026 3.  Interval follow-up as needed A total time of of 20 minutes was spent preparing to see the patient, reviewing outside laboratories and CT scan, obtaining history, performing relevant aspects of the physical exam, counseling the patient regarding her above listed issue, and documenting clinical information in the health record

## 2020-07-04 ENCOUNTER — Ambulatory Visit (INDEPENDENT_AMBULATORY_CARE_PROVIDER_SITE_OTHER): Payer: PPO | Admitting: Orthopedic Surgery

## 2020-07-04 ENCOUNTER — Ambulatory Visit: Payer: PPO

## 2020-07-04 ENCOUNTER — Other Ambulatory Visit: Payer: Self-pay

## 2020-07-04 DIAGNOSIS — S82851D Displaced trimalleolar fracture of right lower leg, subsequent encounter for closed fracture with routine healing: Secondary | ICD-10-CM | POA: Diagnosis not present

## 2020-07-04 MED ORDER — HYDROCODONE-ACETAMINOPHEN 5-325 MG PO TABS: 1.0000 | ORAL_TABLET | Freq: Four times a day (QID) | ORAL | 0 refills | Status: AC | PRN

## 2020-07-04 NOTE — Progress Notes (Signed)
Chief Complaint  Patient presents with  . Ankle Pain    R/elevating it most of the time/pain ever now and then

## 2020-07-04 NOTE — Progress Notes (Signed)
POST OP APPOINTMENT  Encounter Diagnosis  Name Primary?  . Closed trimalleolar fracture of right ankle with routine healing, subsequent encounter Yes    PROCEDURE ORIF trimalleolar with bimalleolar fixation including interfrag screw and syndesmosis screw secondary to patient's history of diabetes  DOS May 10  Patient's range of motion is actually very good she is walking with a walker which she brought today but usually uses a cane  X-rays show the fractures appear to be healed mortise is intact  Recommend regular exercises advance activities as tolerated   Meds ordered this encounter  Medications  . HYDROcodone-acetaminophen (NORCO/VICODIN) 5-325 MG tablet    Sig: Take 1 tablet by mouth every 6 (six) hours as needed for up to 5 days for moderate pain.    Dispense:  20 tablet    Refill:  0    Fu 2 months check rom and gait

## 2020-09-01 ENCOUNTER — Ambulatory Visit (INDEPENDENT_AMBULATORY_CARE_PROVIDER_SITE_OTHER): Payer: PPO | Admitting: Orthopedic Surgery

## 2020-09-01 ENCOUNTER — Encounter: Payer: Self-pay | Admitting: Orthopedic Surgery

## 2020-09-01 ENCOUNTER — Other Ambulatory Visit: Payer: Self-pay

## 2020-09-01 VITALS — BP 155/88 | HR 69 | Ht 63.0 in | Wt 199.0 lb

## 2020-09-01 DIAGNOSIS — Z8781 Personal history of (healed) traumatic fracture: Secondary | ICD-10-CM

## 2020-09-01 DIAGNOSIS — Z9889 Other specified postprocedural states: Secondary | ICD-10-CM | POA: Diagnosis not present

## 2020-09-01 MED ORDER — TRAMADOL-ACETAMINOPHEN 37.5-325 MG PO TABS: 1.0000 | ORAL_TABLET | ORAL | 0 refills | Status: DC | PRN

## 2020-09-01 NOTE — Patient Instructions (Signed)
HCAP FORM  STOCKINGS FOR SWELLING   CANE AS NEEDED   ADVIL FOR PAIN FIRST IF NEEDED ADD ULTRACET

## 2020-09-01 NOTE — Progress Notes (Signed)
Chief Complaint  Patient presents with  . Ankle Pain    Rt ankle after surgery DOS 04/11/20   4-1/2 months after internal fixation bimalleolar ankle fracture  Main complaint is swelling, mild arch pain occasionally and she has a bruise on the tibial crest which she is concerned about.  She is ambulating better using a cane occasionally occasional shooting pain in the ankle as well.  Exam shows some swelling no warmth neurovascular intact ankle range of motion is very good  Recommend TED stockings handicap sticker cane as needed follow-up for 1 year x-ray at the anniversary of surgery  Encounter Diagnosis  Name Primary?  . S/P ORIF (open reduction internal fixation) fracture right ankle 04/11/20 Yes

## 2020-09-02 ENCOUNTER — Telehealth: Payer: Self-pay | Admitting: Orthopedic Surgery

## 2020-09-02 NOTE — Telephone Encounter (Signed)
Patient came by office today - relays that she did not receive handicapped placard after visit with Dr Aline Brochure yesterday. Form printed by Abigail Butts from [employer]. Patient aware form is in Dr Harrison's box. (patient aware Dr is in surgery today) and that we will call her when form is ready.

## 2020-09-05 ENCOUNTER — Ambulatory Visit: Payer: PPO | Admitting: Orthopedic Surgery

## 2020-09-07 ENCOUNTER — Telehealth: Payer: Self-pay | Admitting: Orthopedic Surgery

## 2020-09-07 NOTE — Telephone Encounter (Signed)
Patient came by this morning to pick up her handicap sticker and said that she stopped taking the Tramadol.  She said it caused her to have severe itching.  She said she took Benadryl.    She asks for something else in place of the Tramadol  She uses [facility] on [address]. She uses

## 2020-09-08 ENCOUNTER — Other Ambulatory Visit: Payer: Self-pay | Admitting: Orthopedic Surgery

## 2020-09-08 MED ORDER — IBUPROFEN 800 MG PO TABS: 800.0000 mg | ORAL_TABLET | Freq: Three times a day (TID) | ORAL | 1 refills | Status: DC | PRN

## 2020-10-06 ENCOUNTER — Ambulatory Visit: Payer: PPO | Attending: Internal Medicine

## 2020-10-06 DIAGNOSIS — Z23 Encounter for immunization: Secondary | ICD-10-CM

## 2020-10-06 NOTE — Progress Notes (Signed)
   Covid-19 Vaccination Clinic  Name:  Tonya Vincent    MRN: [id number] DOB: 07/04/1951  10/06/2020  [name] was observed post Covid-19 immunization for 15 minutes without incident. She was provided with Vaccine Information Sheet and instruction to access the V-Safe system.   [name] was instructed to call 911 with any severe reactions post vaccine: Marland Kitchen Difficulty breathing  . Swelling of face and throat  . A fast heartbeat  . A bad rash all over body  . Dizziness and weakness

## 2020-11-24 DIAGNOSIS — F331 Major depressive disorder, recurrent, moderate: Secondary | ICD-10-CM | POA: Diagnosis not present

## 2020-11-24 DIAGNOSIS — F902 Attention-deficit hyperactivity disorder, combined type: Secondary | ICD-10-CM | POA: Diagnosis not present

## 2020-11-24 DIAGNOSIS — E1169 Type 2 diabetes mellitus with other specified complication: Secondary | ICD-10-CM | POA: Diagnosis not present

## 2020-11-24 DIAGNOSIS — I10 Essential (primary) hypertension: Secondary | ICD-10-CM | POA: Diagnosis not present

## 2020-11-24 DIAGNOSIS — F423 Hoarding disorder: Secondary | ICD-10-CM | POA: Diagnosis not present

## 2020-11-29 ENCOUNTER — Telehealth: Payer: Self-pay | Admitting: Orthopedic Surgery

## 2020-11-29 NOTE — Telephone Encounter (Signed)
Patient called to relay she has had a recent new injury to same (operative) foot/ankle; said tripped on steps several days ago, and thought it would get better, but is still hurting - states no treatment done as of yet. Requests appointment for this week with Dr Romeo Apple. Patient is status/post surgery right ankle/trimalleor fracture, date of surgery 04/11/20.  Okay to use one of the slots on hold?

## 2020-11-29 NOTE — Telephone Encounter (Signed)
Done; Patient aware of scheduled appointment. °

## 2020-11-29 NOTE — Telephone Encounter (Signed)
Yes

## 2020-11-30 DIAGNOSIS — Z1231 Encounter for screening mammogram for malignant neoplasm of breast: Secondary | ICD-10-CM | POA: Diagnosis not present

## 2020-12-01 ENCOUNTER — Ambulatory Visit: Payer: PPO

## 2020-12-01 ENCOUNTER — Encounter: Payer: Self-pay | Admitting: Orthopedic Surgery

## 2020-12-01 ENCOUNTER — Other Ambulatory Visit: Payer: Self-pay

## 2020-12-01 ENCOUNTER — Ambulatory Visit (INDEPENDENT_AMBULATORY_CARE_PROVIDER_SITE_OTHER): Payer: PPO | Admitting: Orthopedic Surgery

## 2020-12-01 VITALS — BP 137/84 | HR 92 | Ht 63.0 in | Wt 199.0 lb

## 2020-12-01 DIAGNOSIS — Z9889 Other specified postprocedural states: Secondary | ICD-10-CM

## 2020-12-01 DIAGNOSIS — M25571 Pain in right ankle and joints of right foot: Secondary | ICD-10-CM

## 2020-12-01 DIAGNOSIS — S93401A Sprain of unspecified ligament of right ankle, initial encounter: Secondary | ICD-10-CM

## 2020-12-01 DIAGNOSIS — Z8781 Personal history of (healed) traumatic fracture: Secondary | ICD-10-CM | POA: Diagnosis not present

## 2020-12-01 NOTE — Patient Instructions (Signed)
Ankle Fracture Rehab Following ankle fractures, a loss of ankle mobility and muscle strength and endurance can occur. The following exercises will help resolve these problems. Ask your health care provider which exercises are safe for you. Do exercises exactly as told by your health care provider and adjust them as directed. It is normal to feel mild stretching, pulling, tightness, or discomfort as you do these exercises. Stop right away if you feel sudden pain or your pain gets worse. Do not begin these exercises until told by your health care provider. Stretching and range-of-motion exercises These exercises warm up your muscles and joints and improve the movement and flexibility of your knee. These exercises may also help to relieve pain and swelling. Passive ankle eversion   This is an exercise in which you use your hands to move your ankle away from you. Your ankle does not move on its own. 1. Sit with your left / right ankle crossed over your opposite knee. 2. Hold your left / right foot with your opposite hand. 3. Gently push the top of your foot downward so your big toe moves away from you and toward the floor (eversion). Keep your lower leg steady. You should feel a gentle stretch on the inside of your ankle. 4. Hold the stretch for ____2______ seconds. Repeat ____10______ times. Complete this exercise ______2____ times a day. Passive ankle inversion   This is an exercise in which you use your hands to move your ankle toward you. Your ankle does not move on its own. 1. Sit with your left / right ankle crossed over your opposite knee. 2. Hold your left / right foot with your opposite hand. 3. Gently pull your foot upward toward you (inversion). Keep your lower leg steady. Your big toe will move toward you and toward the ceiling. You should feel a gentle stretch on the outside of your ankle. 4. Hold the stretch for _____2_____ seconds. Repeat _____10_____ times. Complete this exercise  _____2_____ times a day. Ankle alphabet   1. Sit with your left / right lower leg supported at the lower leg. ? Do not rest your foot on anything. ? Make sure your foot has room to move freely. 2. Think of your left / right foot as a paintbrush. ? Move your foot to trace each letter of the alphabet in the air. Keep your hip and knee still while you trace. ? Make the letters as large as you can without feeling discomfort. 3. Trace every letter from A to Z. Repeat __________ times. Complete this exercise __________ times a day. Gastrocnemius   This exercise is also called calf stretch. It stretches the muscles in the back of the lower leg. 1. Sit on the floor with your left / right leg straight. 2. Loop a belt or towel around the ball of your left / right foot. 3. Keep your left / right ankle and foot relaxed and keep your knee straight while you use the belt or towel to pull your foot and ankle toward you. You should feel a gentle stretch behind your ankle or in your calf. 4. Hold this position for _____2_____ seconds. Repeat _______10___ times. Complete this exercise ______2____ times a day. Strengthening exercises These exercises build strength and endurance in your foot and ankle. Endurance is the ability to use your muscles for a long time, even after they get tired. Towel curls   1. Sit in a chair on a non-carpeted surface, and put your feet on the floor.  2. Place a towel in front of your feet. If told by your health care provider, add __________ to the end of the towel. 3. Keeping your heel on the floor, put your left / right foot on the towel. 4. Pull the towel toward you by grabbing the towel with your toes and curling them under. Keep your heel on the floor. 5. Let your toes relax. 6. Grab the towel with your toes again. Keep going until the towel is completely underneath your foot. Repeat _____15_____ times. Complete this exercise _____2_____ times a day. This information is  not intended to replace advice given to you by your health care provider. Make sure you discuss any questions you have with your health care provider. Document Released: 06/20/2005 Document Revised: 03/10/2019 Document Reviewed: 08/19/2018 Elsevier Patient Education  2020 [employer].

## 2020-12-01 NOTE — Progress Notes (Signed)
NEW PROBLEM//OFFICE VISIT  Summary assessment and plan:  Intact hardware suspect ankle sprain, anterior talofibular ligament tender as is the posterior area of the fibula near the peroneal tendons but they seem to be intact.  Treat for ankle sprain with ankle range of motion exercises and ASO brace and follow-up in 5 weeks   Chief Complaint  Patient presents with  . Ankle Injury right ankle    Missed a step in November has had increased pain since right ankle/ s/p ORIF 04/11/20    69 year old female status post trimalleolar ankle fracture status post ORIF with bimalleolar fixation including syndesmosis screw reinjured the right ankle presents for evaluation and treatment S/p orif (open reduction internal fixation) fracture right ankle 04/11/20      Review of Systems  Constitutional: Negative for chills and fever.  Skin: Negative for itching and rash.  Neurological: Negative for tingling.     Past Medical History:  Diagnosis Date  . Anemia   . Anxiety   . Complication of anesthesia    difficulty waking up  . Depression   . Diabetes mellitus    type 2 DM  . Heart murmur   . History of rectal cancer 10/04/2011  . Hypertension   . Overactive bladder   . 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 Rectal cancer ([facility]) dx'd 11/16/09   xrt comp 01/2010; chemo comp 01/2011  . Tubular adenoma of colon [id number]    Past Surgical History:  Procedure Laterality Date  . ABDOMINAL HYSTERECTOMY     for uterine fibroids  . COLON SURGERY N/A 04/24/2010   "took out half rectum"; rectal cancer  . EYE SURGERY     lasik surgery  . HAND SURGERY     carpal tunnel- left  . HEMORRHOID SURGERY N/A 1980  . ORIF ANKLE FRACTURE Right 04/11/2020   Procedure: OPEN REDUCTION INTERNAL FIXATION (ORIF) ANKLE FRACTURE;  Surgeon: Vickki Hearing, MD;  Location: [facility];  Service: Orthopedics;  Laterality: Right;  . PORT-A-CATH REMOVAL N/A 02/17/2019   Procedure: REMOVAL PORT-A-CATH;  Surgeon: Abigail Miyamoto, MD;   Location: [facility];  Service: General;  Laterality: N/A;  . PORTACATH PLACEMENT     2011  . RECTAL SURGERY     2010  . WISDOM TOOTH EXTRACTION      Family History  Problem Relation Age of Onset  . Leukemia Father 14  . Colon cancer Neg Hx   . Esophageal cancer Neg Hx   . Stomach cancer Neg Hx   . Rectal cancer Neg Hx    Social History   Tobacco Use  . Smoking status: Former Smoker    Packs/day: 0.50    Years: 35.00    Pack years: 17.50    Types: Cigarettes, Cigars    Quit date: 12/03/2009    Years since quitting: 11.0  . Smokeless tobacco: Never Used  Vaping Use  . Vaping Use: Never used  Substance Use Topics  . Alcohol use: Yes    Comment: socially  . Drug use: No    Allergies  Allergen Reactions  . Advanced Hand Sanitizer [Alcohol] Dermatitis  . Norgesic [Orphenadrine-Aspirin-Caffeine] Hives  . Tramadol Itching  . Antibacterial Hand Soap [Triclosan] Dermatitis    Current Meds  Medication Sig  . amLODipine (NORVASC) 10 MG tablet Take 10 mg by mouth at bedtime.   Marland Kitchen aspirin EC 81 MG tablet Take 81 mg by mouth at bedtime.   . empagliflozin (JARDIANCE) 25 MG TABS tablet Take 25 mg by mouth daily with  supper.   . latanoprost (XALATAN) 0.005 % ophthalmic solution Place 1 drop into both eyes at bedtime.   Marland Kitchen losartan (COZAAR) 50 MG tablet Take 50 mg by mouth daily.  . mirabegron ER (MYRBETRIQ) 25 MG TB24 tablet Take 50 mg by mouth daily.   . Semaglutide, 1 MG/DOSE, 2 MG/1.5ML SOPN Inject 1 mg into the skin every 7 (seven) days.   Marland Kitchen vortioxetine HBr (TRINTELLIX) 10 MG TABS tablet Take 10 mg by mouth daily with supper.     BP 137/84   Pulse 92   Ht 5\' 3"  (1.6 m)   Wt 199 lb (90.3 kg)   BMI 35.25 kg/m   Physical Exam Constitutional:      General: She is not in acute distress.    Appearance: She is well-developed.  Cardiovascular:     Comments: No peripheral edema Skin:    General: Skin is warm and dry.     Capillary Refill: Capillary refill takes less than 2  seconds.  Neurological:     Mental Status: She is alert and oriented to person, place, and time.     Sensory: No sensory deficit.     Coordination: Coordination normal.     Gait: Gait normal.     Deep Tendon Reflexes: Reflexes are normal and symmetric.  Psychiatric:        Mood and Affect: Mood normal.        Thought Content: Thought content normal.        Judgment: Judgment normal.     Right Ankle Exam   Tenderness  Right ankle tenderness location: Tenderness in the peroneal tendons as well as the anterior talofibular ligament. Swelling: none  Range of Motion  Dorsiflexion: normal  Plantar flexion: normal  Eversion: normal  Inversion: normal   Muscle Strength  Dorsiflexion:  5/5 Plantar flexion:  5/5 Anterior tibial:  5/5 Posterior tibial:  5/5 Gastrocsoleus:  5/5 Peroneal muscle:  5/5  Other  Erythema: absent Sensation: normal Pulse: present         MEDICAL DECISION MAKING  A.  Encounter Diagnoses  Name Primary?  . S/P ORIF (open reduction internal fixation) fracture right ankle 04/11/20 Yes  . Pain in right ankle and joints of right foot     B. DATA ANALYSED:   IMAGING: Interpretation of images: Internal images show intact ankle fixation with some heterotopic bone in the syndesmosis area no loss of fixation ankle mortise intact   Orders: None  Outside records reviewed: None   C. MANAGEMENT   ASO bracing and ankle range of motion exercises follow-up in 5 weeks recheck  No orders of the defined types were placed in this encounter.     06/11/20, MD  12/01/2020 10:51 AM

## 2020-12-06 DIAGNOSIS — H401131 Primary open-angle glaucoma, bilateral, mild stage: Secondary | ICD-10-CM | POA: Diagnosis not present

## 2020-12-06 DIAGNOSIS — H25013 Cortical age-related cataract, bilateral: Secondary | ICD-10-CM | POA: Diagnosis not present

## 2020-12-06 DIAGNOSIS — H524 Presbyopia: Secondary | ICD-10-CM | POA: Diagnosis not present

## 2021-01-05 ENCOUNTER — Ambulatory Visit: Payer: PPO | Admitting: Orthopedic Surgery

## 2021-01-05 DIAGNOSIS — Z8781 Personal history of (healed) traumatic fracture: Secondary | ICD-10-CM

## 2021-02-21 DIAGNOSIS — E1169 Type 2 diabetes mellitus with other specified complication: Secondary | ICD-10-CM | POA: Diagnosis not present

## 2021-02-21 DIAGNOSIS — I10 Essential (primary) hypertension: Secondary | ICD-10-CM | POA: Diagnosis not present

## 2021-02-21 DIAGNOSIS — R69 Illness, unspecified: Secondary | ICD-10-CM | POA: Diagnosis not present

## 2021-02-21 DIAGNOSIS — R635 Abnormal weight gain: Secondary | ICD-10-CM | POA: Diagnosis not present

## 2021-03-14 DIAGNOSIS — R42 Dizziness and giddiness: Secondary | ICD-10-CM | POA: Diagnosis not present

## 2021-03-14 DIAGNOSIS — Z0000 Encounter for general adult medical examination without abnormal findings: Secondary | ICD-10-CM | POA: Diagnosis not present

## 2021-03-14 DIAGNOSIS — R69 Illness, unspecified: Secondary | ICD-10-CM | POA: Diagnosis not present

## 2021-03-14 DIAGNOSIS — E1169 Type 2 diabetes mellitus with other specified complication: Secondary | ICD-10-CM | POA: Diagnosis not present

## 2021-03-14 DIAGNOSIS — M130 Polyarthritis, unspecified: Secondary | ICD-10-CM | POA: Diagnosis not present

## 2021-04-06 DIAGNOSIS — Z23 Encounter for immunization: Secondary | ICD-10-CM | POA: Diagnosis not present

## 2021-04-13 ENCOUNTER — Ambulatory Visit: Payer: Medicare HMO

## 2021-04-13 ENCOUNTER — Encounter: Payer: Self-pay | Admitting: Orthopedic Surgery

## 2021-04-13 ENCOUNTER — Other Ambulatory Visit: Payer: Self-pay

## 2021-04-13 ENCOUNTER — Ambulatory Visit (INDEPENDENT_AMBULATORY_CARE_PROVIDER_SITE_OTHER): Payer: Medicare HMO | Admitting: Orthopedic Surgery

## 2021-04-13 VITALS — BP 147/82 | HR 79 | Ht 63.0 in | Wt 208.0 lb

## 2021-04-13 DIAGNOSIS — S82851D Displaced trimalleolar fracture of right lower leg, subsequent encounter for closed fracture with routine healing: Secondary | ICD-10-CM

## 2021-04-13 DIAGNOSIS — Z9889 Other specified postprocedural states: Secondary | ICD-10-CM | POA: Diagnosis not present

## 2021-04-13 DIAGNOSIS — Z8781 Personal history of (healed) traumatic fracture: Secondary | ICD-10-CM

## 2021-04-13 NOTE — Progress Notes (Signed)
FOLLOW-UP OFFICE VISIT   Encounter Diagnoses  Name Primary?  . S/P ORIF (open reduction internal fixation) fracture right ankle 04/11/20 Yes  . Closed trimalleolar fracture of right ankle with routine healing, subsequent encounter     70 YO F 1 YEAR POST OP ORIF RIGHT ANKLE   No complaints other than swelling.  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

## 2021-04-13 NOTE — Patient Instructions (Signed)
Everything looks good

## 2021-04-25 DIAGNOSIS — E1169 Type 2 diabetes mellitus with other specified complication: Secondary | ICD-10-CM | POA: Diagnosis not present

## 2021-04-25 DIAGNOSIS — I10 Essential (primary) hypertension: Secondary | ICD-10-CM | POA: Diagnosis not present

## 2021-04-25 DIAGNOSIS — R69 Illness, unspecified: Secondary | ICD-10-CM | POA: Diagnosis not present

## 2021-06-12 DIAGNOSIS — H25013 Cortical age-related cataract, bilateral: Secondary | ICD-10-CM | POA: Diagnosis not present

## 2021-06-14 IMAGING — DX DG ANKLE COMPLETE 3+V*R*
3 series · 3 of 3 positions shown · non-contrast
Comparison: 08/26/2012

CLINICAL DATA: Right ankle pain after fall

EXAM:
RIGHT ANKLE - COMPLETE 3+ VIEW

[ankle ap]
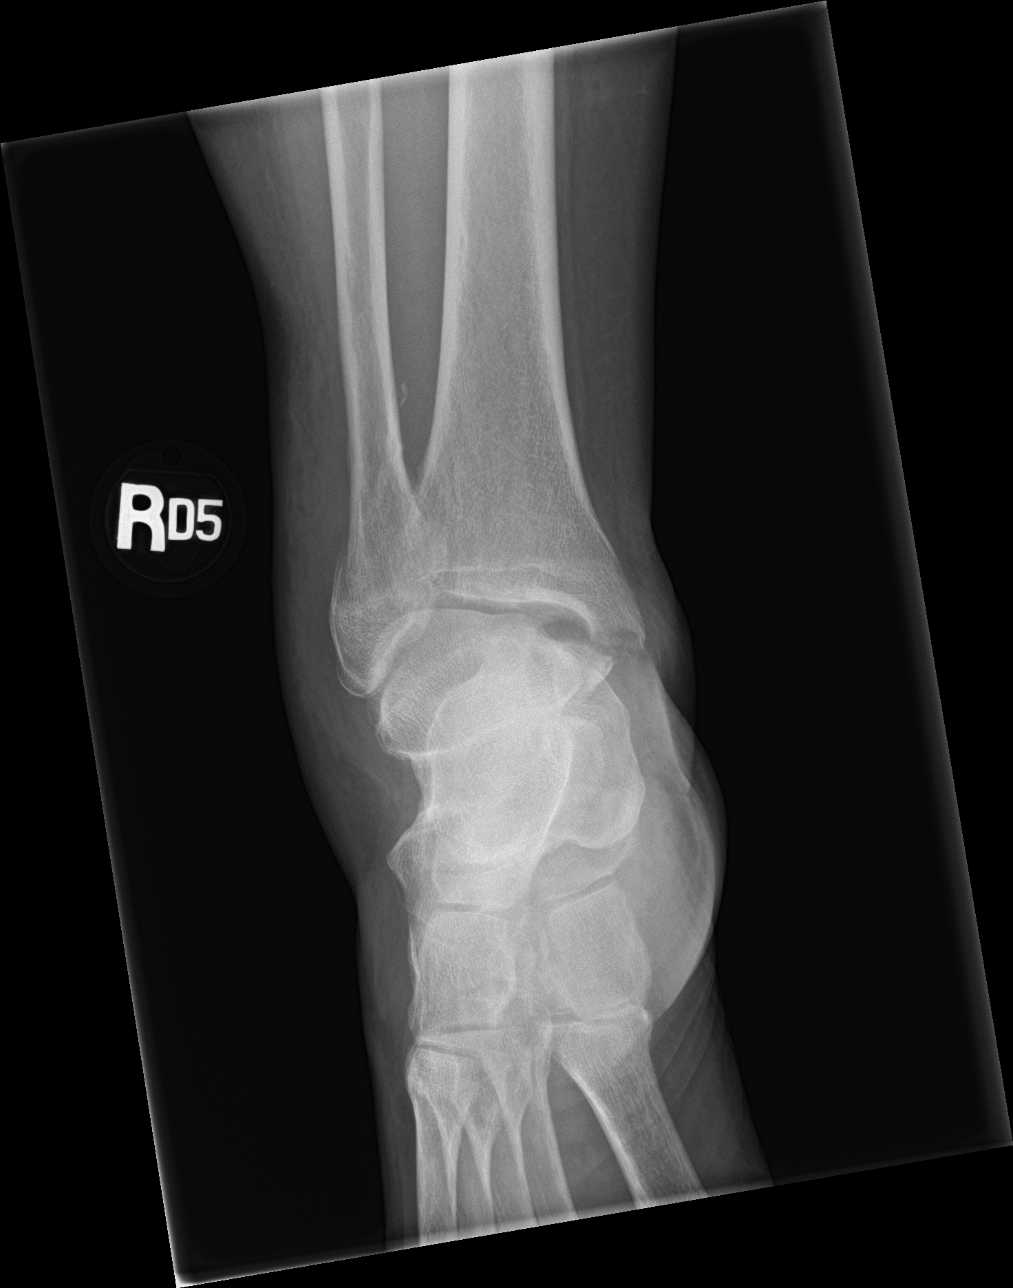

[ankle obl]
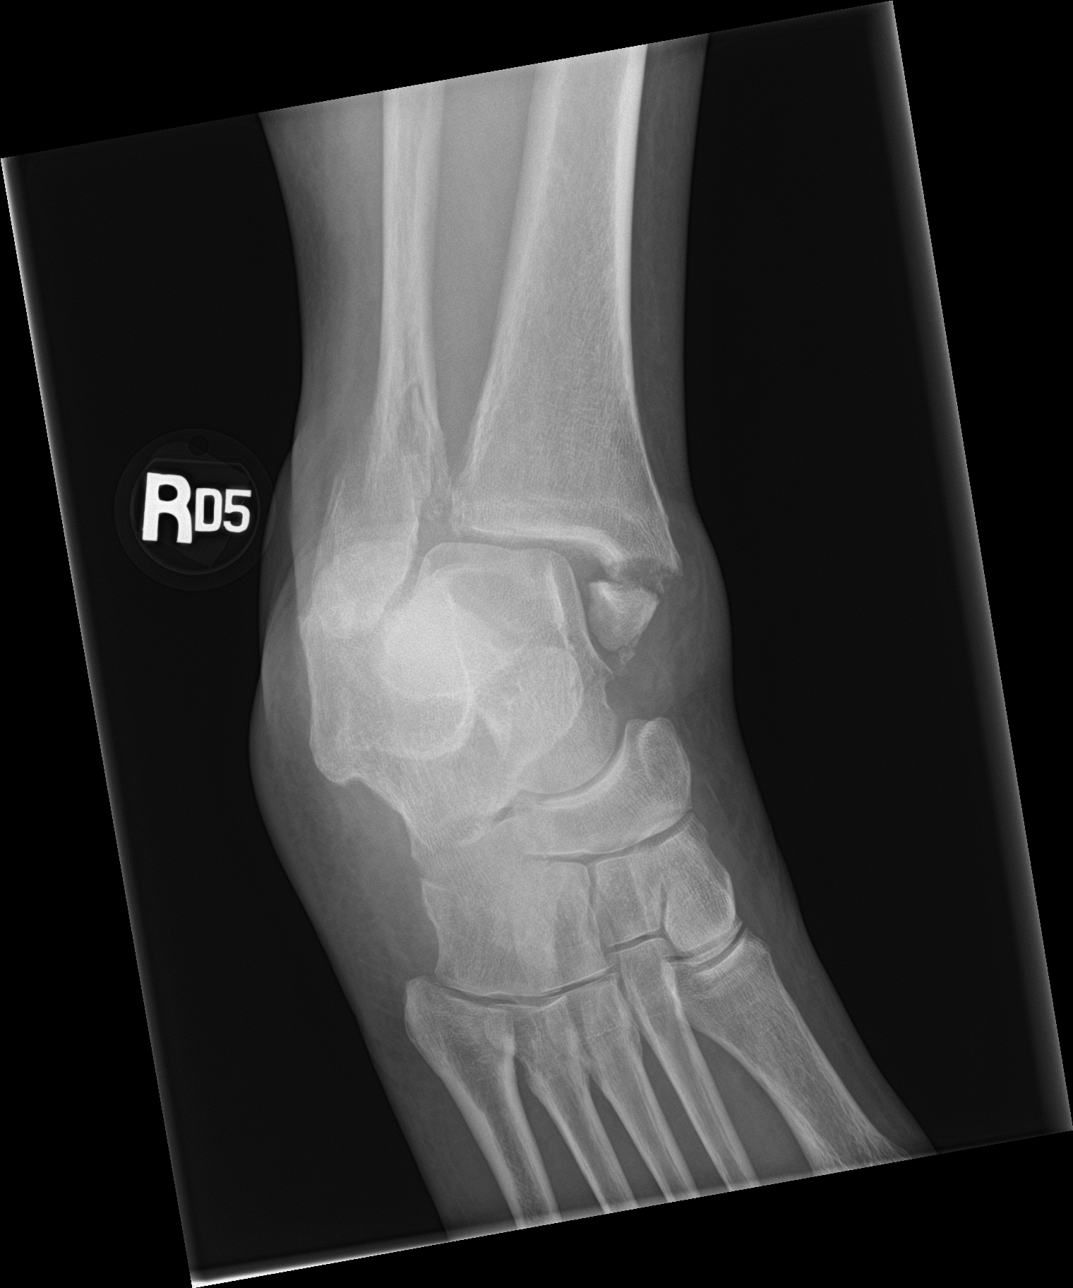

[ankle lat]
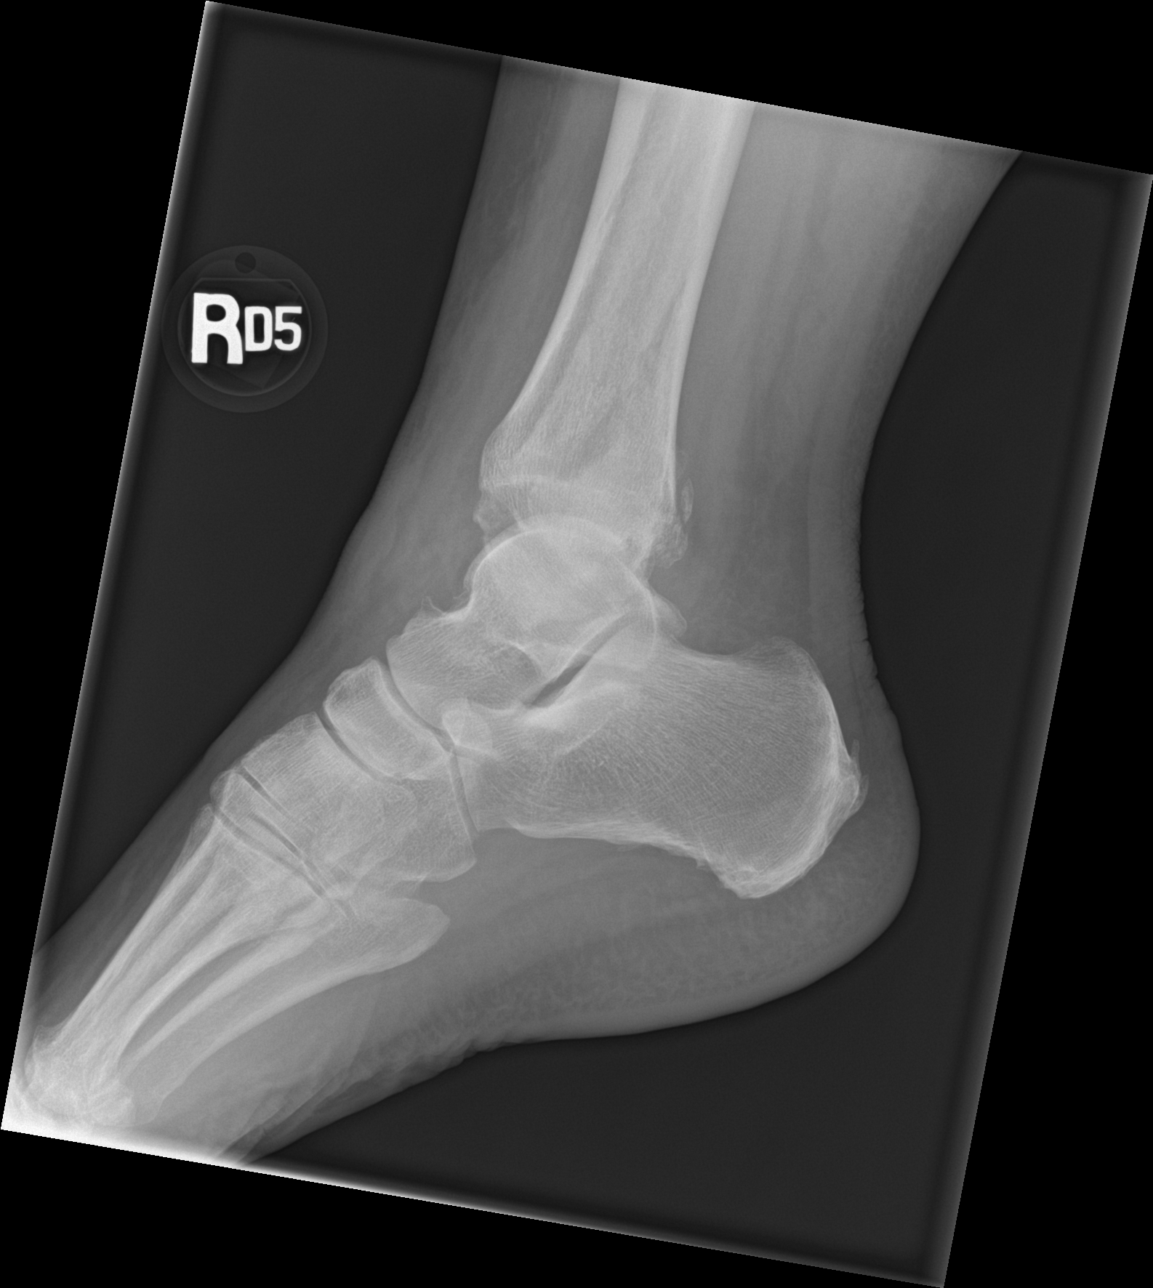

[3 of 3 positions shown; findings below may reference images not displayed]

FINDINGS: Acute trimalleolar fracture of the right ankle including a mildly
displaced transversely oriented fracture of the medial malleolus
with 5 mm of lateral displacement. Oblique fractures through the
distal fibular metaphysis extending into the ankle mortise with up
to 5 mm of lateral displacement. Small minimally displaced
vertically oriented fracture of the posterior malleolus. Talar dome
is laterally subluxed relative to the tibial plafond without
dislocation. Circumferential soft tissue swelling about the ankle.
Alignment of the subtalar joints and midfoot appears anatomic.
IMPRESSION: Acute trimalleolar fracture of the right ankle with lateral
subluxation of the talar dome relative to the tibial plafond.

## 2021-06-14 IMAGING — DX DG ANKLE 2V *R*
2 series · 2 of 2 positions shown · non-contrast
Comparison: 04/05/2020

CLINICAL DATA: Post reduction

EXAM:
RIGHT ANKLE - 2 VIEW

[ankle ap]
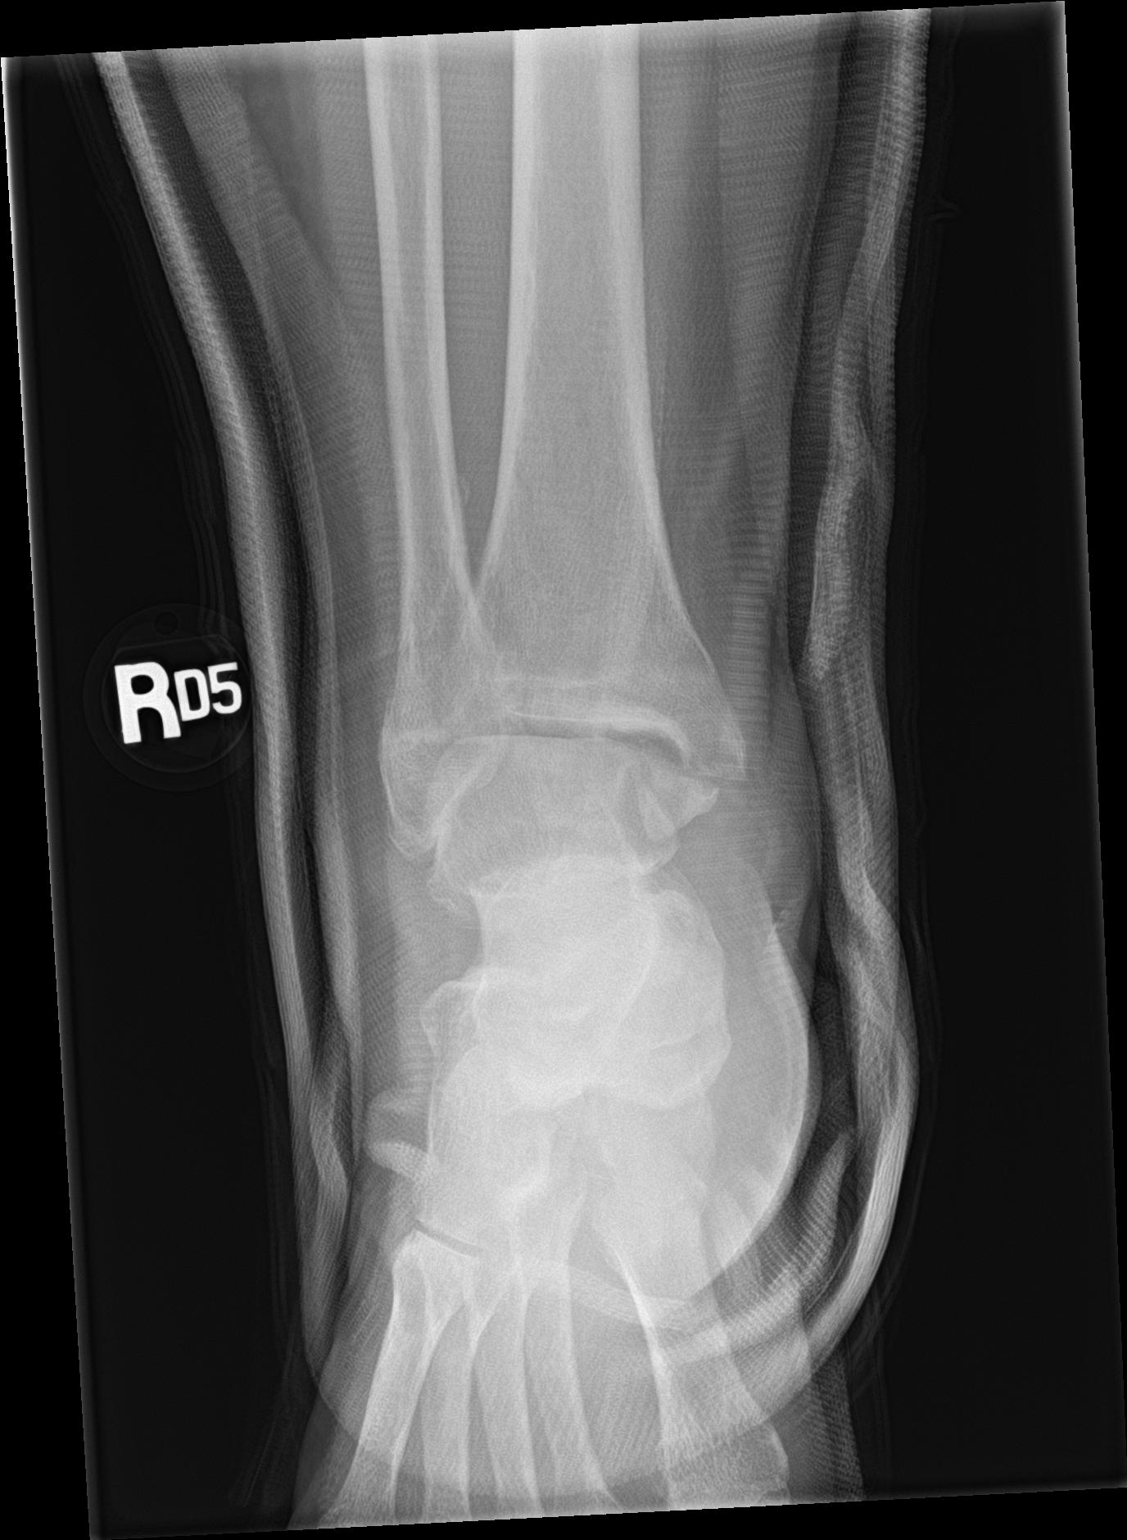

[ankle lat]
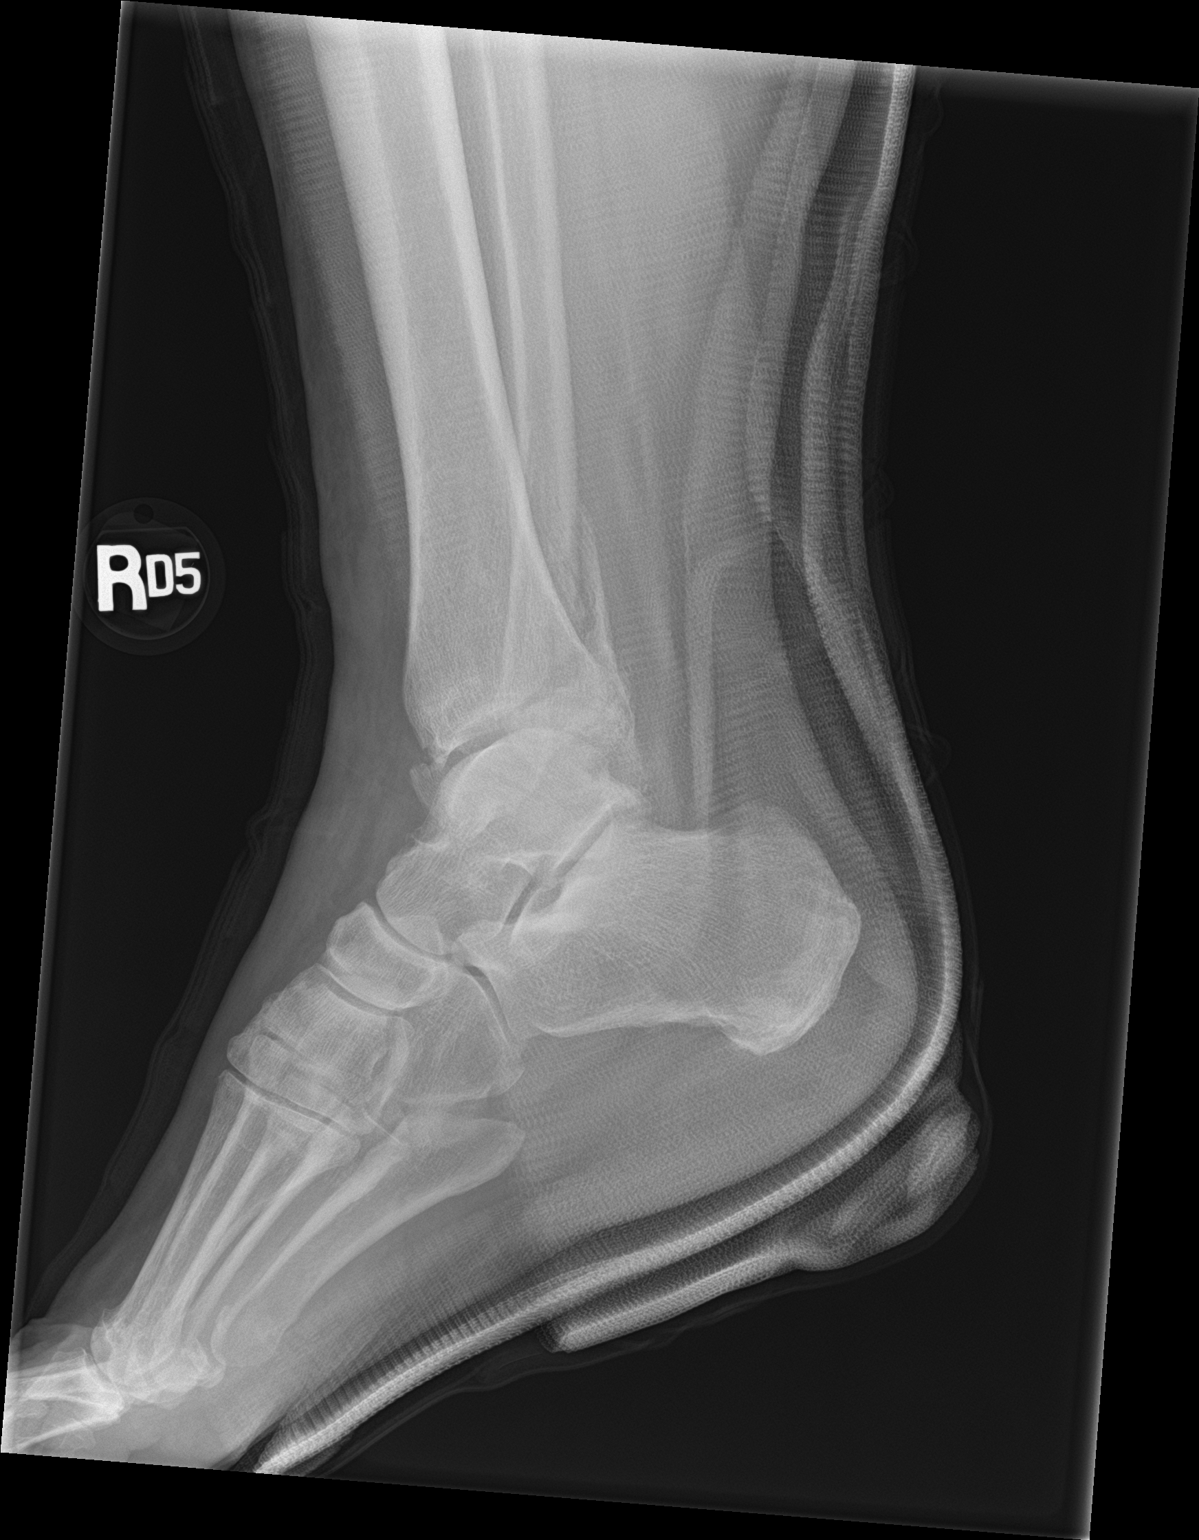

[2 of 2 positions shown; findings below may reference images not displayed]

FINDINGS: Casting material limits bone detail. Residual lateral subluxation of
the talar dome with respect to the distal tibia. Acute displaced
medial malleolar fracture. Decreased displacement of distal fibular
fracture. No significant displacement of posterior malleolar
fracture fragments. Possible tiny anterior articular fracture off
the distal tibia.
IMPRESSION: 1. Residual lateral subluxation of the talar dome with respect to
the distal tibia.
2. Displaced medial malleolar fracture. Decreased displacement of
distal fibular fracture. No significant displacement of posterior
malleolar fracture.
3. Possible small anterior articular surface fracture fragments off
the distal tibia.

## 2021-06-20 DIAGNOSIS — R69 Illness, unspecified: Secondary | ICD-10-CM | POA: Diagnosis not present

## 2021-06-20 DIAGNOSIS — I10 Essential (primary) hypertension: Secondary | ICD-10-CM | POA: Diagnosis not present

## 2021-06-20 DIAGNOSIS — E1169 Type 2 diabetes mellitus with other specified complication: Secondary | ICD-10-CM | POA: Diagnosis not present

## 2021-06-21 DIAGNOSIS — I10 Essential (primary) hypertension: Secondary | ICD-10-CM | POA: Diagnosis not present

## 2021-06-21 DIAGNOSIS — E1169 Type 2 diabetes mellitus with other specified complication: Secondary | ICD-10-CM | POA: Diagnosis not present

## 2021-06-21 DIAGNOSIS — E039 Hypothyroidism, unspecified: Secondary | ICD-10-CM | POA: Diagnosis not present

## 2021-06-21 DIAGNOSIS — I63543 Cerebral infarction due to unspecified occlusion or stenosis of bilateral cerebellar arteries: Secondary | ICD-10-CM | POA: Diagnosis not present

## 2021-06-28 ENCOUNTER — Other Ambulatory Visit (HOSPITAL_COMMUNITY): Payer: Self-pay | Admitting: Family Medicine

## 2021-06-28 DIAGNOSIS — R0602 Shortness of breath: Secondary | ICD-10-CM

## 2021-07-06 ENCOUNTER — Other Ambulatory Visit (HOSPITAL_COMMUNITY): Payer: Medicare HMO

## 2021-07-14 ENCOUNTER — Other Ambulatory Visit (HOSPITAL_COMMUNITY): Payer: Medicare HMO

## 2021-07-21 DIAGNOSIS — R809 Proteinuria, unspecified: Secondary | ICD-10-CM | POA: Diagnosis not present

## 2021-07-27 ENCOUNTER — Other Ambulatory Visit: Payer: Self-pay

## 2021-07-27 ENCOUNTER — Ambulatory Visit (HOSPITAL_COMMUNITY)
Admission: RE | Admit: 2021-07-27 | Discharge: 2021-07-27 | Disposition: A | Discharge status: Home / Self Care | Payer: Medicare HMO | Source: Ambulatory Visit | Attending: Family Medicine | Admitting: Family Medicine

## 2021-07-27 DIAGNOSIS — R0602 Shortness of breath: Secondary | ICD-10-CM | POA: Diagnosis not present

## 2021-07-27 LAB — ECHOCARDIOGRAM COMPLETE
AR max vel: 1.5 cm2
AV Area VTI: 1.41 cm2
AV Area mean vel: 1.56 cm2
AV Mean grad: 5.7 mmHg
AV Peak grad: 12.2 mmHg
Ao pk vel: 1.74 m/s
Area-P 1/2: 3.37 cm2
S' Lateral: 1.9 cm

## 2021-07-27 NOTE — Progress Notes (Signed)
*  PRELIMINARY RESULTS* Echocardiogram 2D Echocardiogram has been performed.  Tonya Vincent 07/27/2021, 2:31 PM

## 2021-08-02 DIAGNOSIS — R69 Illness, unspecified: Secondary | ICD-10-CM | POA: Diagnosis not present

## 2021-08-02 DIAGNOSIS — K219 Gastro-esophageal reflux disease without esophagitis: Secondary | ICD-10-CM | POA: Diagnosis not present

## 2021-08-02 DIAGNOSIS — E1169 Type 2 diabetes mellitus with other specified complication: Secondary | ICD-10-CM | POA: Diagnosis not present

## 2021-08-02 DIAGNOSIS — I10 Essential (primary) hypertension: Secondary | ICD-10-CM | POA: Diagnosis not present

## 2021-09-01 DIAGNOSIS — I10 Essential (primary) hypertension: Secondary | ICD-10-CM | POA: Diagnosis not present

## 2021-09-01 DIAGNOSIS — K219 Gastro-esophageal reflux disease without esophagitis: Secondary | ICD-10-CM | POA: Diagnosis not present

## 2021-09-01 DIAGNOSIS — E1169 Type 2 diabetes mellitus with other specified complication: Secondary | ICD-10-CM | POA: Diagnosis not present

## 2021-09-01 DIAGNOSIS — R69 Illness, unspecified: Secondary | ICD-10-CM | POA: Diagnosis not present

## 2021-10-03 DIAGNOSIS — Z1231 Encounter for screening mammogram for malignant neoplasm of breast: Secondary | ICD-10-CM | POA: Diagnosis not present

## 2021-10-06 DIAGNOSIS — E7800 Pure hypercholesterolemia, unspecified: Secondary | ICD-10-CM | POA: Diagnosis not present

## 2021-10-06 DIAGNOSIS — R69 Illness, unspecified: Secondary | ICD-10-CM | POA: Diagnosis not present

## 2021-10-06 DIAGNOSIS — Z87891 Personal history of nicotine dependence: Secondary | ICD-10-CM | POA: Diagnosis not present

## 2021-10-06 DIAGNOSIS — I10 Essential (primary) hypertension: Secondary | ICD-10-CM | POA: Diagnosis not present

## 2021-10-06 DIAGNOSIS — E1169 Type 2 diabetes mellitus with other specified complication: Secondary | ICD-10-CM | POA: Diagnosis not present

## 2021-11-09 DIAGNOSIS — Z20822 Contact with and (suspected) exposure to covid-19: Secondary | ICD-10-CM | POA: Diagnosis not present

## 2021-11-09 DIAGNOSIS — I10 Essential (primary) hypertension: Secondary | ICD-10-CM | POA: Diagnosis not present

## 2021-11-09 DIAGNOSIS — E7800 Pure hypercholesterolemia, unspecified: Secondary | ICD-10-CM | POA: Diagnosis not present

## 2021-11-09 DIAGNOSIS — E1169 Type 2 diabetes mellitus with other specified complication: Secondary | ICD-10-CM | POA: Diagnosis not present

## 2021-12-19 DIAGNOSIS — H25013 Cortical age-related cataract, bilateral: Secondary | ICD-10-CM | POA: Diagnosis not present

## 2021-12-19 DIAGNOSIS — H40023 Open angle with borderline findings, high risk, bilateral: Secondary | ICD-10-CM | POA: Diagnosis not present

## 2021-12-26 DIAGNOSIS — M130 Polyarthritis, unspecified: Secondary | ICD-10-CM | POA: Diagnosis not present

## 2021-12-26 DIAGNOSIS — I10 Essential (primary) hypertension: Secondary | ICD-10-CM | POA: Diagnosis not present

## 2021-12-26 DIAGNOSIS — Z6837 Body mass index (BMI) 37.0-37.9, adult: Secondary | ICD-10-CM | POA: Diagnosis not present

## 2021-12-26 DIAGNOSIS — F423 Hoarding disorder: Secondary | ICD-10-CM | POA: Diagnosis not present

## 2021-12-26 DIAGNOSIS — R69 Illness, unspecified: Secondary | ICD-10-CM | POA: Diagnosis not present

## 2021-12-26 DIAGNOSIS — E1169 Type 2 diabetes mellitus with other specified complication: Secondary | ICD-10-CM | POA: Diagnosis not present

## 2022-01-02 DIAGNOSIS — M25562 Pain in left knee: Secondary | ICD-10-CM | POA: Diagnosis not present

## 2022-01-03 DIAGNOSIS — H25811 Combined forms of age-related cataract, right eye: Secondary | ICD-10-CM | POA: Diagnosis not present

## 2022-01-03 DIAGNOSIS — H2511 Age-related nuclear cataract, right eye: Secondary | ICD-10-CM | POA: Diagnosis not present

## 2022-02-20 DIAGNOSIS — E7800 Pure hypercholesterolemia, unspecified: Secondary | ICD-10-CM | POA: Diagnosis not present

## 2022-02-20 DIAGNOSIS — R69 Illness, unspecified: Secondary | ICD-10-CM | POA: Diagnosis not present

## 2022-02-20 DIAGNOSIS — E1169 Type 2 diabetes mellitus with other specified complication: Secondary | ICD-10-CM | POA: Diagnosis not present

## 2022-02-20 DIAGNOSIS — N189 Chronic kidney disease, unspecified: Secondary | ICD-10-CM | POA: Diagnosis not present

## 2022-02-20 DIAGNOSIS — I10 Essential (primary) hypertension: Secondary | ICD-10-CM | POA: Diagnosis not present

## 2022-04-24 DIAGNOSIS — R69 Illness, unspecified: Secondary | ICD-10-CM | POA: Diagnosis not present

## 2022-04-24 DIAGNOSIS — Z0000 Encounter for general adult medical examination without abnormal findings: Secondary | ICD-10-CM | POA: Diagnosis not present

## 2022-04-24 DIAGNOSIS — E1169 Type 2 diabetes mellitus with other specified complication: Secondary | ICD-10-CM | POA: Diagnosis not present

## 2022-04-24 DIAGNOSIS — I10 Essential (primary) hypertension: Secondary | ICD-10-CM | POA: Diagnosis not present

## 2022-04-24 DIAGNOSIS — M130 Polyarthritis, unspecified: Secondary | ICD-10-CM | POA: Diagnosis not present

## 2022-05-10 DIAGNOSIS — H25012 Cortical age-related cataract, left eye: Secondary | ICD-10-CM | POA: Diagnosis not present

## 2022-05-16 DIAGNOSIS — H2512 Age-related nuclear cataract, left eye: Secondary | ICD-10-CM | POA: Diagnosis not present

## 2022-05-16 DIAGNOSIS — H269 Unspecified cataract: Secondary | ICD-10-CM | POA: Diagnosis not present

## 2022-05-24 DIAGNOSIS — E1169 Type 2 diabetes mellitus with other specified complication: Secondary | ICD-10-CM | POA: Diagnosis not present

## 2022-05-24 DIAGNOSIS — R69 Illness, unspecified: Secondary | ICD-10-CM | POA: Diagnosis not present

## 2022-05-24 DIAGNOSIS — I10 Essential (primary) hypertension: Secondary | ICD-10-CM | POA: Diagnosis not present

## 2022-06-01 DIAGNOSIS — E1169 Type 2 diabetes mellitus with other specified complication: Secondary | ICD-10-CM | POA: Diagnosis not present

## 2022-06-01 DIAGNOSIS — I10 Essential (primary) hypertension: Secondary | ICD-10-CM | POA: Diagnosis not present

## 2022-06-08 DIAGNOSIS — R69 Illness, unspecified: Secondary | ICD-10-CM | POA: Diagnosis not present

## 2022-06-08 DIAGNOSIS — E1169 Type 2 diabetes mellitus with other specified complication: Secondary | ICD-10-CM | POA: Diagnosis not present

## 2022-06-20 DIAGNOSIS — E785 Hyperlipidemia, unspecified: Secondary | ICD-10-CM | POA: Diagnosis not present

## 2022-06-20 DIAGNOSIS — H409 Unspecified glaucoma: Secondary | ICD-10-CM | POA: Diagnosis not present

## 2022-06-20 DIAGNOSIS — R69 Illness, unspecified: Secondary | ICD-10-CM | POA: Diagnosis not present

## 2022-06-20 DIAGNOSIS — I10 Essential (primary) hypertension: Secondary | ICD-10-CM | POA: Diagnosis not present

## 2022-06-20 DIAGNOSIS — R32 Unspecified urinary incontinence: Secondary | ICD-10-CM | POA: Diagnosis not present

## 2022-06-20 DIAGNOSIS — Z806 Family history of leukemia: Secondary | ICD-10-CM | POA: Diagnosis not present

## 2022-06-20 DIAGNOSIS — Z7982 Long term (current) use of aspirin: Secondary | ICD-10-CM | POA: Diagnosis not present

## 2022-06-20 DIAGNOSIS — Z7984 Long term (current) use of oral hypoglycemic drugs: Secondary | ICD-10-CM | POA: Diagnosis not present

## 2022-06-20 DIAGNOSIS — Z6836 Body mass index (BMI) 36.0-36.9, adult: Secondary | ICD-10-CM | POA: Diagnosis not present

## 2022-06-20 DIAGNOSIS — E119 Type 2 diabetes mellitus without complications: Secondary | ICD-10-CM | POA: Diagnosis not present

## 2022-06-20 DIAGNOSIS — Z7985 Long-term (current) use of injectable non-insulin antidiabetic drugs: Secondary | ICD-10-CM | POA: Diagnosis not present

## 2022-08-02 DIAGNOSIS — I10 Essential (primary) hypertension: Secondary | ICD-10-CM | POA: Diagnosis not present

## 2022-08-02 DIAGNOSIS — E1169 Type 2 diabetes mellitus with other specified complication: Secondary | ICD-10-CM | POA: Diagnosis not present

## 2022-08-02 DIAGNOSIS — K219 Gastro-esophageal reflux disease without esophagitis: Secondary | ICD-10-CM | POA: Diagnosis not present

## 2022-08-30 ENCOUNTER — Telehealth: Payer: Self-pay | Admitting: *Deleted

## 2022-08-30 NOTE — Chronic Care Management (AMB) (Signed)
  Care Coordination  Outreach Note  08/30/2022 Name: Tonya Vincent MRN: [id number] DOB: 1951-06-26   Care Coordination Outreach Attempts: An unsuccessful telephone outreach was attempted today to offer the patient information about available care coordination services as a benefit of their health plan.   Follow Up Plan:  Additional outreach attempts will be made to offer the patient care coordination information and services.   Encounter Outcome:  No Answer  [place]  Direct Dial: [phone]

## 2022-09-04 DIAGNOSIS — R69 Illness, unspecified: Secondary | ICD-10-CM | POA: Diagnosis not present

## 2022-09-04 DIAGNOSIS — F329 Major depressive disorder, single episode, unspecified: Secondary | ICD-10-CM | POA: Diagnosis not present

## 2022-09-04 NOTE — Chronic Care Management (AMB) (Signed)
  Care Coordination  Outreach Note  09/04/2022 Name: Tonya Vincent MRN: [id number] DOB: 1951-02-10   Care Coordination Outreach Attempts: A second unsuccessful outreach was attempted today to offer the patient with information about available care coordination services as a benefit of their health plan.     Follow Up Plan:  Additional outreach attempts will be made to offer the patient care coordination information and services.   Encounter Outcome:  No Answer  [place]  Direct Dial: [phone]

## 2022-09-11 NOTE — Chronic Care Management (AMB) (Signed)
  Care Coordination  Outreach Note  09/11/2022 Name: Tonya Vincent MRN: [id number] DOB: [birth year]   Care Coordination Outreach Attempts: A third unsuccessful outreach was attempted today to offer the patient with information about available care coordination services as a benefit of their health plan.   Follow Up Plan:  No further outreach attempts will be made at this time. We have been unable to contact the patient to offer or enroll patient in care coordination services  Encounter Outcome:  No Answer  [place]: [phone]

## 2022-10-03 DIAGNOSIS — C50919 Malignant neoplasm of unspecified site of unspecified female breast: Secondary | ICD-10-CM

## 2022-10-03 HISTORY — DX: Malignant neoplasm of unspecified site of unspecified female breast: C50.919

## 2022-10-12 DIAGNOSIS — N6325 Unspecified lump in the left breast, overlapping quadrants: Secondary | ICD-10-CM | POA: Diagnosis not present

## 2022-10-12 DIAGNOSIS — N6321 Unspecified lump in the left breast, upper outer quadrant: Secondary | ICD-10-CM | POA: Diagnosis not present

## 2022-10-12 DIAGNOSIS — N644 Mastodynia: Secondary | ICD-10-CM | POA: Diagnosis not present

## 2022-10-13 LAB — GLUCOSE, POCT (MANUAL RESULT ENTRY): POC Glucose: 135 mg/dl — AB (ref 70–99)

## 2022-10-18 ENCOUNTER — Other Ambulatory Visit: Payer: Self-pay

## 2022-10-18 DIAGNOSIS — Z170 Estrogen receptor positive status [ER+]: Secondary | ICD-10-CM | POA: Diagnosis not present

## 2022-10-18 DIAGNOSIS — N6325 Unspecified lump in the left breast, overlapping quadrants: Secondary | ICD-10-CM | POA: Diagnosis not present

## 2022-10-18 DIAGNOSIS — N6321 Unspecified lump in the left breast, upper outer quadrant: Secondary | ICD-10-CM | POA: Diagnosis not present

## 2022-10-18 DIAGNOSIS — C50412 Malignant neoplasm of upper-outer quadrant of left female breast: Secondary | ICD-10-CM | POA: Diagnosis not present

## 2022-10-18 DIAGNOSIS — C50812 Malignant neoplasm of overlapping sites of left female breast: Secondary | ICD-10-CM | POA: Diagnosis not present

## 2022-10-22 ENCOUNTER — Encounter: Payer: Self-pay | Admitting: Family Medicine

## 2022-10-23 ENCOUNTER — Telehealth: Payer: Self-pay | Admitting: Hematology and Oncology

## 2022-10-23 ENCOUNTER — Encounter: Payer: Self-pay | Admitting: *Deleted

## 2022-10-23 DIAGNOSIS — Z170 Estrogen receptor positive status [ER+]: Secondary | ICD-10-CM

## 2022-10-23 DIAGNOSIS — C50412 Malignant neoplasm of upper-outer quadrant of left female breast: Secondary | ICD-10-CM | POA: Insufficient documentation

## 2022-10-23 NOTE — Telephone Encounter (Signed)
Spoke to patient to confirm upcoming afternoon [facility] clinic appointment  on 11/29 paperwork will be sent via [place].   Gave location and time, also informed patient that the surgeon's office would be calling as well to get information from them similar to the packet that they will be receiving so make sure to do both.  Reminded patient that all providers will be coming to the clinic to see them HERE and if they had any questions to not hesitate to reach back out to myself or their navigators.

## 2022-10-30 ENCOUNTER — Other Ambulatory Visit: Payer: Self-pay | Admitting: *Deleted

## 2022-10-30 DIAGNOSIS — Z170 Estrogen receptor positive status [ER+]: Secondary | ICD-10-CM | POA: Insufficient documentation

## 2022-10-30 NOTE — Progress Notes (Signed)
Radiation Oncology         [phone] ________________________________  Multidisciplinary Breast Oncology Clinic [facility]) Initial Outpatient Consultation  Name: Tonya Vincent MRN: [id number]  Date: 10/31/2022  DOB: [birth year]  PJ:ASNKN, Myra Rude, MD  Coralie Keens, MD   REFERRING PHYSICIAN: Coralie Keens, MD  DIAGNOSIS: There were no encounter diagnoses.  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  HISTORY OF PRESENT ILLNESS::Tonya Vincent is a 71 y.o. female who is presenting to the office today for evaluation of her newly diagnosed breast cancer. She is accompanied by ***. She is doing well overall.   The patient presented with bilateral diffuse intermittent breast pain x 4-6 weeks. This prompted a bilateral diagnostic mammogram a [facility] on 10/12/22 showing an indeterminate architectural distortion in the upper outer left breast (posterior depth) measuring 3.0 x 2.8 x 3.1 cm. A 0.5 cm irregular mass was also appreciated in the superior left breast at a posterior depth. No other abnormalities in either breast were appreciated. Ultrasound on that same date further revealed the 2 o'clock left breast mass as suspicious of malignancy (measuring 0.8 cm sonographically). The 3 o'clock left breast mass also appeared suspicious of malignancy, (measuring 0.4 x 0.4 x 0.2 cm sonographically). [place] otherwise showed no abnormal appearing left axillary lymph nodes.   Biopsies of the 3 o'clock and 2 o'clock left breast masses on 10/18/22 showed: grade 2 invasive lobular carcinoma with LCIS measuring 4 mm and 6.5 mm in the greatest linear extent of the samples (respectively). Prognostic indicators for the 3 o'clock mass significant for: estrogen receptor, 95% positive and progesterone receptor, 5% positive, both with strong  staining intensity. Proliferation marker Ki67 at 40%. HER2 positive. Prognostic indicators for the 2 o'clock mass significant for: estrogen receptor, 95% positive and progesterone receptor, 70% positive, both with strong staining intensity. Proliferation marker Ki67 at 15%. HER2 negative.   Menarche: *** years old Age at first live birth: *** years old GP: *** LMP: *** Contraceptive: *** HRT: ***   The patient was referred today for presentation in the multidisciplinary conference.  Radiology studies and pathology slides were presented there for review and discussion of treatment options.  A consensus was discussed regarding potential next steps.  PREVIOUS RADIATION THERAPY: {EXAM; YES/NO:19492::"No"}  PAST MEDICAL HISTORY:  Past Medical History:  Diagnosis Date   Anemia    Anxiety    Complication of anesthesia    difficulty waking up   Depression    Diabetes mellitus    type 2 DM   Heart murmur    History of rectal cancer 10/04/2011   Hypertension    Overactive bladder    Paraspinal mass 12/29/2014   Rectal cancer ([place]) dx'd 11/16/09   xrt comp 01/2010; chemo comp 01/2011   Tubular adenoma of colon [id number]    PAST SURGICAL HISTORY: Past Surgical History:  Procedure Laterality Date   ABDOMINAL HYSTERECTOMY     for uterine fibroids   COLON SURGERY N/A 04/24/2010   "took out half rectum"; rectal cancer   EYE SURGERY     lasik surgery   HAND SURGERY     carpal tunnel- left   HEMORRHOID SURGERY N/A 1980   ORIF ANKLE FRACTURE Right 04/11/2020   Procedure: OPEN REDUCTION INTERNAL FIXATION (ORIF) ANKLE FRACTURE;  Surgeon: Carole Civil,  MD;  Location: [facility];  Service: Orthopedics;  Laterality: Right;   PORT-A-CATH REMOVAL N/A 02/17/2019   Procedure: REMOVAL PORT-A-CATH;  Surgeon: Coralie Keens, MD;  Location: [facility];  Service: General;  Laterality: N/A;   PORTACATH PLACEMENT     2011   RECTAL SURGERY     2010   WISDOM TOOTH EXTRACTION      FAMILY HISTORY:   Family History  Problem Relation Age of Onset   Leukemia Father 81   Colon cancer Neg Hx    Esophageal cancer Neg Hx    Stomach cancer Neg Hx    Rectal cancer Neg Hx     SOCIAL HISTORY:  Social History   Socioeconomic History   Marital status: Single    Spouse name: Not on file   Number of children: Not on file   Years of education: Not on file   Highest education level: Not on file  Occupational History   Not on file  Tobacco Use   Smoking status: Former    Packs/day: 0.50    Years: 35.00    Total pack years: 17.50    Types: Cigarettes, Cigars    Quit date: 12/03/2009    Years since quitting: 12.9   Smokeless tobacco: Never  Vaping Use   Vaping Use: Never used  Substance and Sexual Activity   Alcohol use: Yes    Comment: socially   Drug use: No   Sexual activity: Not on file  Other Topics Concern   Not on file  Social History Narrative   Not on file   Social Determinants of Health   Financial Resource Strain: Not on file  Food Insecurity: Not on file  Transportation Needs: Not on file  Physical Activity: Not on file  Stress: Not on file  Social Connections: Not on file    ALLERGIES:  Allergies  Allergen Reactions   Advanced Hand Sanitizer [Alcohol] Dermatitis   Norgesic [Orphenadrine-Aspirin-Caffeine] Hives   Tramadol Itching   Antibacterial Hand Soap [Triclosan] Dermatitis    MEDICATIONS:  Current Outpatient Medications  Medication Sig Dispense Refill   amLODipine (NORVASC) 10 MG tablet Take 10 mg by mouth at bedtime.      aspirin EC 81 MG tablet Take 81 mg by mouth at bedtime.      empagliflozin (JARDIANCE) 25 MG TABS tablet Take 25 mg by mouth daily with supper.      latanoprost (XALATAN) 0.005 % ophthalmic solution Place 1 drop into both eyes at bedtime.      olmesartan (BENICAR) 40 MG tablet Take 40 mg by mouth daily.     Semaglutide, 1 MG/DOSE, 2 MG/1.5ML SOPN Inject 1 mg into the skin every 7 (seven) days.      spironolactone (ALDACTONE) 25  MG tablet Take 1 tablet by mouth 2 (two) times daily.     vortioxetine HBr (TRINTELLIX) 10 MG TABS tablet Take 10 mg by mouth daily with supper.  (Patient not taking: Reported on 04/13/2021)     No current facility-administered medications for this encounter.    REVIEW OF SYSTEMS: A 10+ POINT REVIEW OF SYSTEMS WAS OBTAINED including neurology, dermatology, psychiatry, cardiac, respiratory, lymph, extremities, GI, GU, musculoskeletal, constitutional, reproductive, HEENT. On the provided form, she reports ***. She denies *** and any other symptoms.    PHYSICAL EXAM:  vitals were not taken for this visit.  {may need to copy over vitals} Lungs are clear to auscultation bilaterally. Heart has regular rate and rhythm. No palpable cervical, supraclavicular, or axillary  adenopathy. Abdomen soft, non-tender, normal bowel sounds. Breast: *** breast with no palpable mass, nipple discharge, or bleeding. *** breast with ***.   KPS = ***  100 - Normal; no complaints; no evidence of disease. 90   - Able to carry on normal activity; minor signs or symptoms of disease. 80   - Normal activity with effort; some signs or symptoms of disease. 66   - Cares for self; unable to carry on normal activity or to do active work. 60   - Requires occasional assistance, but is able to care for most of his personal needs. 50   - Requires considerable assistance and frequent medical care. 55   - Disabled; requires special care and assistance. 68   - Severely disabled; hospital admission is indicated although death not imminent. 79   - Very sick; hospital admission necessary; active supportive treatment necessary. 10   - Moribund; fatal processes progressing rapidly. 0     - Dead  Karnofsky DA, Abelmann [place], Craver LS and Burchenal JH [phone]) The use of the nitrogen mustards in the palliative treatment of carcinoma: with particular reference to bronchogenic carcinoma Cancer 1 634-56  LABORATORY DATA:  Lab Results  Component  Value Date   WBC 6.0 05/27/2020   HGB 13.5 05/27/2020   HCT 43.9 05/27/2020   MCV 84.3 05/27/2020   PLT 303 05/27/2020   Lab Results  Component Value Date   NA 138 05/27/2020   K 3.2 (L) 05/27/2020   CL 104 05/27/2020   CO2 24 05/27/2020   Lab Results  Component Value Date   ALT 15 05/27/2020   AST 16 05/27/2020   ALKPHOS 112 05/27/2020   BILITOT 0.4 05/27/2020    PULMONARY FUNCTION TEST:   Review Flowsheet        No data to display          RADIOGRAPHY: No results found.    IMPRESSION: ***  Patient will be a good candidate for breast conservation with radiotherapy to the left breast. We discussed the general course of radiation, potential side effects, and toxicities with radiation and the patient is interested in this approach. ***   PLAN:  ***   ------------------------------------------------  Blair Promise, PhD, MD  This document serves as a record of services personally performed by Gery Pray, MD. It was created on his behalf by Roney Mans, a trained medical scribe. The creation of this record is based on the scribe's personal observations and the provider's statements to them. This document has been checked and approved by the attending provider.

## 2022-10-31 ENCOUNTER — Inpatient Hospital Stay (HOSPITAL_BASED_OUTPATIENT_CLINIC_OR_DEPARTMENT_OTHER): Payer: Medicare HMO | Admitting: Hematology and Oncology

## 2022-10-31 ENCOUNTER — Other Ambulatory Visit: Payer: Self-pay

## 2022-10-31 ENCOUNTER — Other Ambulatory Visit: Payer: Self-pay | Admitting: *Deleted

## 2022-10-31 ENCOUNTER — Ambulatory Visit
Admission: RE | Admit: 2022-10-31 | Discharge: 2022-10-31 | Disposition: A | Discharge status: Home / Self Care | Payer: Medicare HMO | Source: Ambulatory Visit | Attending: Radiation Oncology | Admitting: Radiation Oncology

## 2022-10-31 ENCOUNTER — Encounter: Payer: Self-pay | Admitting: Physical Therapy

## 2022-10-31 ENCOUNTER — Other Ambulatory Visit: Payer: Self-pay | Admitting: Surgery

## 2022-10-31 ENCOUNTER — Encounter: Payer: Self-pay | Admitting: *Deleted

## 2022-10-31 ENCOUNTER — Inpatient Hospital Stay (HOSPITAL_BASED_OUTPATIENT_CLINIC_OR_DEPARTMENT_OTHER): Payer: Medicare HMO | Admitting: Genetic Counselor

## 2022-10-31 ENCOUNTER — Inpatient Hospital Stay: Payer: Medicare HMO | Attending: Hematology and Oncology

## 2022-10-31 ENCOUNTER — Ambulatory Visit: Payer: Medicare HMO | Attending: Surgery | Admitting: Physical Therapy

## 2022-10-31 VITALS — BP 147/86 | HR 79 | Temp 97.9°F | Resp 16 | Ht 64.0 in | Wt 199.0 lb

## 2022-10-31 DIAGNOSIS — Z853 Personal history of malignant neoplasm of breast: Secondary | ICD-10-CM

## 2022-10-31 DIAGNOSIS — Z79899 Other long term (current) drug therapy: Secondary | ICD-10-CM | POA: Diagnosis not present

## 2022-10-31 DIAGNOSIS — Z170 Estrogen receptor positive status [ER+]: Secondary | ICD-10-CM

## 2022-10-31 DIAGNOSIS — M25511 Pain in right shoulder: Secondary | ICD-10-CM | POA: Diagnosis not present

## 2022-10-31 DIAGNOSIS — C50412 Malignant neoplasm of upper-outer quadrant of left female breast: Secondary | ICD-10-CM | POA: Diagnosis not present

## 2022-10-31 DIAGNOSIS — Z85048 Personal history of other malignant neoplasm of rectum, rectosigmoid junction, and anus: Secondary | ICD-10-CM | POA: Diagnosis not present

## 2022-10-31 DIAGNOSIS — R293 Abnormal posture: Secondary | ICD-10-CM | POA: Insufficient documentation

## 2022-10-31 DIAGNOSIS — C50912 Malignant neoplasm of unspecified site of left female breast: Secondary | ICD-10-CM | POA: Diagnosis not present

## 2022-10-31 DIAGNOSIS — Z923 Personal history of irradiation: Secondary | ICD-10-CM | POA: Diagnosis not present

## 2022-10-31 DIAGNOSIS — C50512 Malignant neoplasm of lower-outer quadrant of left female breast: Secondary | ICD-10-CM

## 2022-10-31 DIAGNOSIS — Z803 Family history of malignant neoplasm of breast: Secondary | ICD-10-CM

## 2022-10-31 LAB — CBC WITH DIFFERENTIAL (CANCER CENTER ONLY)
Abs Immature Granulocytes: 0.01 10*3/uL (ref 0.00–0.07)
Basophils Absolute: 0.1 10*3/uL (ref 0.0–0.1)
Basophils Relative: 1 %
Eosinophils Absolute: 0.2 10*3/uL (ref 0.0–0.5)
Eosinophils Relative: 4 %
HCT: 44.2 % (ref 36.0–46.0)
Hemoglobin: 14.1 g/dL (ref 12.0–15.0)
Immature Granulocytes: 0 %
Lymphocytes Relative: 32 %
Lymphs Abs: 1.6 10*3/uL (ref 0.7–4.0)
MCH: 26.8 pg (ref 26.0–34.0)
MCHC: 31.9 g/dL (ref 30.0–36.0)
MCV: 83.9 fL (ref 80.0–100.0)
Monocytes Absolute: 0.7 10*3/uL (ref 0.1–1.0)
Monocytes Relative: 13 %
Neutro Abs: 2.6 10*3/uL (ref 1.7–7.7)
Neutrophils Relative %: 50 %
Platelet Count: 338 10*3/uL (ref 150–400)
RBC: 5.27 MIL/uL — ABNORMAL HIGH (ref 3.87–5.11)
RDW: 16.2 % — ABNORMAL HIGH (ref 11.5–15.5)
WBC Count: 5.1 10*3/uL (ref 4.0–10.5)
nRBC: 0 % (ref 0.0–0.2)

## 2022-10-31 LAB — CMP (CANCER CENTER ONLY)
ALT: 16 U/L (ref 0–44)
AST: 14 U/L — ABNORMAL LOW (ref 15–41)
Albumin: 4.6 g/dL (ref 3.5–5.0)
Alkaline Phosphatase: 101 U/L (ref 38–126)
Anion gap: 7 (ref 5–15)
BUN: 12 mg/dL (ref 8–23)
CO2: 27 mmol/L (ref 22–32)
Calcium: 9.9 mg/dL (ref 8.9–10.3)
Chloride: 107 mmol/L (ref 98–111)
Creatinine: 1.22 mg/dL — ABNORMAL HIGH (ref 0.44–1.00)
GFR, Estimated: 48 mL/min — ABNORMAL LOW (ref >60)
Glucose, Bld: 101 mg/dL — ABNORMAL HIGH (ref 70–99)
Potassium: 4.4 mmol/L (ref 3.5–5.1)
Sodium: 141 mmol/L (ref 135–145)
Total Bilirubin: 0.3 mg/dL (ref 0.3–1.2)
Total Protein: 8.4 g/dL — ABNORMAL HIGH (ref 6.5–8.1)

## 2022-10-31 LAB — GENETIC SCREENING ORDER

## 2022-10-31 NOTE — Progress Notes (Addendum)
[name] NOTE  Patient Care Team: [name], MD as PCP - General (Family Medicine) [name], MD as Consulting Physician (Hematology and Oncology) Rockwell Germany, RN as Oncology Nurse Navigator Mauro Kaufmann, RN as Oncology Nurse Navigator Nicholas Lose, MD as Consulting Physician (Hematology and Oncology) Coralie Keens, MD as Consulting Physician (General Surgery) Gery Pray, MD as Consulting Physician (Radiation Oncology)  CHIEF COMPLAINTS/PURPOSE OF CONSULTATION:  Newly diagnosed breast cancer  HISTORY OF PRESENTING ILLNESS:  Tonya Vincent 71 y.o. female is here because of recent diagnosis of left breast cancer.  Patient was complaining of diffuse bilateral breast pains.  Mammogram revealed distortion of 3 cm size.  Ultrasound revealed 2 masses 0.4 cm in 0.8 cm.  Axilla was negative.  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  The second mass revealed also grade 2 ILC that was ER/PR positive HER2 negative with a Ki-67 of 15%.  She was presented this morning to the multidisciplinary tumor board and she is here today to discuss her treatment plan.  I reviewed her records extensively and collaborated the history with the patient.  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 after neoadjuvant chemotherapy and radiation therapy   Primary site: Colon and Rectum   Staging method: AJCC 7th Edition   Clinical: Stage I (T2, N0, M0) signed by [name], MD on 12/28/2013  1:49 PM   Pathologic: Stage I (T2, N0, cM0) signed by [name], MD on 12/28/2013  1:49 PM   Summary: Stage I (T2, N0, cM0)     History of rectal cancer  11/17/2009 Procedure   Colonoscopy and rectal biopsy confirmed moderately differentiated adenocarcinoma   11/18/2009 Imaging   Transrectal ultrasound place staging T3 lesion   11/18/2009 Imaging    Staging CT scan of the chest, abdomen and pelvis showed multiple lesions in the liver as well as paraspinal mass of unknown etiology   12/05/2009 - 04/03/2010 Chemotherapy   The patient completed new adjuvant chemotherapy with 5-FU and radiation therapy   12/14/2009 Imaging   PET CT scan show faint metabolic activity in the paraspinal mass with no activity in the liver   04/24/2010 Surgery   She had surgical resection with negative margins. Final pathology was T2, N0, M0 (down staged by chemoradiation therapy from T3, N0, M0)   11/19/2012 Imaging   MRI of the liver confirmed benign hemangioma   01/08/2014 Procedure   Colonoscopy and biopsy was negative   12/21/2014 Imaging   CT scan showed no recurrence of colon cancer. Incidentally, paraspinal mass is slightly larger.   10/04/2016 Procedure   She had repeat colonoscopy which showed two 3 mm polyps in the transverse colon, removed with a cold snare. Resected and retrieved. The examination was otherwise normal on direct and retroflexion views.   10/04/2016 Pathology Results   Surgical [P], transverse, polyp(s) - SERRATED POLYP WITH FOCAL FEATURES SUGGESTIVE OF SESSILE SERRATED POLYP/ADENOMA, ONE FRAGMENT. - HYPERPLASTIC POLYP WITHOUT DYSPLASIA, ONE FRAGMENT. - BENIGN COLORECTAL MUCOSA WITHOUT DYSPLASIA, ONE FRAGMENT. - SEE COMMENT. Microscopic Comment After routine specimen processing, three fragments are identified. One of the fragments demonstrates a serrated horizontally situated crypt, which is suggestive of superficial sampling of a sessile serrated polyp/adenoma (the differential includes another fragment of hyperplastic polyp). Although this is the case, the finding is very focal and not definitive.  Malignant neoplasm of upper-outer quadrant of left breast in female, estrogen receptor positive ([place])  10/17/2022 Initial Diagnosis   Diffuse bilateral breast pains.  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%, HER2 3+ positive, Ki-67 40%, second mass biopsy revealed grade 2 ILC ER 95%, PR 70%, HER2 negative, Ki-67 15%      MEDICAL HISTORY:  Past Medical History:  Diagnosis Date   Anemia    Anxiety    Complication of anesthesia    difficulty waking up   Depression    Diabetes mellitus    type 2 DM   Heart murmur    History of rectal cancer 10/04/2011   Hypertension    Overactive bladder    Paraspinal mass 12/29/2014   Rectal cancer ([place]) dx'd 11/16/09   xrt comp 01/2010; chemo comp 01/2011   Tubular adenoma of colon [id number]    SURGICAL HISTORY: Past Surgical History:  Procedure Laterality Date   ABDOMINAL HYSTERECTOMY     for uterine fibroids   COLON SURGERY N/A 04/24/2010   "took out half rectum"; rectal cancer   EYE SURGERY     lasik surgery   HAND SURGERY     carpal tunnel- left   HEMORRHOID SURGERY N/A 1980   ORIF ANKLE FRACTURE Right 04/11/2020   Procedure: OPEN REDUCTION INTERNAL FIXATION (ORIF) ANKLE FRACTURE;  Surgeon: Carole Civil, MD;  Location: [facility];  Service: Orthopedics;  Laterality: Right;   PORT-A-CATH REMOVAL N/A 02/17/2019   Procedure: REMOVAL PORT-A-CATH;  Surgeon: Coralie Keens, MD;  Location: [facility];  Service: General;  Laterality: N/A;   PORTACATH PLACEMENT     2011   RECTAL SURGERY     2010   WISDOM TOOTH EXTRACTION      SOCIAL HISTORY: Social History   Socioeconomic History   Marital status: Single    Spouse name: Not on file   Number of children: Not on file   Years of education: Not on file   Highest education level: Not on file  Occupational History   Not on file  Tobacco Use   Smoking status: Former    Packs/day: 0.50    Years: 35.00    Total pack years: 17.50    Types: Cigarettes, Cigars    Quit date: 12/03/2009    Years since quitting: 12.9   Smokeless tobacco: Never   Vaping Use   Vaping Use: Never used  Substance and Sexual Activity   Alcohol use: Yes    Comment: socially   Drug use: No   Sexual activity: Not on file  Other Topics Concern   Not on file  Social History Narrative   Not on file   Social Determinants of Health   Financial Resource Strain: Not on file  Food Insecurity: Not on file  Transportation Needs: Not on file  Physical Activity: Not on file  Stress: Not on file  Social Connections: Not on file  Intimate Partner Violence: Not on file    FAMILY HISTORY: Family History  Problem Relation Age of Onset   Leukemia Father 53   Colon cancer Neg Hx    Esophageal cancer Neg Hx    Stomach cancer Neg Hx    Rectal cancer Neg Hx     ALLERGIES:  is allergic to advanced hand sanitizer [alcohol], norgesic [orphenadrine-aspirin-caffeine], tramadol, and antibacterial hand  soap [triclosan].  MEDICATIONS:  Current Outpatient Medications  Medication Sig Dispense Refill   amLODipine (NORVASC) 10 MG tablet Take 10 mg by mouth at bedtime.      aspirin EC 81 MG tablet Take 81 mg by mouth at bedtime.      atorvastatin (LIPITOR) 10 MG tablet Take 10 mg by mouth daily.     empagliflozin (JARDIANCE) 25 MG TABS tablet Take 25 mg by mouth daily with supper.      ibuprofen (ADVIL) 800 MG tablet Take 800 mg by mouth every 8 (eight) hours as needed.     latanoprost (XALATAN) 0.005 % ophthalmic solution Place 1 drop into both eyes at bedtime.      olmesartan (BENICAR) 40 MG tablet Take 40 mg by mouth daily.     Semaglutide, 1 MG/DOSE, 2 MG/1.5ML SOPN Inject 1 mg into the skin every 7 (seven) days.      spironolactone (ALDACTONE) 25 MG tablet Take 1 tablet by mouth 2 (two) times daily.     vortioxetine HBr (TRINTELLIX) 10 MG TABS tablet Take 10 mg by mouth daily with supper.  (Patient not taking: Reported on 04/13/2021)     No current facility-administered medications for this visit.    REVIEW OF SYSTEMS:   Constitutional: Denies fevers, chills  or abnormal night sweats   All other systems were reviewed with the patient and are negative.  PHYSICAL EXAMINATION: ECOG PERFORMANCE STATUS: 1 - Symptomatic but completely ambulatory  Vitals:   10/31/22 1250  BP: (!) 147/86  Pulse: 79  Resp: 16  Temp: 97.9 F (36.6 C)  SpO2: 96%   Filed Weights   10/31/22 1250  Weight: 199 lb (90.3 kg)    GENERAL:alert, no distress and comfortable    LABORATORY DATA:  I have reviewed the data as listed Lab Results  Component Value Date   WBC 5.1 10/31/2022   HGB 14.1 10/31/2022   HCT 44.2 10/31/2022   MCV 83.9 10/31/2022   PLT 338 10/31/2022   Lab Results  Component Value Date   NA 141 10/31/2022   K 4.4 10/31/2022   CL 107 10/31/2022   CO2 27 10/31/2022    RADIOGRAPHIC STUDIES: I have personally reviewed the radiological reports and agreed with the findings in the report.  ASSESSMENT AND PLAN:  Malignant neoplasm of upper-outer quadrant of left breast in female, estrogen receptor positive ([place]) 10/17/2022 Diffuse bilateral breast pains.  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 After reviewing the pathology in detail, we proceeded to discuss the different treatment options between surgery, radiation, chemotherapy, antiestrogen therapies.  Treatment plan: Breast MRI to determine extent of disease Breast conserving surgery with sentinel lymph node biopsy Adjuvant chemotherapy depending on the final tumor size of  the HER2 positive tumor.  If it is more than or equal to 0.5 cm then she will need chemotherapy. Adjuvant radiation therapy Adjuvant antiestrogen therapy  Shoulder pain: And upper back pain: I would like to obtain CT chest abdomen pelvis for further evaluation and  staging. Return to clinic after surgery to discuss final pathology report and to discuss her treatment plan.  All questions were answered. The patient knows to call the clinic with any problems, questions or concerns.    Tonya Ohara, MD 10/31/22

## 2022-10-31 NOTE — Assessment & Plan Note (Addendum)
10/17/2022 Diffuse bilateral breast pains.  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 After reviewing the pathology in detail, we proceeded to discuss the different treatment options between surgery, radiation, chemotherapy, antiestrogen therapies.  (2015 rectal cancer T3 N0 M0 treated with neoadjuvant chemotherapy and radiation downstage to T2 N0 M0: Treated by Dr. Alvy Bimler)  Treatment plan: Breast MRI to determine extent of disease Breast conserving surgery with sentinel lymph node biopsy Adjuvant chemotherapy depending on the final tumor size of the HER2 positive tumor.  If it is more than or equal to 0.5 cm then she will need chemotherapy. Adjuvant radiation therapy Adjuvant antiestrogen therapy  Return to clinic after surgery to discuss final pathology report and to discuss her treatment plan.

## 2022-10-31 NOTE — Research (Signed)
Trial:  [employer] 2021-05 - Specimen Collection Study to Evaluate Biomarkers in Subjects with Cancer   [name] was identified by Dr. Lindi Adie as a potential candidate for the above listed study.  This Clinical Research Coordinator met with Tonya Vincent, [id number], on 10/31/22 in a manner and location that ensures patient privacy to discuss participation in the above listed research study.  Patient is Accompanied by her sister .  A copy of the informed consent document with embedded HIPAA language was provided to the patient.  Patient reads, speaks, and understands [place].   Patient was provided with the business card of this Coordinator and encouraged to contact the research team with any questions.  Approximately 15 minutes were spent with the patient reviewing the informed consent documents.  Patient was provided the option of taking informed consent documents home to review and was encouraged to review at their convenience with their support network, including other care providers. Patient took the consent documents home to review.  Will confirm patient participation in the study on 11/05/2022 via phone, at [phone]. Johny Drilling, [facility] 10/31/2022 4:36 PM

## 2022-10-31 NOTE — Therapy (Signed)
OUTPATIENT PHYSICAL THERAPY BREAST CANCER BASELINE EVALUATION   Patient Name: Tonya Vincent MRN: [id number] DOB:1951-02-16, 71 y.o., female Today's Date: 10/31/2022  END OF SESSION:  PT End of Session - 10/31/22 1437     Visit Number 1    Number of Visits 2    Date for PT Re-Evaluation 12/26/22    PT Start Time 1353    PT Stop Time 1414   Also saw pt from 1428-1435 for a total of 28 min   PT Time Calculation (min) 21 min    Activity Tolerance Patient tolerated treatment well    Behavior During Therapy [facility] for tasks assessed/performed             Past Medical History:  Diagnosis Date   Anemia    Anxiety    Complication of anesthesia    difficulty waking up   Depression    Diabetes mellitus    type 2 DM   Heart murmur    History of rectal cancer 10/04/2011   Hypertension    Overactive bladder    Paraspinal mass 12/29/2014   Rectal cancer ([place]) dx'd 11/16/09   xrt comp 01/2010; chemo comp 01/2011   Tubular adenoma of colon [id number]   Past Surgical History:  Procedure Laterality Date   ABDOMINAL HYSTERECTOMY     for uterine fibroids   COLON SURGERY N/A 04/24/2010   "took out half rectum"; rectal cancer   EYE SURGERY     lasik surgery   HAND SURGERY     carpal tunnel- left   HEMORRHOID SURGERY N/A 1980   ORIF ANKLE FRACTURE Right 04/11/2020   Procedure: OPEN REDUCTION INTERNAL FIXATION (ORIF) ANKLE FRACTURE;  Surgeon: Carole Civil, MD;  Location: [facility];  Service: Orthopedics;  Laterality: Right;   PORT-A-CATH REMOVAL N/A 02/17/2019   Procedure: REMOVAL PORT-A-CATH;  Surgeon: Coralie Keens, MD;  Location: [place];  Service: General;  Laterality: N/A;   PORTACATH PLACEMENT     2011   RECTAL SURGERY     2010   WISDOM TOOTH EXTRACTION     Patient Active Problem List   Diagnosis Date Noted   Malignant neoplasm of lower-outer quadrant of left breast of female, estrogen receptor positive ([place]) 10/30/2022   Malignant neoplasm of upper-outer quadrant of  left breast in female, estrogen receptor positive ([place]) 10/23/2022   S/P ORIF (open reduction internal fixation) fracture right ankle 04/11/20 04/21/2020   Closed trimalleolar fracture of right ankle    History of colonic polyps 12/18/2019   Fecal smearing 12/18/2019   Left arm pain 02/04/2019   Rectal bleeding 02/06/2018   Microcytosis 01/01/2017   Essential hypertension 01/02/2016   Change in stool caliber 12/31/2014   Paraspinal mass 12/29/2014   Anemia in chronic illness 12/29/2014   History of rectal cancer 10/04/2011    REFERRING PROVIDER: Dr. Coralie Keens  REFERRING DIAG: Left breast cancer  THERAPY DIAG:  Malignant neoplasm of upper-outer quadrant of left breast in female, estrogen receptor positive ([place])  Abnormal posture  Acute pain of right shoulder  Rationale for Evaluation and Treatment: Rehabilitation  ONSET DATE: 10/12/2022  SUBJECTIVE:  SUBJECTIVE STATEMENT: Patient reports she is here today to be seen by her medical team for her newly diagnosed left breast cancer.   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 It measures 4 mm and 8 mm and is located in the upper outer quadrant. The smaller mass is triple positive with a Ki67 of 40%. The larger mass is ER/PR positive and HER2 negative with a Ki67 of 15%.  PATIENT GOALS:   reduce lymphedema risk and learn post op HEP.   PAIN:  Are you having pain? Yes: NPRS scale: 4/10 Pain location: right shoulder and upper back Pain description: nagging Aggravating factors: nothing Relieving factors: nothing  PRECAUTIONS: Active CA   HAND DOMINANCE: right  WEIGHT BEARING RESTRICTIONS: No  FALLS:  Has patient fallen in last 6 months? No  LIVING  ENVIRONMENT: Patient lives with: alone Lives in: House/apartment Has following equipment at home: None  OCCUPATION: Part time pharmacist  LEISURE: She does not exercise  PRIOR LEVEL OF FUNCTION: Independent   OBJECTIVE:  COGNITION: Overall cognitive status: Within functional limits for tasks assessed    POSTURE:  Forward head and rounded shoulders posture  UPPER EXTREMITY AROM/PROM:  A/PROM RIGHT   eval   Shoulder extension 41  Shoulder flexion 163  Shoulder abduction 169  Shoulder internal rotation 69  Shoulder external rotation 88    (Blank rows = not tested)  A/PROM LEFT   eval  Shoulder extension 39  Shoulder flexion 158  Shoulder abduction 172  Shoulder internal rotation 59  Shoulder external rotation 79    (Blank rows = not tested)  CERVICAL AROM: All within normal limits  UPPER EXTREMITY STRENGTH: WNL  LYMPHEDEMA ASSESSMENTS:   LANDMARK RIGHT   eval  10 cm proximal to olecranon process 33.5  Olecranon process 27.2  10 cm proximal to ulnar styloid process 25.9  Just proximal to ulnar styloid process 17.9  Across hand at thumb web space 21  At base of 2nd digit 7.2  (Blank rows = not tested)  LANDMARK LEFT   eval  10 cm proximal to olecranon process 34.2  Olecranon process 27  10 cm proximal to ulnar styloid process 24.7  Just proximal to ulnar styloid process 17.9  Across hand at thumb web space 20.9  At base of 2nd digit 7.3  (Blank rows = not tested)  L-DEX LYMPHEDEMA SCREENING:  The patient was assessed using the L-Dex machine today to produce a lymphedema index baseline score. The patient will be reassessed on a regular basis (typically every 3 months) to obtain new L-Dex scores. If the score is > 6.5 points away from his/her baseline score indicating onset of subclinical lymphedema, it will be recommended to wear a compression garment for 4 weeks, 12 hours per day and then be reassessed. If the score continues to be > 6.5 points from  baseline at reassessment, we will initiate lymphedema treatment. Assessing in this manner has a 95% rate of preventing clinically significant lymphedema.   L-DEX FLOWSHEETS - 10/31/22 1400       L-DEX LYMPHEDEMA SCREENING   Measurement Type Unilateral    L-DEX MEASUREMENT EXTREMITY Upper Extremity    POSITION  Standing    DOMINANT SIDE Right    At Risk Side Left    BASELINE SCORE (UNILATERAL) 2.5             QUICK DASH SURVEY:  Katina Dung - 10/31/22 0001     Open  a tight or new jar Mild difficulty    Do heavy household chores (wash walls, wash floors) Mild difficulty    Carry a shopping bag or briefcase Mild difficulty    Wash your back Mild difficulty    Use a knife to cut food No difficulty    Recreational activities in which you take some force or impact through your arm, shoulder, or hand (golf, hammering, tennis) Unable    During the past week, to what extent has your arm, shoulder or hand problem interfered with your normal social activities with family, friends, neighbors, or groups? Slightly    During the past week, to what extent has your arm, shoulder or hand problem limited your work or other regular daily activities Slightly    Arm, shoulder, or hand pain. Mild    Tingling (pins and needles) in your arm, shoulder, or hand Mild    Difficulty Sleeping Mild difficulty    DASH Score 29.55 %              PATIENT EDUCATION:  Education details: Lymphedema risk reduction and post op shoulder/posture HEP Person educated: Patient Education method: Explanation, Demonstration, Handout Education comprehension: Patient verbalized understanding and returned demonstration  HOME EXERCISE PROGRAM: Patient was instructed today in a home exercise program today for post op shoulder range of motion. These included active assist shoulder flexion in sitting, scapular retraction, wall walking with shoulder abduction, and hands behind head external rotation.  She was encouraged to  do these twice a day, holding 3 seconds and repeating 5 times when permitted by her physician.   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 It measures 4 mm and 8 mm and is located in the upper outer quadrant. The smaller mass is triple positive with a Ki67 of 40%. The larger mass is ER/PR positive and HER2 negative with a Ki67 of 15%.Her multidisciplinary medical team met prior to her assessments to determine a recommended treatment plan. She is planning to have a left lumpectomy and sentinel node biopsy followed by possible chemotherapy, radiation, and anti-estrogen therapy. She will benefit from a post op PT reassessment to determine needs and from L-Dex screens every 3 months for 2 years to detect subclinical lymphedema.  Pt will benefit from skilled therapeutic intervention to improve on the following deficits: Decreased knowledge of precautions, impaired UE functional use, pain, decreased ROM, postural dysfunction.   PT treatment/interventions: ADL/self-care home management, pt/family education, therapeutic exercise  REHAB POTENTIAL: Excellent  CLINICAL DECISION MAKING: Stable/uncomplicated  EVALUATION COMPLEXITY: Low   GOALS: Goals reviewed with patient? YES  LONG TERM GOALS: (STG=LTG)    Name Target Date Goal status  1 Pt will be able to verbalize understanding of pertinent lymphedema risk reduction practices relevant to her dx specifically related to skin care.  Baseline:  No knowledge 10/31/2022 Achieved at eval  2 Pt will be able to return demo and/or verbalize understanding of the post op HEP related to regaining shoulder ROM. Baseline:  No knowledge 10/31/2022 Achieved at eval  3 Pt will be able to verbalize understanding of the importance of attending the post op After Breast CA Class for further lymphedema risk reduction education and therapeutic exercise.  Baseline:  No knowledge  10/31/2022 Achieved at eval  4 Pt will demo she has regained full shoulder ROM and function post operatively compared to baselines.  Baseline: See objective measurements taken today. 12/26/2022     PLAN:  PT FREQUENCY/DURATION: EVAL and 1 follow up appointment.   PLAN FOR NEXT SESSION: will reassess 3-4 weeks post op to determine needs.   Patient will follow up at outpatient cancer rehab 3-4 weeks following surgery.  If the patient requires physical therapy at that time, a specific plan will be dictated and sent to the referring physician for approval. The patient was educated today on appropriate basic range of motion exercises to begin post operatively and the importance of attending the After Breast Cancer class following surgery.  Patient was educated today on lymphedema risk reduction practices as it pertains to recommendations that will benefit the patient immediately following surgery.  She verbalized good understanding.    Physical Therapy Information for After Breast Cancer Surgery/Treatment:  Lymphedema is a swelling condition that you may be at risk for in your arm if you have lymph nodes removed from the armpit area.  After a sentinel node biopsy, the risk is approximately 5-9% and is higher after an axillary node dissection.  There is treatment available for this condition and it is not life-threatening.  Contact your physician or physical therapist with concerns. You may begin the 4 shoulder/posture exercises (see additional sheet) when permitted by your physician (typically a week after surgery).  If you have drains, you may need to wait until those are removed before beginning range of motion exercises.  A general recommendation is to not lift your arms above shoulder height until drains are removed.  These exercises should be done to your tolerance and gently.  This is not a "no pain/no gain" type of recovery so listen to your body and stretch into the range of motion that you can  tolerate, stopping if you have pain.  If you are having immediate reconstruction, ask your plastic surgeon about doing exercises as he or she may want you to wait. We encourage you to attend the free one time ABC (After Breast Cancer) class offered by [facility].  You will learn information related to lymphedema risk, prevention and treatment and additional exercises to regain mobility following surgery.  You can call [phone] for more information.  This is offered the 1st and 3rd Monday of each month.  You only attend the class one time. While undergoing any medical procedure or treatment, try to avoid blood pressure being taken or needle sticks from occurring on the arm on the side of cancer.   This recommendation begins after surgery and continues for the rest of your life.  This may help reduce your risk of getting lymphedema (swelling in your arm). An excellent resource for those seeking information on lymphedema is the National Lymphedema Network's web site. It can be accessed at [place].org If you notice swelling in your hand, arm or breast at any time following surgery (even if it is many years from now), please contact your doctor or physical therapist to discuss this.  Lymphedema can be treated at any time but it is easier for you if it is treated early on.  If you feel like your shoulder motion is not returning to normal in a reasonable amount of time, please contact your surgeon or physical therapist.  [facility] [phone]. [address], [place] [address]  ABC CLASS After Breast Cancer Class  After Breast Cancer Class is a specially designed exercise class to assist you in a safe recover after having breast cancer surgery.  In this class you will learn how to  get back to full function whether your drains were just removed or if you had surgery a month ago.  This one-time class is held the 1st and 3rd Monday  of every month from 11:00 a.m. until 12:00 noon virtually.  This class is FREE and space is limited. For more information or to register for the next available class, call [phone].  Class Goals  Understand specific stretches to improve the flexibility of you chest and shoulder. Learn ways to safely strengthen your upper body and improve your posture. Understand the warning signs of infection and why you may be at risk for an arm infection. Learn about Lymphedema and prevention.  ** You do not attend this class until after surgery.  Drains must be removed to participate  Patient was instructed today in a home exercise program today for post op shoulder range of motion. These included active assist shoulder flexion in sitting, scapular retraction, wall walking with shoulder abduction, and hands behind head external rotation.  She was encouraged to do these twice a day, holding 3 seconds and repeating 5 times when permitted by her physician.   Annia Friendly, [place] 10/31/22 3:09 PM

## 2022-10-31 NOTE — Progress Notes (Signed)
[facility] Psychosocial Distress Screening Spiritual Care  Met with Chivonne and her sister in [facility] to introduce [facility] team/resources, reviewing distress screen per protocol.  The patient scored a 1 on the Psychosocial Distress Thermometer which indicates mild distress. Also assessed for distress and other psychosocial needs.      10/31/2022    3:49 PM  ONCBCN DISTRESS SCREENING  Distress experienced in past week (1-10) 1  Family Problem type Partner  Emotional problem type Depression;Nervousness/Anxiety;Adjusting to illness  Physical Problem type Pain;Sleep/insomnia;Skin dry/itchy  Referral to support programs Yes   Lysle Morales, counseling intern, met with the patient and her sister in clinic. The patient reported they are not feeling worried about the diagnosis. The patient reported they had rectal cancer over ten years ago and reacted with - "not again" when they were diagnosed with breast cancer.   The patient reported they are close with their sister and feel supported by her.    The patient reported they have depression - they are currently seeing a therapist. The patient has the counselor's number in case needs arise.   Follow up needed: No  Lysle Morales,  Counseling Intern  [phone] [email or web address]

## 2022-11-01 ENCOUNTER — Encounter: Payer: Self-pay | Admitting: Genetic Counselor

## 2022-11-01 ENCOUNTER — Encounter: Payer: Self-pay | Admitting: Hematology and Oncology

## 2022-11-01 NOTE — Progress Notes (Signed)
REFERRING PROVIDER: Nicholas Lose, MD [place],  [place] [id number]  PRIMARY PROVIDER:  [name], MD  PRIMARY REASON FOR VISIT:  1. Malignant neoplasm of upper-outer quadrant of left breast in female, estrogen receptor positive ([place])   2. History of rectal cancer   3. Family history of breast cancer     HISTORY OF PRESENT ILLNESS:   Tonya Vincent, a 71 y.o. female, was seen for a  cancer genetics consultation at the request of Dr. Lindi Adie due to a personal and family history of cancer.  Ms. Petruzzi presents to clinic today to discuss the possibility of a hereditary predisposition to cancer, to discuss genetic testing, and to further clarify her future cancer risks, as well as potential cancer risks for family members.   In 2023, at the age of 89, Ms. Bare was diagnosed with invasive lobular carcinoma of the left breast (2 masses; ER+/PR+/HER2- and ER+/PR+/HER2+). She also has a history of rectal cancer, diagnosed at age 38.  MMR or MSI results not available.      CANCER HISTORY:  Oncology History Overview Note  Malignant neoplasm of rectum, cT3N0M0 down staged to ypT2N0M0 after neoadjuvant chemotherapy and radiation therapy   Primary site: Colon and Rectum   Staging method: AJCC 7th Edition   Clinical: Stage I (T2, N0, M0) signed by [name], MD on 12/28/2013  1:49 PM   Pathologic: Stage I (T2, N0, cM0) signed by [name], MD on 12/28/2013  1:49 PM   Summary: Stage I (T2, N0, cM0)     History of rectal cancer  11/17/2009 Procedure   Colonoscopy and rectal biopsy confirmed moderately differentiated adenocarcinoma   11/18/2009 Imaging   Transrectal ultrasound place staging T3 lesion   11/18/2009 Imaging   Staging CT scan of the chest, abdomen and pelvis showed multiple lesions in the liver as well as paraspinal mass of unknown etiology   12/05/2009 - 04/03/2010 Chemotherapy   The patient completed new adjuvant chemotherapy with 5-FU and  radiation therapy   12/14/2009 Imaging   PET CT scan show faint metabolic activity in the paraspinal mass with no activity in the liver   04/24/2010 Surgery   She had surgical resection with negative margins. Final pathology was T2, N0, M0 (down staged by chemoradiation therapy from T3, N0, M0)   11/19/2012 Imaging   MRI of the liver confirmed benign hemangioma   01/08/2014 Procedure   Colonoscopy and biopsy was negative   12/21/2014 Imaging   CT scan showed no recurrence of colon cancer. Incidentally, paraspinal mass is slightly larger.   10/04/2016 Procedure   She had repeat colonoscopy which showed two 3 mm polyps in the transverse colon, removed with a cold snare. Resected and retrieved. The examination was otherwise normal on direct and retroflexion views.   10/04/2016 Pathology Results   Surgical [P], transverse, polyp(s) - SERRATED POLYP WITH FOCAL FEATURES SUGGESTIVE OF SESSILE SERRATED POLYP/ADENOMA, ONE FRAGMENT. - HYPERPLASTIC POLYP WITHOUT DYSPLASIA, ONE FRAGMENT. - BENIGN COLORECTAL MUCOSA WITHOUT DYSPLASIA, ONE FRAGMENT. - SEE COMMENT. Microscopic Comment After routine specimen processing, three fragments are identified. One of the fragments demonstrates a serrated horizontally situated crypt, which is suggestive of superficial sampling of a sessile serrated polyp/adenoma (the differential includes another fragment of hyperplastic polyp). Although this is the case, the finding is very focal and not definitive.    Malignant neoplasm of upper-outer quadrant of left breast in female, estrogen receptor positive ([place])  10/17/2022 Initial Diagnosis   Diffuse bilateral breast  pains.  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%, HER2 3+ positive, Ki-67 40%, second mass biopsy revealed grade 2 ILC ER 95%, PR 70%,  HER2 negative, Ki-67 15%   10/31/2022 Cancer Staging   Staging form: Breast, AJCC 8th Edition - Clinical: Stage IA (cT1a, cN0, cM0, G2, ER+, PR+, HER2+) - Signed by Nicholas Lose, MD on 10/31/2022 Stage prefix: Initial diagnosis Histologic grading system: 3 grade system       Past Medical History:  Diagnosis Date   Anemia    Anxiety    Complication of anesthesia    difficulty waking up   Depression    Diabetes mellitus    type 2 DM   Heart murmur    History of rectal cancer 10/04/2011   Hypertension    Overactive bladder    Paraspinal mass 12/29/2014   Rectal cancer ([place]) dx'd 11/16/09   xrt comp 01/2010; chemo comp 01/2011   Tubular adenoma of colon [id number]    Past Surgical History:  Procedure Laterality Date   ABDOMINAL HYSTERECTOMY     for uterine fibroids   COLON SURGERY N/A 04/24/2010   "took out half rectum"; rectal cancer   EYE SURGERY     lasik surgery   HAND SURGERY     carpal tunnel- left   HEMORRHOID SURGERY N/A 1980   ORIF ANKLE FRACTURE Right 04/11/2020   Procedure: OPEN REDUCTION INTERNAL FIXATION (ORIF) ANKLE FRACTURE;  Surgeon: Carole Civil, MD;  Location: [facility];  Service: Orthopedics;  Laterality: Right;   PORT-A-CATH REMOVAL N/A 02/17/2019   Procedure: REMOVAL PORT-A-CATH;  Surgeon: Coralie Keens, MD;  Location: [facility];  Service: General;  Laterality: N/A;   PORTACATH PLACEMENT     2011   RECTAL SURGERY     2010   WISDOM TOOTH EXTRACTION      FAMILY HISTORY:  We obtained a detailed, 4-generation family history.  Significant diagnoses are listed below: Family History  Problem Relation Age of Onset   Leukemia Father 17   Breast cancer Maternal Aunt        dx > 45; mother's maternal half sister   Kidney cancer Maternal Uncle        dx > 32   Breast cancer Paternal Aunt        dx > 63   Breast cancer Cousin        dx > 14; paternal female cousin   Mesothelioma Cousin        paternal female cousin    Ms. Stong is unaware of  previous family history of genetic testing for hereditary cancer risks. There is no reported Ashkenazi Jewish ancestry. There is no known consanguinity.  GENETIC COUNSELING ASSESSMENT: Ms. Feldkamp is a 71 y.o. female with a personal and family history which is somewhat suggestive of a hereditary cancer syndrome and predisposition to cancer given the presence of related cancers in the family. We, therefore, discussed and recommended the following at today's visit.   DISCUSSION: We discussed that 5 - 10% of cancer is hereditary.  Most cases of hereditary breast cancer are associated with mutations in BRCA1/2.  There are other genes that can be associated with hereditary breast or rectal cancer syndromes.  We discussed that testing is beneficial for several reasons including knowing how to follow individuals for their cancer risks and  understanding if other family members could be at risk for cancer and allowing them to undergo genetic testing.   We reviewed the characteristics, features and inheritance patterns of hereditary cancer syndromes. We also discussed genetic testing, including the appropriate family members to test, the process of testing, insurance coverage and turn-around-time for results. We discussed the implications of a negative, positive, carrier and/or variant of uncertain significant result. We recommended Ms. Iacovelli pursue genetic testing for a panel that includes genes associated with breast, rectal, and kidney cancer.   The Multi-Cancer + RNA Panel offered by [facility] includes sequencing and/or deletion/duplication analysis of the following 70 genes:  AIP*, ALK, APC*, ATM*, AXIN2*, BAP1*, BARD1*, BLM*, BMPR1A*, BRCA1*, BRCA2*, BRIP1*, CDC73*, CDH1*, CDK4, CDKN1B*, CDKN2A, CHEK2*, CTNNA1*, DICER1*, EPCAM, EGFR, FH*, FLCN*, GREM1, HOXB13, KIT, LZTR1, MAX*, MBD4, MEN1*, MET, MITF, MLH1*, MSH2*, MSH3*, MSH6*, MUTYH*, NF1*, NF2*, NTHL1*, PALB2*, PDGFRA, PMS2*, POLD1*, POLE*, POT1*, PRKAR1A*,  PTCH1*, PTEN*, RAD51C*, RAD51D*, RB1*, RET, SDHA*, SDHAF2*, SDHB*, SDHC*, SDHD*, SMAD4*, SMARCA4*, SMARCB1*, SMARCE1*, STK11*, SUFU*, TMEM127*, TP53*, TSC1*, TSC2*, VHL*. RNA analysis is performed for * genes.  Based on Ms. Terrill's personal and family history of breast cancer, she meets medical criteria for genetic testing. Despite that she meets criteria, she may still have an out of pocket cost. We discussed that if her out of pocket cost for testing is over $100, the laboratory should contact her and discuss the self-pay prices and/or patient pay assistance programs.     PLAN: After considering the risks, benefits, and limitations, Ms. Cuthbert provided informed consent to pursue genetic testing and the blood sample was sent to [facility] for analysis of the Multi-Cancer +RNA Panel. Results should be available within approximately 3 weeks' time, at which point they will be disclosed by telephone to Ms. Selway, as will any additional recommendations warranted by these results. Ms. Narciso will receive a summary of her genetic counseling visit and a copy of her results once available. This information will also be available in Epic.   Ms. Winiarski questions were answered to her satisfaction today. Our contact information was provided should additional questions or concerns arise. Thank you for the referral and allowing [place] to share in the care of your patient.   Alisea Matte M. Joette Catching, [place], [facility] Genetic Counselor [email or web address] (P) [phone]  The patient was seen for a total of 15 minutes in face-to-face genetic counseling.  Patient was accompanied by her sister, Evlyn Clines.  Drs. Lindi Adie and/or Burr Medico were available to discuss this case as needed.    _______________________________________________________________________ For Office Staff:  Number of people involved in session: 2 Was an Intern/ student involved with case: no

## 2022-11-02 ENCOUNTER — Encounter: Payer: Self-pay | Admitting: *Deleted

## 2022-11-05 ENCOUNTER — Telehealth: Payer: Self-pay

## 2022-11-05 DIAGNOSIS — Z170 Estrogen receptor positive status [ER+]: Secondary | ICD-10-CM

## 2022-11-05 DIAGNOSIS — C50412 Malignant neoplasm of upper-outer quadrant of left female breast: Secondary | ICD-10-CM

## 2022-11-05 NOTE — Telephone Encounter (Signed)
[employer] 2021-05 - Specimen Collection Study to Evaluate Biomarkers in Subjects with Cancer    Called patient to discuss the study. Patient is still interested in participating, but wants to work around their and will follow up on Friday about when its best from for them to begin the study procedures.   Johny Drilling, [facility] 11/05/2022 11:19 AM [phone]

## 2022-11-07 ENCOUNTER — Encounter: Payer: Self-pay | Admitting: *Deleted

## 2022-11-07 ENCOUNTER — Ambulatory Visit (HOSPITAL_COMMUNITY)
Admission: RE | Admit: 2022-11-07 | Discharge: 2022-11-07 | Disposition: A | Discharge status: Home / Self Care | Payer: Medicare HMO | Source: Ambulatory Visit | Attending: Hematology and Oncology | Admitting: Hematology and Oncology

## 2022-11-07 DIAGNOSIS — C50412 Malignant neoplasm of upper-outer quadrant of left female breast: Secondary | ICD-10-CM | POA: Diagnosis not present

## 2022-11-07 DIAGNOSIS — C50919 Malignant neoplasm of unspecified site of unspecified female breast: Secondary | ICD-10-CM | POA: Diagnosis not present

## 2022-11-07 DIAGNOSIS — C50011 Malignant neoplasm of nipple and areola, right female breast: Secondary | ICD-10-CM | POA: Diagnosis not present

## 2022-11-07 DIAGNOSIS — K8020 Calculus of gallbladder without cholecystitis without obstruction: Secondary | ICD-10-CM | POA: Diagnosis not present

## 2022-11-07 DIAGNOSIS — Z170 Estrogen receptor positive status [ER+]: Secondary | ICD-10-CM | POA: Diagnosis not present

## 2022-11-07 DIAGNOSIS — E1169 Type 2 diabetes mellitus with other specified complication: Secondary | ICD-10-CM | POA: Diagnosis not present

## 2022-11-07 DIAGNOSIS — Z6835 Body mass index (BMI) 35.0-35.9, adult: Secondary | ICD-10-CM | POA: Diagnosis not present

## 2022-11-07 MED ORDER — IOHEXOL 300 MG/ML  SOLN
100.0000 mL | Freq: Once | INTRAMUSCULAR | Status: AC | PRN
  Administered 2022-11-07: 100 mL via INTRAVENOUS

## 2022-11-07 MED ORDER — SODIUM CHLORIDE (PF) 0.9 % IJ SOLN
INTRAMUSCULAR | Status: AC
  Filled 2022-11-07: qty 50

## 2022-11-07 NOTE — Progress Notes (Signed)
Pt currently undergoing initial treatments for newly diagnosed left breast cancer with multiple specialists, so labs and b/p being monitored frequently. Pt last saw PCP, [name], for visit 08/02/22. Letter of results mailed to pt. No further heatlh equity team support indicated at this time.

## 2022-11-08 ENCOUNTER — Encounter: Payer: Self-pay | Admitting: *Deleted

## 2022-11-08 ENCOUNTER — Telehealth: Payer: Self-pay | Admitting: *Deleted

## 2022-11-08 NOTE — Telephone Encounter (Signed)
Left VM regarding bmdc from 10/31/22. Contact information provided for questions or needs. Next steps provided in detail.

## 2022-11-09 ENCOUNTER — Other Ambulatory Visit: Payer: Self-pay

## 2022-11-09 ENCOUNTER — Encounter (HOSPITAL_BASED_OUTPATIENT_CLINIC_OR_DEPARTMENT_OTHER)
Admission: RE | Admit: 2022-11-09 | Discharge: 2022-11-09 | Disposition: A | Discharge status: Home / Self Care | Payer: Medicare HMO | Source: Ambulatory Visit | Attending: Surgery | Admitting: Surgery

## 2022-11-09 ENCOUNTER — Encounter (HOSPITAL_BASED_OUTPATIENT_CLINIC_OR_DEPARTMENT_OTHER): Payer: Self-pay | Admitting: Surgery

## 2022-11-09 ENCOUNTER — Ambulatory Visit
Admission: RE | Admit: 2022-11-09 | Discharge: 2022-11-09 | Disposition: A | Discharge status: Home / Self Care | Payer: Medicare HMO | Source: Ambulatory Visit | Attending: Hematology and Oncology | Admitting: Hematology and Oncology

## 2022-11-09 DIAGNOSIS — Z01810 Encounter for preprocedural cardiovascular examination: Secondary | ICD-10-CM | POA: Insufficient documentation

## 2022-11-09 DIAGNOSIS — I10 Essential (primary) hypertension: Secondary | ICD-10-CM | POA: Insufficient documentation

## 2022-11-09 DIAGNOSIS — Z170 Estrogen receptor positive status [ER+]: Secondary | ICD-10-CM

## 2022-11-09 DIAGNOSIS — C50412 Malignant neoplasm of upper-outer quadrant of left female breast: Secondary | ICD-10-CM | POA: Diagnosis not present

## 2022-11-09 MED ORDER — ENSURE PRE-SURGERY PO LIQD: 296.0000 mL | Freq: Once | ORAL | Status: DC

## 2022-11-09 MED ORDER — GADOPICLENOL 0.5 MMOL/ML IV SOLN
8.0000 mL | Freq: Once | INTRAVENOUS | Status: AC | PRN
  Administered 2022-11-09: 8 mL via INTRAVENOUS

## 2022-11-09 NOTE — Progress Notes (Signed)

## 2022-11-09 NOTE — Progress Notes (Signed)
   11/09/22 1123  PAT Phone Screen  Is the patient taking a GLP-1 receptor agonist? (S)  Yes (Semaglutide-LD 11-04-22)  Has the patient been informed on holding medication? Yes  Do You Have Diabetes? Yes  Do You Have Hypertension? Yes  Have You Ever Been to the ER for Asthma? No  Have You Taken Oral Steroids in the Past 3 Months? No  Do you Take Phenteramine or any Other Diet Drugs? No  Recent  Lab Work, EKG, CXR? (S)  Yes  Where was this test performed? 10-31-22 CBC/diff, CMP  Do you have a history of heart problems? (S)  No (07-27-21 ECHO EF 65-70%)  Any Recent Hospitalizations? No  Height '5\' 4"'$  (1.626 m)  Weight 90.3 kg  Pat Appointment Scheduled (S)  Yes (coming in for EKG)

## 2022-11-12 ENCOUNTER — Ambulatory Visit: Payer: Self-pay | Admitting: Genetic Counselor

## 2022-11-12 ENCOUNTER — Telehealth: Payer: Self-pay | Admitting: Genetic Counselor

## 2022-11-12 ENCOUNTER — Encounter: Payer: Self-pay | Admitting: Genetic Counselor

## 2022-11-12 DIAGNOSIS — Z1379 Encounter for other screening for genetic and chromosomal anomalies: Secondary | ICD-10-CM

## 2022-11-12 DIAGNOSIS — Z803 Family history of malignant neoplasm of breast: Secondary | ICD-10-CM

## 2022-11-12 DIAGNOSIS — C50412 Malignant neoplasm of upper-outer quadrant of left female breast: Secondary | ICD-10-CM | POA: Diagnosis not present

## 2022-11-12 DIAGNOSIS — Z170 Estrogen receptor positive status [ER+]: Secondary | ICD-10-CM

## 2022-11-12 DIAGNOSIS — Z85048 Personal history of other malignant neoplasm of rectum, rectosigmoid junction, and anus: Secondary | ICD-10-CM

## 2022-11-12 DIAGNOSIS — C50812 Malignant neoplasm of overlapping sites of left female breast: Secondary | ICD-10-CM | POA: Diagnosis not present

## 2022-11-12 NOTE — Telephone Encounter (Signed)
Revealed negative genetics and VUS in KIT.

## 2022-11-13 ENCOUNTER — Telehealth: Payer: Self-pay | Admitting: *Deleted

## 2022-11-13 ENCOUNTER — Encounter: Payer: Self-pay | Admitting: *Deleted

## 2022-11-13 NOTE — Telephone Encounter (Signed)
Called pt to discuss MRI results. Left vm with results as well as next steps of sx on 12/13 and f/u appt date and time with Dr. Lindi Adie. Encourage pt to call with questions or needs. Received verbal understanding.

## 2022-11-13 NOTE — H&P (Addendum)
REFERRING PHYSICIAN: Lucia Bitter*  PROVIDER: Wayne Both, MD  MRN: [id number] DOB: 08-19-1951 DATE OF ENCOUNTER: 10/31/2022 Subjective  Chief Complaint: Breast Cancer   History of Present Illness: Tonya Vincent is a 71 y.o. female who is seen today as an office consultation for evaluation of Breast Cancer .  This is a 71 year old female who had undergone bilateral mammograms for bilateral breast pain. She also underwent an ultrasound. She was found to have 2 masses in the upper outer quadrant of the left breast. 1 mass at 3:00 measured 4 mm. Another mass at 2:00 measured 8 mm. Ultrasound the axilla was negative. Biopsy of both masses showed a invasive lobular carcinoma and lobular carcinoma in situ. The smaller mass was 95% ER positive, 5% PR positive, HER2 positive, with a Ki-67 of 40%. The 8 mm mass was 95% ER positive, 70% PR positive, HER2 negative, and had a Ki-67 of 15%. There was questionable 3 cm worth of distortion in the biopsy clips were 2.5 cm apart. Has had no previous problems regarding her breast. She denies nipple discharge. There is a family history of breast cancer in her aunts.  She has had colon cancer requiring a partial colectomy and chemotherapy in 2011. She has no cardiopulmonary issues  Review of Systems: A complete review of systems was obtained from the patient. I have reviewed this information and discussed as appropriate with the patient. See HPI as well for other ROS.  ROS  Medical History: Past Medical History: Diagnosis Date Anemia Anxiety Arthritis Diabetes mellitus without complication (CMS-HCC) DVT (deep venous thrombosis) (CMS-HCC) GERD (gastroesophageal reflux disease) History of cancer Hyperlipidemia Hypertension  Patient Active Problem List Diagnosis Attention deficit hyperactivity disorder Essential hypertension History of colonic polyps History of rectal cancer Malignant neoplasm of lower-outer quadrant  of left breast of female, estrogen receptor positive Chills Nasal congestion Visual disturbance SOB (shortness of breath) Nausea Diarrhea Neck pain Dizziness Headache Memory loss Easy bruising Bleeds easily (CMS-HCC)  Past Surgical History: Procedure Laterality Date ORIF ANKLE FRACTURE Right 04/11/2020   Allergies Allergen Reactions Alcohol Dermatitis Orphenadrine-Asa-Caffeine Hives Tramadol Itching Triclosan Dermatitis  Current Outpatient Medications on File Prior to Visit Medication Sig Dispense Refill amLODIPine (NORVASC) 10 MG tablet Take 10 mg by mouth once daily aspirin 81 MG EC tablet Take 81 mg by mouth at bedtime atorvastatin (LIPITOR) 10 MG tablet Take 10 mg by mouth once daily empagliflozin (JARDIANCE) 25 mg tablet Take 25 mg by mouth daily with dinner latanoprost (XALATAN) 0.005 % ophthalmic solution Place 1 drop into both eyes at bedtime olmesartan (BENICAR) 40 MG tablet Take 40 mg by mouth once daily semaglutide (OZEMPIC) 1 mg/dose (2 mg/1.5 mL) pen injector Inject 1 mg subcutaneously once a week spironolactone (ALDACTONE) 25 MG tablet Take 25 mg by mouth 2 (two) times daily vortioxetine (TRINTELLIX) 10 mg tablet Take 10 mg by mouth once daily  No current facility-administered medications on file prior to visit.  Family History Problem Relation Age of Onset Diabetes Mother Coronary Artery Disease (Blocked arteries around heart) Mother High blood pressure (Hypertension) Mother Stroke Mother Coronary Artery Disease (Blocked arteries around heart) Father High blood pressure (Hypertension) Father Hyperlipidemia (Elevated cholesterol) Father   Social History  Tobacco Use Smoking Status Former Types: Cigarettes Quit date: 2010 Years since quitting: 13.9 Smokeless Tobacco Never   Social History  Socioeconomic History Marital status: Single Tobacco Use Smoking status: Former Types: Cigarettes Quit date: 2010 Years since quitting:  13.9 Smokeless tobacco: Never Vaping Use Vaping Use:  Never used Substance and Sexual Activity Alcohol use: Yes Comment: 6 beers a week Drug use: Never Sexual activity: Defer  Objective: There were no vitals filed for this visit. There is no height or weight on file to calculate BMI.  Physical Exam  She appears well on exam.  There are no palpable breast masses in her breast. The nipple areolar complexes are normal.  There is no axillary adenopathy.  She has well-healed scars from her Port-A-Cath removal on the left chest  Labs, Imaging and Diagnostic Testing: I have reviewed her mammograms, ultrasound, and pathology results  Assessment and Plan:  Diagnoses and all orders for this visit:  Invasive lobular carcinoma of left breast in female (CMS-HCC)    I had a discussion with the patient and her family regarding breast cancer. I discussed our multidisciplinary approach in the surgical care of breast cancer. We discussed surgical options which includes breast conservation with lumpectomy versus mastectomy. She would like to proceed with breast conservation. We discussed proceeding with a radioactive seed guided left breast lumpectomy and sentinel node biopsy. I explained the surgical procedure in detail as well as the need to remove lymph nodes to evaluate for spread of the malignancy. We discussed the risk of surgery which include but is not limited to bleeding, infection, injury to surrounding structures, seroma formation, chronic arm swelling, the need for further procedures if margins or nodes are positive, cardiopulmonary issues, DVT, etc. Given the lobular features and distortions on her films, we do recommend a preoperative MRI. I discussed this with her and her family as well and whether this may lead to further biopsies or change in plans from a surgical approach. After discussion conference, we will also repeat the profiles on the final pathology to see whether or not she  is truly HER2 positive so we will currently hold on any Port-A-Cath insertion. They understand and agree with the plans.  Addendum:  MRI showed no other breast abnormalities in either breast

## 2022-11-14 ENCOUNTER — Other Ambulatory Visit: Payer: Self-pay

## 2022-11-14 ENCOUNTER — Encounter (HOSPITAL_BASED_OUTPATIENT_CLINIC_OR_DEPARTMENT_OTHER): Payer: Self-pay | Admitting: Surgery

## 2022-11-14 ENCOUNTER — Ambulatory Visit (HOSPITAL_BASED_OUTPATIENT_CLINIC_OR_DEPARTMENT_OTHER): Payer: Medicare HMO | Admitting: Anesthesiology

## 2022-11-14 ENCOUNTER — Encounter: Payer: Self-pay | Admitting: Hematology and Oncology

## 2022-11-14 ENCOUNTER — Encounter (HOSPITAL_BASED_OUTPATIENT_CLINIC_OR_DEPARTMENT_OTHER)
Admission: RE | Disposition: A | Discharge status: Home / Self Care | Payer: Self-pay | Source: Home / Self Care | Attending: Surgery

## 2022-11-14 ENCOUNTER — Ambulatory Visit (HOSPITAL_BASED_OUTPATIENT_CLINIC_OR_DEPARTMENT_OTHER)
Admission: RE | Admit: 2022-11-14 | Discharge: 2022-11-14 | Disposition: A | Discharge status: Home / Self Care | Payer: Medicare HMO | Attending: Surgery | Admitting: Surgery

## 2022-11-14 DIAGNOSIS — E119 Type 2 diabetes mellitus without complications: Secondary | ICD-10-CM | POA: Insufficient documentation

## 2022-11-14 DIAGNOSIS — C50512 Malignant neoplasm of lower-outer quadrant of left female breast: Secondary | ICD-10-CM | POA: Insufficient documentation

## 2022-11-14 DIAGNOSIS — Z6834 Body mass index (BMI) 34.0-34.9, adult: Secondary | ICD-10-CM | POA: Insufficient documentation

## 2022-11-14 DIAGNOSIS — Z170 Estrogen receptor positive status [ER+]: Secondary | ICD-10-CM | POA: Diagnosis not present

## 2022-11-14 DIAGNOSIS — Z9221 Personal history of antineoplastic chemotherapy: Secondary | ICD-10-CM | POA: Insufficient documentation

## 2022-11-14 DIAGNOSIS — D630 Anemia in neoplastic disease: Secondary | ICD-10-CM | POA: Diagnosis not present

## 2022-11-14 DIAGNOSIS — E669 Obesity, unspecified: Secondary | ICD-10-CM | POA: Insufficient documentation

## 2022-11-14 DIAGNOSIS — Z87891 Personal history of nicotine dependence: Secondary | ICD-10-CM | POA: Diagnosis not present

## 2022-11-14 DIAGNOSIS — Z803 Family history of malignant neoplasm of breast: Secondary | ICD-10-CM | POA: Diagnosis not present

## 2022-11-14 DIAGNOSIS — G8918 Other acute postprocedural pain: Secondary | ICD-10-CM | POA: Diagnosis not present

## 2022-11-14 DIAGNOSIS — C773 Secondary and unspecified malignant neoplasm of axilla and upper limb lymph nodes: Secondary | ICD-10-CM | POA: Diagnosis not present

## 2022-11-14 DIAGNOSIS — I10 Essential (primary) hypertension: Secondary | ICD-10-CM | POA: Insufficient documentation

## 2022-11-14 DIAGNOSIS — Z85048 Personal history of other malignant neoplasm of rectum, rectosigmoid junction, and anus: Secondary | ICD-10-CM | POA: Diagnosis not present

## 2022-11-14 DIAGNOSIS — C50912 Malignant neoplasm of unspecified site of left female breast: Secondary | ICD-10-CM

## 2022-11-14 HISTORY — PX: SENTINEL NODE BIOPSY: SHX6608

## 2022-11-14 HISTORY — DX: Unspecified osteoarthritis, unspecified site: M19.90

## 2022-11-14 HISTORY — PX: BREAST LUMPECTOMY WITH RADIOACTIVE SEED LOCALIZATION: SHX6424

## 2022-11-14 HISTORY — DX: Dyspnea, unspecified: R06.00

## 2022-11-14 LAB — GLUCOSE, CAPILLARY
Glucose-Capillary: 114 mg/dL — ABNORMAL HIGH (ref 70–99)
Glucose-Capillary: 136 mg/dL — ABNORMAL HIGH (ref 70–99)

## 2022-11-14 SURGERY — BREAST LUMPECTOMY WITH RADIOACTIVE SEED LOCALIZATION
Anesthesia: General | Site: Breast | Laterality: Left

## 2022-11-14 MED ORDER — ONDANSETRON HCL 4 MG/2ML IJ SOLN
INTRAMUSCULAR | Status: AC
  Filled 2022-11-14: qty 2

## 2022-11-14 MED ORDER — FENTANYL CITRATE (PF) 100 MCG/2ML IJ SOLN
INTRAMUSCULAR | Status: AC
  Filled 2022-11-14: qty 2

## 2022-11-14 MED ORDER — OXYCODONE HCL 5 MG/5ML PO SOLN: 5.0000 mg | Freq: Once | ORAL | Status: DC | PRN

## 2022-11-14 MED ORDER — PHENYLEPHRINE 80 MCG/ML (10ML) SYRINGE FOR IV PUSH (FOR BLOOD PRESSURE SUPPORT)
PREFILLED_SYRINGE | INTRAVENOUS | Status: AC
  Filled 2022-11-14: qty 10

## 2022-11-14 MED ORDER — PHENYLEPHRINE HCL (PRESSORS) 10 MG/ML IV SOLN
INTRAVENOUS | Status: DC | PRN
  Administered 2022-11-14 (×2): 80 ug via INTRAVENOUS
  Administered 2022-11-14: 160 ug via INTRAVENOUS
  Administered 2022-11-14 (×2): 80 ug via INTRAVENOUS
  Administered 2022-11-14: 160 ug via INTRAVENOUS
  Administered 2022-11-14: 80 ug via INTRAVENOUS

## 2022-11-14 MED ORDER — LIDOCAINE 2% (20 MG/ML) 5 ML SYRINGE
INTRAMUSCULAR | Status: AC
  Filled 2022-11-14: qty 5

## 2022-11-14 MED ORDER — OXYCODONE HCL 5 MG PO TABS: 5.0000 mg | ORAL_TABLET | Freq: Four times a day (QID) | ORAL | 0 refills | Status: DC | PRN

## 2022-11-14 MED ORDER — MIDAZOLAM HCL 2 MG/2ML IJ SOLN: 2.0000 mg | Freq: Once | INTRAMUSCULAR | Status: DC

## 2022-11-14 MED ORDER — ONDANSETRON HCL 4 MG/2ML IJ SOLN
INTRAMUSCULAR | Status: DC | PRN
  Administered 2022-11-14: 4 mg via INTRAVENOUS

## 2022-11-14 MED ORDER — ACETAMINOPHEN 500 MG PO TABS
1000.0000 mg | ORAL_TABLET | ORAL | Status: AC
  Administered 2022-11-14: 1000 mg via ORAL

## 2022-11-14 MED ORDER — LIDOCAINE 2% (20 MG/ML) 5 ML SYRINGE
INTRAMUSCULAR | Status: DC | PRN
  Administered 2022-11-14: 80 mg via INTRAVENOUS

## 2022-11-14 MED ORDER — FENTANYL CITRATE (PF) 100 MCG/2ML IJ SOLN
100.0000 ug | Freq: Once | INTRAMUSCULAR | Status: AC
  Administered 2022-11-14: 100 ug via INTRAVENOUS

## 2022-11-14 MED ORDER — PROPOFOL 10 MG/ML IV BOLUS
INTRAVENOUS | Status: DC | PRN
  Administered 2022-11-14: 20 mg via INTRAVENOUS
  Administered 2022-11-14: 150 mg via INTRAVENOUS

## 2022-11-14 MED ORDER — DEXAMETHASONE SODIUM PHOSPHATE 10 MG/ML IJ SOLN
INTRAMUSCULAR | Status: AC
  Filled 2022-11-14: qty 1

## 2022-11-14 MED ORDER — 0.9 % SODIUM CHLORIDE (POUR BTL) OPTIME
TOPICAL | Status: DC | PRN
  Administered 2022-11-14: 1000 mL

## 2022-11-14 MED ORDER — GLYCOPYRROLATE 0.2 MG/ML IJ SOLN
INTRAMUSCULAR | Status: DC | PRN
  Administered 2022-11-14: .2 mg via INTRAVENOUS

## 2022-11-14 MED ORDER — CLONIDINE HCL (ANALGESIA) 100 MCG/ML EP SOLN
EPIDURAL | Status: DC | PRN
  Administered 2022-11-14: 80 ug

## 2022-11-14 MED ORDER — ACETAMINOPHEN 500 MG PO TABS
ORAL_TABLET | ORAL | Status: AC
  Filled 2022-11-14: qty 2

## 2022-11-14 MED ORDER — MAGTRACE LYMPHATIC TRACER
INTRAMUSCULAR | Status: DC | PRN
  Administered 2022-11-14: 2 mL via INTRAMUSCULAR

## 2022-11-14 MED ORDER — DEXAMETHASONE SODIUM PHOSPHATE 4 MG/ML IJ SOLN
INTRAMUSCULAR | Status: DC | PRN
  Administered 2022-11-14: 10 mg via INTRAVENOUS

## 2022-11-14 MED ORDER — AMISULPRIDE (ANTIEMETIC) 5 MG/2ML IV SOLN: 10.0000 mg | Freq: Once | INTRAVENOUS | Status: DC | PRN

## 2022-11-14 MED ORDER — LACTATED RINGERS IV SOLN: INTRAVENOUS | Status: DC

## 2022-11-14 MED ORDER — BUPIVACAINE-EPINEPHRINE 0.5% -1:200000 IJ SOLN
INTRAMUSCULAR | Status: DC | PRN
  Administered 2022-11-14: 20 mL

## 2022-11-14 MED ORDER — PROMETHAZINE HCL 25 MG/ML IJ SOLN: 6.2500 mg | INTRAMUSCULAR | Status: DC | PRN

## 2022-11-14 MED ORDER — CEFAZOLIN SODIUM-DEXTROSE 2-4 GM/100ML-% IV SOLN
INTRAVENOUS | Status: AC
  Filled 2022-11-14: qty 100

## 2022-11-14 MED ORDER — MIDAZOLAM HCL 2 MG/2ML IJ SOLN
INTRAMUSCULAR | Status: AC
  Filled 2022-11-14: qty 2

## 2022-11-14 MED ORDER — DEXAMETHASONE SODIUM PHOSPHATE 4 MG/ML IJ SOLN
INTRAMUSCULAR | Status: DC | PRN
  Administered 2022-11-14: 5 mg via PERINEURAL

## 2022-11-14 MED ORDER — CEFAZOLIN SODIUM-DEXTROSE 2-4 GM/100ML-% IV SOLN
2.0000 g | INTRAVENOUS | Status: AC
  Administered 2022-11-14: 2 g via INTRAVENOUS

## 2022-11-14 MED ORDER — CHLORHEXIDINE GLUCONATE CLOTH 2 % EX PADS: 6.0000 | MEDICATED_PAD | Freq: Once | CUTANEOUS | Status: DC

## 2022-11-14 MED ORDER — OXYCODONE HCL 5 MG PO TABS: 5.0000 mg | ORAL_TABLET | Freq: Once | ORAL | Status: DC | PRN

## 2022-11-14 MED ORDER — GLYCOPYRROLATE PF 0.2 MG/ML IJ SOSY
PREFILLED_SYRINGE | INTRAMUSCULAR | Status: AC
  Filled 2022-11-14: qty 1

## 2022-11-14 MED ORDER — EPHEDRINE 5 MG/ML INJ
INTRAVENOUS | Status: AC
  Filled 2022-11-14: qty 5

## 2022-11-14 MED ORDER — FENTANYL CITRATE (PF) 100 MCG/2ML IJ SOLN: 25.0000 ug | INTRAMUSCULAR | Status: DC | PRN

## 2022-11-14 MED ORDER — PROPOFOL 10 MG/ML IV BOLUS
INTRAVENOUS | Status: AC
  Filled 2022-11-14: qty 20

## 2022-11-14 MED ORDER — ROPIVACAINE HCL 5 MG/ML IJ SOLN
INTRAMUSCULAR | Status: DC | PRN
  Administered 2022-11-14: 30 mL via PERINEURAL

## 2022-11-14 MED ORDER — FENTANYL CITRATE (PF) 100 MCG/2ML IJ SOLN
INTRAMUSCULAR | Status: DC | PRN
  Administered 2022-11-14 (×2): 25 ug via INTRAVENOUS
  Administered 2022-11-14: 50 ug via INTRAVENOUS

## 2022-11-14 SURGICAL SUPPLY — 48 items
ADH SKN CLS APL DERMABOND .7 (GAUZE/BANDAGES/DRESSINGS) ×2
APL PRP STRL LF DISP 70% ISPRP (MISCELLANEOUS) ×2
APPLIER CLIP 9.375 MED OPEN (MISCELLANEOUS) ×2
APR CLP MED 9.3 20 MLT OPN (MISCELLANEOUS) ×2
BINDER BREAST 3XL (GAUZE/BANDAGES/DRESSINGS) IMPLANT
BINDER BREAST LRG (GAUZE/BANDAGES/DRESSINGS) IMPLANT
BINDER BREAST MEDIUM (GAUZE/BANDAGES/DRESSINGS) IMPLANT
BINDER BREAST XLRG (GAUZE/BANDAGES/DRESSINGS) IMPLANT
BINDER BREAST XXLRG (GAUZE/BANDAGES/DRESSINGS) IMPLANT
BLADE SURG 15 STRL LF DISP TIS (BLADE) ×2 IMPLANT
BLADE SURG 15 STRL SS (BLADE) ×2
CANISTER SUC SOCK COL 7IN (MISCELLANEOUS) IMPLANT
CANISTER SUCT 1200ML W/VALVE (MISCELLANEOUS) IMPLANT
CHLORAPREP W/TINT 26 (MISCELLANEOUS) ×2 IMPLANT
CLIP APPLIE 9.375 MED OPEN (MISCELLANEOUS) IMPLANT
COVER BACK TABLE 60X90IN (DRAPES) ×2 IMPLANT
COVER MAYO STAND STRL (DRAPES) ×2 IMPLANT
COVER PROBE CYLINDRICAL 5X96 (MISCELLANEOUS) ×2 IMPLANT
DERMABOND ADVANCED .7 DNX12 (GAUZE/BANDAGES/DRESSINGS) ×2 IMPLANT
DRAPE LAPAROSCOPIC ABDOMINAL (DRAPES) ×2 IMPLANT
DRAPE UTILITY XL STRL (DRAPES) ×2 IMPLANT
ELECT REM PT RETURN 9FT ADLT (ELECTROSURGICAL) ×2
ELECTRODE REM PT RTRN 9FT ADLT (ELECTROSURGICAL) ×2 IMPLANT
GAUZE SPONGE 4X4 12PLY STRL LF (GAUZE/BANDAGES/DRESSINGS) IMPLANT
GLOVE SURG SIGNA 7.5 PF LTX (GLOVE) ×2 IMPLANT
GOWN STRL REUS W/ TWL LRG LVL3 (GOWN DISPOSABLE) ×2 IMPLANT
GOWN STRL REUS W/ TWL XL LVL3 (GOWN DISPOSABLE) ×2 IMPLANT
GOWN STRL REUS W/TWL LRG LVL3 (GOWN DISPOSABLE) ×2
GOWN STRL REUS W/TWL XL LVL3 (GOWN DISPOSABLE) ×2
KIT MARKER MARGIN INK (KITS) ×2 IMPLANT
NDL HYPO 25X1 1.5 SAFETY (NEEDLE) ×2 IMPLANT
NEEDLE HYPO 25X1 1.5 SAFETY (NEEDLE) ×2 IMPLANT
NS IRRIG 1000ML POUR BTL (IV SOLUTION) IMPLANT
PACK BASIN DAY SURGERY FS (CUSTOM PROCEDURE TRAY) ×2 IMPLANT
PENCIL SMOKE EVACUATOR (MISCELLANEOUS) ×2 IMPLANT
SLEEVE SCD COMPRESS KNEE MED (STOCKING) ×2 IMPLANT
SPIKE FLUID TRANSFER (MISCELLANEOUS) IMPLANT
SPONGE T-LAP 4X18 ~~LOC~~+RFID (SPONGE) ×2 IMPLANT
SUT MNCRL AB 4-0 PS2 18 (SUTURE) ×2 IMPLANT
SUT SILK 2 0 SH (SUTURE) IMPLANT
SUT VIC AB 3-0 SH 27 (SUTURE) ×2
SUT VIC AB 3-0 SH 27X BRD (SUTURE) ×2 IMPLANT
SYR CONTROL 10ML LL (SYRINGE) ×2 IMPLANT
TOWEL GREEN STERILE FF (TOWEL DISPOSABLE) ×2 IMPLANT
TRACER MAGTRACE VIAL (MISCELLANEOUS) IMPLANT
TRAY FAXITRON CT DISP (TRAY / TRAY PROCEDURE) ×2 IMPLANT
TUBE CONNECTING 20X1/4 (TUBING) IMPLANT
YANKAUER SUCT BULB TIP NO VENT (SUCTIONS) IMPLANT

## 2022-11-14 NOTE — Progress Notes (Signed)
Assisted Dr. Germeroth with left, pectoralis, ultrasound guided block. Side rails up, monitors on throughout procedure. See vital signs in flow sheet. Tolerated Procedure well. 

## 2022-11-14 NOTE — Anesthesia Procedure Notes (Signed)
Anesthesia Regional Block: Pectoralis block   Pre-Anesthetic Checklist: , timeout performed,  Correct Patient, Correct Site, Correct Laterality,  Correct Procedure, Correct Position, site marked,  Risks and benefits discussed,  Surgical consent,  Pre-op evaluation,  At surgeon's request and post-op pain management  Laterality: Left and N/A  Prep: chloraprep       Needles:   Needle Type: Stimiplex     Needle Length: 9cm      Additional Needles:   Procedures:,,,, ultrasound used (permanent image in chart),,    Narrative:  Start time: 11/14/2022 12:13 PM End time: 11/14/2022 12:43 PM Injection made incrementally with aspirations every 5 mL.  Performed by: Personally  Anesthesiologist: Nolon Nations, MD  Additional Notes: BP cuff, SpO2 and EKG monitors applied. Sedation begun.  Anesthetic injected incrementally, slowly, and after neg aspirations under direct ultrasound guidance. Good fascial spread noted. Patient tolerated well.

## 2022-11-14 NOTE — Anesthesia Procedure Notes (Signed)
Procedure Name: LMA Insertion Date/Time: 11/14/2022 1:42 PM  Performed by: Ezequiel Kayser, CRNAPre-anesthesia Checklist: Patient identified, Emergency Drugs available, Suction available and Patient being monitored Patient Re-evaluated:Patient Re-evaluated prior to induction Oxygen Delivery Method: Circle System Utilized Preoxygenation: Pre-oxygenation with 100% oxygen Induction Type: IV induction Ventilation: Mask ventilation without difficulty LMA: LMA inserted LMA Size: 4.0 Number of attempts: 1 Airway Equipment and Method: Bite block Placement Confirmation: positive ETCO2 Tube secured with: Tape Dental Injury: Teeth and Oropharynx as per pre-operative assessment

## 2022-11-14 NOTE — Op Note (Signed)
Tonya Vincent 11/14/2022   Pre-op Diagnosis: LEFT BREAST CANCER     Post-op Diagnosis: same  Procedure(s): LEFT BREAST RADIOACTIVE SEED GUIDED LUMPECTOMY x2 DEEP LEFT AXILLARY SENTINEL LYMPH NODE BIOPSY INJECTION OF MAG TRACE FOR LYMPH NODE MAPPING  Surgeon(s): Coralie Keens, MD Maczis, Carlena Hurl, PA-C  Anesthesia: General  Staff:  Circulator: Maurene Capes, RN; Merwyn Katos, RN Scrub Person: Buddy Duty A  Estimated Blood Loss: Minimal               Specimens: SENT TO PATH  Indications: This is a 71 year old female was found on recent screening mammography to have 2 separate small masses in the left breast at the 2 and 3 o'clock position 15 cm from the nipple areolar complex.  Biopsy of both masses showed invasive lobular carcinoma.  There was architectural distortion between the 2 areas and this was confirmed on MRI with no other abnormalities in either breast.  The decision was made to proceed with left breast radioactive seed guided lumpectomy x 2 and deep axillary sentinel lymph node biopsy.  Procedure: The patient was brought to the operating room identified as correct patient.  She was placed upon the operating table and general anesthesia was induced.  I next injected mag trace underneath in the left nipple-areolar complex and massaged the breast.  Her left breast and axilla were then prepped and draped in usual sterile fashion.  Using neoprobe I located both radioactive seeds.  Again, they were at the 2 and 3 o'clock position of the left breast at the very lateral edge.  I anesthetized the skin between the 2 areas with Marcaine and the performed elliptical incision with a scalpel.  With the aid of the neoprobe I then dissected down medial to both seeds going all the way down to the chest wall.  I then performed a wide lumpectomy incorporating both radioactive seeds and all the tissue in between the 2 and 1 specimen.  I dissected both superior and inferior  to the seeds and then laterally and posteriorly down to the chest wall.  I then completed a lumpectomy with the cautery.  I marked all margins with pain.  An x-ray was performed on the specimen confirming that both original biopsy clips and both radioactive seeds were in the specimen.  The specimen was then sent to pathology for evaluation. With the mag trace probe I then was able to identify an area increased uptake in the axilla through the breast incision.  I tunneled superiorly toward the axilla dissecting into the tissue and then was able to dissect in the deep axillary tissue.  With the probe I was then able to identify and palpate several lymph nodes which were removed together in 1 specimen with the cautery and sent to pathology for evaluation.  I achieved hemostasis in the axilla with surgical clips and the cautery.  We irrigated the incisions with normal saline.  I next by surgical clips around the lumpectomy cavity for marking purposes.  We then closed the deep tissue with interrupted 3-0 Vicryl sutures.  The subtenons tissue was closed with interrupted 3-0 Vicryl sutures and the skin was closed with running 4-0 Monocryl.  Dermabond was then applied.  The patient was placed in a breast binder.  She was then extubated in the operating room and taken in a stable condition to the recovery room.          Coralie Keens   Date: 11/14/2022  Time: 2:34 PM

## 2022-11-14 NOTE — Interval H&P Note (Signed)
History and Physical Interval Note: no change in H and P  11/14/2022 11:41 AM  Tonya Vincent  has presented today for surgery, with the diagnosis of LEFT BREAST CANCER.  The various methods of treatment have been discussed with the patient and family. After consideration of risks, benefits and other options for treatment, the patient has consented to  Procedure(s): LEFT BREAST RADIOACTIVE SEED GUIDED LUMPECTOMY x2 (Left) SENTINEL LYMPH NODE BIOPSY (Left) as a surgical intervention.  The patient's history has been reviewed, patient examined, no change in status, stable for surgery.  I have reviewed the patient's chart and labs.  Questions were answered to the patient's satisfaction.     Coralie Keens

## 2022-11-14 NOTE — Anesthesia Preprocedure Evaluation (Addendum)
Anesthesia Evaluation  Patient identified by MRN, date of birth, ID band Patient awake    Reviewed: Allergy & Precautions, NPO status , Patient's Chart, lab work & pertinent test results  Airway Mallampati: II  TM Distance: >3 FB Neck ROM: Full    Dental  (+) Dental Advisory Given, Missing,    Pulmonary shortness of breath, former smoker   Pulmonary exam normal breath sounds clear to auscultation       Cardiovascular hypertension, Pt. on medications Normal cardiovascular exam+ Valvular Problems/Murmurs  Rhythm:Regular Rate:Normal  Echo  1. Left ventricular ejection fraction, by estimation, is 65 to 70%. The left ventricle has normal function. The left ventricle has no regional wall motion abnormalities. Left ventricular diastolic parameters are consistent with Grade I diastolic dysfunction (impaired relaxation).   2. Right ventricular systolic function is normal. The right ventricular size is normal.   3. The mitral valve is normal in structure. No evidence of mitral valve regurgitation. No evidence of mitral stenosis.   4. The aortic valve is tricuspid. Aortic valve regurgitation is not visualized. No aortic stenosis is present.     Neuro/Psych  PSYCHIATRIC DISORDERS Anxiety Depression    negative neurological ROS     GI/Hepatic negative GI ROS, Neg liver ROS,,,  Endo/Other  diabetes    Renal/GU negative Renal ROS     Musculoskeletal  (+) Arthritis ,    Abdominal  (+) + obese  Peds  Hematology  (+) Blood dyscrasia, anemia   Anesthesia Other Findings   Reproductive/Obstetrics                             Anesthesia Physical Anesthesia Plan  ASA: 3  Anesthesia Plan: General   Post-op Pain Management: Regional block* and Tylenol PO (pre-op)*   Induction: Intravenous  PONV Risk Score and Plan: 3 and Ondansetron, Dexamethasone and Treatment may vary due to age or medical  condition  Airway Management Planned: LMA  Additional Equipment:   Intra-op Plan:   Post-operative Plan: Extubation in OR  Informed Consent: I have reviewed the patients History and Physical, chart, labs and discussed the procedure including the risks, benefits and alternatives for the proposed anesthesia with the patient or authorized representative who has indicated his/her understanding and acceptance.     Dental advisory given  Plan Discussed with: CRNA  Anesthesia Plan Comments:         Anesthesia Quick Evaluation

## 2022-11-14 NOTE — Progress Notes (Signed)
HPI:   Tonya Vincent was previously seen in the [facility] due to a personal history of breast and rectal cancer and concerns regarding a hereditary predisposition to cancer. Please refer to our prior cancer genetics clinic note for more information regarding our discussion, assessment and recommendations, at the time. Tonya Vincent recent genetic test results were disclosed to her, as were recommendations warranted by these results. These results and recommendations are discussed in more detail below.  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 She also has a history of rectal cancer, diagnosed at age 46.  MMR or MSI results not available.       Oncology History Overview Note  Malignant neoplasm of rectum, cT3N0M0 down staged to ypT2N0M0 after neoadjuvant chemotherapy and radiation therapy   Primary site: Colon and Rectum   Staging method: AJCC 7th Edition   Clinical: Stage I (T2, N0, M0) signed by [name], MD on 12/28/2013  1:49 PM   Pathologic: Stage I (T2, N0, cM0) signed by [name], MD on 12/28/2013  1:49 PM   Summary: Stage I (T2, N0, cM0)     History of rectal cancer  11/17/2009 Procedure   Colonoscopy and rectal biopsy confirmed moderately differentiated adenocarcinoma   11/18/2009 Imaging   Transrectal ultrasound place staging T3 lesion   11/18/2009 Imaging   Staging CT scan of the chest, abdomen and pelvis showed multiple lesions in the liver as well as paraspinal mass of unknown etiology   12/05/2009 - 04/03/2010 Chemotherapy   The patient completed new adjuvant chemotherapy with 5-FU and radiation therapy   12/14/2009 Imaging   PET CT scan show faint metabolic activity in the paraspinal mass with no activity in the liver   04/24/2010 Surgery   She had surgical resection with negative margins. Final pathology was T2, N0, M0  (down staged by chemoradiation therapy from T3, N0, M0)   11/19/2012 Imaging   MRI of the liver confirmed benign hemangioma   01/08/2014 Procedure   Colonoscopy and biopsy was negative   12/21/2014 Imaging   CT scan showed no recurrence of colon cancer. Incidentally, paraspinal mass is slightly larger.   10/04/2016 Procedure   She had repeat colonoscopy which showed two 3 mm polyps in the transverse colon, removed with a cold snare. Resected and retrieved. The examination was otherwise normal on direct and retroflexion views.   10/04/2016 Pathology Results   Surgical [P], transverse, polyp(s) - SERRATED POLYP WITH FOCAL FEATURES SUGGESTIVE OF SESSILE SERRATED POLYP/ADENOMA, ONE FRAGMENT. - HYPERPLASTIC POLYP WITHOUT DYSPLASIA, ONE FRAGMENT. - BENIGN COLORECTAL MUCOSA WITHOUT DYSPLASIA, ONE FRAGMENT. - SEE COMMENT. Microscopic Comment After routine specimen processing, three fragments are identified. One of the fragments demonstrates a serrated horizontally situated crypt, which is suggestive of superficial sampling of a sessile serrated polyp/adenoma (the differential includes another fragment of hyperplastic polyp). Although this is the case, the finding is very focal and not definitive.    11/09/2022 Genetic Testing   Negative genetics for [facility] Multi-Cancer +RNA Panel.  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 Report date is 11/09/2022.   The Multi-Cancer + RNA Panel offered by [facility] includes sequencing and/or deletion/duplication analysis of the following 70 genes:  AIP*, ALK, APC*, ATM*, AXIN2*, BAP1*, BARD1*, BLM*, BMPR1A*, BRCA1*, BRCA2*, BRIP1*, CDC73*, CDH1*, CDK4, CDKN1B*, CDKN2A, CHEK2*, CTNNA1*, DICER1*, EPCAM (del/dup only), EGFR, FH*, FLCN*, GREM1 (promoter dup  only), HOXB13, KIT, LZTR1, MAX*, MBD4, MEN1*, MET, MITF, MLH1*, MSH2*, MSH3*, MSH6*, MUTYH*, NF1*, NF2*, NTHL1*, PALB2*, PDGFRA, PMS2*, POLD1*, POLE*, POT1*, PRKAR1A*, PTCH1*, PTEN*, RAD51C*,  RAD51D*, RB1*, RET, SDHA* (sequencing only), SDHAF2*, SDHB*, SDHC*, SDHD*, SMAD4*, SMARCA4*, SMARCB1*, SMARCE1*, STK11*, SUFU*, TMEM127*, TP53*, TSC1*, TSC2*, VHL*. RNA analysis is performed for * genes.    11/09/2022 Genetic Testing   Negative genetics for [facility] Multi-Cancer +RNA Panel.  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 Report date is 11/09/2022.   The Multi-Cancer + RNA Panel offered by [facility] includes sequencing and/or deletion/duplication analysis of the following 70 genes:  AIP*, ALK, APC*, ATM*, AXIN2*, BAP1*, BARD1*, BLM*, BMPR1A*, BRCA1*, BRCA2*, BRIP1*, CDC73*, CDH1*, CDK4, CDKN1B*, CDKN2A, CHEK2*, CTNNA1*, DICER1*, EPCAM (del/dup only), EGFR, FH*, FLCN*, GREM1 (promoter dup only), HOXB13, KIT, LZTR1, MAX*, MBD4, MEN1*, MET, MITF, MLH1*, MSH2*, MSH3*, MSH6*, MUTYH*, NF1*, NF2*, NTHL1*, PALB2*, PDGFRA, PMS2*, POLD1*, POLE*, POT1*, PRKAR1A*, PTCH1*, PTEN*, RAD51C*, RAD51D*, RB1*, RET, SDHA* (sequencing only), SDHAF2*, SDHB*, SDHC*, SDHD*, SMAD4*, SMARCA4*, SMARCB1*, SMARCE1*, STK11*, SUFU*, TMEM127*, TP53*, TSC1*, TSC2*, VHL*. RNA analysis is performed for * genes.    Malignant neoplasm of upper-outer quadrant of left breast in female, estrogen receptor positive (Jay)  10/17/2022 Initial Diagnosis   Diffuse bilateral breast pains.  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%, HER2 3+ positive, Ki-67 40%, second mass biopsy revealed grade 2 ILC ER 95%, PR 70%, HER2 negative, Ki-67 15%   10/31/2022 Cancer Staging   Staging form: Breast, AJCC 8th Edition - Clinical: Stage IA (cT1a, cN0, cM0, G2, ER+, PR+, HER2+) - Signed by Nicholas Lose, MD on 10/31/2022 Stage prefix: Initial diagnosis Histologic grading system: 3 grade system   11/09/2022 Genetic Testing   Negative genetics for  [facility] Multi-Cancer +RNA Panel.  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 Report date is 11/09/2022.   The Multi-Cancer + RNA Panel offered by [facility] includes sequencing and/or deletion/duplication analysis of the following 70 genes:  AIP*, ALK, APC*, ATM*, AXIN2*, BAP1*, BARD1*, BLM*, BMPR1A*, BRCA1*, BRCA2*, BRIP1*, CDC73*, CDH1*, CDK4, CDKN1B*, CDKN2A, CHEK2*, CTNNA1*, DICER1*, EPCAM (del/dup only), EGFR, FH*, FLCN*, GREM1 (promoter dup only), HOXB13, KIT, LZTR1, MAX*, MBD4, MEN1*, MET, MITF, MLH1*, MSH2*, MSH3*, MSH6*, MUTYH*, NF1*, NF2*, NTHL1*, PALB2*, PDGFRA, PMS2*, POLD1*, POLE*, POT1*, PRKAR1A*, PTCH1*, PTEN*, RAD51C*, RAD51D*, RB1*, RET, SDHA* (sequencing only), SDHAF2*, SDHB*, SDHC*, SDHD*, SMAD4*, SMARCA4*, SMARCB1*, SMARCE1*, STK11*, SUFU*, TMEM127*, TP53*, TSC1*, TSC2*, VHL*. RNA analysis is performed for * genes.      FAMILY HISTORY:  We obtained a detailed, 4-generation family history.  Significant diagnoses are listed below:      Family History  Problem Relation Age of Onset   Leukemia Father 85   Breast cancer Maternal Aunt          dx > 48; mother's maternal half sister   Kidney cancer Maternal Uncle          dx > 2   Breast cancer Paternal Aunt          dx > 45   Breast cancer Cousin          dx > 41; paternal female cousin   Mesothelioma Cousin          paternal female cousin     Tonya Vincent is unaware of previous family history of genetic testing for  hereditary cancer risks. There is no reported Ashkenazi Jewish ancestry. There is no known consanguinity.    GENETIC TEST RESULTS:  The [facility] Multi-Cancer +RNA Panel found no pathogenic mutations.   The Multi-Cancer + RNA Panel offered by [facility] includes sequencing and/or deletion/duplication analysis of the following 70 genes:  AIP*, ALK, APC*, ATM*, AXIN2*, BAP1*, BARD1*, BLM*, BMPR1A*, BRCA1*, BRCA2*, BRIP1*, CDC73*, CDH1*, CDK4, CDKN1B*, CDKN2A, CHEK2*, CTNNA1*, DICER1*, EPCAM  (del/dup only), EGFR, FH*, FLCN*, GREM1 (promoter dup only), HOXB13, KIT, LZTR1, MAX*, MBD4, MEN1*, MET, MITF, MLH1*, MSH2*, MSH3*, MSH6*, MUTYH*, NF1*, NF2*, NTHL1*, PALB2*, PDGFRA, PMS2*, POLD1*, POLE*, POT1*, PRKAR1A*, PTCH1*, PTEN*, RAD51C*, RAD51D*, RB1*, RET, SDHA* (sequencing only), SDHAF2*, SDHB*, SDHC*, SDHD*, SMAD4*, SMARCA4*, SMARCB1*, SMARCE1*, STK11*, SUFU*, TMEM127*, TP53*, TSC1*, TSC2*, VHL*. RNA analysis is performed for * genes. .   The test report has been scanned into EPIC and is located under the Molecular Pathology section of the Results Review tab.  A portion of the result report is included below for reference. Genetic testing reported out on November 09, 2022.     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  At this time, it is unknown if this variant is associated with an increased risk for cancer or if it is benign, but most uncertain variants are reclassified to benign. It should not be used to make medical management decisions. With time, we suspect the laboratory will determine the significance of this variant, if any. If the laboratory reclassifies this variant, we will attempt to contact Tonya Vincent to discuss it further.   Even though a pathogenic variant was not identified, possible explanations for the cancer in the family may include: There may be no hereditary risk for cancer in the family. The cancers in Tonya Vincent and/or her family may be sporadic/familial or due to other genetic and environmental factors. There may be a gene mutation in one of these genes that current testing methods cannot detect but that chance is small. There could be another gene that has not yet been discovered, or that we have not yet tested, that is responsible for the cancer diagnoses in the family.  It is also possible there is a hereditary cause for the cancer in the family that Tonya Vincent did not  inherit.  Therefore, it is important to remain in touch with cancer genetics in the future so that we can continue to offer Tonya Vincent the most up to date genetic testing.    ADDITIONAL GENETIC TESTING:  We discussed with Tonya Vincent that her genetic testing was fairly extensive.  If there are additional relevant genes identified to increase cancer risk that can be analyzed in the future, we would be happy to discuss and coordinate this testing at that time.    CANCER SCREENING RECOMMENDATIONS:  Tonya Vincent test result is considered negative (normal).  This means that we have not identified a hereditary cause for her personal history of breast and rectal at this time.   An individual's cancer risk and medical management are not determined by genetic test results alone. Overall cancer risk assessment incorporates additional factors, including personal medical history, family history, and any available genetic information that may result in a personalized plan for cancer prevention and surveillance. Therefore, it is recommended she continue to follow the cancer management and screening guidelines provided by her oncology and primary healthcare provider.  RECOMMENDATIONS FOR FAMILY MEMBERS:   Individuals in this family might be at some  increased risk of developing cancer, over the general population risk, due to the family history of cancer.  Individuals in the family should notify their providers of the family history of cancer. We recommend women in this family have a yearly mammogram beginning at age 40, or 60 years younger than the earliest onset of cancer, an annual clinical breast exam, and perform monthly breast self-exams.   First degree relatives of those with colorectal cancer should receive colonoscopies beginning at age 74, or 10 years prior to the earliest diagnosis of colon cancer in the family, and receive colonoscopies at least every 5 years, or as recommended by their  gastroenterologist.   We do not recommend familial testing for the KIT variant of uncertain significance (VUS).  FOLLOW-UP:  Lastly, we discussed with Tonya Vincent that cancer genetics is a rapidly advancing field and it is possible that new genetic tests will be appropriate for her and/or her family members in the future. We encouraged her to remain in contact with cancer genetics on an annual basis so we can update her personal and family histories and let her know of advances in cancer genetics that may benefit this family.   Our contact number was provided. Tonya Vincent questions were answered to her satisfaction, and she knows she is welcome to call us at anytime with additional questions or concerns.   Flor Whitacre M. Joette Catching, [place], [facility] Genetic Counselor [email or web address] (P) [phone]

## 2022-11-14 NOTE — Transfer of Care (Signed)
Immediate Anesthesia Transfer of Care Note  Patient: Tonya Vincent  Procedure(s) Performed: LEFT BREAST RADIOACTIVE SEED GUIDED LUMPECTOMY x2 (Left: Breast) SENTINEL LYMPH NODE BIOPSY (Left: Axilla)  Patient Location: PACU  Anesthesia Type:General and Regional  Level of Consciousness: drowsy  Airway & Oxygen Therapy: Patient Spontanous Breathing and Patient connected to face mask oxygen  Post-op Assessment: Report given to RN and Post -op Vital signs reviewed and stable  Post vital signs: Reviewed and stable  Last Vitals:  Vitals Value Taken Time  BP 141/75 11/14/22 1452  Temp    Pulse 74 11/14/22 1454  Resp 11 11/14/22 1454  SpO2 95 % 11/14/22 1454  Vitals shown include unvalidated device data.  Last Pain:  Vitals:   11/14/22 1152  TempSrc: Oral  PainSc: 0-No pain      Patients Stated Pain Goal: 3 (70/14/10 3013)  Complications: No notable events documented.

## 2022-11-14 NOTE — Anesthesia Postprocedure Evaluation (Signed)
Anesthesia Post Note  Patient: Tonya Vincent  Procedure(s) Performed: LEFT BREAST RADIOACTIVE SEED GUIDED LUMPECTOMY x2 (Left: Breast) SENTINEL LYMPH NODE BIOPSY (Left: Axilla)     Patient location during evaluation: PACU Anesthesia Type: General Level of consciousness: awake Pain management: pain level controlled Vital Signs Assessment: post-procedure vital signs reviewed and stable Respiratory status: spontaneous breathing, nonlabored ventilation and respiratory function stable Cardiovascular status: blood pressure returned to baseline and stable Postop Assessment: no apparent nausea or vomiting Anesthetic complications: no   No notable events documented.  Last Vitals:  Vitals:   11/14/22 1500 11/14/22 1515  BP: (!) 150/84 (!) 142/81  Pulse: 73 67  Resp: 18 14  Temp:    SpO2: 92% 94%    Last Pain:  Vitals:   11/14/22 1515  TempSrc:   PainSc: 0-No pain                 Nilda Simmer

## 2022-11-14 NOTE — Discharge Instructions (Addendum)
Post Anesthesia Home Care Instructions  Activity: Get plenty of rest for the remainder of the day. A responsible individual must stay with you for 24 hours following the procedure.  For the next 24 hours, DO NOT: -Drive a car -Paediatric nurse -Drink alcoholic beverages -Take any medication unless instructed by your physician -Make any legal decisions or sign important papers.  Meals: Start with liquid foods such as gelatin or soup. Progress to regular foods as tolerated. Avoid greasy, spicy, heavy foods. If nausea and/or vomiting occur, drink only clear liquids until the nausea and/or vomiting subsides. Call your physician if vomiting continues.  Special Instructions/Symptoms: Your throat may feel dry or sore from the anesthesia or the breathing tube placed in your throat during surgery. If this causes discomfort, gargle with warm salt water. The discomfort should disappear within 24 hours.  If you had a scopolamine patch placed behind your ear for the management of post- operative nausea and/or vomiting:  1. The medication in the patch is effective for 72 hours, after which it should be removed.  Wrap patch in a tissue and discard in the trash. Wash hands thoroughly with soap and water. 2. You may remove the patch earlier than 72 hours if you experience unpleasant side effects which may include dry mouth, dizziness or visual disturbances. 3. Avoid touching the patch. Wash your hands with soap and water after contact with the patch.     Next dose ofTylenol may be given at [place] Office Phone Number [phone]  BREAST BIOPSY/ PARTIAL MASTECTOMY: POST OP INSTRUCTIONS  Always review your discharge instruction sheet given to you by the facility where your surgery was performed.  IF YOU HAVE DISABILITY OR FAMILY LEAVE FORMS, YOU MUST BRING THEM TO THE OFFICE FOR PROCESSING.  DO NOT GIVE THEM TO YOUR DOCTOR.  A prescription for pain medication may be  given to you upon discharge.  Take your pain medication as prescribed, if needed.  If narcotic pain medicine is not needed, then you may take acetaminophen (Tylenol) or ibuprofen (Advil) as needed. Take your usually prescribed medications unless otherwise directed If you need a refill on your pain medication, please contact your pharmacy.  They will contact our office to request authorization.  Prescriptions will not be filled after 5pm or on week-ends. You should eat very light the first 24 hours after surgery, such as soup, crackers, pudding, etc.  Resume your normal diet the day after surgery. Most patients will experience some swelling and bruising in the breast.  Ice packs and a good support bra will help.  Swelling and bruising can take several days to resolve.  It is common to experience some constipation if taking pain medication after surgery.  Increasing fluid intake and taking a stool softener will usually help or prevent this problem from occurring.  A mild laxative (Milk of Magnesia or Miralax) should be taken according to package directions if there are no bowel movements after 48 hours. Unless discharge instructions indicate otherwise, you may remove your bandages 24-48 hours after surgery, and you may shower at that time.  You may have steri-strips (small skin tapes) in place directly over the incision.  These strips should be left on the skin for 7-10 days.  If your surgeon used skin glue on the incision, you may shower in 24 hours.  The glue will flake off over the next 2-3 weeks.  Any sutures or staples will be removed at the office during your follow-up  visit. ACTIVITIES:  You may resume regular daily activities (gradually increasing) beginning the next day.  Wearing a good support bra or sports bra minimizes pain and swelling.  You may have sexual intercourse when it is comfortable. You may drive when you no longer are taking prescription pain medication, you can comfortably wear a  seatbelt, and you can safely maneuver your car and apply brakes. RETURN TO WORK:  ______________________________________________________________________________________ Dennis Bast should see your doctor in the office for a follow-up appointment approximately two weeks after your surgery.  Your doctor's nurse will typically make your follow-up appointment when she calls you with your pathology report.  Expect your pathology report 2-3 business days after your surgery.  You may call to check if you do not hear from [place] after three days. OTHER INSTRUCTIONS: YOU MAY REMOVE THE BINDER AND SHOWER STARTING TOMORROW THEN PUT THE BINDER BACK ON ICE PACK, TYLENOL, AND IBUPROFEN ALSO FOR PAIN__ NO VIGOROUS ACTIVITY FOR ONE WEEK _____________________________________________________________________________________________ _____________________________________________________________________________________________________________________________________ _____________________________________________________________________________________________________________________________________ _____________________________________________________________________________________________________________________________________  WHEN TO CALL YOUR DOCTOR: Fever over 101.0 Nausea and/or vomiting. Extreme swelling or bruising. Continued bleeding from incision. Increased pain, redness, or drainage from the incision.  The clinic staff is available to answer your questions during regular business hours.  Please don't hesitate to call and ask to speak to one of the nurses for clinical concerns.  If you have a medical emergency, go to the nearest emergency room or call 911.  A surgeon from [facility] is always on call at the hospital.  For further questions, please visit [email or web address]

## 2022-11-15 ENCOUNTER — Encounter (HOSPITAL_BASED_OUTPATIENT_CLINIC_OR_DEPARTMENT_OTHER): Payer: Self-pay | Admitting: Surgery

## 2022-11-15 DIAGNOSIS — C50912 Malignant neoplasm of unspecified site of left female breast: Secondary | ICD-10-CM | POA: Diagnosis not present

## 2022-11-21 ENCOUNTER — Encounter: Payer: Self-pay | Admitting: *Deleted

## 2022-11-21 ENCOUNTER — Telehealth: Payer: Self-pay | Admitting: *Deleted

## 2022-11-21 NOTE — Telephone Encounter (Signed)
Requested pathology to repeat prognostics are final path.

## 2022-11-28 ENCOUNTER — Encounter: Payer: Self-pay | Admitting: *Deleted

## 2022-11-30 ENCOUNTER — Encounter: Payer: Self-pay | Admitting: *Deleted

## 2022-11-30 LAB — SURGICAL PATHOLOGY

## 2022-12-03 NOTE — Progress Notes (Signed)
Patient Care Team: [name], MD as PCP - General (Family Medicine) [name], MD as Consulting Physician (Hematology and Oncology) Rockwell Germany, RN as Oncology Nurse Navigator Mauro Kaufmann, RN as Oncology Nurse Navigator Nicholas Lose, MD as Consulting Physician (Hematology and Oncology) Coralie Keens, MD as Consulting Physician (General Surgery) Gery Pray, MD as Consulting Physician (Radiation Oncology)  DIAGNOSIS: No diagnosis found.  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 after neoadjuvant chemotherapy and radiation therapy   Primary site: Colon and Rectum   Staging method: AJCC 7th Edition   Clinical: Stage I (T2, N0, M0) signed by [name], MD on 12/28/2013  1:49 PM   Pathologic: Stage I (T2, N0, cM0) signed by [name], MD on 12/28/2013  1:49 PM   Summary: Stage I (T2, N0, cM0)     History of rectal cancer  11/17/2009 Procedure   Colonoscopy and rectal biopsy confirmed moderately differentiated adenocarcinoma   11/18/2009 Imaging   Transrectal ultrasound place staging T3 lesion   11/18/2009 Imaging   Staging CT scan of the chest, abdomen and pelvis showed multiple lesions in the liver as well as paraspinal mass of unknown etiology   12/05/2009 - 04/03/2010 Chemotherapy   The patient completed new adjuvant chemotherapy with 5-FU and radiation therapy   12/14/2009 Imaging   PET CT scan show faint metabolic activity in the paraspinal mass with no activity in the liver   04/24/2010 Surgery   She had surgical resection with negative margins. Final pathology was T2, N0, M0 (down staged by chemoradiation therapy from T3, N0, M0)   11/19/2012 Imaging   MRI of the liver confirmed benign hemangioma   01/08/2014 Procedure   Colonoscopy and biopsy was negative   12/21/2014 Imaging   CT scan showed no recurrence of colon cancer. Incidentally, paraspinal mass is slightly  larger.   10/04/2016 Procedure   She had repeat colonoscopy which showed two 3 mm polyps in the transverse colon, removed with a cold snare. Resected and retrieved. The examination was otherwise normal on direct and retroflexion views.   10/04/2016 Pathology Results   Surgical [P], transverse, polyp(s) - SERRATED POLYP WITH FOCAL FEATURES SUGGESTIVE OF SESSILE SERRATED POLYP/ADENOMA, ONE FRAGMENT. - HYPERPLASTIC POLYP WITHOUT DYSPLASIA, ONE FRAGMENT. - BENIGN COLORECTAL MUCOSA WITHOUT DYSPLASIA, ONE FRAGMENT. - SEE COMMENT. Microscopic Comment After routine specimen processing, three fragments are identified. One of the fragments demonstrates a serrated horizontally situated crypt, which is suggestive of superficial sampling of a sessile serrated polyp/adenoma (the differential includes another fragment of hyperplastic polyp). Although this is the case, the finding is very focal and not definitive.    11/09/2022 Genetic Testing   Negative genetics for [facility] Multi-Cancer +RNA Panel.  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 Report date is 11/09/2022.   The Multi-Cancer + RNA Panel offered by [facility] includes sequencing and/or deletion/duplication analysis of the following 70 genes:  AIP*, ALK, APC*, ATM*, AXIN2*, BAP1*, BARD1*, BLM*, BMPR1A*, BRCA1*, BRCA2*, BRIP1*, CDC73*, CDH1*, CDK4, CDKN1B*, CDKN2A, CHEK2*, CTNNA1*, DICER1*, EPCAM (del/dup only), EGFR, FH*, FLCN*, GREM1 (promoter dup only), HOXB13, KIT, LZTR1, MAX*, MBD4, MEN1*, MET, MITF, MLH1*, MSH2*, MSH3*, MSH6*, MUTYH*, NF1*, NF2*, NTHL1*, PALB2*, PDGFRA, PMS2*, POLD1*, POLE*, POT1*, PRKAR1A*, PTCH1*, PTEN*, RAD51C*, RAD51D*, RB1*, RET, SDHA* (sequencing only), SDHAF2*, SDHB*, SDHC*, SDHD*, SMAD4*, SMARCA4*, SMARCB1*, SMARCE1*, STK11*, SUFU*, TMEM127*, TP53*, TSC1*, TSC2*, VHL*. RNA analysis is performed for * genes.    11/09/2022 Genetic Testing  Negative genetics for [facility] Multi-Cancer +RNA Panel.  Variant of  uncertain significance in KIT at c.839C>T (p.Ala280Val). Report date is 11/09/2022.   The Multi-Cancer + RNA Panel offered by [facility] includes sequencing and/or deletion/duplication analysis of the following 70 genes:  AIP*, ALK, APC*, ATM*, AXIN2*, BAP1*, BARD1*, BLM*, BMPR1A*, BRCA1*, BRCA2*, BRIP1*, CDC73*, CDH1*, CDK4, CDKN1B*, CDKN2A, CHEK2*, CTNNA1*, DICER1*, EPCAM (del/dup only), EGFR, FH*, FLCN*, GREM1 (promoter dup only), HOXB13, KIT, LZTR1, MAX*, MBD4, MEN1*, MET, MITF, MLH1*, MSH2*, MSH3*, MSH6*, MUTYH*, NF1*, NF2*, NTHL1*, PALB2*, PDGFRA, PMS2*, POLD1*, POLE*, POT1*, PRKAR1A*, PTCH1*, PTEN*, RAD51C*, RAD51D*, RB1*, RET, SDHA* (sequencing only), SDHAF2*, SDHB*, SDHC*, SDHD*, SMAD4*, SMARCA4*, SMARCB1*, SMARCE1*, STK11*, SUFU*, TMEM127*, TP53*, TSC1*, TSC2*, VHL*. RNA analysis is performed for * genes.    Malignant neoplasm of upper-outer quadrant of left breast in female, estrogen receptor positive ([place])  10/17/2022 Initial Diagnosis   Diffuse bilateral breast pains.  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 Report date is 11/09/2022.   The Multi-Cancer + RNA Panel offered by [facility] includes sequencing and/or deletion/duplication analysis of the following 70 genes:  AIP*, ALK, APC*, ATM*, AXIN2*, BAP1*, BARD1*,  BLM*, BMPR1A*, BRCA1*, BRCA2*, BRIP1*, CDC73*, CDH1*, CDK4, CDKN1B*, CDKN2A, CHEK2*, CTNNA1*, DICER1*, EPCAM (del/dup only), EGFR, FH*, FLCN*, GREM1 (promoter dup only), HOXB13, KIT, LZTR1, MAX*, MBD4, MEN1*, MET, MITF, MLH1*, MSH2*, MSH3*, MSH6*, MUTYH*, NF1*, NF2*, NTHL1*, PALB2*, PDGFRA, PMS2*, POLD1*, POLE*, POT1*, PRKAR1A*, PTCH1*, PTEN*, RAD51C*, RAD51D*, RB1*, RET, SDHA* (sequencing only), SDHAF2*, SDHB*, SDHC*, SDHD*, SMAD4*, SMARCA4*, SMARCB1*, SMARCE1*, STK11*, SUFU*, TMEM127*, TP53*, TSC1*, TSC2*, VHL*. RNA analysis is performed for * genes.      CHIEF COMPLIANT: Follow-up after surgery  INTERVAL HISTORY: Tonya Vincent is a  72 y.o. female is here because of recent diagnosis of left breast cancer.  Patient was complaining of diffuse bilateral breast pains. She presents to the clinic for a follow-up after surgery.   ALLERGIES:  is allergic to advanced hand sanitizer [alcohol], norgesic [orphenadrine-aspirin-caffeine], hydrocodone, and antibacterial hand soap [triclosan].  MEDICATIONS:  Current Outpatient Medications  Medication Sig Dispense Refill   amLODipine (NORVASC) 10 MG tablet Take 10 mg by mouth at bedtime.      aspirin EC 81 MG tablet Take 81 mg by mouth at bedtime.      atorvastatin (LIPITOR) 10 MG tablet Take 10 mg by mouth daily.     empagliflozin (JARDIANCE) 25 MG TABS tablet Take 25 mg by mouth daily with supper.      ibuprofen (ADVIL) 800 MG tablet Take 800 mg by mouth every 8 (eight) hours as needed.     latanoprost (XALATAN) 0.005 % ophthalmic solution Place 1 drop into both eyes at bedtime.      olmesartan (BENICAR) 40 MG tablet Take 40 mg by mouth daily.     oxyCODONE (OXY IR/ROXICODONE) 5 MG immediate release  tablet Take 1 tablet (5 mg total) by mouth every 6 (six) hours as needed for moderate pain, severe pain or breakthrough pain. 20 tablet 0   Semaglutide, 1 MG/DOSE, 2 MG/1.5ML SOPN Inject 1 mg into the skin every 7 (seven) days.      spironolactone  (ALDACTONE) 25 MG tablet Take 1 tablet by mouth 2 (two) times daily.     vortioxetine HBr (TRINTELLIX) 10 MG TABS tablet Take 10 mg by mouth daily with supper.     No current facility-administered medications for this visit.    PHYSICAL EXAMINATION: ECOG PERFORMANCE STATUS: {CHL ONC ECOG [phone]}  There were no vitals filed for this visit. There were no vitals filed for this visit.  BREAST:*** No palpable masses or nodules in either right or left breasts. No palpable axillary supraclavicular or infraclavicular adenopathy no breast tenderness or nipple discharge. (exam performed in the presence of a chaperone)  LABORATORY DATA:  I have reviewed the data as listed    Latest Ref Rng & Units 10/31/2022   12:19 PM 05/27/2020    2:43 PM 04/08/2020    4:22 PM  CMP  Glucose 70 - 99 mg/dL 101  112  135   BUN 8 - 23 mg/dL _0 Creatinine 0.44 - 1.00 mg/dL 1.22  0.97  1.08   Sodium 135 - 145 mmol/L 141  138  139   Potassium 3.5 - 5.1 mmol/L 4.4  3.2  3.2   Chloride 98 - 111 mmol/L 107  104  106   CO2 22 - 32 mmol/L _1 Calcium 8.9 - 10.3 mg/dL 9.9  9.0  8.9   Total Protein 6.5 - 8.1 g/dL 8.4  7.8    Total Bilirubin 0.3 - 1.2 mg/dL 0.3  0.4    Alkaline Phos 38 - 126 U/L 101  112    AST 15 - 41 U/L 14  16    ALT 0 - 44 U/L 16  15      Lab Results  Component Value Date   WBC 5.1 10/31/2022   HGB 14.1 10/31/2022   HCT 44.2 10/31/2022   MCV 83.9 10/31/2022   PLT 338 10/31/2022   NEUTROABS 2.6 10/31/2022    ASSESSMENT & PLAN:  No problem-specific Assessment & Plan notes found for this encounter.    No orders of the defined types were placed in this encounter.  The patient has a good understanding of the overall plan. she agrees with it. she will call with any problems that may develop before the next visit here. Total time spent: 30 mins including face to face time and time spent for planning, charting and co-ordination of care   Suzzette Righter,  [place] 12/03/22    I Gardiner Coins am acting as a Education administrator for [employer]  ***

## 2022-12-06 ENCOUNTER — Other Ambulatory Visit: Payer: Self-pay | Admitting: Surgery

## 2022-12-06 ENCOUNTER — Other Ambulatory Visit: Payer: Self-pay

## 2022-12-06 ENCOUNTER — Inpatient Hospital Stay: Payer: Medicare HMO | Attending: Hematology and Oncology | Admitting: Hematology and Oncology

## 2022-12-06 ENCOUNTER — Other Ambulatory Visit: Payer: Self-pay | Admitting: *Deleted

## 2022-12-06 ENCOUNTER — Encounter: Payer: Self-pay | Admitting: *Deleted

## 2022-12-06 VITALS — BP 141/76 | HR 87 | Temp 98.2°F | Wt 199.8 lb

## 2022-12-06 DIAGNOSIS — Z5111 Encounter for antineoplastic chemotherapy: Secondary | ICD-10-CM | POA: Diagnosis present

## 2022-12-06 DIAGNOSIS — C50412 Malignant neoplasm of upper-outer quadrant of left female breast: Secondary | ICD-10-CM | POA: Diagnosis not present

## 2022-12-06 DIAGNOSIS — Z170 Estrogen receptor positive status [ER+]: Secondary | ICD-10-CM | POA: Insufficient documentation

## 2022-12-06 DIAGNOSIS — Z79899 Other long term (current) drug therapy: Secondary | ICD-10-CM | POA: Insufficient documentation

## 2022-12-06 DIAGNOSIS — C50512 Malignant neoplasm of lower-outer quadrant of left female breast: Secondary | ICD-10-CM | POA: Diagnosis not present

## 2022-12-06 DIAGNOSIS — Z923 Personal history of irradiation: Secondary | ICD-10-CM | POA: Insufficient documentation

## 2022-12-06 DIAGNOSIS — I427 Cardiomyopathy due to drug and external agent: Secondary | ICD-10-CM

## 2022-12-06 MED ORDER — DEXAMETHASONE 4 MG PO TABS: 4.0000 mg | ORAL_TABLET | Freq: Every day | ORAL | 0 refills | Status: DC

## 2022-12-06 MED ORDER — ONDANSETRON HCL 8 MG PO TABS: 8.0000 mg | ORAL_TABLET | Freq: Three times a day (TID) | ORAL | 1 refills | Status: DC | PRN

## 2022-12-06 MED ORDER — PROCHLORPERAZINE MALEATE 10 MG PO TABS: 10.0000 mg | ORAL_TABLET | Freq: Four times a day (QID) | ORAL | 1 refills | Status: DC | PRN

## 2022-12-06 MED ORDER — LIDOCAINE-PRILOCAINE 2.5-2.5 % EX CREA: TOPICAL_CREAM | CUTANEOUS | 3 refills | Status: DC

## 2022-12-06 NOTE — Assessment & Plan Note (Signed)
10/17/2022 Diffuse bilateral breast pains.  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 I discussed the margins as well as lymph node surgeries. We also discussed the final staging along with previously performed ER/PR and HER-2/neu testing.  Treatment plan: Adjuvant chemotherapy with TCHP followed by Herceptin Perjeta maintenance Adjuvant radiation Adjuvant antiestrogen therapy

## 2022-12-06 NOTE — Progress Notes (Signed)
START ON PATHWAY REGIMEN - Breast     Cycle 1: A cycle is 21 days:     Trastuzumab-xxxx      Docetaxel      Carboplatin    Cycles 2 through 6: A cycle is every 21 days:     Trastuzumab-xxxx      Docetaxel      Carboplatin    Cycles 7 through 17: A cycle is every 21 days:     Trastuzumab-xxxx   **Always confirm dose/schedule in your pharmacy ordering system**  Patient Characteristics: Postoperative without Neoadjuvant Therapy (Pathologic Staging), Invasive Disease, Adjuvant Therapy, HER2 Positive, ER Positive, Node Positive, pT2, pN53m, Tumor Size ? 3 cm Therapeutic Status: Postoperative without Neoadjuvant Therapy (Pathologic Staging) AJCC Grade: G2 AJCC N Category: [id number] M Category: cM0 ER Status: Positive (+) AJCC 8 Stage Grouping: IB HER2 Status: Positive (+) Oncotype Dx Recurrence Score: Not Appropriate AJCC T Category: pT2 PR Status: Positive (+) Intent of Therapy: Curative Intent, Discussed with Patient

## 2022-12-06 NOTE — Research (Signed)
S2205, ICE COMPRESS: RANDOMIZED TRIAL OF LIMB CRYOCOMPRESSION VERSUS CONTINUOUS COMPRESSION VERSUS LOW CYCLIC COMPRESSION FOR THE PREVENTION  OF TAXANE-INDUCED PERIPHERAL NEUROPATHY  [name] was identified by Dr Lindi Adie as a potential candidate for the above listed study. This Clinical Research Nurse met with IDELIA CAUDELL, [id number], on 12/06/22 in a manner and location that ensures patient privacy to discuss participation in the above listed research study. Patient is Unaccompanied. A copy of the informed consent document and separate HIPAA Authorization was provided to the patient. Patient reads, speaks, and understands [place].    Patient was provided with the business card of this Nurse and encouraged to contact the research team with any questions. Approximately 20 minutes were spent with the patient reviewing the informed consent documents. Patient was provided the option of taking informed consent documents home to review and was encouraged to review at their convenience with their support network, including other care providers. Patient took the consent documents home to review.  Patient agreed to follow-up call from the research team on Tuesday, 12/11/22.  Vickii Penna, RN, BSN, CPN Clinical Research Nurse I [phone]  12/06/2022 10:17 AM

## 2022-12-07 ENCOUNTER — Other Ambulatory Visit: Payer: Self-pay

## 2022-12-10 ENCOUNTER — Ambulatory Visit (HOSPITAL_COMMUNITY)
Admission: RE | Admit: 2022-12-10 | Discharge: 2022-12-10 | Disposition: A | Discharge status: Home / Self Care | Payer: Medicare HMO | Source: Ambulatory Visit | Attending: Hematology and Oncology | Admitting: Hematology and Oncology

## 2022-12-10 ENCOUNTER — Encounter: Payer: Self-pay | Admitting: *Deleted

## 2022-12-10 DIAGNOSIS — C50412 Malignant neoplasm of upper-outer quadrant of left female breast: Secondary | ICD-10-CM

## 2022-12-10 DIAGNOSIS — E119 Type 2 diabetes mellitus without complications: Secondary | ICD-10-CM | POA: Insufficient documentation

## 2022-12-10 DIAGNOSIS — Z0189 Encounter for other specified special examinations: Secondary | ICD-10-CM

## 2022-12-10 DIAGNOSIS — Z01818 Encounter for other preprocedural examination: Secondary | ICD-10-CM | POA: Insufficient documentation

## 2022-12-10 DIAGNOSIS — Z170 Estrogen receptor positive status [ER+]: Secondary | ICD-10-CM | POA: Diagnosis not present

## 2022-12-10 DIAGNOSIS — I427 Cardiomyopathy due to drug and external agent: Secondary | ICD-10-CM

## 2022-12-10 LAB — ECHOCARDIOGRAM COMPLETE
Area-P 1/2: 3.15 cm2
Calc EF: 61.3 %
S' Lateral: 2.5 cm
Single Plane A2C EF: 64.5 %
Single Plane A4C EF: 60.2 %

## 2022-12-10 NOTE — Progress Notes (Signed)
  Echocardiogram 2D Echocardiogram has been performed.  Tonya Vincent M 12/10/2022, 11:59 AM

## 2022-12-11 ENCOUNTER — Telehealth: Payer: Self-pay

## 2022-12-11 NOTE — Telephone Encounter (Signed)
S2205, ICE COMPRESS: RANDOMIZED TRIAL OF LIMB CRYOCOMPRESSION VERSUS CONTINUOUS COMPRESSION VERSUS LOW CYCLIC COMPRESSION FOR THE PREVENTION OF TAXANE-INDUCED PERIPHERAL NEUROPATHY  Called and spoke with patient to follow-up on interest in above-listed study. Patient stated she is interested and would like to participate. Patient agreed to come to clinic on Friday, 1/12 for consent and baseline assessments. Appointment scheduled for 1230pm per patient request. Patient thanked for her time and consideration and encouraged to call with any questions in the meantime.   Vickii Penna, RN, BSN, CPN Clinical Research Nurse I [phone]  12/11/2022 12:10 PM

## 2022-12-13 NOTE — Therapy (Signed)
OUTPATIENT PHYSICAL THERAPY BREAST CANCER POST OP FOLLOW UP   Patient Name: Tonya Vincent MRN: [id number] DOB:[birth year], 72 y.o., female Today's Date: 12/14/2022  END OF SESSION:  PT End of Session - 12/14/22 1112     Visit Number 2    Number of Visits 2    Date for PT Re-Evaluation 12/14/22    PT Start Time 1115    PT Stop Time 1200    PT Time Calculation (min) 45 min    Activity Tolerance Patient tolerated treatment well    Behavior During Therapy [facility] for tasks assessed/performed             Past Medical History:  Diagnosis Date   Anemia    Anxiety    Arthritis    Breast cancer ([place]) 10/2022   left breast ILC   Complication of anesthesia    difficulty waking up   Depression    Diabetes mellitus    type 2 DM   Dyspnea    with exertion   Heart murmur    History of rectal cancer 10/04/2011   Hypertension    Overactive bladder    Paraspinal mass 12/29/2014   Rectal cancer ([place]) dx'd 11/16/09   xrt comp 01/2010; chemo comp 01/2011   Tubular adenoma of colon 01/08/2014   Past Surgical History:  Procedure Laterality Date   ABDOMINAL HYSTERECTOMY     for uterine fibroids   BREAST LUMPECTOMY WITH RADIOACTIVE SEED LOCALIZATION Left 11/14/2022   Procedure: LEFT BREAST RADIOACTIVE SEED GUIDED LUMPECTOMY x2;  Surgeon: Coralie Keens, MD;  Location: [facility];  Service: General;  Laterality: Left;   COLON SURGERY N/A 04/24/2010   "took out half rectum"; rectal cancer   EYE SURGERY     lasik surgery   HAND SURGERY     carpal tunnel- left   HEMORRHOID SURGERY N/A 1980   ORIF ANKLE FRACTURE Right 04/11/2020   Procedure: OPEN REDUCTION INTERNAL FIXATION (ORIF) ANKLE FRACTURE;  Surgeon: Carole Civil, MD;  Location: [facility];  Service: Orthopedics;  Laterality: Right;   PORT-A-CATH REMOVAL N/A 02/17/2019   Procedure: REMOVAL PORT-A-CATH;  Surgeon: Coralie Keens, MD;  Location: [facility];  Service: General;  Laterality: N/A;   PORTACATH  PLACEMENT     2011   RECTAL SURGERY     2010   SENTINEL NODE BIOPSY Left 11/14/2022   Procedure: SENTINEL LYMPH NODE BIOPSY;  Surgeon: Coralie Keens, MD;  Location: [facility];  Service: General;  Laterality: Left;   [place] EXTRACTION     Patient Active Problem List   Diagnosis Date Noted   Genetic testing 11/12/2022   Malignant neoplasm of lower-outer quadrant of left breast of female, estrogen receptor positive ([place]) 10/30/2022   Malignant neoplasm of upper-outer quadrant of left breast in female, estrogen receptor positive ([place]) 10/23/2022   S/P ORIF (open reduction internal fixation) fracture right ankle 04/11/20 04/21/2020   Closed trimalleolar fracture of right ankle    History of colonic polyps 12/18/2019   Fecal smearing 12/18/2019   Left arm pain 02/04/2019   Rectal bleeding 02/06/2018   Microcytosis 01/01/2017   Essential hypertension 01/02/2016   Change in stool caliber 12/31/2014   Paraspinal mass 12/29/2014   Anemia in chronic illness 12/29/2014   History of rectal cancer 10/04/2011    PCP: Beatrix Shipper, MD  REFERRING PROVIDER: Coralie Keens, MD  REFERRING DIAG: Left Breast Cancer  THERAPY DIAG:  Malignant neoplasm of upper-outer quadrant of left breast in female,  estrogen receptor positive ([place])  Abnormal posture  Rationale for Evaluation and Treatment: Rehabilitation  ONSET DATE: 10/12/2022  SUBJECTIVE:                                                                                                                                                                                           SUBJECTIVE STATEMENT: I have some pain in the axillary region.  I have  a seroma that the MD may drain on Monday when they put in my port. She feels like she is doing well and she has been doing her exercises. 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 It measures 4 mm and 8 mm and is located in the upper outer quadrant. The smaller mass is triple positive with a Ki67 of 40%. The larger mass is ER/PR positive and HER2 negative with a Ki67 of 15% . Pt had left lumpectomy with SLNB on 11/14/2022. 1/8 LN's positive. She will be having chemotherapy with Herceptin maintenance, radiation and anti estrogens. There is a deep seroma at the lumpectomy site identified by surgeon with no further treatment required at present per MD. Pt is a part time pharmacist.  PATIENT GOALS:  Reassess how my recovery is going related to arm function, pain, and swelling.  PAIN:  Are you having pain? Yes: NPRS scale: 5/10 Pain location: irritated in axillary region Pain description: constant,achy Aggravating factors: it is just there Relieving factors: sometimes ice  PRECAUTIONS: Recent Surgery, left UE Lymphedema risk, Right shoulder pain  ACTIVITY LEVEL / LEISURE: does not exercise   OBJECTIVE:   PATIENT SURVEYS:  QUICK DASH: 9%  OBSERVATIONS: Left breast incision healed with small scabs present near the anterior part of incision. Palpable tender seroma like area with defined borders present.  Cording noted in axillary region with 1 or 2 small cords running through the elbow region  POSTURE:  Forward head and rounded shoulders posture   LYMPHEDEMA ASSESSMENT:   UPPER EXTREMITY AROM/PROM:   A/PROM RIGHT   eval    Shoulder extension 41  Shoulder flexion 163  Shoulder abduction 169  Shoulder internal rotation 69  Shoulder external rotation 88                          (Blank rows = not tested)   A/PROM LEFT   eval LEFT  Shoulder extension 39 41  Shoulder flexion 158 162  Shoulder abduction 172 175  Shoulder internal rotation 59 60  Shoulder external rotation 79 94                          (  Blank rows = not tested)   CERVICAL AROM: All within normal limits   UPPER EXTREMITY STRENGTH: WNL   LYMPHEDEMA ASSESSMENTS:     LANDMARK RIGHT   eval RIGHT 12/14/2021  10 cm proximal to olecranon process 33.5   Olecranon process 27.2   10 cm proximal to ulnar styloid process 25.9   Just proximal to ulnar styloid process 17.9 19.0  Across hand at thumb web space 21   At base of 2nd digit 7.2   (Blank rows = not tested)   LANDMARK LEFT   eval LEFT 12/26/2022  10 cm proximal to olecranon process 34.2 34  Olecranon process 27 27.2  10 cm proximal to ulnar styloid process 24.7 24.7  Just proximal to ulnar styloid process 17.9 18.2  Across hand at thumb web space 20.9 21.3  At base of 2nd digit 7.3 7.3  (Blank rows = not tested)     Surgery type/Date: Left lumpectomy with SLNB Number of lymph nodes removed: 1/8 LN's positive Current/past treatment (chemo, radiation, hormone therapy): pending chemo, Herceptin maintenance, radiation, anti estrogens Other symptoms:  Heaviness/tightness No Pain Yes Pitting edema No Infections No Decreased scar mobility Yes Stemmer sign No  PATIENT EDUCATION:  Education details: ABC class, SOZO screens, scar massage, supine wand flex and scaption, pad made for axillary border of bra,Gave bone shaped pillow to assist with swelling. Discussed pt to return for PT if cords don't resolve or start to bother her, or if she/MD feel she would benefit from MLD for Seroma area Person educated: Patient Education method: Explanation, Demonstration, and Handouts Education comprehension: verbalized understanding and returned demonstration  HOME EXERCISE PROGRAM: Reviewed previously given post op HEP. Instructed in supine wand flexion and scaption to assist with mild stretch of cords and updated HEP, Tried wall chest stretch single arm and NTS but pt was feeling in posterior shoulder and not where we were trying to stretch so did not add to HEP  ASSESSMENT:  CLINICAL IMPRESSION: Pt is s/p left lumpectomy with SLNB on 12/10/2022 with 1/8 LN's positive. She will be having chemotherapy,  Herceptin maintenance, radiation and anti-estrogens. She presents today with excellent left shoulder ROM and no signs of lymphedema. Her incision is healing well with an area of induration with well defined borders which MD describes as seroma . Pt indicates he may drain this on Monday when he puts in her port.  A foam pad was made for her bra which alleviated the anterior axillary pain she was having. She was given supine wand exercises to do at home to try and assist with cording. Cording is not limiting her ROM and is not painful for her. She was instructed in scar massage to perform when the scabs are fully off.  Pt was advised to see the MD on Monday and see if he drains her seroma. If she feels she may require MLD she will discuss this with the MD, or if cording gets worse or becomes limiting she will contacting us to come for regularly scheduled PT visits.  Pt will benefit from skilled therapeutic intervention to improve on the following deficits: Decreased knowledge of precautions, impaired UE functional use, pain, decreased ROM, postural dysfunction.   PT treatment/interventions: ADL/Self care home management, Therapeutic exercises, Patient/Family education, Self Care, scar mobilization, and Re-evaluation   GOALS: Goals reviewed with patient? Yes  LONG TERM GOALS:  (STG=LTG)  GOALS Name Target Date  Goal status  1 Pt will demonstrate she has regained full shoulder ROM and  function post operatively compared to baselines.  Baseline: 12/14/2022 MET  '2     3     4        '$ PLAN:  PT FREQUENCY/DURATION: no further needs presently, but pt may need assistance with cording if it becomes limiting or painful or MLD to area of seroma. Pt advised to discuss with MD prn.  PLAN FOR NEXT SESSION: DC to independent self management.   [facility]  [address]  [place] [address]  [phone]  After Breast Cancer Class It is recommended you attend the ABC  class to be educated on lymphedema risk reduction. This class is free of charge and lasts for 1 hour. It is a 1-time class. You will need to download the Webex app either on your phone or computer. We will send you a link the night before or the morning of the class. You should be able to click on that link to join the class. This is not a confidential class. You don't have to turn your camera on, but other participants may be able to see your email address.  Scar massage You can begin gentle scar massage to you incision sites. Gently place one hand on the incision and move the skin (without sliding on the skin) in various directions. Do this for a few minutes and then you can gently massage either coconut oil or vitamin E cream into the scars.  Compression garment You should continue wearing your compression bra until you feel like you no longer have swelling.  Home exercise Program Continue doing the exercises you were given until you feel like you can do them without feeling any tightness at the end.   Walking Program Studies show that 30 minutes of walking per day (fast enough to elevate your heart rate) can significantly reduce the risk of a cancer recurrence. If you can't walk due to other medical reasons, we encourage you to find another activity you could do (like a stationary bike or water exercise).  Posture After breast cancer surgery, people frequently sit with rounded shoulders posture because it puts their incisions on slack and feels better. If you sit like this and scar tissue forms in that position, you can become very tight and have pain sitting or standing with good posture. Try to be aware of your posture and sit and stand up tall to heal properly.  Follow up PT: It is recommended you return every 3 months for the first 3 years following surgery to be assessed on the SOZO machine for an L-Dex score. This helps prevent clinically significant lymphedema in 95% of patients. These  follow up screens are 10 minute appointments that you are not billed for.  Claris Pong, PT 12/14/2022, 12:16 PM

## 2022-12-14 ENCOUNTER — Other Ambulatory Visit: Payer: Self-pay

## 2022-12-14 ENCOUNTER — Ambulatory Visit: Payer: Medicare HMO | Attending: Surgery

## 2022-12-14 ENCOUNTER — Inpatient Hospital Stay: Payer: Medicare HMO

## 2022-12-14 ENCOUNTER — Encounter (HOSPITAL_COMMUNITY): Payer: Self-pay | Admitting: Surgery

## 2022-12-14 DIAGNOSIS — C50412 Malignant neoplasm of upper-outer quadrant of left female breast: Secondary | ICD-10-CM | POA: Diagnosis not present

## 2022-12-14 DIAGNOSIS — Z170 Estrogen receptor positive status [ER+]: Secondary | ICD-10-CM | POA: Diagnosis not present

## 2022-12-14 DIAGNOSIS — R293 Abnormal posture: Secondary | ICD-10-CM | POA: Diagnosis not present

## 2022-12-14 NOTE — Research (Signed)
S2205, ICE COMPRESS: RANDOMIZED TRIAL OF LIMB CRYOCOMPRESSION VERSUS CONTINUOUS COMPRESSION VERSUS LOW CYCLIC COMPRESSION FOR THE PREVENTION  OF TAXANE-INDUCED PERIPHERAL NEUROPATHY  This Nurse has reviewed this patient's inclusion and exclusion criteria as a second review and confirms Tonya Vincent is eligible for study participation.  Patient may continue with enrollment.  Foye Spurling, BSN, RN, [place] Nurse II [phone] 12/14/2022 1:27 PM

## 2022-12-14 NOTE — Research (Signed)
S2205, ICE COMPRESS: RANDOMIZED TRIAL OF LIMB CRYOCOMPRESSION VERSUS CONTINUOUS COMPRESSION VERSUS LOW CYCLIC COMPRESSION FOR THE PREVENTION OF TAXANE-INDUCED PERIPHERAL NEUROPATHY  CONSENT: [name] was identified by Dr Lindi Adie as a potential candidate for the above listed study. This Clinical Research Nurse met with [name], [id number] on 12/14/22 in a manner and location that ensures patient privacy to discuss participation in the above listed research study. Patient is Unaccompanied. Patient was previously provided with informed consent documents. Patient confirmed they have read the informed consent documents.  As outlined in the informed consent form, this Nurse and Gardenia Phlegm discussed the purpose of the research study, the investigational nature of the study, study procedures and requirements for study participation, potential risks and benefits of study participation, as well as alternatives to participation. This study is not blinded or double-blinded. The patient understands participation is voluntary and they may withdraw from study participation at any time. Each study arm was reviewed, and randomization discussed.  Potential side effects were reviewed with patient as outlined in the consent form, and patient made aware there may be side effects not yet known. This study does not involve a placebo. Patient understands enrollment is pending full eligibility review.   Confidentiality and how the patient's information will be used as part of study participation were discussed. Patient was informed there is not reimbursement provided for their time and effort spent on trial participation. The patient is encouraged to discuss research study participation with their insurance provider to determine what costs they may incur as part of study participation, including research related injury.    All questions were answered to patient's satisfaction. The informed consent  and separate HIPAA Authorization was reviewed page by page. The patient's mental and emotional status is appropriate to provide informed consent, and the patient verbalizes an understanding of study participation. Patient has agreed to participate in the above listed research study and has voluntarily signed the informed consent version date 05/30/22 [facility] Active Date 07/24/22) and separate HIPAA Authorization, version date 02/15/22 ([place] Active Date 11/14/22)  on 12/14/22 at 1240PM. The patient was provided with a copy of the signed informed consent form and separate HIPAA Authorization for their reference. No study specific procedures were obtained prior to the signing of the informed consent document. Approximately 20 minutes were spent with the patient reviewing the informed consent documents. Patient was not requested to complete a Release of Information form.  Patient also agreed to the optional specimen biobanking at time of informed consent.   Vickii Penna, RN, BSN, CPN Clinical Research Nurse I [phone]  12/14/2022 1:13 PM

## 2022-12-14 NOTE — Patient Instructions (Signed)
SHOULDER: Flexion - Supine (Cane)        [facility] Re[facility] [phone]    Hold cane in both hands. Raise arms up overhead. Do not allow back to arch. Hold _5__ seconds. Do __5__ times; __1-2__ times a day.  Hands shoulder width  Y position        [facility]  [address] [place] [address]  [phone]  After Breast Cancer Class It is recommended you attend the ABC class to be educated on lymphedema risk reduction. This class is free of charge and lasts for 1 hour. It is a 1-time class. You will need to download the Webex app either on your phone or computer. We will send you a link the night before or the morning of the class. You should be able to click on that link to join the class. This is not a confidential class. You don't have to turn your camera on, but other participants may be able to see your email address.  Scar massage You can begin gentle scar massage to you incision sites. Gently place one hand on the incision and move the skin (without sliding on the skin) in various directions. Do this for a few minutes and then you can gently massage either coconut oil or vitamin E cream into the scars.  Compression garment You should continue wearing your compression bra until you feel like you no longer have swelling.  Home exercise Program Continue doing the exercises you were given until you feel like you can do them without feeling any tightness at the end.   Walking Program Studies show that 30 minutes of walking per day (fast enough to elevate your heart rate) can significantly reduce the risk of a cancer recurrence. If you can't walk due to other medical reasons, we encourage you to find another activity you could do (like a stationary bike or water exercise).  Posture After breast cancer surgery, people frequently sit with rounded shoulders posture because it puts their incisions on slack and feels better. If you sit like this and scar  tissue forms in that position, you can become very tight and have pain sitting or standing with good posture. Try to be aware of your posture and sit and stand up tall to heal properly.  Follow up PT: It is recommended you return every 3 months for the first 2 years following surgery to be assessed on the SOZO machine for an L-Dex score. This helps prevent clinically significant lymphedema in 95% of patients. These follow up screens are 10 minute appointments that you are not billed for.

## 2022-12-14 NOTE — Research (Signed)
S2205, ICE COMPRESS: RANDOMIZED TRIAL OF LIMB CRYOCOMPRESSION VERSUS CONTINUOUS COMPRESSION VERSUS LOW CYCLIC COMPRESSION FOR THE PREVENTION OF TAXANE-INDUCED PERIPHERAL NEUROPATHY  ELIGIBILITY: This Nurse has reviewed this patient's inclusion and exclusion criteria and confirmed Tonya Vincent is eligible for study participation.   Confirmed the following with the patient today: -Patient has no history of skin or limb metastasis -Patient has not previously received neurotoxic chemotherapy. Patient received 5-Fluorouracil in 2011-2012 for colorectal cancer, which is not a known neurotoxin. -Patient has no pre-existing clinical peripheral neuropathy from any cause  -Patient has no history of Raynaud's, cold agglutinin disease, cryoglobulinemia, cryofibrinogenemia, post-traumatic cold dystrophy, or peripheral arterial ischemia -Patient has no open skin wounds or ulcers -Patient can complete PROs in English and agrees to complete them at all scheduled timepoints  Also inquired about patient's smoking history per the study's registration worksheet. Patient states she was a former smoker, but quit in 2010.   Patient will continue with enrollment.  Eligibility confirmed by treating investigator, who also agrees that patient should proceed with enrollment.  STRATIFICATION: Patient randomization will be stratified according to chemotherapy regimen (weekly paclitaxel x 12 vs weekly paclitaxel x 12 + carboplatin vs q3 weeks paclitaxel + carboplatin vs q3 weeks docetaxel + carboplatin).   Vickii Penna, RN, BSN, CPN Clinical Research Nurse I [phone]  12/14/2022 1:22 PM

## 2022-12-14 NOTE — Research (Signed)
S2205, ICE COMPRESS: RANDOMIZED TRIAL OF LIMB CRYOCOMPRESSION VERSUS CONTINUOUS COMPRESSION VERSUS LOW CYCLIC COMPRESSION FOR THE PREVENTION OF TAXANE-INDUCED PERIPHERAL NEUROPATHY  Patient presented to clinic unaccompanied this afternoon for consent + baseline assessments.  PROs: Per protocol, all baseline PROs were completed prior to other study activities. Completedness was verified by this clinical research nurse. Today's PROs fall within study-allowed window of within 28 days of registration (registration will occur between 1/22-1/25/24).  LABS: Mandatory and optional labs will be collected per consent and study protocol on C1D1 of patient's treatment, scheduled for 12/27/22.   CON MEDS: Current medication list reviewed and verified with the patient.  MED HISTORY: Medical history list reviewed and verified with the patient. Last weight recorded as 90.6kg on 12/06/22 and last height recorded as 1.616mon 11/14/22, which falls within study-allowed window of within 60 days of registration.   NEURO ASSESSMENTS: The neuropathy assessments were completed by this nurse and clinical [facility] completed timed get up & go, neuropen, and tuning fork assessments. Patient tolerated well without complaint.  PLAN: Patient's first treatment is scheduled for 12/27/22. Per protocol, patient will be registered and randomized within 3 calendar days of treatment initiation. Upon completion of all study requirements, patient was escorted to the clinic lobby. The patient was thanked for her time and continued voluntary participation in this research study. Patient has been provided direct contact info for both this nurse and clinical research nurse [facility] has been encouraged to contact the department for any questions or concerns.  LVickii Penna RN, BSN, CPN Clinical Research Nurse I [phone] 12/14/2022 1:31 PM

## 2022-12-14 NOTE — Progress Notes (Signed)
Ms Crutchfield denies chest pain or shortness of breath. Patient denies having any s/s of Covid in her household, also denies any known exposure to Covid.   Dr. Clayburn Pert.  Ms Crafts has type II diabetes. I instructed Ms Felipe to hold Vania Rea now, she took it earlyier today, I instructed patient to not take Ozempic Sunday.  I instructed patient to check CBG after awaking and every 2 hours until arrival  to the hospital. I Instructed patient if CBG is less than 70 to take 4 Glucose Tablets or 1 tube of Glucose Gel or 1/2 cup of a clear juice. Recheck CBG in 15 minutes if CBG is not over 70 call, pre- op desk at [phone] for further instructions.

## 2022-12-16 NOTE — Anesthesia Preprocedure Evaluation (Signed)
Anesthesia Evaluation  Patient identified by MRN, date of birth, ID band Patient awake    Reviewed: Allergy & Precautions, NPO status , Patient's Chart, lab work & pertinent test results  History of Anesthesia Complications Negative for: history of anesthetic complications  Airway Mallampati: I  TM Distance: >3 FB Neck ROM: Full    Dental  (+) Missing   Pulmonary former smoker   Pulmonary exam normal        Cardiovascular hypertension, Pt. on medications Normal cardiovascular exam     Neuro/Psych  PSYCHIATRIC DISORDERS Anxiety Depression       GI/Hepatic negative GI ROS,,,(+)     substance abuse    Endo/Other  diabetes (on Ozempic), Type 2, Oral Hypoglycemic Agents    Renal/GU negative Renal ROS     Musculoskeletal  (+) Arthritis ,    Abdominal  (+) + obese  Peds  Hematology negative hematology ROS (+)   Anesthesia Other Findings Left breast cancer  Reproductive/Obstetrics                             Anesthesia Physical Anesthesia Plan  ASA: 3  Anesthesia Plan: General   Post-op Pain Management: Tylenol PO (pre-op)*   Induction: Intravenous  PONV Risk Score and Plan: 3 and Treatment may vary due to age or medical condition, Dexamethasone and Ondansetron  Airway Management Planned: LMA  Additional Equipment:   Intra-op Plan:   Post-operative Plan: Extubation in OR  Informed Consent: I have reviewed the patients History and Physical, chart, labs and discussed the procedure including the risks, benefits and alternatives for the proposed anesthesia with the patient or authorized representative who has indicated his/her understanding and acceptance.     Dental advisory given  Plan Discussed with: CRNA  Anesthesia Plan Comments:         Anesthesia Quick Evaluation

## 2022-12-16 NOTE — H&P (Signed)
Tonya Vincent is an 72 y.o. female.   Chief Complaint: Breast cancer with plans for IV chemotherapy HPI: This is a 72 year old female recently diagnosed with left breast cancer status post left breast lumpectomy in December.  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  She has now had follow-up with medical oncology and a Port-A-Cath is been requested.  She has been doing well since surgery  Past Medical History:  Diagnosis Date   ADHD (attention deficit hyperactivity disorder)    Anemia    Anxiety    Arthritis    Breast cancer ([place]) 10/2022   left breast ILC   Complication of anesthesia    difficulty waking up   Depression    Diabetes mellitus    type 2 DM   Dyspnea    with exertion   Heart murmur    History of rectal cancer 10/04/2011   Hypertension    Overactive bladder    Paraspinal mass 12/29/2014   Rectal cancer ([place]) dx'd 11/16/09   xrt comp 01/2010; chemo comp 01/2011   Tubular adenoma of colon 01/08/2014    Past Surgical History:  Procedure Laterality Date   ABDOMINAL HYSTERECTOMY     for uterine fibroids   BREAST LUMPECTOMY WITH RADIOACTIVE SEED LOCALIZATION Left 11/14/2022   Procedure: LEFT BREAST RADIOACTIVE SEED GUIDED LUMPECTOMY x2;  Surgeon: Coralie Keens, MD;  Location: [facility];  Service: General;  Laterality: Left;   COLON SURGERY N/A 04/24/2010   "took out half rectum"; rectal cancer   EYE SURGERY     lasik surgery   HAND SURGERY     carpal tunnel- left   HEMORRHOID SURGERY N/A 1980   ORIF ANKLE FRACTURE Right 04/11/2020   Procedure: OPEN REDUCTION INTERNAL FIXATION (ORIF) ANKLE FRACTURE;  Surgeon: Carole Civil, MD;  Location: [facility];  Service: Orthopedics;  Laterality: Right;   PORT-A-CATH REMOVAL N/A 02/17/2019   Procedure: REMOVAL PORT-A-CATH;  Surgeon: Coralie Keens, MD;  Location: [facility];  Service: General;  Laterality: N/A;   PORTACATH PLACEMENT     2011   RECTAL  SURGERY     2010   SENTINEL NODE BIOPSY Left 11/14/2022   Procedure: SENTINEL LYMPH NODE BIOPSY;  Surgeon: Coralie Keens, MD;  Location: [facility];  Service: General;  Laterality: Left;   WISDOM TOOTH EXTRACTION      Family History  Problem Relation Age of Onset   Leukemia Father 47   Breast cancer Maternal Aunt        dx > 41; mother's maternal half sister   Kidney cancer Maternal Uncle        dx > 61   Breast cancer Paternal Aunt        dx > 38   Breast cancer Cousin        dx > 74; paternal female cousin   Mesothelioma Cousin        paternal female cousin   Colon cancer Neg Hx    Esophageal cancer Neg Hx    Stomach cancer Neg Hx    Rectal cancer Neg Hx    Social History:  reports that she quit smoking about 13 years ago. Her smoking use included cigarettes and cigars. She has a 17.50 pack-year smoking history. She has never used smokeless tobacco. She reports current alcohol use of about 7.0 standard drinks of alcohol per week. She reports that she does not use drugs.  Allergies:  Allergies  Allergen Reactions   Advanced Hand Sanitizer [Alcohol] Dermatitis   Norgesic [Orphenadrine-Aspirin-Caffeine] Hives   Hydrocodone Hives   Oxycodone Hives   Antibacterial Hand Soap [Triclosan] Dermatitis    No medications prior to admission.    No results found for this or any previous visit (from the past 48 hour(s)). No results found.  Review of Systems  All other systems reviewed and are negative.   There were no vitals taken for this visit. Physical Exam Constitutional:      Appearance: Normal appearance.  HENT:     Head: Normocephalic and atraumatic.     Nose: Nose normal.  Cardiovascular:     Rate and Rhythm: Normal rate.  Pulmonary:     Effort: Pulmonary effort is normal.     Breath sounds: Normal breath sounds.  Abdominal:     General: Abdomen is flat.     Tenderness: There is no abdominal tenderness.  Skin:    General: Skin is warm and  dry.  Neurological:     Mental Status: She is alert.      Assessment/Plan Invasive lobular breast cancer of the left breast and need for IV chemotherapy and Herceptin  We will proceed to the operating room for a Port-A-Cath insertion.  I explained the procedure to her in detail.  We discussed the risks which includes but is not limited to bleeding, infection, injury to surrounding structures, port malfunction, pneumothorax, the need for other procedures, etc. she understands post previous surgery which is scheduled  Coralie Keens, MD 12/16/2022, 9:08 AM

## 2022-12-17 ENCOUNTER — Ambulatory Visit (HOSPITAL_COMMUNITY): Payer: Medicare HMO | Admitting: Certified Registered Nurse Anesthetist

## 2022-12-17 ENCOUNTER — Encounter (HOSPITAL_COMMUNITY): Payer: Self-pay | Admitting: Surgery

## 2022-12-17 ENCOUNTER — Encounter (HOSPITAL_COMMUNITY)
Admission: RE | Disposition: A | Discharge status: Home / Self Care | Payer: Self-pay | Source: Home / Self Care | Attending: Surgery

## 2022-12-17 ENCOUNTER — Other Ambulatory Visit: Payer: Self-pay

## 2022-12-17 ENCOUNTER — Ambulatory Visit (HOSPITAL_COMMUNITY)
Admission: RE | Admit: 2022-12-17 | Discharge: 2022-12-17 | Disposition: A | Discharge status: Home / Self Care | Payer: Medicare HMO | Attending: Surgery | Admitting: Surgery

## 2022-12-17 ENCOUNTER — Ambulatory Visit (HOSPITAL_BASED_OUTPATIENT_CLINIC_OR_DEPARTMENT_OTHER): Payer: Medicare HMO | Admitting: Certified Registered Nurse Anesthetist

## 2022-12-17 ENCOUNTER — Ambulatory Visit (HOSPITAL_COMMUNITY): Payer: Medicare HMO

## 2022-12-17 DIAGNOSIS — Z87891 Personal history of nicotine dependence: Secondary | ICD-10-CM | POA: Diagnosis not present

## 2022-12-17 DIAGNOSIS — C50912 Malignant neoplasm of unspecified site of left female breast: Secondary | ICD-10-CM | POA: Diagnosis not present

## 2022-12-17 DIAGNOSIS — I10 Essential (primary) hypertension: Secondary | ICD-10-CM | POA: Diagnosis not present

## 2022-12-17 DIAGNOSIS — Z5111 Encounter for antineoplastic chemotherapy: Secondary | ICD-10-CM | POA: Diagnosis not present

## 2022-12-17 DIAGNOSIS — F418 Other specified anxiety disorders: Secondary | ICD-10-CM

## 2022-12-17 DIAGNOSIS — C50919 Malignant neoplasm of unspecified site of unspecified female breast: Secondary | ICD-10-CM | POA: Diagnosis not present

## 2022-12-17 DIAGNOSIS — R69 Illness, unspecified: Secondary | ICD-10-CM | POA: Diagnosis not present

## 2022-12-17 HISTORY — DX: Attention-deficit hyperactivity disorder, unspecified type: F90.9

## 2022-12-17 HISTORY — PX: PORTACATH PLACEMENT: SHX2246

## 2022-12-17 LAB — GLUCOSE, CAPILLARY
Glucose-Capillary: 145 mg/dL — ABNORMAL HIGH (ref 70–99)
Glucose-Capillary: 151 mg/dL — ABNORMAL HIGH (ref 70–99)

## 2022-12-17 LAB — COMPREHENSIVE METABOLIC PANEL
ALT: 19 U/L (ref 0–44)
AST: 18 U/L (ref 15–41)
Albumin: 3.6 g/dL (ref 3.5–5.0)
Alkaline Phosphatase: 81 U/L (ref 38–126)
Anion gap: 9 (ref 5–15)
BUN: 10 mg/dL (ref 8–23)
CO2: 22 mmol/L (ref 22–32)
Calcium: 8.6 mg/dL — ABNORMAL LOW (ref 8.9–10.3)
Chloride: 104 mmol/L (ref 98–111)
Creatinine, Ser: 1.03 mg/dL — ABNORMAL HIGH (ref 0.44–1.00)
GFR, Estimated: 58 mL/min — ABNORMAL LOW (ref >60)
Glucose, Bld: 140 mg/dL — ABNORMAL HIGH (ref 70–99)
Potassium: 3.8 mmol/L (ref 3.5–5.1)
Sodium: 135 mmol/L (ref 135–145)
Total Bilirubin: 0.3 mg/dL (ref 0.3–1.2)
Total Protein: 6.8 g/dL (ref 6.5–8.1)

## 2022-12-17 SURGERY — INSERTION, TUNNELED CENTRAL VENOUS DEVICE, WITH PORT
Anesthesia: General | Site: Chest

## 2022-12-17 MED ORDER — PHENYLEPHRINE 80 MCG/ML (10ML) SYRINGE FOR IV PUSH (FOR BLOOD PRESSURE SUPPORT)
PREFILLED_SYRINGE | INTRAVENOUS | Status: DC | PRN
  Administered 2022-12-17 (×2): 80 ug via INTRAVENOUS

## 2022-12-17 MED ORDER — INSULIN ASPART 100 UNIT/ML IJ SOLN: 0.0000 [IU] | INTRAMUSCULAR | Status: DC | PRN

## 2022-12-17 MED ORDER — LACTATED RINGERS IV SOLN: INTRAVENOUS | Status: DC

## 2022-12-17 MED ORDER — BUPIVACAINE HCL (PF) 0.25 % IJ SOLN
INTRAMUSCULAR | Status: AC
  Filled 2022-12-17: qty 30

## 2022-12-17 MED ORDER — DEXAMETHASONE SODIUM PHOSPHATE 10 MG/ML IJ SOLN
INTRAMUSCULAR | Status: DC | PRN
  Administered 2022-12-17: 4 mg via INTRAVENOUS

## 2022-12-17 MED ORDER — ACETAMINOPHEN 500 MG PO TABS
1000.0000 mg | ORAL_TABLET | ORAL | Status: AC
  Administered 2022-12-17: 1000 mg via ORAL
  Filled 2022-12-17: qty 2

## 2022-12-17 MED ORDER — EPHEDRINE SULFATE-NACL 50-0.9 MG/10ML-% IV SOSY
PREFILLED_SYRINGE | INTRAVENOUS | Status: DC | PRN
  Administered 2022-12-17 (×2): 2.5 mg via INTRAVENOUS

## 2022-12-17 MED ORDER — FENTANYL CITRATE (PF) 250 MCG/5ML IJ SOLN
INTRAMUSCULAR | Status: AC
  Filled 2022-12-17: qty 5

## 2022-12-17 MED ORDER — PROPOFOL 10 MG/ML IV BOLUS
INTRAVENOUS | Status: DC | PRN
  Administered 2022-12-17: 200 mg via INTRAVENOUS

## 2022-12-17 MED ORDER — LIDOCAINE 2% (20 MG/ML) 5 ML SYRINGE
INTRAMUSCULAR | Status: DC | PRN
  Administered 2022-12-17: 60 mg via INTRAVENOUS

## 2022-12-17 MED ORDER — ONDANSETRON HCL 4 MG/2ML IJ SOLN
INTRAMUSCULAR | Status: DC | PRN
  Administered 2022-12-17: 4 mg via INTRAVENOUS

## 2022-12-17 MED ORDER — ORAL CARE MOUTH RINSE
15.0000 mL | Freq: Once | OROMUCOSAL | Status: AC
  Administered 2022-12-17: 15 mL via OROMUCOSAL

## 2022-12-17 MED ORDER — CHLORHEXIDINE GLUCONATE 0.12 % MT SOLN: 15.0000 mL | Freq: Once | OROMUCOSAL | Status: AC

## 2022-12-17 MED ORDER — FENTANYL CITRATE (PF) 100 MCG/2ML IJ SOLN: 25.0000 ug | INTRAMUSCULAR | Status: DC | PRN

## 2022-12-17 MED ORDER — PROPOFOL 10 MG/ML IV BOLUS
INTRAVENOUS | Status: AC
  Filled 2022-12-17: qty 20

## 2022-12-17 MED ORDER — FENTANYL CITRATE (PF) 250 MCG/5ML IJ SOLN
INTRAMUSCULAR | Status: DC | PRN
  Administered 2022-12-17 (×4): 25 ug via INTRAVENOUS
  Administered 2022-12-17: 50 ug via INTRAVENOUS

## 2022-12-17 MED ORDER — BUPIVACAINE HCL (PF) 0.25 % IJ SOLN
INTRAMUSCULAR | Status: DC | PRN
  Administered 2022-12-17: 6 mL

## 2022-12-17 MED ORDER — HEPARIN SOD (PORK) LOCK FLUSH 100 UNIT/ML IV SOLN
INTRAVENOUS | Status: AC
  Filled 2022-12-17: qty 5

## 2022-12-17 MED ORDER — AMISULPRIDE (ANTIEMETIC) 5 MG/2ML IV SOLN: 10.0000 mg | Freq: Once | INTRAVENOUS | Status: DC | PRN

## 2022-12-17 MED ORDER — HEPARIN 6000 UNIT IRRIGATION SOLUTION
Status: AC
  Filled 2022-12-17: qty 500

## 2022-12-17 MED ORDER — LIDOCAINE HCL (PF) 1 % IJ SOLN
INTRAMUSCULAR | Status: AC
  Filled 2022-12-17: qty 30

## 2022-12-17 MED ORDER — MIDAZOLAM HCL 2 MG/2ML IJ SOLN
INTRAMUSCULAR | Status: DC | PRN
  Administered 2022-12-17: 1 mg via INTRAVENOUS

## 2022-12-17 MED ORDER — DEXAMETHASONE SODIUM PHOSPHATE 10 MG/ML IJ SOLN
INTRAMUSCULAR | Status: AC
  Filled 2022-12-17: qty 1

## 2022-12-17 MED ORDER — ACETAMINOPHEN-CODEINE 300-30 MG PO TABS: 1.0000 | ORAL_TABLET | Freq: Four times a day (QID) | ORAL | 0 refills | Status: DC | PRN

## 2022-12-17 MED ORDER — ONDANSETRON HCL 4 MG/2ML IJ SOLN: 4.0000 mg | Freq: Once | INTRAMUSCULAR | Status: DC | PRN

## 2022-12-17 MED ORDER — HEPARIN 6000 UNIT IRRIGATION SOLUTION
Status: DC | PRN
  Administered 2022-12-17: 1

## 2022-12-17 MED ORDER — MIDAZOLAM HCL 2 MG/2ML IJ SOLN
INTRAMUSCULAR | Status: AC
  Filled 2022-12-17: qty 2

## 2022-12-17 MED ORDER — CHLORHEXIDINE GLUCONATE CLOTH 2 % EX PADS: 6.0000 | MEDICATED_PAD | Freq: Once | CUTANEOUS | Status: DC

## 2022-12-17 MED ORDER — CEFAZOLIN SODIUM-DEXTROSE 2-4 GM/100ML-% IV SOLN
2.0000 g | INTRAVENOUS | Status: AC
  Administered 2022-12-17: 2 g via INTRAVENOUS
  Filled 2022-12-17: qty 100

## 2022-12-17 MED ORDER — EPINEPHRINE 1 MG/10ML IJ SOSY
PREFILLED_SYRINGE | INTRAMUSCULAR | Status: AC
  Filled 2022-12-17: qty 10

## 2022-12-17 MED ORDER — LIDOCAINE 2% (20 MG/ML) 5 ML SYRINGE
INTRAMUSCULAR | Status: AC
  Filled 2022-12-17: qty 5

## 2022-12-17 MED ORDER — PHENYLEPHRINE 80 MCG/ML (10ML) SYRINGE FOR IV PUSH (FOR BLOOD PRESSURE SUPPORT)
PREFILLED_SYRINGE | INTRAVENOUS | Status: AC
  Filled 2022-12-17: qty 10

## 2022-12-17 MED ORDER — EPHEDRINE 5 MG/ML INJ
INTRAVENOUS | Status: AC
  Filled 2022-12-17: qty 5

## 2022-12-17 MED ORDER — ACETAMINOPHEN 500 MG PO TABS: 1000.0000 mg | ORAL_TABLET | Freq: Once | ORAL | Status: DC

## 2022-12-17 MED ORDER — ONDANSETRON HCL 4 MG/2ML IJ SOLN
INTRAMUSCULAR | Status: AC
  Filled 2022-12-17: qty 2

## 2022-12-17 MED ORDER — HEPARIN SOD (PORK) LOCK FLUSH 100 UNIT/ML IV SOLN
INTRAVENOUS | Status: DC | PRN
  Administered 2022-12-17: 500 [IU]

## 2022-12-17 SURGICAL SUPPLY — 39 items
ADH SKN CLS APL DERMABOND .7 (GAUZE/BANDAGES/DRESSINGS) ×1
APL PRP STRL LF DISP 70% ISPRP (MISCELLANEOUS) ×1
BAG DECANTER FOR FLEXI CONT (MISCELLANEOUS) ×1 IMPLANT
CHLORAPREP W/TINT 26 (MISCELLANEOUS) ×1 IMPLANT
COVER SURGICAL LIGHT HANDLE (MISCELLANEOUS) ×1 IMPLANT
COVER TRANSDUCER ULTRASND GEL (DISPOSABLE) IMPLANT
DERMABOND ADVANCED .7 DNX12 (GAUZE/BANDAGES/DRESSINGS) ×1 IMPLANT
DRAPE C-ARM 42X120 X-RAY (DRAPES) ×1 IMPLANT
DRAPE CHEST BREAST 15X10 FENES (DRAPES) ×1 IMPLANT
DRSG TEGADERM 4X4.75 (GAUZE/BANDAGES/DRESSINGS) ×1 IMPLANT
ELECT CAUTERY BLADE 6.4 (BLADE) ×1 IMPLANT
ELECT REM PT RETURN 9FT ADLT (ELECTROSURGICAL) ×1
ELECTRODE REM PT RTRN 9FT ADLT (ELECTROSURGICAL) ×1 IMPLANT
GAUZE 4X4 16PLY ~~LOC~~+RFID DBL (SPONGE) ×1 IMPLANT
GAUZE SPONGE 2X2 8PLY STRL LF (GAUZE/BANDAGES/DRESSINGS) ×1 IMPLANT
GLOVE SURG SIGNA 7.5 PF LTX (GLOVE) ×1 IMPLANT
GOWN STRL REUS W/ TWL LRG LVL3 (GOWN DISPOSABLE) ×1 IMPLANT
GOWN STRL REUS W/ TWL XL LVL3 (GOWN DISPOSABLE) ×1 IMPLANT
GOWN STRL REUS W/TWL LRG LVL3 (GOWN DISPOSABLE) ×1
GOWN STRL REUS W/TWL XL LVL3 (GOWN DISPOSABLE) ×1
KIT BASIN OR (CUSTOM PROCEDURE TRAY) ×1 IMPLANT
KIT PORT POWER 8FR ISP CVUE (Port) IMPLANT
KIT TURNOVER KIT B (KITS) ×1 IMPLANT
NS IRRIG 1000ML POUR BTL (IV SOLUTION) ×1 IMPLANT
PAD ARMBOARD 7.5X6 YLW CONV (MISCELLANEOUS) ×1 IMPLANT
PENCIL BUTTON HOLSTER BLD 10FT (ELECTRODE) ×1 IMPLANT
POSITIONER HEAD DONUT 9IN (MISCELLANEOUS) ×1 IMPLANT
SET SHEATH INTRODUCER 10FR (MISCELLANEOUS) IMPLANT
SHEATH COOK PEEL AWAY SET 9F (SHEATH) IMPLANT
SUT MNCRL AB 4-0 PS2 18 (SUTURE) ×1 IMPLANT
SUT PROLENE 2 0 SH 30 (SUTURE) ×1 IMPLANT
SUT SILK 2 0 (SUTURE) ×1
SUT SILK 2-0 18XBRD TIE 12 (SUTURE) IMPLANT
SUT VIC AB 3-0 SH 27 (SUTURE) ×1
SUT VIC AB 3-0 SH 27XBRD (SUTURE) ×1 IMPLANT
SYR 5ML LUER SLIP (SYRINGE) ×1 IMPLANT
TOWEL GREEN STERILE (TOWEL DISPOSABLE) ×1 IMPLANT
TOWEL GREEN STERILE FF (TOWEL DISPOSABLE) ×1 IMPLANT
TRAY LAPAROSCOPIC MC (CUSTOM PROCEDURE TRAY) ×1 IMPLANT

## 2022-12-17 NOTE — Anesthesia Procedure Notes (Signed)
Procedure Name: LMA Insertion Date/Time: 12/17/2022 7:35 AM  Performed by: Janene Harvey, CRNAPre-anesthesia Checklist: Patient identified, Emergency Drugs available, Suction available and Patient being monitored Patient Re-evaluated:Patient Re-evaluated prior to induction Oxygen Delivery Method: Circle system utilized Preoxygenation: Pre-oxygenation with 100% oxygen Induction Type: IV induction LMA: LMA inserted LMA Size: 4.0 Placement Confirmation: positive ETCO2 Tube secured with: Tape Dental Injury: Teeth and Oropharynx as per pre-operative assessment

## 2022-12-17 NOTE — Op Note (Signed)
   Tonya Vincent 12/17/2022   Pre-op Diagnosis: BREAST CANCER, NEED FOR IV CHEMOTHERAPY     Post-op Diagnosis: SAME  Procedure(s): INSERTION PORT-A-CATH RIGHT INTERNAL JUGULAR VEIN UNDER ULTRASOUND GUIDANCE  Surgeon(s): Coralie Keens, MD  Anesthesia: General  Staff:  Circulator: Lindwood Coke, RN; Hal Morales, RN Radiology Technologist: Ammie Dalton, RT Scrub Person: Barton Fanny E  Estimated Blood Loss: Minimal  Indications: This is a 72 year old female recently diagnosed with a left breast cancer status postlumpectomy.  She has seen medical oncology and IV chemotherapy as well as Herceptin has been recommended therefore Port-A-Cath has been recommended.  Procedure: The patient was brought to the operating room and identified as a correct patient.  She was placed upon the operating table and general anesthesia was induced.  Her right neck and chest were then prepped and draped in usual sterile fashion.  The patient was placed in the Trendelenburg position.  Under ultrasound guidance identified the right internal jugular vein and then was able to cannulate the vein with the introducer needle.  This took multiple attempts as a small hematoma developed.  I was then able to pass a wire through the needle into the central venous system which was confirmed under fluoroscopy.  I anesthetized the skin on the right upper chest under the clavicle with Marcaine and anesthetized the neck as well going toward the incision on the neck.  I made a transverse incision on the upper chest with a scalpel and then dissected down to the chest wall and created a pocket for the port with the cautery.  An 8 French [place] port was brought to the field.  It was flushed appropriately.  Using the tunneling device I passed the tunneler from the chest incision out the neck incision.  Before doing this I passed the venous dilator introducer sheath over the wire and removed the dilator and wire.  I then  placed the catheter on the tunneler and pulled it back to the chest incision.  The port was attached to the catheter.  I then cut the catheter appropriate length and fed down the peel-away sheath into the right internal jugular vein.  The sheath was peeled away leaving the catheter in the central venous system.  Under fluoroscopy it was confirmed to be in the superior vena cava.  I accessed the port with heparinized saline and good flush and return were demonstrated.  I sutured the port to the chest wall 2 separate 3-0 Prolene sutures.  I then closed the subcutaneous tissue of both incisions with 3-0 Vicryl sutures and 4-0 Monocryl sutures.  I accessed the port again and good flush and return were demonstrated instilled it with concentrated heparin solution.  Dermabond was then applied to the incisions.  The patient tolerated the procedure well.  All the counts were correct at the end of the procedure.  The patient was then extubated in the operating room and taken in stable condition to the recovery room.          Coralie Keens   Date: 12/17/2022  Time: 8:19 AM

## 2022-12-17 NOTE — Discharge Instructions (Signed)
Ok to shower starting tomorrow  Ice pack also for pain  No vigorous activity for one week

## 2022-12-17 NOTE — Anesthesia Postprocedure Evaluation (Signed)
Anesthesia Post Note  Patient: Tonya Vincent  Procedure(s) Performed: INSERTION PORT-A-CATH (Chest)     Patient location during evaluation: PACU Anesthesia Type: General Level of consciousness: awake Pain management: pain level controlled Vital Signs Assessment: post-procedure vital signs reviewed and stable Respiratory status: spontaneous breathing, nonlabored ventilation and respiratory function stable Cardiovascular status: blood pressure returned to baseline and stable Postop Assessment: no apparent nausea or vomiting Anesthetic complications: no   No notable events documented.  Last Vitals:  Vitals:   12/17/22 0830 12/17/22 0845  BP: 134/77 (!) 154/81  Pulse: 69 75  Resp: (!) 9 14  Temp:  (!) 36.2 C  SpO2: 94% 97%    Last Pain:  Vitals:   12/17/22 0845  TempSrc:   PainSc: 0-No pain                 Nalini Alcaraz P [place]

## 2022-12-17 NOTE — Transfer of Care (Signed)
Immediate Anesthesia Transfer of Care Note  Patient: Tonya Vincent  Procedure(s) Performed: INSERTION PORT-A-CATH (Chest)  Patient Location: PACU  Anesthesia Type:General  Level of Consciousness: drowsy and patient cooperative  Airway & Oxygen Therapy: Patient Spontanous Breathing and Patient connected to face mask oxygen  Post-op Assessment: Report given to RN and Post -op Vital signs reviewed and stable  Post vital signs: Reviewed and stable  Last Vitals:  Vitals Value Taken Time  BP 119/89 12/17/22 0824  Temp    Pulse 71 12/17/22 0828  Resp 15 12/17/22 0828  SpO2 96 % 12/17/22 0828  Vitals shown include unvalidated device data.  Last Pain:  Vitals:   12/17/22 0615  TempSrc: Oral  PainSc: 0-No pain         Complications: No notable events documented.

## 2022-12-17 NOTE — Interval H&P Note (Signed)
History and Physical Interval Note:no change in H and P  12/17/2022 6:48 AM  Tonya Vincent  has presented today for surgery, with the diagnosis of BREAST CANCER.  The various methods of treatment have been discussed with the patient and family. After consideration of risks, benefits and other options for treatment, the patient has consented to  Procedure(s): INSERTION PORT-A-CATH (N/A) as a surgical intervention.  The patient's history has been reviewed, patient examined, no change in status, stable for surgery.  I have reviewed the patient's chart and labs.  Questions were answered to the patient's satisfaction.     Coralie Keens

## 2022-12-18 ENCOUNTER — Encounter (HOSPITAL_COMMUNITY): Payer: Self-pay | Admitting: Surgery

## 2022-12-19 ENCOUNTER — Other Ambulatory Visit: Payer: Self-pay

## 2022-12-19 ENCOUNTER — Ambulatory Visit: Payer: Medicare HMO | Admitting: Physical Therapy

## 2022-12-20 ENCOUNTER — Inpatient Hospital Stay: Payer: Medicare HMO

## 2022-12-20 ENCOUNTER — Encounter (HOSPITAL_COMMUNITY): Payer: Self-pay

## 2022-12-20 ENCOUNTER — Inpatient Hospital Stay: Payer: Medicare HMO | Admitting: Pharmacist

## 2022-12-20 ENCOUNTER — Other Ambulatory Visit: Payer: Self-pay

## 2022-12-20 VITALS — BP 151/83 | HR 83 | Temp 98.1°F | Resp 18 | Ht 64.0 in | Wt 198.6 lb

## 2022-12-20 DIAGNOSIS — Z923 Personal history of irradiation: Secondary | ICD-10-CM | POA: Diagnosis not present

## 2022-12-20 DIAGNOSIS — C50512 Malignant neoplasm of lower-outer quadrant of left female breast: Secondary | ICD-10-CM | POA: Diagnosis not present

## 2022-12-20 DIAGNOSIS — Z5111 Encounter for antineoplastic chemotherapy: Secondary | ICD-10-CM | POA: Diagnosis not present

## 2022-12-20 DIAGNOSIS — Z79899 Other long term (current) drug therapy: Secondary | ICD-10-CM | POA: Diagnosis not present

## 2022-12-20 DIAGNOSIS — Z170 Estrogen receptor positive status [ER+]: Secondary | ICD-10-CM | POA: Diagnosis not present

## 2022-12-20 DIAGNOSIS — C50412 Malignant neoplasm of upper-outer quadrant of left female breast: Secondary | ICD-10-CM

## 2022-12-20 NOTE — Progress Notes (Signed)
Tonya Vincent       Telephone: [phone]?Fax: [phone]   Oncology Clinical Pharmacist Practitioner Initial Assessment  Tonya Vincent is a 72 y.o. female with a diagnosis of breast cancer. They were contacted today via in-person visit.  Indication/Regimen TCH: Trastuzumab (Herceptin) and docetaxel (Taxotere) and carboplatin (Paraplatin) are being used appropriately for treatment of breast cancer by Dr. Nicholas Vincent.      Wt Readings from Last 1 Encounters:  12/20/22 198 lb 9.6 oz (90.1 kg)    Estimated body surface area is 2.02 meters squared as calculated from the following:   Height as of this encounter: '5\' 4"'$  (1.626 m).   Weight as of this encounter: 198 lb 9.6 oz (90.1 kg).  The dosing regimen is every 21 days for 6 cycles  Trastuzumab (8 mg/kg load, 6 mg/kg maintenance) on Day 1 Docetaxel (75 mg/m2) on Day 1 Carboplatin (AUC 6) on Day 1  Dose Modifications Dr. Lindi Vincent is starting at a dose of 65 mg/m2 for docetaxel   Allergies Allergies  Allergen Reactions   Advanced Hand Sanitizer [Alcohol] Dermatitis   Norgesic [Orphenadrine-Aspirin-Caffeine] Hives   Hydrocodone Hives   Oxycodone Hives   Antibacterial Hand Soap [Triclosan] Dermatitis    Vitals    12/20/2022    2:08 PM 12/17/2022    8:45 AM 12/17/2022    8:30 AM  Oncology Vitals  Height 163 cm    Weight 90.084 kg    Weight (lbs) 198 lbs 10 oz    BMI 34.09 kg/m2   34.09 kg/m2    Temp 98.1 F (36.7 C) 97.2 F (36.2 C)   Pulse Rate 83 75 69  BP 151/83 154/81 134/77  Resp '18 14 9  '$ SpO2 98 % 97 % 94 %  BSA (m2) 2.02 m2   2.02 m2       Laboratory Data = no labs were done today for this education visit  Contraindications Contraindications were reviewed? Yes Contraindications to therapy were identified? No   Safety Precautions (written information also provided) The following safety precautions for the use of Tonya Vincent were reviewed:  Decreased hemoglobin, part of the red blood  cells that carry iron and oxygen Decreased platelet count and increased risk of bleeding Decreased white blood cells (WBCs) and increased risk for infection Fever: reviewed the importance of having a thermometer and the Centers for Disease Control and Prevention (CDC) definition of fever which is 100.85F (38C) or higher. Patient should call 24/7 triage at [phone] if experiencing a fever or any other symptoms Hair Loss Muscle or joint pain or weakness Fatigue Nausea or vomiting Mouth sores Diarrhea Irregular menses (if applicable) Peripheral neuropathy: numbness or tingling in hands and feet Fluid retention or swelling (edema) Nail Changes Rash or itchy skin Fluid retention or swelling (edema) Taste changes Changes in kidney function Changes in electrolytes and other laboratory values (low potassium, low magnesium) Cardiotoxicity from trastuzumab Infusion reactions Pneumonitis from trastuzumab Docetaxel irritating veins Liver toxicity from docetaxel Docetaxel can cause eye pain, blurred vision, tearing, and light sensitivity Handling body fluids and waste Intimacy, sexual activity, contraception, fertility  Medication Reconciliation Current Outpatient Medications  Medication Sig Dispense Refill   amLODipine (NORVASC) 10 MG tablet Take 10 mg by mouth at bedtime.      Aromatic Inhalants (VICKS VAPOINHALER IN) Inhale 1 puff into the lungs daily as needed (congestion).     aspirin EC 81 MG tablet Take 81 mg by mouth at bedtime.  atorvastatin (LIPITOR) 10 MG tablet Take 10 mg by mouth daily.     empagliflozin (JARDIANCE) 25 MG TABS tablet Take 25 mg by mouth daily with supper.      latanoprost (XALATAN) 0.005 % ophthalmic solution Place 1 drop into both eyes at bedtime.      olmesartan (BENICAR) 40 MG tablet Take 40 mg by mouth daily.     Semaglutide, 1 MG/DOSE, 2 MG/1.5ML SOPN Inject 1 mg into the skin every Sunday.     spironolactone (ALDACTONE) 25 MG tablet Take 50 mg  by mouth daily.     vortioxetine HBr (TRINTELLIX) 10 MG TABS tablet Take 10 mg by mouth daily with supper.     acetaminophen-codeine (TYLENOL #3) 300-30 MG tablet Take 1 tablet by mouth every 6 (six) hours as needed for moderate pain. (Patient not taking: Reported on 12/20/2022) 25 tablet 0   dexamethasone (DECADRON) 4 MG tablet Take 1 tablet (4 mg total) by mouth daily. Take 1 tablet day before chemo and 1 tablet day after chemo with food (Patient not taking: Reported on 12/20/2022) 8 tablet 0   docusate sodium (COLACE) 100 MG capsule Take 100-400 mg by mouth daily as needed for mild constipation. (Patient not taking: Reported on 12/20/2022)     ibuprofen (ADVIL) 800 MG tablet Take 800 mg by mouth every 8 (eight) hours as needed for moderate pain. (Patient not taking: Reported on 12/20/2022)     lidocaine-prilocaine (EMLA) cream Apply to affected area once (Patient not taking: Reported on 12/20/2022) 30 g 3   ondansetron (ZOFRAN) 8 MG tablet Take 1 tablet (8 mg total) by mouth every 8 (eight) hours as needed for nausea or vomiting. Start on the third day after chemotherapy. (Patient not taking: Reported on 12/20/2022) 30 tablet 1   prochlorperazine (COMPAZINE) 10 MG tablet Take 1 tablet (10 mg total) by mouth every 6 (six) hours as needed for nausea or vomiting. (Patient not taking: Reported on 12/20/2022) 30 tablet 1   No current facility-administered medications for this visit.    Medication reconciliation is based on the patient's most recent medication list in the electronic medical record (EMR) including herbal products and OTC medications.   The patient's medication list was reviewed today with the patient? Yes   Drug-drug interactions (DDIs) DDIs were evaluated? Yes Significant DDIs identified? No   Drug-Food Interactions Drug-food interactions were evaluated? Yes Drug-food interactions identified? No   Follow-up Plan  Treatment start date: 12/27/22 Port placement date: 12/17/22 ECHO date:  12/10/22 Reviewed prescriptions, premedications, chemotherapy, and pegfilgrastim sequencing and potential side effects Clinical pharmacy will assist Dr. Nicholas Vincent and Tonya Vincent on an as needed basis going forward  Tonya Vincent participated in the discussion, expressed understanding, and voiced agreement with the above plan. All questions were answered to her satisfaction. The patient was advised to contact the clinic at [phone] with any questions or concerns prior to her return visit.   I spent 60 minutes assessing the patient.  Raina Mina, RPH-CPP, 12/20/2022 2:52 PM  **Disclaimer: This note was dictated with voice recognition software. Similar sounding words can inadvertently be transcribed and this note may contain transcription errors which may not have been corrected upon publication of note.**

## 2022-12-25 DIAGNOSIS — C50412 Malignant neoplasm of upper-outer quadrant of left female breast: Secondary | ICD-10-CM

## 2022-12-25 NOTE — Research (Signed)
S2205, ICE COMPRESS: RANDOMIZED TRIAL OF LIMB CRYOCOMPRESSION VERSUS CONTINUOUS COMPRESSION VERSUS LOW CYCLIC COMPRESSION FOR THE PREVENTION  OF TAXANE-INDUCED PERIPHERAL NEUROPATHY  Updated eligibility confirmation:  This Nurse has reviewed this patient's inclusion and exclusion criteria and confirmed Tonya Vincent is eligible for study participation.  Patient will continue with enrollment.  Menopausal status (women only): Tonya Vincent is post-hysterectomy.  Eligibility confirmed by treating investigator, who also agrees that patient should proceed with enrollment.  Marjie Skiff Ritaj Dullea, RN, BSN, [facility]  [facility]  [facility] Clinical Research Nurse [facility] Direct Dial [phone]  Pager [phone] 12/25/2022 12:19 PM

## 2022-12-25 NOTE — Research (Signed)
S2205, ICE COMPRESS: RANDOMIZED TRIAL OF LIMB CRYOCOMPRESSION VERSUS CONTINUOUS COMPRESSION VERSUS LOW CYCLIC COMPRESSION FOR THE PREVENTION  OF TAXANE-INDUCED PERIPHERAL NEUROPATHY  This Nurse has reviewed this patient's inclusion and exclusion criteria as a second review and confirms Tonya Vincent is eligible for study participation.  Patient may continue with enrollment.  Foye Spurling, BSN, RN, [place] Nurse II [phone] 12/25/2022 12:56 PM

## 2022-12-26 ENCOUNTER — Other Ambulatory Visit: Payer: Self-pay

## 2022-12-26 DIAGNOSIS — Z170 Estrogen receptor positive status [ER+]: Secondary | ICD-10-CM

## 2022-12-26 MED FILL — Fosaprepitant Dimeglumine For IV Infusion 150 MG (Base Eq): INTRAVENOUS | Qty: 5 | Status: AC

## 2022-12-26 NOTE — Assessment & Plan Note (Signed)
10/17/2022 Diffuse bilateral breast pains.  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

## 2022-12-27 ENCOUNTER — Inpatient Hospital Stay: Payer: Medicare HMO

## 2022-12-27 ENCOUNTER — Encounter: Payer: Self-pay | Admitting: *Deleted

## 2022-12-27 ENCOUNTER — Encounter: Payer: Self-pay | Admitting: Hematology and Oncology

## 2022-12-27 ENCOUNTER — Other Ambulatory Visit: Payer: Self-pay

## 2022-12-27 ENCOUNTER — Inpatient Hospital Stay (HOSPITAL_BASED_OUTPATIENT_CLINIC_OR_DEPARTMENT_OTHER): Payer: Medicare HMO | Admitting: Hematology and Oncology

## 2022-12-27 VITALS — BP 154/96 | HR 81 | Temp 97.5°F | Resp 16 | Wt 198.4 lb

## 2022-12-27 VITALS — BP 142/78 | HR 78 | Temp 98.2°F | Resp 17

## 2022-12-27 DIAGNOSIS — Z923 Personal history of irradiation: Secondary | ICD-10-CM | POA: Diagnosis not present

## 2022-12-27 DIAGNOSIS — Z170 Estrogen receptor positive status [ER+]: Secondary | ICD-10-CM

## 2022-12-27 DIAGNOSIS — C50512 Malignant neoplasm of lower-outer quadrant of left female breast: Secondary | ICD-10-CM | POA: Diagnosis not present

## 2022-12-27 DIAGNOSIS — C20 Malignant neoplasm of rectum: Secondary | ICD-10-CM

## 2022-12-27 DIAGNOSIS — C50412 Malignant neoplasm of upper-outer quadrant of left female breast: Secondary | ICD-10-CM | POA: Diagnosis not present

## 2022-12-27 DIAGNOSIS — Z95828 Presence of other vascular implants and grafts: Secondary | ICD-10-CM

## 2022-12-27 DIAGNOSIS — Z79899 Other long term (current) drug therapy: Secondary | ICD-10-CM | POA: Diagnosis not present

## 2022-12-27 DIAGNOSIS — Z5111 Encounter for antineoplastic chemotherapy: Secondary | ICD-10-CM | POA: Diagnosis not present

## 2022-12-27 HISTORY — DX: Presence of other vascular implants and grafts: Z95.828

## 2022-12-27 LAB — CBC WITH DIFFERENTIAL (CANCER CENTER ONLY)
Abs Immature Granulocytes: 0.04 10*3/uL (ref 0.00–0.07)
Basophils Absolute: 0 10*3/uL (ref 0.0–0.1)
Basophils Relative: 0 %
Eosinophils Absolute: 0 10*3/uL (ref 0.0–0.5)
Eosinophils Relative: 0 %
HCT: 38.2 % (ref 36.0–46.0)
Hemoglobin: 12.7 g/dL (ref 12.0–15.0)
Immature Granulocytes: 0 %
Lymphocytes Relative: 14 %
Lymphs Abs: 1.3 10*3/uL (ref 0.7–4.0)
MCH: 27.1 pg (ref 26.0–34.0)
MCHC: 33.2 g/dL (ref 30.0–36.0)
MCV: 81.4 fL (ref 80.0–100.0)
Monocytes Absolute: 0.8 10*3/uL (ref 0.1–1.0)
Monocytes Relative: 9 %
Neutro Abs: 6.8 10*3/uL (ref 1.7–7.7)
Neutrophils Relative %: 77 %
Platelet Count: 299 10*3/uL (ref 150–400)
RBC: 4.69 MIL/uL (ref 3.87–5.11)
RDW: 15.8 % — ABNORMAL HIGH (ref 11.5–15.5)
WBC Count: 9 10*3/uL (ref 4.0–10.5)
nRBC: 0 % (ref 0.0–0.2)

## 2022-12-27 LAB — CMP (CANCER CENTER ONLY)
ALT: 16 U/L (ref 0–44)
AST: 12 U/L — ABNORMAL LOW (ref 15–41)
Albumin: 4.1 g/dL (ref 3.5–5.0)
Alkaline Phosphatase: 99 U/L (ref 38–126)
Anion gap: 8 (ref 5–15)
BUN: 21 mg/dL (ref 8–23)
CO2: 26 mmol/L (ref 22–32)
Calcium: 9.5 mg/dL (ref 8.9–10.3)
Chloride: 102 mmol/L (ref 98–111)
Creatinine: 1.24 mg/dL — ABNORMAL HIGH (ref 0.44–1.00)
GFR, Estimated: 47 mL/min — ABNORMAL LOW (ref >60)
Glucose, Bld: 176 mg/dL — ABNORMAL HIGH (ref 70–99)
Potassium: 3.9 mmol/L (ref 3.5–5.1)
Sodium: 136 mmol/L (ref 135–145)
Total Bilirubin: 0.4 mg/dL (ref 0.3–1.2)
Total Protein: 7.7 g/dL (ref 6.5–8.1)

## 2022-12-27 LAB — RESEARCH LABS

## 2022-12-27 MED ORDER — SODIUM CHLORIDE 0.9 % IV SOLN: Freq: Once | INTRAVENOUS | Status: AC

## 2022-12-27 MED ORDER — DEXAMETHASONE SODIUM PHOSPHATE 10 MG/ML IJ SOLN
4.0000 mg | Freq: Once | INTRAMUSCULAR | Status: AC
  Administered 2022-12-27: 4 mg via INTRAVENOUS
  Filled 2022-12-27: qty 1

## 2022-12-27 MED ORDER — SODIUM CHLORIDE 0.9 % IJ SOLN: 10.0000 mL | INTRAMUSCULAR | Status: DC | PRN

## 2022-12-27 MED ORDER — SODIUM CHLORIDE 0.9 % IV SOLN
65.0000 mg/m2 | Freq: Once | INTRAVENOUS | Status: AC
  Administered 2022-12-27: 130 mg via INTRAVENOUS
  Filled 2022-12-27: qty 13

## 2022-12-27 MED ORDER — ACETAMINOPHEN 325 MG PO TABS
650.0000 mg | ORAL_TABLET | Freq: Once | ORAL | Status: AC
  Administered 2022-12-27: 650 mg via ORAL
  Filled 2022-12-27: qty 2

## 2022-12-27 MED ORDER — DIPHENHYDRAMINE HCL 25 MG PO CAPS
50.0000 mg | ORAL_CAPSULE | Freq: Once | ORAL | Status: AC
  Administered 2022-12-27: 50 mg via ORAL
  Filled 2022-12-27: qty 2

## 2022-12-27 MED ORDER — HEPARIN SOD (PORK) LOCK FLUSH 100 UNIT/ML IV SOLN: 500.0000 [IU] | Freq: Once | INTRAVENOUS | Status: DC | PRN

## 2022-12-27 MED ORDER — PALONOSETRON HCL INJECTION 0.25 MG/5ML
0.2500 mg | Freq: Once | INTRAVENOUS | Status: AC
  Administered 2022-12-27: 0.25 mg via INTRAVENOUS
  Filled 2022-12-27: qty 5

## 2022-12-27 MED ORDER — SODIUM CHLORIDE 0.9 % IV SOLN
500.0000 mg | Freq: Once | INTRAVENOUS | Status: AC
  Administered 2022-12-27: 500 mg via INTRAVENOUS
  Filled 2022-12-27: qty 50

## 2022-12-27 MED ORDER — SODIUM CHLORIDE 0.9% FLUSH: 10.0000 mL | INTRAVENOUS | Status: DC | PRN

## 2022-12-27 MED ORDER — TRASTUZUMAB-ANNS CHEMO 150 MG IV SOLR
8.0000 mg/kg | Freq: Once | INTRAVENOUS | Status: AC
  Administered 2022-12-27: 735 mg via INTRAVENOUS
  Filled 2022-12-27: qty 35

## 2022-12-27 MED ORDER — SODIUM CHLORIDE 0.9 % IV SOLN: 4.0000 mg | Freq: Once | INTRAVENOUS | Status: DC

## 2022-12-27 MED ORDER — SODIUM CHLORIDE 0.9% FLUSH
10.0000 mL | Freq: Once | INTRAVENOUS | Status: AC
  Administered 2022-12-27: 10 mL via INTRAVENOUS

## 2022-12-27 MED ORDER — SODIUM CHLORIDE 0.9 % IV SOLN
150.0000 mg | Freq: Once | INTRAVENOUS | Status: AC
  Administered 2022-12-27: 150 mg via INTRAVENOUS
  Filled 2022-12-27: qty 150

## 2022-12-27 NOTE — Research (Addendum)
TRIAL S2205, ICE COMPRESS: RANDOMIZED TRIAL OF LIMB CRYOCOMPRESSION VERSUS CONTINUOUS COMPRESSION VERSUS LOW CYCLIC COMPRESSION FOR THE PREVENTION  OF TAXANE-INDUCED PERIPHERAL NEUROPATHY   Patient arrives today Unaccompanied for the C1D1/Week 1 visit. Confirmed no sores/lesions to extremities. Patient understands treatment assignment (low cyclic compression). All questions answered.   1223 Started pre-treatment 1306 Started taxane & study intervention 1311 Device tolerable 1316 Device tolerable 1333 Device tolerable 1354 Restroom break started 1405 Resumed treatment after break 1436 Taxane done; post-treatment started 1508 Device treatment complete, pt disconnected; no sores or lesions   PROs: Per study protocol, all PROs required for this visit were completed prior to other study activities and completeness has been verified.  These were done on 12/14/22.    LABS: Optional labs are collected per consent and study protocol: [name] tolerated well without complaint.   MEDICATION REVIEW: Patient reviews and verifies the current medication list is correct.  VITAL SIGNS: Vital signs are collected per study protocol.  MD/PROVIDER VISIT: Patient sees Dr Lindi Adie for today's visit.   ADVERSE EVENTS: [name] reports no AEs; this is first treatment. No s/s neuropathy.  EKG ASSESSMENT: EKG assessment is not required for this visit.  GIFT CARD: This study does not provide visit compensation.   DISPOSITION: Upon completion off all study requirements, patient remained in infusion room to complete treatment. Confirmed she is willing to continue on study at next infusion.   The patient was thanked for their time and continued voluntary participation in this study. [name] has been provided direct contact information and is encouraged to contact this Nurse for any needs or questions.  Marjie Skiff Gaudencio Chesnut, RN, BSN, [facility] She  [facility]  [facility] Clinical  Research Nurse [place] [phone]  Pager [phone]

## 2022-12-27 NOTE — Patient Instructions (Signed)
[facility]  Discharge Instructions: Thank you for choosing [facility] to provide your oncology and hematology care.   If you have a lab appointment with the [facility], please go directly to the [facility] and check in at the registration area.   Wear comfortable clothing and clothing appropriate for easy access to any Portacath or PICC line.   We strive to give you quality time with your provider. You may need to reschedule your appointment if you arrive late (15 or more minutes).  Arriving late affects you and other patients whose appointments are after yours.  Also, if you miss three or more appointments without notifying the office, you may be dismissed from the clinic at the provider's discretion.      For prescription refill requests, have your pharmacy contact our office and allow 72 hours for refills to be completed.    Today you received the following chemotherapy and/or immunotherapy agents Trastuzumab, Taxotere, Carboplatin     To help prevent nausea and vomiting after your treatment, we encourage you to take your nausea medication as directed.  BELOW ARE SYMPTOMS THAT SHOULD BE REPORTED IMMEDIATELY: *FEVER GREATER THAN 100.4 F (38 C) OR HIGHER *CHILLS OR SWEATING *NAUSEA AND VOMITING THAT IS NOT CONTROLLED WITH YOUR NAUSEA MEDICATION *UNUSUAL SHORTNESS OF BREATH *UNUSUAL BRUISING OR BLEEDING *URINARY PROBLEMS (pain or burning when urinating, or frequent urination) *BOWEL PROBLEMS (unusual diarrhea, constipation, pain near the anus) TENDERNESS IN MOUTH AND THROAT WITH OR WITHOUT PRESENCE OF ULCERS (sore throat, sores in mouth, or a toothache) UNUSUAL RASH, SWELLING OR PAIN  UNUSUAL VAGINAL DISCHARGE OR ITCHING   Items with * indicate a potential emergency and should be followed up as soon as possible or go to the Emergency Department if any problems should occur.  Please show the CHEMOTHERAPY ALERT CARD or  IMMUNOTHERAPY ALERT CARD at check-in to the Emergency Department and triage nurse.  Should you have questions after your visit or need to cancel or reschedule your appointment, please contact [facility]  Dept: [phone]  and follow the prompts.  Office hours are 8:00 a.m. to 4:30 p.m. Monday - Friday. Please note that voicemails left after 4:00 p.m. may not be returned until the following business day.  We are closed weekends and major holidays. You have access to a nurse at all times for urgent questions. Please call the main number to the clinic Dept: [phone] and follow the prompts.   For any non-urgent questions, you may also contact your provider using MyChart. We now offer e-Visits for anyone 48 and older to request care online for non-urgent symptoms. For details visit [email or web address].   Also download the MyChart app! Go to the app store, search "MyChart", open the app, select [facility], and log in with your MyChart username and password.   Trastuzumab Injection What is this medication? TRASTUZUMAB (tras TOO zoo mab) treats breast cancer and stomach cancer. It works by blocking a protein that causes cancer cells to grow and multiply. This helps to slow or stop the spread of cancer cells. This medicine may be used for other purposes; ask your health care provider or pharmacist if you have questions. COMMON BRAND NAME(S): Herceptin, Janae Bridgeman, Ontruzant, Trazimera What should I tell my care team before I take this medication? They need to know if you have any of these conditions: Heart failure Lung disease An unusual or allergic reaction to trastuzumab,  other medications, foods, dyes, or preservatives Pregnant or trying to get pregnant Breast-feeding How should I use this medication? This medication is injected into a vein. It is given by your care team in a hospital or clinic setting. Talk to your care team about the  use of this medication in children. It is not approved for use in children. Overdosage: If you think you have taken too much of this medicine contact a poison control center or emergency room at once. NOTE: This medicine is only for you. Do not share this medicine with others. What if I miss a dose? Keep appointments for follow-up doses. It is important not to miss your dose. Call your care team if you are unable to keep an appointment. What may interact with this medication? Certain types of chemotherapy, such as daunorubicin, doxorubicin, epirubicin, idarubicin This list may not describe all possible interactions. Give your health care provider a list of all the medicines, herbs, non-prescription drugs, or dietary supplements you use. Also tell them if you smoke, drink alcohol, or use illegal drugs. Some items may interact with your medicine. What should I watch for while using this medication? Your condition will be monitored carefully while you are receiving this medication. This medication may make you feel generally unwell. This is not uncommon, as chemotherapy affects healthy cells as well as cancer cells. Report any side effects. Continue your course of treatment even though you feel ill unless your care team tells you to stop. This medication may increase your risk of getting an infection. Call your care team for advice if you get a fever, chills, sore throat, or other symptoms of a cold or flu. Do not treat yourself. Try to avoid being around people who are sick. Avoid taking medications that contain aspirin, acetaminophen, ibuprofen, naproxen, or ketoprofen unless instructed by your care team. These medications can hide a fever. Talk to your care team if you may be pregnant. Serious birth defects can occur if you take this medication during pregnancy and for 7 months after the last dose. You will need a negative pregnancy test before starting this medication. Contraception is recommended  while taking this medication and for 7 months after the last dose. Your care team can help you find the option that works for you. Do not breastfeed while taking this medication and for 7 months after stopping treatment. What side effects may I notice from receiving this medication? Side effects that you should report to your care team as soon as possible: Allergic reactions or angioedema--skin rash, itching or hives, swelling of the face, eyes, lips, tongue, arms, or legs, trouble swallowing or breathing Dry cough, shortness of breath or trouble breathing Heart failure--shortness of breath, swelling of the ankles, feet, or hands, sudden weight gain, unusual weakness or fatigue Infection--fever, chills, cough, or sore throat Infusion reactions--chest pain, shortness of breath or trouble breathing, feeling faint or lightheaded Side effects that usually do not require medical attention (report to your care team if they continue or are bothersome): Diarrhea Dizziness Headache Nausea Trouble sleeping Vomiting This list may not describe all possible side effects. Call your doctor for medical advice about side effects. You may report side effects to FDA at 1-800-FDA-1088. Where should I keep my medication? This medication is given in a hospital or clinic. It will not be stored at home. NOTE: This sheet is a summary. It may not cover all possible information. If you have questions about this medicine, talk to your doctor, pharmacist, or  health care provider.  2023 Elsevier/Gold Standard (2022-03-22 00:00:00)  Docetaxel Injection What is this medication? DOCETAXEL (doe se TAX el) treats some types of cancer. It works by slowing down the growth of cancer cells. This medicine may be used for other purposes; ask your health care provider or pharmacist if you have questions. COMMON BRAND NAME(S): Docefrez, Taxotere What should I tell my care team before I take this medication? They need to know if  you have any of these conditions: Kidney disease Liver disease Low white blood cell levels Tingling of the fingers or toes or other nerve disorder An unusual or allergic reaction to docetaxel, polysorbate 80, other medications, foods, dyes, or preservatives Pregnant or trying to get pregnant Breast-feeding How should I use this medication? This medication is injected into a vein. It is given by your care team in a hospital or clinic setting. Talk to your care team about the use of this medication in children. Special care may be needed. Overdosage: If you think you have taken too much of this medicine contact a poison control center or emergency room at once. NOTE: This medicine is only for you. Do not share this medicine with others. What if I miss a dose? Keep appointments for follow-up doses. It is important not to miss your dose. Call your care team if you are unable to keep an appointment. What may interact with this medication? Do not take this medication with any of the following: Live virus vaccines This medication may also interact with the following: Certain antibiotics, such as clarithromycin, telithromycin Certain antivirals for HIV or hepatitis Certain medications for fungal infections, such as itraconazole, ketoconazole, voriconazole Grapefruit juice Nefazodone Supplements, such as St. John's wort This list may not describe all possible interactions. Give your health care provider a list of all the medicines, herbs, non-prescription drugs, or dietary supplements you use. Also tell them if you smoke, drink alcohol, or use illegal drugs. Some items may interact with your medicine. What should I watch for while using this medication? This medication may make you feel generally unwell. This is not uncommon as chemotherapy can affect healthy cells as well as cancer cells. Report any side effects. Continue your course of treatment even though you feel ill unless your care team  tells you to stop. You may need blood work done while you are taking this medication. This medication can cause serious side effects and infusion reactions. To reduce the risk, your care team may give you other medications to take before receiving this one. Be sure to follow the directions from your care team. This medication may increase your risk of getting an infection. Call your care team for advice if you get a fever, chills, sore throat, or other symptoms of a cold or flu. Do not treat yourself. Try to avoid being around people who are sick. Avoid taking medications that contain aspirin, acetaminophen, ibuprofen, naproxen, or ketoprofen unless instructed by your care team. These medications may hide a fever. Be careful brushing or flossing your teeth or using a toothpick because you may get an infection or bleed more easily. If you have any dental work done, tell your dentist you are receiving this medication. Some products may contain alcohol. Ask your care team if this medication contains alcohol. Be sure to tell all care teams you are taking this medicine. Certain medications, like metronidazole and disulfiram, can cause an unpleasant reaction when taken with alcohol. The reaction includes flushing, headache, nausea, vomiting, sweating, and increased  thirst. The reaction can last from 30 minutes to several hours. This medication may affect your coordination, reaction time, or judgement. Do not drive or operate machinery until you know how this medication affects you. Sit up or stand slowly to reduce the risk of dizzy or fainting spells. Drinking alcohol with this medication can increase the risk of these side effects. Talk to your care team about your risk of cancer. You may be more at risk for certain types of cancer if you take this medication. Talk to your care team if you wish to become pregnant or think you might be pregnant. This medication can cause serious birth defects if taken during  pregnancy or if you get pregnant within 2 months after stopping therapy. A negative pregnancy test is required before starting this medication. A reliable form of contraception is recommended while taking this medication and for 2 months after stopping it. Talk to your care team about reliable forms of contraception. Do not breast-feed while taking this medication and for 1 week after stopping therapy. Use a condom during sex and for 4 months after stopping therapy. Tell your care team right away if you think your partner might be pregnant. This medication can cause serious birth defects. This medication may cause infertility. Talk to your care team if you are concerned about your fertility. What side effects may I notice from receiving this medication? Side effects that you should report to your care team as soon as possible: Allergic reactions--skin rash, itching, hives, swelling of the face, lips, tongue, or throat Change in vision such as blurry vision, seeing halos around lights, vision loss Infection--fever, chills, cough, or sore throat Infusion reactions--chest pain, shortness of breath or trouble breathing, feeling faint or lightheaded Low red blood cell level--unusual weakness or fatigue, dizziness, headache, trouble breathing Pain, tingling, or numbness in the hands or feet Painful swelling, warmth, or redness of the skin, blisters or sores at the infusion site Redness, blistering, peeling, or loosening of the skin, including inside the mouth Sudden or severe stomach pain, bloody diarrhea, fever, nausea, vomiting Swelling of the ankles, hands, or feet Tumor lysis syndrome (TLS)--nausea, vomiting, diarrhea, decrease in the amount of urine, dark urine, unusual weakness or fatigue, confusion, muscle pain or cramps, fast or irregular heartbeat, joint pain Unusual bruising or bleeding Side effects that usually do not require medical attention (report to your care team if they continue or are  bothersome): Change in nail shape, thickness, or color Change in taste Hair loss Increased tears This list may not describe all possible side effects. Call your doctor for medical advice about side effects. You may report side effects to FDA at 1-800-FDA-1088. Where should I keep my medication? This medication is given in a hospital or clinic. It will not be stored at home. NOTE: This sheet is a summary. It may not cover all possible information. If you have questions about this medicine, talk to your doctor, pharmacist, or health care provider.  2023 Elsevier/Gold Standard (2008-01-10 00:00:00)  Carboplatin Injection What is this medication? CARBOPLATIN (KAR boe pla tin) treats some types of cancer. It works by slowing down the growth of cancer cells. This medicine may be used for other purposes; ask your health care provider or pharmacist if you have questions. COMMON BRAND NAME(S): Paraplatin What should I tell my care team before I take this medication? They need to know if you have any of these conditions: Blood disorders Hearing problems Kidney disease Recent or ongoing radiation therapy  An unusual or allergic reaction to carboplatin, cisplatin, other medications, foods, dyes, or preservatives Pregnant or trying to get pregnant Breast-feeding How should I use this medication? This medication is injected into a vein. It is given by your care team in a hospital or clinic setting. Talk to your care team about the use of this medication in children. Special care may be needed. Overdosage: If you think you have taken too much of this medicine contact a poison control center or emergency room at once. NOTE: This medicine is only for you. Do not share this medicine with others. What if I miss a dose? Keep appointments for follow-up doses. It is important not to miss your dose. Call your care team if you are unable to keep an appointment. What may interact with this  medication? Medications for seizures Some antibiotics, such as amikacin, gentamicin, neomycin, streptomycin, tobramycin Vaccines This list may not describe all possible interactions. Give your health care provider a list of all the medicines, herbs, non-prescription drugs, or dietary supplements you use. Also tell them if you smoke, drink alcohol, or use illegal drugs. Some items may interact with your medicine. What should I watch for while using this medication? Your condition will be monitored carefully while you are receiving this medication. You may need blood work while taking this medication. This medication may make you feel generally unwell. This is not uncommon, as chemotherapy can affect healthy cells as well as cancer cells. Report any side effects. Continue your course of treatment even though you feel ill unless your care team tells you to stop. In some cases, you may be given additional medications to help with side effects. Follow all directions for their use. This medication may increase your risk of getting an infection. Call your care team for advice if you get a fever, chills, sore throat, or other symptoms of a cold or flu. Do not treat yourself. Try to avoid being around people who are sick. Avoid taking medications that contain aspirin, acetaminophen, ibuprofen, naproxen, or ketoprofen unless instructed by your care team. These medications may hide a fever. Be careful brushing or flossing your teeth or using a toothpick because you may get an infection or bleed more easily. If you have any dental work done, tell your dentist you are receiving this medication. Talk to your care team if you wish to become pregnant or think you might be pregnant. This medication can cause serious birth defects. Talk to your care team about effective forms of contraception. Do not breast-feed while taking this medication. What side effects may I notice from receiving this medication? Side effects  that you should report to your care team as soon as possible: Allergic reactions--skin rash, itching, hives, swelling of the face, lips, tongue, or throat Infection--fever, chills, cough, sore throat, wounds that don't heal, pain or trouble when passing urine, general feeling of discomfort or being unwell Low red blood cell level--unusual weakness or fatigue, dizziness, headache, trouble breathing Pain, tingling, or numbness in the hands or feet, muscle weakness, change in vision, confusion or trouble speaking, loss of balance or coordination, trouble walking, seizures Unusual bruising or bleeding Side effects that usually do not require medical attention (report to your care team if they continue or are bothersome): Hair loss Nausea Unusual weakness or fatigue Vomiting This list may not describe all possible side effects. Call your doctor for medical advice about side effects. You may report side effects to FDA at 1-800-FDA-1088. Where should I keep my  medication? This medication is given in a hospital or clinic. It will not be stored at home. NOTE: This sheet is a summary. It may not cover all possible information. If you have questions about this medicine, talk to your doctor, pharmacist, or health care provider.  2023 Elsevier/Gold Standard (2022-03-05 00:00:00)

## 2022-12-27 NOTE — Progress Notes (Signed)
Patient Care Team: [name], MD as PCP - General (Family Medicine) [name], MD as Consulting Physician (Hematology and Oncology) Rockwell Germany, RN as Oncology Nurse Navigator Mauro Kaufmann, RN as Oncology Nurse Navigator Nicholas Lose, MD as Consulting Physician (Hematology and Oncology) Coralie Keens, MD as Consulting Physician (General Surgery) Gery Pray, MD as Consulting Physician (Radiation Oncology)  DIAGNOSIS:  Encounter Diagnoses  Name Primary?   Malignant neoplasm of upper-outer quadrant of left breast in female, estrogen receptor positive ([place]) Yes   Malignant neoplasm of lower-outer quadrant of left breast of female, estrogen receptor positive ([place])     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 after neoadjuvant chemotherapy and radiation therapy   Primary site: Colon and Rectum   Staging method: AJCC 7th Edition   Clinical: Stage I (T2, N0, M0) signed by [name], MD on 12/28/2013  1:49 PM   Pathologic: Stage I (T2, N0, cM0) signed by [name], MD on 12/28/2013  1:49 PM   Summary: Stage I (T2, N0, cM0)     History of rectal cancer  11/17/2009 Procedure   Colonoscopy and rectal biopsy confirmed moderately differentiated adenocarcinoma   11/18/2009 Imaging   Transrectal ultrasound place staging T3 lesion   11/18/2009 Imaging   Staging CT scan of the chest, abdomen and pelvis showed multiple lesions in the liver as well as paraspinal mass of unknown etiology   12/05/2009 - 04/03/2010 Chemotherapy   The patient completed new adjuvant chemotherapy with 5-FU and radiation therapy   12/14/2009 Imaging   PET CT scan show faint metabolic activity in the paraspinal mass with no activity in the liver   04/24/2010 Surgery   She had surgical resection with negative margins. Final pathology was T2, N0, M0 (down staged by chemoradiation therapy from T3, N0, M0)   11/19/2012  Imaging   MRI of the liver confirmed benign hemangioma   01/08/2014 Procedure   Colonoscopy and biopsy was negative   12/21/2014 Imaging   CT scan showed no recurrence of colon cancer. Incidentally, paraspinal mass is slightly larger.   10/04/2016 Procedure   She had repeat colonoscopy which showed two 3 mm polyps in the transverse colon, removed with a cold snare. Resected and retrieved. The examination was otherwise normal on direct and retroflexion views.   10/04/2016 Pathology Results   Surgical [P], transverse, polyp(s) - SERRATED POLYP WITH FOCAL FEATURES SUGGESTIVE OF SESSILE SERRATED POLYP/ADENOMA, ONE FRAGMENT. - HYPERPLASTIC POLYP WITHOUT DYSPLASIA, ONE FRAGMENT. - BENIGN COLORECTAL MUCOSA WITHOUT DYSPLASIA, ONE FRAGMENT. - SEE COMMENT. Microscopic Comment After routine specimen processing, three fragments are identified. One of the fragments demonstrates a serrated horizontally situated crypt, which is suggestive of superficial sampling of a sessile serrated polyp/adenoma (the differential includes another fragment of hyperplastic polyp). Although this is the case, the finding is very focal and not definitive.    11/09/2022 Genetic Testing   Negative genetics for [facility] Multi-Cancer +RNA Panel.  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 Report date is 11/09/2022.   The Multi-Cancer + RNA Panel offered by [facility] includes sequencing and/or deletion/duplication analysis of the following 70 genes:  AIP*, ALK, APC*, ATM*, AXIN2*, BAP1*, BARD1*, BLM*, BMPR1A*, BRCA1*, BRCA2*, BRIP1*, CDC73*, CDH1*, CDK4, CDKN1B*, CDKN2A, CHEK2*, CTNNA1*, DICER1*, EPCAM (del/dup only), EGFR, FH*, FLCN*, GREM1 (promoter dup only), HOXB13, KIT, LZTR1, MAX*, MBD4, MEN1*, MET, MITF, MLH1*, MSH2*, MSH3*, MSH6*, MUTYH*, NF1*, NF2*, NTHL1*, PALB2*, PDGFRA, PMS2*, POLD1*, POLE*,  POT1*, PRKAR1A*, PTCH1*, PTEN*, RAD51C*, RAD51D*, RB1*, RET, SDHA* (sequencing only), SDHAF2*, SDHB*, SDHC*, SDHD*,  SMAD4*, SMARCA4*, SMARCB1*, SMARCE1*, STK11*, SUFU*, TMEM127*, TP53*, TSC1*, TSC2*, VHL*. RNA analysis is performed for * genes.    11/09/2022 Genetic Testing   Negative genetics for [facility] Multi-Cancer +RNA Panel.  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 Report date is 11/09/2022.   The Multi-Cancer + RNA Panel offered by [facility] includes sequencing and/or deletion/duplication analysis of the following 70 genes:  AIP*, ALK, APC*, ATM*, AXIN2*, BAP1*, BARD1*, BLM*, BMPR1A*, BRCA1*, BRCA2*, BRIP1*, CDC73*, CDH1*, CDK4, CDKN1B*, CDKN2A, CHEK2*, CTNNA1*, DICER1*, EPCAM (del/dup only), EGFR, FH*, FLCN*, GREM1 (promoter dup only), HOXB13, KIT, LZTR1, MAX*, MBD4, MEN1*, MET, MITF, MLH1*, MSH2*, MSH3*, MSH6*, MUTYH*, NF1*, NF2*, NTHL1*, PALB2*, PDGFRA, PMS2*, POLD1*, POLE*, POT1*, PRKAR1A*, PTCH1*, PTEN*, RAD51C*, RAD51D*, RB1*, RET, SDHA* (sequencing only), SDHAF2*, SDHB*, SDHC*, SDHD*, SMAD4*, SMARCA4*, SMARCB1*, SMARCE1*, STK11*, SUFU*, TMEM127*, TP53*, TSC1*, TSC2*, VHL*. RNA analysis is performed for * genes.    Malignant neoplasm of upper-outer quadrant of left breast in female, estrogen receptor positive ([place])  10/17/2022 Initial Diagnosis   Diffuse bilateral breast pains.  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  Variant of uncertain significance in KIT at  c.839C>T (p.Ala280Val). Report date is 11/09/2022.   The Multi-Cancer + RNA Panel offered by [facility] includes sequencing and/or deletion/duplication analysis of the following 70 genes:  AIP*, ALK, APC*, ATM*, AXIN2*, BAP1*, BARD1*, BLM*, BMPR1A*, BRCA1*, BRCA2*, BRIP1*, CDC73*, CDH1*, CDK4, CDKN1B*, CDKN2A, CHEK2*, CTNNA1*, DICER1*, EPCAM (del/dup only), EGFR, FH*, FLCN*, GREM1 (promoter dup only), HOXB13, KIT, LZTR1, MAX*, MBD4, MEN1*, MET, MITF, MLH1*, MSH2*, MSH3*, MSH6*, MUTYH*, NF1*, NF2*, NTHL1*, PALB2*, PDGFRA, PMS2*, POLD1*, POLE*, POT1*, PRKAR1A*, PTCH1*, PTEN*, RAD51C*, RAD51D*, RB1*, RET, SDHA* (sequencing only), SDHAF2*, SDHB*, SDHC*, SDHD*, SMAD4*, SMARCA4*, SMARCB1*, SMARCE1*, STK11*, SUFU*, TMEM127*, TP53*, TSC1*, TSC2*, VHL*. RNA analysis is performed for * genes.    11/14/2022 Surgery   Left lumpectomy: Grade 2 ILC with LCIS 3.5 cm, 1/8 lymph node with micrometastases ER 100%, PR 40%, Ki-67 20%, HER2 3+ positive; lymph node prognostic panel: ER 100% PR 20% Ki-67 10%, HER2 2+ by IHC, FISH negative ratio 1.14   Malignant neoplasm of lower-outer quadrant of left breast of female, estrogen receptor positive ([place])  10/30/2022 Initial Diagnosis   Malignant neoplasm of lower-outer quadrant of left breast of female, estrogen receptor positive ([place])   12/27/2022 -  Chemotherapy   Patient is on Treatment Plan : BREAST Docetaxel + Carboplatin + Trastuzumab ([place]) q21d / Trastuzumab q21d       CHIEF COMPLIANT: Cycle 1 TCH  INTERVAL HISTORY: Tonya Vincent is a 72 y.o. female is here because of recent diagnosis of left breast cancer. She states that she is prepared for treatment today. She reports no concerns or issues today to the clinic today.   ALLERGIES:  is allergic to advanced hand sanitizer [  alcohol], norgesic [orphenadrine-aspirin-caffeine], hydrocodone, oxycodone, and antibacterial hand soap [triclosan].  MEDICATIONS:  Current Outpatient Medications  Medication Sig Dispense  Refill   amLODipine (NORVASC) 10 MG tablet Take 10 mg by mouth at bedtime.      Aromatic Inhalants (VICKS VAPOINHALER IN) Inhale 1 puff into the lungs daily as needed (congestion).     aspirin EC 81 MG tablet Take 81 mg by mouth at bedtime.      atorvastatin (LIPITOR) 10 MG tablet Take 10 mg by mouth daily.     acetaminophen-codeine (TYLENOL #3) 300-30 MG tablet Take 1 tablet by mouth every 6 (six) hours as needed for moderate pain. (Patient not taking: Reported on 12/20/2022) 25 tablet 0   dexamethasone (DECADRON) 4 MG tablet Take 1 tablet (4 mg total) by mouth daily. Take 1 tablet day before chemo and 1 tablet day after chemo with food (Patient not taking: Reported on 12/20/2022) 8 tablet 0   docusate sodium (COLACE) 100 MG capsule Take 100-400 mg by mouth daily as needed for mild constipation. (Patient not taking: Reported on 12/20/2022)     empagliflozin (JARDIANCE) 25 MG TABS tablet Take 25 mg by mouth daily with supper.      ibuprofen (ADVIL) 800 MG tablet Take 800 mg by mouth every 8 (eight) hours as needed for moderate pain. (Patient not taking: Reported on 12/20/2022)     latanoprost (XALATAN) 0.005 % ophthalmic solution Place 1 drop into both eyes at bedtime.      lidocaine-prilocaine (EMLA) cream Apply to affected area once (Patient not taking: Reported on 12/20/2022) 30 g 3   olmesartan (BENICAR) 40 MG tablet Take 40 mg by mouth daily.     ondansetron (ZOFRAN) 8 MG tablet Take 1 tablet (8 mg total) by mouth every 8 (eight) hours as needed for nausea or vomiting. Start on the third day after chemotherapy. (Patient not taking: Reported on 12/20/2022) 30 tablet 1   prochlorperazine (COMPAZINE) 10 MG tablet Take 1 tablet (10 mg total) by mouth every 6 (six) hours as needed for nausea or vomiting. (Patient not taking: Reported on 12/20/2022) 30 tablet 1   Semaglutide, 1 MG/DOSE, 2 MG/1.5ML SOPN Inject 1 mg into the skin every Sunday.     spironolactone (ALDACTONE) 25 MG tablet Take 50 mg by mouth  daily.     vortioxetine HBr (TRINTELLIX) 10 MG TABS tablet Take 10 mg by mouth daily with supper.     No current facility-administered medications for this visit.   Facility-Administered Medications Ordered in Other Visits  Medication Dose Route Frequency Provider Last Rate Last Admin   acetaminophen (TYLENOL) tablet 650 mg  650 mg Oral Once Nicholas Lose, MD       CARBOplatin (PARAPLATIN) 600 mg in sodium chloride 0.9 % 250 mL chemo infusion  600 mg Intravenous Once Nicholas Lose, MD       dexamethasone (DECADRON) injection 4 mg  4 mg Intravenous Once Nicholas Lose, MD       diphenhydrAMINE (BENADRYL) capsule 50 mg  50 mg Oral Once Nicholas Lose, MD       DOCEtaxel (TAXOTERE) 130 mg in sodium chloride 0.9 % 250 mL chemo infusion  65 mg/m2 (Treatment Plan Recorded) Intravenous Once Nicholas Lose, MD       fosaprepitant (EMEND) 150 mg in sodium chloride 0.9 % 145 mL IVPB  150 mg Intravenous Once Nicholas Lose, MD       heparin lock flush 100 unit/mL  500 Units Intracatheter Once PRN Nicholas Lose, MD  palonosetron (ALOXI) injection 0.25 mg  0.25 mg Intravenous Once Nicholas Lose, MD       sodium chloride flush (NS) 0.9 % injection 10 mL  10 mL Intracatheter PRN Nicholas Lose, MD       trastuzumab-anns (KANJINTI) 735 mg in sodium chloride 0.9 % 250 mL chemo infusion  8 mg/kg (Treatment Plan Recorded) Intravenous Once Nicholas Lose, MD        PHYSICAL EXAMINATION: ECOG PERFORMANCE STATUS: 0 - Asymptomatic  Vitals:   12/27/22 0952  BP: (!) 154/96  Pulse: 81  Resp: 16  Temp: (!) 97.5 F (36.4 C)  SpO2: 98%   Filed Weights   12/27/22 0952  Weight: 198 lb 6.4 oz (90 kg)      LABORATORY DATA:  I have reviewed the data as listed    Latest Ref Rng & Units 12/27/2022    9:27 AM 12/17/2022    6:38 AM 10/31/2022   12:19 PM  CMP  Glucose 70 - 99 mg/dL 176  140  101   BUN 8 - 23 mg/dL '21  10  12   '$ Creatinine 0.44 - 1.00 mg/dL 1.24  1.03  1.22   Sodium 135 - 145 mmol/L 136  135   141   Potassium 3.5 - 5.1 mmol/L 3.9  3.8  4.4   Chloride 98 - 111 mmol/L 102  104  107   CO2 22 - 32 mmol/L '26  22  27   '$ Calcium 8.9 - 10.3 mg/dL 9.5  8.6  9.9   Total Protein 6.5 - 8.1 g/dL 7.7  6.8  8.4   Total Bilirubin 0.3 - 1.2 mg/dL 0.4  0.3  0.3   Alkaline Phos 38 - 126 U/L 99  81  101   AST 15 - 41 U/L '12  18  14   '$ ALT 0 - 44 U/L '16  19  16     '$ Lab Results  Component Value Date   WBC 9.0 12/27/2022   HGB 12.7 12/27/2022   HCT 38.2 12/27/2022   MCV 81.4 12/27/2022   PLT 299 12/27/2022   NEUTROABS 6.8 12/27/2022    ASSESSMENT & PLAN:  Malignant neoplasm of upper-outer quadrant of left breast in female, estrogen receptor positive ([place]) 10/17/2022 Diffuse bilateral breast pains.  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  The patient has a good understanding of the overall plan. she agrees with it. she will  call with any problems that may develop before the next visit here. Total time spent: 30 mins including face to  face time and time spent for planning, charting and co-ordination of care   Harriette Ohara, MD 12/27/22    I Gardiner Coins am acting as a Education administrator for Dr.Cesario Weidinger  I have reviewed the above documentation for accuracy and completeness, and I agree with the above.

## 2022-12-28 ENCOUNTER — Encounter: Payer: Self-pay | Admitting: Hematology and Oncology

## 2022-12-28 ENCOUNTER — Telehealth: Payer: Self-pay

## 2022-12-28 NOTE — Telephone Encounter (Signed)
-----  Message from Tildon Husky, RN sent at 12/27/2022  3:19 PM EST ----- Regarding: first time treatment call back Lindi Adie [facility] Patient received [facility] treatment today for the first time. She is followed by Dr. Lindi Adie.M Treatment well with no issues. She tolerated treatment well!!

## 2022-12-28 NOTE — Research (Signed)
Trial: [id number]: Use of a Clinical Trial Screening Tool to Address Cancer Health Disparities in the [place] [place] [facility] [place])  [name] was identified by this RN as a potential candidate for the above listed study.  This Clinical Research Nurse met with Tonya Vincent, [id number], on 12/28/22 in a manner and location that ensures patient privacy to discuss participation in the above listed research study.  Patient is Unaccompanied.  A copy of the informed consent document and separate HIPAA Authorization was provided to the patient.  Patient reads, speaks, and understands [place].    Patient was provided with the business card of this Nurse and encouraged to contact the research team with any questions.  Patient was provided the option of taking informed consent documents home to review and was encouraged to review at their convenience with their support network, including other care providers. Patient is comfortable with making a decision regarding study participation today.  As outlined in the informed consent form, this Nurse and Tonya Vincent discussed the purpose of the research study, the investigational nature of the study, study procedures and requirements for study participation, potential risks and benefits of study participation, as well as alternatives to participation. This study is not blinded. The patient understands participation is voluntary and they may withdraw from study participation at any time.  This study does not involve randomization.  This study does not involve an investigational drug or device. This study does not involve a placebo. Patient understands enrollment is pending full eligibility review.   Confidentiality and how the patient's information will be used as part of study participation were discussed.  Patient was informed there is not reimbursement provided for their time and effort spent on trial participation.  The patient  is encouraged to discuss research study participation with their insurance provider to determine what costs they may incur as part of study participation, including research related injury.    All questions were answered to patient's satisfaction.  The informed consent and separate HIPAA Authorization was reviewed page by page.  The patient's mental and emotional status is appropriate to provide informed consent, and the patient verbalizes an understanding of study participation.  Patient has agreed to participate in the above listed research study and has voluntarily signed the informed consent date 07/20/22 and separate HIPAA Authorization, version 15  on 12/27/22 at 3:05PM.  The patient was provided with a copy of the signed informed consent form and separate HIPAA Authorization via mail for their reference.  No study specific procedures were obtained prior to the signing of the informed consent document.  Approximately 15 minutes were spent with the patient reviewing the informed consent documents.  Patient was not requested to complete a Release of Information form.  DCP questions were answered; study participation is complete.  Tonya Skiff Geanine Vandekamp, RN, BSN, [facility]  Her  H[facility] Clinical Research Nurse [place] [phone]  Pager [phone]

## 2022-12-28 NOTE — Telephone Encounter (Signed)
Tonya Vincent states that she is doing fine. She is eating, drinking, and urinating well. She knows to call the office at [phone] if she has any questions or concerns

## 2022-12-29 ENCOUNTER — Inpatient Hospital Stay: Payer: Medicare HMO

## 2022-12-29 VITALS — BP 146/85 | HR 73 | Temp 97.2°F

## 2022-12-29 DIAGNOSIS — Z170 Estrogen receptor positive status [ER+]: Secondary | ICD-10-CM

## 2022-12-29 DIAGNOSIS — Z5111 Encounter for antineoplastic chemotherapy: Secondary | ICD-10-CM | POA: Diagnosis not present

## 2022-12-29 DIAGNOSIS — C50412 Malignant neoplasm of upper-outer quadrant of left female breast: Secondary | ICD-10-CM | POA: Diagnosis not present

## 2022-12-29 DIAGNOSIS — C50512 Malignant neoplasm of lower-outer quadrant of left female breast: Secondary | ICD-10-CM | POA: Diagnosis not present

## 2022-12-29 DIAGNOSIS — Z923 Personal history of irradiation: Secondary | ICD-10-CM | POA: Diagnosis not present

## 2022-12-29 DIAGNOSIS — Z79899 Other long term (current) drug therapy: Secondary | ICD-10-CM | POA: Diagnosis not present

## 2022-12-29 MED ORDER — PEGFILGRASTIM INJECTION 6 MG/0.6ML ~~LOC~~
6.0000 mg | PREFILLED_SYRINGE | Freq: Once | SUBCUTANEOUS | Status: AC
  Administered 2022-12-29: 6 mg via SUBCUTANEOUS

## 2022-12-31 ENCOUNTER — Telehealth: Payer: Self-pay | Admitting: *Deleted

## 2022-12-31 NOTE — Telephone Encounter (Signed)
Received call from pt with complaint of increased bone pain post Neulasta injection 12/29/22.  Pt states she is taking OTC Claritin and Tylenol and the pain is starting to dissipate.  RN encouraged pt to continue with medications and review with MD during visit this week.  Pt verbalized understanding.

## 2023-01-01 ENCOUNTER — Other Ambulatory Visit: Payer: Self-pay

## 2023-01-01 DIAGNOSIS — Z170 Estrogen receptor positive status [ER+]: Secondary | ICD-10-CM

## 2023-01-02 NOTE — Assessment & Plan Note (Signed)
10/17/2022 Diffuse bilateral breast pains.  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

## 2023-01-03 ENCOUNTER — Inpatient Hospital Stay: Payer: Medicare HMO | Attending: Hematology and Oncology

## 2023-01-03 ENCOUNTER — Inpatient Hospital Stay: Payer: Medicare HMO

## 2023-01-03 ENCOUNTER — Inpatient Hospital Stay (HOSPITAL_BASED_OUTPATIENT_CLINIC_OR_DEPARTMENT_OTHER): Payer: Medicare HMO | Admitting: Hematology and Oncology

## 2023-01-03 ENCOUNTER — Other Ambulatory Visit: Payer: Self-pay

## 2023-01-03 VITALS — BP 137/71 | HR 93 | Temp 97.2°F | Resp 17 | Wt 190.2 lb

## 2023-01-03 DIAGNOSIS — Z170 Estrogen receptor positive status [ER+]: Secondary | ICD-10-CM | POA: Diagnosis not present

## 2023-01-03 DIAGNOSIS — C50412 Malignant neoplasm of upper-outer quadrant of left female breast: Secondary | ICD-10-CM

## 2023-01-03 DIAGNOSIS — Z79899 Other long term (current) drug therapy: Secondary | ICD-10-CM | POA: Diagnosis not present

## 2023-01-03 DIAGNOSIS — Z5111 Encounter for antineoplastic chemotherapy: Secondary | ICD-10-CM | POA: Insufficient documentation

## 2023-01-03 DIAGNOSIS — Z85048 Personal history of other malignant neoplasm of rectum, rectosigmoid junction, and anus: Secondary | ICD-10-CM | POA: Diagnosis not present

## 2023-01-03 DIAGNOSIS — C20 Malignant neoplasm of rectum: Secondary | ICD-10-CM

## 2023-01-03 DIAGNOSIS — Z923 Personal history of irradiation: Secondary | ICD-10-CM | POA: Diagnosis not present

## 2023-01-03 DIAGNOSIS — R601 Generalized edema: Secondary | ICD-10-CM | POA: Insufficient documentation

## 2023-01-03 DIAGNOSIS — C50512 Malignant neoplasm of lower-outer quadrant of left female breast: Secondary | ICD-10-CM

## 2023-01-03 DIAGNOSIS — Z95828 Presence of other vascular implants and grafts: Secondary | ICD-10-CM

## 2023-01-03 LAB — CBC WITH DIFFERENTIAL (CANCER CENTER ONLY)
Abs Immature Granulocytes: 0.28 10*3/uL — ABNORMAL HIGH (ref 0.00–0.07)
Basophils Absolute: 0.1 10*3/uL (ref 0.0–0.1)
Basophils Relative: 1 %
Eosinophils Absolute: 0.1 10*3/uL (ref 0.0–0.5)
Eosinophils Relative: 1 %
HCT: 42.4 % (ref 36.0–46.0)
Hemoglobin: 14 g/dL (ref 12.0–15.0)
Immature Granulocytes: 4 %
Lymphocytes Relative: 17 %
Lymphs Abs: 1.1 10*3/uL (ref 0.7–4.0)
MCH: 26.4 pg (ref 26.0–34.0)
MCHC: 33 g/dL (ref 30.0–36.0)
MCV: 80 fL (ref 80.0–100.0)
Monocytes Absolute: 2.1 10*3/uL — ABNORMAL HIGH (ref 0.1–1.0)
Monocytes Relative: 32 %
Neutro Abs: 2.8 10*3/uL (ref 1.7–7.7)
Neutrophils Relative %: 45 %
Platelet Count: 261 10*3/uL (ref 150–400)
RBC: 5.3 MIL/uL — ABNORMAL HIGH (ref 3.87–5.11)
RDW: 15.4 % (ref 11.5–15.5)
Smear Review: NORMAL
WBC Count: 6.4 10*3/uL (ref 4.0–10.5)
nRBC: 0 % (ref 0.0–0.2)

## 2023-01-03 LAB — CMP (CANCER CENTER ONLY)
ALT: 16 U/L (ref 0–44)
AST: 12 U/L — ABNORMAL LOW (ref 15–41)
Albumin: 4.1 g/dL (ref 3.5–5.0)
Alkaline Phosphatase: 104 U/L (ref 38–126)
Anion gap: 9 (ref 5–15)
BUN: 19 mg/dL (ref 8–23)
CO2: 23 mmol/L (ref 22–32)
Calcium: 10.4 mg/dL — ABNORMAL HIGH (ref 8.9–10.3)
Chloride: 99 mmol/L (ref 98–111)
Creatinine: 1.16 mg/dL — ABNORMAL HIGH (ref 0.44–1.00)
GFR, Estimated: 51 mL/min — ABNORMAL LOW (ref >60)
Glucose, Bld: 150 mg/dL — ABNORMAL HIGH (ref 70–99)
Potassium: 5 mmol/L (ref 3.5–5.1)
Sodium: 131 mmol/L — ABNORMAL LOW (ref 135–145)
Total Bilirubin: 0.3 mg/dL (ref 0.3–1.2)
Total Protein: 7.5 g/dL (ref 6.5–8.1)

## 2023-01-03 MED ORDER — SODIUM CHLORIDE 0.9% FLUSH: 10.0000 mL | INTRAVENOUS | Status: DC | PRN

## 2023-01-03 MED ORDER — SODIUM CHLORIDE 0.9% FLUSH
10.0000 mL | Freq: Once | INTRAVENOUS | Status: AC
  Administered 2023-01-03: 10 mL via INTRAVENOUS

## 2023-01-03 MED ORDER — SODIUM CHLORIDE 0.9 % IJ SOLN
10.0000 mL | INTRAMUSCULAR | Status: DC | PRN
  Administered 2023-01-03: 10 mL via INTRAVENOUS

## 2023-01-03 MED ORDER — HEPARIN SOD (PORK) LOCK FLUSH 100 UNIT/ML IV SOLN
500.0000 [IU] | Freq: Once | INTRAVENOUS | Status: AC | PRN
  Administered 2023-01-03: 500 [IU] via INTRAVENOUS

## 2023-01-03 NOTE — Progress Notes (Signed)
Patient Care Team: [name], MD as PCP - General (Family Medicine) [name], MD as Consulting Physician (Hematology and Oncology) Rockwell Germany, RN as Oncology Nurse Navigator Mauro Kaufmann, RN as Oncology Nurse Navigator Nicholas Lose, MD as Consulting Physician (Hematology and Oncology) Coralie Keens, MD as Consulting Physician (General Surgery) Gery Pray, MD as Consulting Physician (Radiation Oncology)  DIAGNOSIS:  Encounter Diagnosis  Name Primary?   Malignant neoplasm of upper-outer quadrant of left breast in female, estrogen receptor positive ([place]) Yes    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 after neoadjuvant chemotherapy and radiation therapy   Primary site: Colon and Rectum   Staging method: AJCC 7th Edition   Clinical: Stage I (T2, N0, M0) signed by [name], MD on 12/28/2013  1:49 PM   Pathologic: Stage I (T2, N0, cM0) signed by [name], MD on 12/28/2013  1:49 PM   Summary: Stage I (T2, N0, cM0)     History of rectal cancer  11/17/2009 Procedure   Colonoscopy and rectal biopsy confirmed moderately differentiated adenocarcinoma   11/18/2009 Imaging   Transrectal ultrasound place staging T3 lesion   11/18/2009 Imaging   Staging CT scan of the chest, abdomen and pelvis showed multiple lesions in the liver as well as paraspinal mass of unknown etiology   12/05/2009 - 04/03/2010 Chemotherapy   The patient completed new adjuvant chemotherapy with 5-FU and radiation therapy   12/14/2009 Imaging   PET CT scan show faint metabolic activity in the paraspinal mass with no activity in the liver   04/24/2010 Surgery   She had surgical resection with negative margins. Final pathology was T2, N0, M0 (down staged by chemoradiation therapy from T3, N0, M0)   11/19/2012 Imaging   MRI of the liver confirmed benign hemangioma   01/08/2014 Procedure   Colonoscopy and biopsy  was negative   12/21/2014 Imaging   CT scan showed no recurrence of colon cancer. Incidentally, paraspinal mass is slightly larger.   10/04/2016 Procedure   She had repeat colonoscopy which showed two 3 mm polyps in the transverse colon, removed with a cold snare. Resected and retrieved. The examination was otherwise normal on direct and retroflexion views.   10/04/2016 Pathology Results   Surgical [P], transverse, polyp(s) - SERRATED POLYP WITH FOCAL FEATURES SUGGESTIVE OF SESSILE SERRATED POLYP/ADENOMA, ONE FRAGMENT. - HYPERPLASTIC POLYP WITHOUT DYSPLASIA, ONE FRAGMENT. - BENIGN COLORECTAL MUCOSA WITHOUT DYSPLASIA, ONE FRAGMENT. - SEE COMMENT. Microscopic Comment After routine specimen processing, three fragments are identified. One of the fragments demonstrates a serrated horizontally situated crypt, which is suggestive of superficial sampling of a sessile serrated polyp/adenoma (the differential includes another fragment of hyperplastic polyp). Although this is the case, the finding is very focal and not definitive.    11/09/2022 Genetic Testing   Negative genetics for [facility] Multi-Cancer +RNA Panel.  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 Report date is 11/09/2022.   The Multi-Cancer + RNA Panel offered by [facility] includes sequencing and/or deletion/duplication analysis of the following 70 genes:  AIP*, ALK, APC*, ATM*, AXIN2*, BAP1*, BARD1*, BLM*, BMPR1A*, BRCA1*, BRCA2*, BRIP1*, CDC73*, CDH1*, CDK4, CDKN1B*, CDKN2A, CHEK2*, CTNNA1*, DICER1*, EPCAM (del/dup only), EGFR, FH*, FLCN*, GREM1 (promoter dup only), HOXB13, KIT, LZTR1, MAX*, MBD4, MEN1*, MET, MITF, MLH1*, MSH2*, MSH3*, MSH6*, MUTYH*, NF1*, NF2*, NTHL1*, PALB2*, PDGFRA, PMS2*, POLD1*, POLE*, POT1*, PRKAR1A*, PTCH1*, PTEN*, RAD51C*, RAD51D*, RB1*, RET, SDHA* (sequencing only), SDHAF2*, SDHB*, SDHC*, SDHD*, SMAD4*, SMARCA4*,  SMARCB1*, SMARCE1*, STK11*, SUFU*, TMEM127*, TP53*, TSC1*, TSC2*, VHL*. RNA analysis  is performed for * genes.    11/09/2022 Genetic Testing   Negative genetics for [facility] Multi-Cancer +RNA Panel.  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 Report date is 11/09/2022.   The Multi-Cancer + RNA Panel offered by [facility] includes sequencing and/or deletion/duplication analysis of the following 70 genes:  AIP*, ALK, APC*, ATM*, AXIN2*, BAP1*, BARD1*, BLM*, BMPR1A*, BRCA1*, BRCA2*, BRIP1*, CDC73*, CDH1*, CDK4, CDKN1B*, CDKN2A, CHEK2*, CTNNA1*, DICER1*, EPCAM (del/dup only), EGFR, FH*, FLCN*, GREM1 (promoter dup only), HOXB13, KIT, LZTR1, MAX*, MBD4, MEN1*, MET, MITF, MLH1*, MSH2*, MSH3*, MSH6*, MUTYH*, NF1*, NF2*, NTHL1*, PALB2*, PDGFRA, PMS2*, POLD1*, POLE*, POT1*, PRKAR1A*, PTCH1*, PTEN*, RAD51C*, RAD51D*, RB1*, RET, SDHA* (sequencing only), SDHAF2*, SDHB*, SDHC*, SDHD*, SMAD4*, SMARCA4*, SMARCB1*, SMARCE1*, STK11*, SUFU*, TMEM127*, TP53*, TSC1*, TSC2*, VHL*. RNA analysis is performed for * genes.    Malignant neoplasm of upper-outer quadrant of left breast in female, estrogen receptor positive ([place])  10/17/2022 Initial Diagnosis   Diffuse bilateral breast pains.  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 Report date is 11/09/2022.   The Multi-Cancer + RNA Panel offered by [facility]  includes sequencing and/or deletion/duplication analysis of the following 70 genes:  AIP*, ALK, APC*, ATM*, AXIN2*, BAP1*, BARD1*, BLM*, BMPR1A*, BRCA1*, BRCA2*, BRIP1*, CDC73*, CDH1*, CDK4, CDKN1B*, CDKN2A, CHEK2*, CTNNA1*, DICER1*, EPCAM (del/dup only), EGFR, FH*, FLCN*, GREM1 (promoter dup only), HOXB13, KIT, LZTR1, MAX*, MBD4, MEN1*, MET, MITF, MLH1*, MSH2*, MSH3*, MSH6*, MUTYH*, NF1*, NF2*, NTHL1*, PALB2*, PDGFRA, PMS2*, POLD1*, POLE*, POT1*, PRKAR1A*, PTCH1*, PTEN*, RAD51C*, RAD51D*, RB1*, RET, SDHA* (sequencing only), SDHAF2*, SDHB*, SDHC*, SDHD*, SMAD4*, SMARCA4*, SMARCB1*, SMARCE1*, STK11*, SUFU*, TMEM127*, TP53*, TSC1*, TSC2*, VHL*. RNA analysis is performed for * genes.    11/14/2022 Surgery   Left lumpectomy: Grade 2 ILC with LCIS 3.5 cm, 1/8 lymph node with micrometastases ER 100%, PR 40%, Ki-67 20%, HER2 3+ positive; lymph node prognostic panel: ER 100% PR 20% Ki-67 10%, HER2 2+ by IHC, FISH negative ratio 1.14   Malignant neoplasm of lower-outer quadrant of left breast of female, estrogen receptor positive ([place])  10/30/2022 Initial Diagnosis   Malignant neoplasm of lower-outer quadrant of left breast of female, estrogen receptor positive ([place])   12/27/2022 -  Chemotherapy   Patient is on Treatment Plan : BREAST Docetaxel + Carboplatin + Trastuzumab (TCH) q21d / Trastuzumab q21d       CHIEF COMPLIANT: Toxicity check cycle 1 day 8  INTERVAL HISTORY: Tonya Vincent is a 72 y.o. female is here because of recent diagnosis of left breast cancer. She states that she is prepared for treatment today. She reports no concerns or issues today to the clinic today. She states first day was fine. She says Saturday she started aching. She said starting Wednesday is when diarrhea started at least 3 loose  stools. This morning she had at least 1 loose stool this morning. She had nausea and lost of appetite. She didn't take the lomotil until this morning.   ALLERGIES:  is allergic to advanced hand  sanitizer [alcohol], norgesic [orphenadrine-aspirin-caffeine], hydrocodone, oxycodone, and antibacterial hand soap [triclosan].  MEDICATIONS:  Current Outpatient Medications  Medication Sig Dispense Refill   diphenoxylate-atropine (LOMOTIL) 2.5-0.025 MG/5ML liquid Take by mouth 4 (four) times daily as needed for diarrhea or loose stools.     loratadine (CLARITIN REDITABS) 10 MG dissolvable tablet Take 10 mg by mouth daily.     acetaminophen-codeine (TYLENOL #3) 300-30 MG tablet Take 1 tablet by mouth every 6 (six) hours as needed for moderate pain. (Patient not taking: Reported on 12/20/2022) 25 tablet 0   amLODipine (NORVASC) 10 MG tablet Take 10 mg by mouth at bedtime.      Aromatic Inhalants (VICKS VAPOINHALER IN) Inhale 1 puff into the lungs daily as needed (congestion).     aspirin EC 81 MG tablet Take 81 mg by mouth at bedtime.      atorvastatin (LIPITOR) 10 MG tablet Take 10 mg by mouth daily.     dexamethasone (DECADRON) 4 MG tablet Take 1 tablet (4 mg total) by mouth daily. Take 1 tablet day before chemo and 1 tablet day after chemo with food (Patient not taking: Reported on 12/20/2022) 8 tablet 0   docusate sodium (COLACE) 100 MG capsule Take 100-400 mg by mouth daily as needed for mild constipation. (Patient not taking: Reported on 12/20/2022)     empagliflozin (JARDIANCE) 25 MG TABS tablet Take 25 mg by mouth daily with supper.      ibuprofen (ADVIL) 800 MG tablet Take 800 mg by mouth every 8 (eight) hours as needed for moderate pain. (Patient not taking: Reported on 12/20/2022)     latanoprost (XALATAN) 0.005 % ophthalmic solution Place 1 drop into both eyes at bedtime.      lidocaine-prilocaine (EMLA) cream Apply to affected area once (Patient not taking: Reported on 12/20/2022) 30 g 3   olmesartan (BENICAR) 40 MG tablet Take 40 mg by mouth daily.     ondansetron (ZOFRAN) 8 MG tablet Take 1 tablet (8 mg total) by mouth every 8 (eight) hours as needed for nausea or vomiting. Start on the  third day after chemotherapy. (Patient not taking: Reported on 12/20/2022) 30 tablet 1   prochlorperazine (COMPAZINE) 10 MG tablet Take 1 tablet (10 mg total) by mouth every 6 (six) hours as needed for nausea or vomiting. (Patient not taking: Reported on 12/20/2022) 30 tablet 1   Semaglutide, 1 MG/DOSE, 2 MG/1.5ML SOPN Inject 1 mg into the skin every Sunday.     spironolactone (ALDACTONE) 25 MG tablet Take 50 mg by mouth daily.     vortioxetine HBr (TRINTELLIX) 10 MG TABS tablet Take 10 mg by mouth daily with supper.     No current facility-administered medications for this visit.    PHYSICAL EXAMINATION: ECOG PERFORMANCE STATUS: 1 - Symptomatic but completely ambulatory  Vitals:   01/03/23 1418  BP: 137/71  Pulse: 93  Resp: 17  Temp: (!) 97.2 F (36.2 C)  SpO2: 96%   Filed Weights   01/03/23 1418  Weight: 190 lb 3 oz (86.3 kg)      LABORATORY DATA:  I have reviewed the data as listed    Latest Ref Rng & Units 01/03/2023    2:01 PM 12/27/2022    9:27 AM 12/17/2022    6:38 AM  CMP  Glucose 70 - 99 mg/dL 150  176  140   BUN 8 - 23 mg/dL '19  21  10   '$ Creatinine 0.44 - 1.00 mg/dL 1.16  1.24  1.03   Sodium 135 - 145 mmol/L 131  136  135   Potassium 3.5 - 5.1 mmol/L 5.0  3.9  3.8   Chloride 98 - 111 mmol/L 99  102  104   CO2 22 - 32 mmol/L '23  26  22   '$ Calcium 8.9 - 10.3 mg/dL 10.4  9.5  8.6   Total Protein 6.5 - 8.1 g/dL 7.5  7.7  6.8   Total Bilirubin 0.3 - 1.2 mg/dL 0.3  0.4  0.3   Alkaline Phos 38 - 126 U/L 104  99  81   AST 15 - 41 U/L '12  12  18   '$ ALT 0 - 44 U/L '16  16  19     '$ Lab Results  Component Value Date   WBC 6.4 01/03/2023   HGB 14.0 01/03/2023   HCT 42.4 01/03/2023   MCV 80.0 01/03/2023   PLT 261 01/03/2023   NEUTROABS PENDING 01/03/2023    ASSESSMENT & PLAN:  Malignant neoplasm of upper-outer quadrant of left breast in female, estrogen receptor positive ([place]) 10/17/2022 Diffuse bilateral breast pains.  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  The patient has a good understanding of the overall plan. she agrees with it. she will call with any problems that may develop before the next visit here. Total time spent: 30 mins including face to face time and time spent for planning, charting and co-ordination of care   Harriette Ohara, MD 01/03/23    I Gardiner Coins am acting as a Education administrator for [employer]  I have reviewed the above documentation for accuracy and completeness, and I agree with the above.

## 2023-01-10 ENCOUNTER — Encounter: Payer: Self-pay | Admitting: Hematology and Oncology

## 2023-01-16 MED FILL — Fosaprepitant Dimeglumine For IV Infusion 150 MG (Base Eq): INTRAVENOUS | Qty: 5 | Status: AC

## 2023-01-17 ENCOUNTER — Inpatient Hospital Stay: Payer: Medicare HMO

## 2023-01-17 ENCOUNTER — Other Ambulatory Visit: Payer: Self-pay

## 2023-01-17 ENCOUNTER — Inpatient Hospital Stay (HOSPITAL_BASED_OUTPATIENT_CLINIC_OR_DEPARTMENT_OTHER): Payer: Medicare HMO | Admitting: Hematology and Oncology

## 2023-01-17 VITALS — BP 163/85 | HR 71 | Temp 98.2°F | Resp 16

## 2023-01-17 VITALS — BP 161/93 | HR 75 | Temp 97.3°F | Resp 17 | Wt 205.4 lb

## 2023-01-17 DIAGNOSIS — C50412 Malignant neoplasm of upper-outer quadrant of left female breast: Secondary | ICD-10-CM

## 2023-01-17 DIAGNOSIS — C50512 Malignant neoplasm of lower-outer quadrant of left female breast: Secondary | ICD-10-CM

## 2023-01-17 DIAGNOSIS — Z170 Estrogen receptor positive status [ER+]: Secondary | ICD-10-CM

## 2023-01-17 DIAGNOSIS — Z85048 Personal history of other malignant neoplasm of rectum, rectosigmoid junction, and anus: Secondary | ICD-10-CM | POA: Diagnosis not present

## 2023-01-17 DIAGNOSIS — Z79899 Other long term (current) drug therapy: Secondary | ICD-10-CM | POA: Diagnosis not present

## 2023-01-17 DIAGNOSIS — R601 Generalized edema: Secondary | ICD-10-CM | POA: Diagnosis not present

## 2023-01-17 DIAGNOSIS — Z5111 Encounter for antineoplastic chemotherapy: Secondary | ICD-10-CM | POA: Diagnosis not present

## 2023-01-17 DIAGNOSIS — Z95828 Presence of other vascular implants and grafts: Secondary | ICD-10-CM

## 2023-01-17 DIAGNOSIS — Z923 Personal history of irradiation: Secondary | ICD-10-CM | POA: Diagnosis not present

## 2023-01-17 DIAGNOSIS — C20 Malignant neoplasm of rectum: Secondary | ICD-10-CM

## 2023-01-17 LAB — CMP (CANCER CENTER ONLY)
ALT: 38 U/L (ref 0–44)
AST: 19 U/L (ref 15–41)
Albumin: 3.7 g/dL (ref 3.5–5.0)
Alkaline Phosphatase: 87 U/L (ref 38–126)
Anion gap: 6 (ref 5–15)
BUN: 13 mg/dL (ref 8–23)
CO2: 28 mmol/L (ref 22–32)
Calcium: 8.6 mg/dL — ABNORMAL LOW (ref 8.9–10.3)
Chloride: 107 mmol/L (ref 98–111)
Creatinine: 0.89 mg/dL (ref 0.44–1.00)
GFR, Estimated: >60 mL/min (ref >60)
Glucose, Bld: 142 mg/dL — ABNORMAL HIGH (ref 70–99)
Potassium: 3.8 mmol/L (ref 3.5–5.1)
Sodium: 141 mmol/L (ref 135–145)
Total Bilirubin: 0.2 mg/dL — ABNORMAL LOW (ref 0.3–1.2)
Total Protein: 6.4 g/dL — ABNORMAL LOW (ref 6.5–8.1)

## 2023-01-17 LAB — CBC WITH DIFFERENTIAL (CANCER CENTER ONLY)
Abs Immature Granulocytes: 0.13 10*3/uL — ABNORMAL HIGH (ref 0.00–0.07)
Basophils Absolute: 0.1 10*3/uL (ref 0.0–0.1)
Basophils Relative: 1 %
Eosinophils Absolute: 0.1 10*3/uL (ref 0.0–0.5)
Eosinophils Relative: 0 %
HCT: 32.1 % — ABNORMAL LOW (ref 36.0–46.0)
Hemoglobin: 10.5 g/dL — ABNORMAL LOW (ref 12.0–15.0)
Immature Granulocytes: 1 %
Lymphocytes Relative: 14 %
Lymphs Abs: 1.8 10*3/uL (ref 0.7–4.0)
MCH: 27.1 pg (ref 26.0–34.0)
MCHC: 32.7 g/dL (ref 30.0–36.0)
MCV: 82.7 fL (ref 80.0–100.0)
Monocytes Absolute: 1.7 10*3/uL — ABNORMAL HIGH (ref 0.1–1.0)
Monocytes Relative: 13 %
Neutro Abs: 9.4 10*3/uL — ABNORMAL HIGH (ref 1.7–7.7)
Neutrophils Relative %: 71 %
Platelet Count: 290 10*3/uL (ref 150–400)
RBC: 3.88 MIL/uL (ref 3.87–5.11)
RDW: 17.6 % — ABNORMAL HIGH (ref 11.5–15.5)
WBC Count: 13.1 10*3/uL — ABNORMAL HIGH (ref 4.0–10.5)
nRBC: 0 % (ref 0.0–0.2)

## 2023-01-17 MED ORDER — SODIUM CHLORIDE 0.9 % IV SOLN: Freq: Once | INTRAVENOUS | Status: AC

## 2023-01-17 MED ORDER — TRASTUZUMAB-ANNS CHEMO 150 MG IV SOLR
6.0000 mg/kg | Freq: Once | INTRAVENOUS | Status: AC
  Administered 2023-01-17: 600 mg via INTRAVENOUS
  Filled 2023-01-17: qty 28.57

## 2023-01-17 MED ORDER — SODIUM CHLORIDE 0.9 % IV SOLN
65.0000 mg/m2 | Freq: Once | INTRAVENOUS | Status: AC
  Administered 2023-01-17: 131 mg via INTRAVENOUS
  Filled 2023-01-17: qty 13.1

## 2023-01-17 MED ORDER — SODIUM CHLORIDE 0.9% FLUSH
10.0000 mL | INTRAVENOUS | Status: DC | PRN
  Administered 2023-01-17: 10 mL via INTRAVENOUS

## 2023-01-17 MED ORDER — SODIUM CHLORIDE 0.9 % IV SOLN: 4.0000 mg | Freq: Once | INTRAVENOUS | Status: DC

## 2023-01-17 MED ORDER — PALONOSETRON HCL INJECTION 0.25 MG/5ML
0.2500 mg | Freq: Once | INTRAVENOUS | Status: AC
  Administered 2023-01-17: 0.25 mg via INTRAVENOUS
  Filled 2023-01-17: qty 5

## 2023-01-17 MED ORDER — DIPHENHYDRAMINE HCL 25 MG PO CAPS
50.0000 mg | ORAL_CAPSULE | Freq: Once | ORAL | Status: AC
  Administered 2023-01-17: 50 mg via ORAL
  Filled 2023-01-17: qty 2

## 2023-01-17 MED ORDER — SODIUM CHLORIDE 0.9 % IV SOLN
150.0000 mg | Freq: Once | INTRAVENOUS | Status: AC
  Administered 2023-01-17: 150 mg via INTRAVENOUS
  Filled 2023-01-17: qty 150

## 2023-01-17 MED ORDER — ACETAMINOPHEN 325 MG PO TABS
650.0000 mg | ORAL_TABLET | Freq: Once | ORAL | Status: AC
  Administered 2023-01-17: 650 mg via ORAL
  Filled 2023-01-17: qty 2

## 2023-01-17 MED ORDER — SODIUM CHLORIDE 0.9 % IV SOLN
600.0000 mg | Freq: Once | INTRAVENOUS | Status: AC
  Administered 2023-01-17: 600 mg via INTRAVENOUS
  Filled 2023-01-17: qty 60

## 2023-01-17 MED ORDER — SODIUM CHLORIDE 0.9% FLUSH
10.0000 mL | INTRAVENOUS | Status: DC | PRN
  Administered 2023-01-17: 10 mL

## 2023-01-17 MED ORDER — ALTEPLASE 2 MG IJ SOLR
2.0000 mg | Freq: Once | INTRAMUSCULAR | Status: AC | PRN
  Administered 2023-01-17: 2 mg
  Filled 2023-01-17: qty 2

## 2023-01-17 MED ORDER — DEXAMETHASONE SODIUM PHOSPHATE 10 MG/ML IJ SOLN
4.0000 mg | Freq: Once | INTRAMUSCULAR | Status: AC
  Administered 2023-01-17: 4 mg via INTRAVENOUS
  Filled 2023-01-17: qty 1

## 2023-01-17 MED ORDER — HEPARIN SOD (PORK) LOCK FLUSH 100 UNIT/ML IV SOLN
500.0000 [IU] | Freq: Once | INTRAVENOUS | Status: AC | PRN
  Administered 2023-01-17: 500 [IU]

## 2023-01-17 NOTE — Progress Notes (Signed)
Patient Care Team: [name], MD as PCP - General (Family Medicine) [name], MD as Consulting Physician (Hematology and Oncology) Rockwell Germany, RN as Oncology Nurse Navigator Mauro Kaufmann, RN as Oncology Nurse Navigator Nicholas Lose, MD as Consulting Physician (Hematology and Oncology) Coralie Keens, MD as Consulting Physician (General Surgery) Gery Pray, MD as Consulting Physician (Radiation Oncology)  DIAGNOSIS:  Encounter Diagnoses  Name Primary?   Malignant neoplasm of upper-outer quadrant of left breast in female, estrogen receptor positive ([place]) Yes   Malignant neoplasm of lower-outer quadrant of left breast of female, estrogen receptor positive ([place])     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 after neoadjuvant chemotherapy and radiation therapy   Primary site: Colon and Rectum   Staging method: AJCC 7th Edition   Clinical: Stage I (T2, N0, M0) signed by [name], MD on 12/28/2013  1:49 PM   Pathologic: Stage I (T2, N0, cM0) signed by [name], MD on 12/28/2013  1:49 PM   Summary: Stage I (T2, N0, cM0)     History of rectal cancer  11/17/2009 Procedure   Colonoscopy and rectal biopsy confirmed moderately differentiated adenocarcinoma   11/18/2009 Imaging   Transrectal ultrasound place staging T3 lesion   11/18/2009 Imaging   Staging CT scan of the chest, abdomen and pelvis showed multiple lesions in the liver as well as paraspinal mass of unknown etiology   12/05/2009 - 04/03/2010 Chemotherapy   The patient completed new adjuvant chemotherapy with 5-FU and radiation therapy   12/14/2009 Imaging   PET CT scan show faint metabolic activity in the paraspinal mass with no activity in the liver   04/24/2010 Surgery   She had surgical resection with negative margins. Final pathology was T2, N0, M0 (down staged by chemoradiation therapy from T3, N0, M0)   11/19/2012  Imaging   MRI of the liver confirmed benign hemangioma   01/08/2014 Procedure   Colonoscopy and biopsy was negative   12/21/2014 Imaging   CT scan showed no recurrence of colon cancer. Incidentally, paraspinal mass is slightly larger.   10/04/2016 Procedure   She had repeat colonoscopy which showed two 3 mm polyps in the transverse colon, removed with a cold snare. Resected and retrieved. The examination was otherwise normal on direct and retroflexion views.   10/04/2016 Pathology Results   Surgical [P], transverse, polyp(s) - SERRATED POLYP WITH FOCAL FEATURES SUGGESTIVE OF SESSILE SERRATED POLYP/ADENOMA, ONE FRAGMENT. - HYPERPLASTIC POLYP WITHOUT DYSPLASIA, ONE FRAGMENT. - BENIGN COLORECTAL MUCOSA WITHOUT DYSPLASIA, ONE FRAGMENT. - SEE COMMENT. Microscopic Comment After routine specimen processing, three fragments are identified. One of the fragments demonstrates a serrated horizontally situated crypt, which is suggestive of superficial sampling of a sessile serrated polyp/adenoma (the differential includes another fragment of hyperplastic polyp). Although this is the case, the finding is very focal and not definitive.    11/09/2022 Genetic Testing   Negative genetics for [facility] Multi-Cancer +RNA Panel.  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 Report date is 11/09/2022.   The Multi-Cancer + RNA Panel offered by [facility] includes sequencing and/or deletion/duplication analysis of the following 70 genes:  AIP*, ALK, APC*, ATM*, AXIN2*, BAP1*, BARD1*, BLM*, BMPR1A*, BRCA1*, BRCA2*, BRIP1*, CDC73*, CDH1*, CDK4, CDKN1B*, CDKN2A, CHEK2*, CTNNA1*, DICER1*, EPCAM (del/dup only), EGFR, FH*, FLCN*, GREM1 (promoter dup only), HOXB13, KIT, LZTR1, MAX*, MBD4, MEN1*, MET, MITF, MLH1*, MSH2*, MSH3*, MSH6*, MUTYH*, NF1*, NF2*, NTHL1*, PALB2*, PDGFRA, PMS2*, POLD1*, POLE*,  POT1*, PRKAR1A*, PTCH1*, PTEN*, RAD51C*, RAD51D*, RB1*, RET, SDHA* (sequencing only), SDHAF2*, SDHB*, SDHC*, SDHD*,  SMAD4*, SMARCA4*, SMARCB1*, SMARCE1*, STK11*, SUFU*, TMEM127*, TP53*, TSC1*, TSC2*, VHL*. RNA analysis is performed for * genes.    11/09/2022 Genetic Testing   Negative genetics for [facility] Multi-Cancer +RNA Panel.  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 Report date is 11/09/2022.   The Multi-Cancer + RNA Panel offered by [facility] includes sequencing and/or deletion/duplication analysis of the following 70 genes:  AIP*, ALK, APC*, ATM*, AXIN2*, BAP1*, BARD1*, BLM*, BMPR1A*, BRCA1*, BRCA2*, BRIP1*, CDC73*, CDH1*, CDK4, CDKN1B*, CDKN2A, CHEK2*, CTNNA1*, DICER1*, EPCAM (del/dup only), EGFR, FH*, FLCN*, GREM1 (promoter dup only), HOXB13, KIT, LZTR1, MAX*, MBD4, MEN1*, MET, MITF, MLH1*, MSH2*, MSH3*, MSH6*, MUTYH*, NF1*, NF2*, NTHL1*, PALB2*, PDGFRA, PMS2*, POLD1*, POLE*, POT1*, PRKAR1A*, PTCH1*, PTEN*, RAD51C*, RAD51D*, RB1*, RET, SDHA* (sequencing only), SDHAF2*, SDHB*, SDHC*, SDHD*, SMAD4*, SMARCA4*, SMARCB1*, SMARCE1*, STK11*, SUFU*, TMEM127*, TP53*, TSC1*, TSC2*, VHL*. RNA analysis is performed for * genes.    Malignant neoplasm of upper-outer quadrant of left breast in female, estrogen receptor positive ([place])  10/17/2022 Initial Diagnosis   Diffuse bilateral breast pains.  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  Variant of uncertain significance in KIT at  c.839C>T (p.Ala280Val). Report date is 11/09/2022.   The Multi-Cancer + RNA Panel offered by [facility] includes sequencing and/or deletion/duplication analysis of the following 70 genes:  AIP*, ALK, APC*, ATM*, AXIN2*, BAP1*, BARD1*, BLM*, BMPR1A*, BRCA1*, BRCA2*, BRIP1*, CDC73*, CDH1*, CDK4, CDKN1B*, CDKN2A, CHEK2*, CTNNA1*, DICER1*, EPCAM (del/dup only), EGFR, FH*, FLCN*, GREM1 (promoter dup only), HOXB13, KIT, LZTR1, MAX*, MBD4, MEN1*, MET, MITF, MLH1*, MSH2*, MSH3*, MSH6*, MUTYH*, NF1*, NF2*, NTHL1*, PALB2*, PDGFRA, PMS2*, POLD1*, POLE*, POT1*, PRKAR1A*, PTCH1*, PTEN*, RAD51C*, RAD51D*, RB1*, RET, SDHA* (sequencing only), SDHAF2*, SDHB*, SDHC*, SDHD*, SMAD4*, SMARCA4*, SMARCB1*, SMARCE1*, STK11*, SUFU*, TMEM127*, TP53*, TSC1*, TSC2*, VHL*. RNA analysis is performed for * genes.    11/14/2022 Surgery   Left lumpectomy: Grade 2 ILC with LCIS 3.5 cm, 1/8 lymph node with micrometastases ER 100%, PR 40%, Ki-67 20%, HER2 3+ positive; lymph node prognostic panel: ER 100% PR 20% Ki-67 10%, HER2 2+ by IHC, FISH negative ratio 1.14   Malignant neoplasm of lower-outer quadrant of left breast of female, estrogen receptor positive ([place])  10/30/2022 Initial Diagnosis   Malignant neoplasm of lower-outer quadrant of left breast of female, estrogen receptor positive ([place])   12/27/2022 -  Chemotherapy   Patient is on Treatment Plan : BREAST Docetaxel + Carboplatin + Trastuzumab ([place]) q21d / Trastuzumab q21d       CHIEF COMPLIANT: Cycle 2 TCH   INTERVAL HISTORY: Tonya Vincent is a 72 y.o. female is here because of recent diagnosis of left breast cancer. She presents to the clinic for a follow-up. She states surgery went great. She reports after the shot she was in pain for a couple days. Then she says  3-4 days later she din not want to eat. But overall she is better today. She complains of diarrhea 5 times a day. She says she started taking the loperamide for relief. She denies any nausea.    ALLERGIES:  is  allergic to advanced hand sanitizer [alcohol], norgesic [orphenadrine-aspirin-caffeine], hydrocodone, oxycodone, and antibacterial hand soap [triclosan].  MEDICATIONS:  Current Outpatient Medications  Medication Sig Dispense Refill   acetaminophen-codeine (TYLENOL #3) 300-30 MG tablet Take 1 tablet by mouth every 6 (six) hours as needed for moderate pain. 25 tablet 0   amLODipine (NORVASC) 10 MG tablet Take 10 mg by mouth at bedtime.      Aromatic Inhalants (VICKS VAPOINHALER IN) Inhale 1 puff into the lungs daily as needed (congestion).     aspirin EC 81 MG tablet Take 81 mg by mouth at bedtime.      atorvastatin (LIPITOR) 10 MG tablet Take 10 mg by mouth daily.     dexamethasone (DECADRON) 4 MG tablet Take 1 tablet (4 mg total) by mouth daily. Take 1 tablet day before chemo and 1 tablet day after chemo with food 8 tablet 0   docusate sodium (COLACE) 100 MG capsule Take 100-400 mg by mouth daily as needed for mild constipation.     empagliflozin (JARDIANCE) 25 MG TABS tablet Take 25 mg by mouth daily with supper.      ibuprofen (ADVIL) 800 MG tablet Take 800 mg by mouth every 8 (eight) hours as needed for moderate pain.     latanoprost (XALATAN) 0.005 % ophthalmic solution Place 1 drop into both eyes at bedtime.      lidocaine-prilocaine (EMLA) cream Apply to affected area once 30 g 3   loperamide (IMODIUM A-D) 2 MG tablet Take 2 mg by mouth 4 (four) times daily as needed for diarrhea or loose stools.     loratadine (CLARITIN REDITABS) 10 MG dissolvable tablet Take 10 mg by mouth daily.     olmesartan (BENICAR) 40 MG tablet Take 40 mg by mouth daily.     ondansetron (ZOFRAN) 8 MG tablet Take 1 tablet (8 mg total) by mouth every 8 (eight) hours as needed for nausea or vomiting. Start on the third day after chemotherapy. 30 tablet 1   prochlorperazine (COMPAZINE) 10 MG tablet Take 1 tablet (10 mg total) by mouth every 6 (six) hours as needed for nausea or vomiting. 30 tablet 1   Semaglutide, 1  MG/DOSE, 2 MG/1.5ML SOPN Inject 1 mg into the skin every Sunday.     spironolactone (ALDACTONE) 25 MG tablet Take 50 mg by mouth daily.     vortioxetine HBr (TRINTELLIX) 10 MG TABS tablet Take 10 mg by mouth daily with supper.     No current facility-administered medications for this visit.    PHYSICAL EXAMINATION: ECOG PERFORMANCE STATUS: 1 - Symptomatic but completely ambulatory  Vitals:   01/17/23 1057  BP: (!) 161/93  Pulse: 75  Resp: 17  Temp: (!) 97.3 F (36.3 C)  SpO2: 99%   Filed Weights   01/17/23 1057  Weight: 205 lb 6 oz (93.2 kg)      LABORATORY DATA:  I have reviewed the data as listed    Latest Ref Rng & Units 01/03/2023    2:01 PM 12/27/2022    9:27 AM 12/17/2022    6:38 AM  CMP  Glucose 70 - 99 mg/dL 150  176  140   BUN 8 - 23 mg/dL 19  21  10   $ Creatinine 0.44 -  1.00 mg/dL 1.16  1.24  1.03   Sodium 135 - 145 mmol/L 131  136  135   Potassium 3.5 - 5.1 mmol/L 5.0  3.9  3.8   Chloride 98 - 111 mmol/L 99  102  104   CO2 22 - 32 mmol/L 23  26  22   $ Calcium 8.9 - 10.3 mg/dL 10.4  9.5  8.6   Total Protein 6.5 - 8.1 g/dL 7.5  7.7  6.8   Total Bilirubin 0.3 - 1.2 mg/dL 0.3  0.4  0.3   Alkaline Phos 38 - 126 U/L 104  99  81   AST 15 - 41 U/L 12  12  18   $ ALT 0 - 44 U/L 16  16  19     $ Lab Results  Component Value Date   WBC 13.1 (H) 01/17/2023   HGB 10.5 (L) 01/17/2023   HCT 32.1 (L) 01/17/2023   MCV 82.7 01/17/2023   PLT 290 01/17/2023   NEUTROABS 9.4 (H) 01/17/2023    ASSESSMENT & PLAN:  Malignant neoplasm of upper-outer quadrant of left breast in female, estrogen receptor positive ([place]) 10/17/2022 Diffuse bilateral breast pains.  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 Return to clinic in 3 weeks for cycle 3    No orders of the defined types were placed in this encounter.  The patient has a good understanding of the overall plan. she agrees with it. she will call with any problems that may develop before the next visit here. Total time spent: 30 mins including face to face time and time spent for planning, charting and co-ordination of care   Harriette Ohara, MD 01/17/23    I Gardiner Coins am acting as a Education administrator for [employer]  I have reviewed the above documentation for accuracy and completeness, and I agree with the above.

## 2023-01-17 NOTE — Progress Notes (Signed)
At 1455, patient PAC finally drew blood. Cathflo removed and flushed with good blood return. After treatment complete PAC was fully flushed (had blood return) and heparinized. De-accessed.

## 2023-01-17 NOTE — Assessment & Plan Note (Signed)
10/17/2022 Diffuse bilateral breast pains.  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

## 2023-01-18 ENCOUNTER — Inpatient Hospital Stay (HOSPITAL_BASED_OUTPATIENT_CLINIC_OR_DEPARTMENT_OTHER): Payer: Medicare HMO | Admitting: Physician Assistant

## 2023-01-18 ENCOUNTER — Telehealth: Payer: Self-pay | Admitting: *Deleted

## 2023-01-18 ENCOUNTER — Other Ambulatory Visit: Payer: Self-pay

## 2023-01-18 ENCOUNTER — Inpatient Hospital Stay: Payer: Medicare HMO

## 2023-01-18 ENCOUNTER — Ambulatory Visit (HOSPITAL_COMMUNITY)
Admission: RE | Admit: 2023-01-18 | Discharge: 2023-01-18 | Disposition: A | Discharge status: Home / Self Care | Payer: Medicare HMO | Source: Ambulatory Visit | Attending: Physician Assistant | Admitting: Physician Assistant

## 2023-01-18 ENCOUNTER — Encounter: Payer: Self-pay | Admitting: Hematology and Oncology

## 2023-01-18 VITALS — BP 145/81 | HR 93 | Temp 98.4°F | Resp 16

## 2023-01-18 DIAGNOSIS — Z170 Estrogen receptor positive status [ER+]: Secondary | ICD-10-CM

## 2023-01-18 DIAGNOSIS — Z923 Personal history of irradiation: Secondary | ICD-10-CM | POA: Diagnosis not present

## 2023-01-18 DIAGNOSIS — Z5111 Encounter for antineoplastic chemotherapy: Secondary | ICD-10-CM | POA: Diagnosis not present

## 2023-01-18 DIAGNOSIS — R601 Generalized edema: Secondary | ICD-10-CM

## 2023-01-18 DIAGNOSIS — Z79899 Other long term (current) drug therapy: Secondary | ICD-10-CM | POA: Diagnosis not present

## 2023-01-18 DIAGNOSIS — Z95828 Presence of other vascular implants and grafts: Secondary | ICD-10-CM | POA: Diagnosis not present

## 2023-01-18 DIAGNOSIS — C20 Malignant neoplasm of rectum: Secondary | ICD-10-CM | POA: Insufficient documentation

## 2023-01-18 DIAGNOSIS — Z85048 Personal history of other malignant neoplasm of rectum, rectosigmoid junction, and anus: Secondary | ICD-10-CM

## 2023-01-18 DIAGNOSIS — R0602 Shortness of breath: Secondary | ICD-10-CM | POA: Diagnosis not present

## 2023-01-18 DIAGNOSIS — C50412 Malignant neoplasm of upper-outer quadrant of left female breast: Secondary | ICD-10-CM | POA: Diagnosis not present

## 2023-01-18 DIAGNOSIS — C50512 Malignant neoplasm of lower-outer quadrant of left female breast: Secondary | ICD-10-CM

## 2023-01-18 LAB — URINALYSIS, COMPLETE (UACMP) WITH MICROSCOPIC
Bilirubin Urine: NEGATIVE
Glucose, UA: >=500 mg/dL — AB
Ketones, ur: NEGATIVE mg/dL
Nitrite: NEGATIVE
Protein, ur: NEGATIVE mg/dL
Specific Gravity, Urine: 1.016 (ref 1.005–1.030)
pH: 6 (ref 5.0–8.0)

## 2023-01-18 LAB — CBC WITH DIFFERENTIAL (CANCER CENTER ONLY)
Abs Immature Granulocytes: 0.16 10*3/uL — ABNORMAL HIGH (ref 0.00–0.07)
Basophils Absolute: 0 10*3/uL (ref 0.0–0.1)
Basophils Relative: 0 %
Eosinophils Absolute: 0 10*3/uL (ref 0.0–0.5)
Eosinophils Relative: 0 %
HCT: 32.5 % — ABNORMAL LOW (ref 36.0–46.0)
Hemoglobin: 10.6 g/dL — ABNORMAL LOW (ref 12.0–15.0)
Immature Granulocytes: 2 %
Lymphocytes Relative: 3 %
Lymphs Abs: 0.3 10*3/uL — ABNORMAL LOW (ref 0.7–4.0)
MCH: 27.2 pg (ref 26.0–34.0)
MCHC: 32.6 g/dL (ref 30.0–36.0)
MCV: 83.3 fL (ref 80.0–100.0)
Monocytes Absolute: 0.4 10*3/uL (ref 0.1–1.0)
Monocytes Relative: 4 %
Neutro Abs: 8.3 10*3/uL — ABNORMAL HIGH (ref 1.7–7.7)
Neutrophils Relative %: 91 %
Platelet Count: 310 10*3/uL (ref 150–400)
RBC: 3.9 MIL/uL (ref 3.87–5.11)
RDW: 18.3 % — ABNORMAL HIGH (ref 11.5–15.5)
WBC Count: 9.2 10*3/uL (ref 4.0–10.5)
nRBC: 0 % (ref 0.0–0.2)

## 2023-01-18 LAB — CMP (CANCER CENTER ONLY)
ALT: 54 U/L — ABNORMAL HIGH (ref 0–44)
AST: 25 U/L (ref 15–41)
Albumin: 3.9 g/dL (ref 3.5–5.0)
Alkaline Phosphatase: 89 U/L (ref 38–126)
Anion gap: 8 (ref 5–15)
BUN: 20 mg/dL (ref 8–23)
CO2: 25 mmol/L (ref 22–32)
Calcium: 8 mg/dL — ABNORMAL LOW (ref 8.9–10.3)
Chloride: 105 mmol/L (ref 98–111)
Creatinine: 0.86 mg/dL (ref 0.44–1.00)
GFR, Estimated: >60 mL/min (ref >60)
Glucose, Bld: 231 mg/dL — ABNORMAL HIGH (ref 70–99)
Potassium: 4.1 mmol/L (ref 3.5–5.1)
Sodium: 138 mmol/L (ref 135–145)
Total Bilirubin: 0.3 mg/dL (ref 0.3–1.2)
Total Protein: 6.6 g/dL (ref 6.5–8.1)

## 2023-01-18 LAB — BRAIN NATRIURETIC PEPTIDE: B Natriuretic Peptide: 73.2 pg/mL (ref 0.0–100.0)

## 2023-01-18 MED ORDER — HEPARIN SOD (PORK) LOCK FLUSH 100 UNIT/ML IV SOLN
500.0000 [IU] | Freq: Once | INTRAVENOUS | Status: AC | PRN
  Administered 2023-01-18: 500 [IU] via INTRAVENOUS

## 2023-01-18 MED ORDER — SODIUM CHLORIDE 0.9% FLUSH
10.0000 mL | INTRAVENOUS | Status: DC | PRN
  Administered 2023-01-18: 10 mL via INTRAVENOUS

## 2023-01-18 MED ORDER — HEPARIN SOD (PORK) LOCK FLUSH 100 UNIT/ML IV SOLN: 500.0000 [IU] | Freq: Once | INTRAVENOUS | Status: DC | PRN

## 2023-01-18 MED ORDER — ALTEPLASE 2 MG IJ SOLR
2.0000 mg | Freq: Once | INTRAMUSCULAR | Status: AC | PRN
  Administered 2023-01-18: 2 mg
  Filled 2023-01-18: qty 2

## 2023-01-18 MED ORDER — FUROSEMIDE 20 MG PO TABS: 20.0000 mg | ORAL_TABLET | Freq: Two times a day (BID) | ORAL | 0 refills | Status: DC

## 2023-01-18 NOTE — Progress Notes (Addendum)
Symptom Management Consult note [place]    Patient Care Team: [name], MD as PCP - General (Family Medicine) [name], MD as Consulting Physician (Hematology and Oncology) Rockwell Germany, RN as Oncology Nurse Navigator Mauro Kaufmann, RN as Oncology Nurse Navigator Nicholas Lose, MD as Consulting Physician (Hematology and Oncology) Coralie Keens, MD as Consulting Physician (General Surgery) Gery Pray, MD as Consulting Physician (Radiation Oncology)    Name / MRN / DOB: Tonya Vincent  [id number]  10-17-1951   Date of visit: 01/18/2023   Chief Complaint/Reason for visit: bloody urine, left leg swelling   Current Therapy: [place] with Neulasta  Last treatment:  Day 1   Cycle 2 on 01/17/23   ASSESSMENT & PLAN: Patient is a 71 y.o. female  with oncologic history of malignant neoplasm of upper-outer quadrant of left breast in female, estrogen receptor positive followed by Dr. Lindi Adie.  I have viewed most recent oncology note and lab work.    #Malignant neoplasm of upper-outer quadrant of left breast in female, estrogen receptor positive  -Neulasta injection scheduled for tomorrow - Next appointment with oncologist is 02/07/23   #Anasarca -On exam patient is nontoxic-appearing.  She is hemodynamically stable.  She has equal bilateral lower extremity 2+ pitting edema as well as mild edema to bilateral forearms.  Lung exam is clear and she has normal work of breathing.  Weight is up 15 pounds from last visit x 15 days ago. -Patient did complain of shortness of breath for the last times several months.  She was ambulated in clinic and oxygen saturations stayed above 95%, no tachycardia noted. -Concern for fluid retention as AE of her Taxotere chemotherapy.  Will start patient on short course of Lasix.  Chart review shows last echo was in 12/2021 and EF was 60-65%. BNP is WNL today. -CBC shows stable anemia, likely secondary to chemotherapy. CMP  with mild hypocalcemia 8.0, hyperglycemia 231 (known diabetic) without evidence of DKA, normal renal function. -Chest xray shows no acute cardiopulmomary findings. I viewed image and agree with radiologist impression.  #Port-a-cath -Chart review shows patient received Cathflo yesterday as they had difficulty getting blood return. There was blood return after Cathflo. -RN today difficulty getting blood return, Cathflo administered.  Prior to discharge blood flow was obtained. -Chest xray shows right port placement tip overlies the right innominate vein, near the expected location of the internal jugular and subclavian vein confluence. -Xray was performed after clinic visit today (Friday evening). She will need to follow up with surgeon or IR for further evaluation of port location. I will reach out to surgeon first thing Monday morning.  #Dysuria -Patient with symptoms of UTI.  No abdominal pain or CVA tenderness. -Discussed results of UA with patient and engaged in shared decision making.  UA has trace leukocytes, 11-20 WBC and rare bacteria.  Patient would like to hold off on antibiotic treatment until urine culture results.  This is reasonable as she is non toxic appearing and afebrile. I will follow-up on urine culture.  -Strict ED precautions discussed should symptoms worsen. -Oncologist made aware of plan.  Heme/Onc History: Oncology History Overview Note  Malignant neoplasm of rectum, cT3N0M0 down staged to ypT2N0M0 after neoadjuvant chemotherapy and radiation therapy   Primary site: Colon and Rectum   Staging method: AJCC 7th Edition   Clinical: Stage I (T2, N0, M0) signed by [name], MD on 12/28/2013  1:49 PM   Pathologic: Stage I (T2, N0, cM0)  signed by [name], MD on 12/28/2013  1:49 PM   Summary: Stage I (T2, N0, cM0)     History of rectal cancer  11/17/2009 Procedure   Colonoscopy and rectal biopsy confirmed moderately differentiated adenocarcinoma   11/18/2009 Imaging    Transrectal ultrasound place staging T3 lesion   11/18/2009 Imaging   Staging CT scan of the chest, abdomen and pelvis showed multiple lesions in the liver as well as paraspinal mass of unknown etiology   12/05/2009 - 04/03/2010 Chemotherapy   The patient completed new adjuvant chemotherapy with 5-FU and radiation therapy   12/14/2009 Imaging   PET CT scan show faint metabolic activity in the paraspinal mass with no activity in the liver   04/24/2010 Surgery   She had surgical resection with negative margins. Final pathology was T2, N0, M0 (down staged by chemoradiation therapy from T3, N0, M0)   11/19/2012 Imaging   MRI of the liver confirmed benign hemangioma   01/08/2014 Procedure   Colonoscopy and biopsy was negative   12/21/2014 Imaging   CT scan showed no recurrence of colon cancer. Incidentally, paraspinal mass is slightly larger.   10/04/2016 Procedure   She had repeat colonoscopy which showed two 3 mm polyps in the transverse colon, removed with a cold snare. Resected and retrieved. The examination was otherwise normal on direct and retroflexion views.   10/04/2016 Pathology Results   Surgical [P], transverse, polyp(s) - SERRATED POLYP WITH FOCAL FEATURES SUGGESTIVE OF SESSILE SERRATED POLYP/ADENOMA, ONE FRAGMENT. - HYPERPLASTIC POLYP WITHOUT DYSPLASIA, ONE FRAGMENT. - BENIGN COLORECTAL MUCOSA WITHOUT DYSPLASIA, ONE FRAGMENT. - SEE COMMENT. Microscopic Comment After routine specimen processing, three fragments are identified. One of the fragments demonstrates a serrated horizontally situated crypt, which is suggestive of superficial sampling of a sessile serrated polyp/adenoma (the differential includes another fragment of hyperplastic polyp). Although this is the case, the finding is very focal and not definitive.    11/09/2022 Genetic Testing   Negative genetics for [facility] Multi-Cancer +RNA Panel.  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 Report date  is 11/09/2022.   The Multi-Cancer + RNA Panel offered by [facility] includes sequencing and/or deletion/duplication analysis of the following 70 genes:  AIP*, ALK, APC*, ATM*, AXIN2*, BAP1*, BARD1*, BLM*, BMPR1A*, BRCA1*, BRCA2*, BRIP1*, CDC73*, CDH1*, CDK4, CDKN1B*, CDKN2A, CHEK2*, CTNNA1*, DICER1*, EPCAM (del/dup only), EGFR, FH*, FLCN*, GREM1 (promoter dup only), HOXB13, KIT, LZTR1, MAX*, MBD4, MEN1*, MET, MITF, MLH1*, MSH2*, MSH3*, MSH6*, MUTYH*, NF1*, NF2*, NTHL1*, PALB2*, PDGFRA, PMS2*, POLD1*, POLE*, POT1*, PRKAR1A*, PTCH1*, PTEN*, RAD51C*, RAD51D*, RB1*, RET, SDHA* (sequencing only), SDHAF2*, SDHB*, SDHC*, SDHD*, SMAD4*, SMARCA4*, SMARCB1*, SMARCE1*, STK11*, SUFU*, TMEM127*, TP53*, TSC1*, TSC2*, VHL*. RNA analysis is performed for * genes.    11/09/2022 Genetic Testing   Negative genetics for [facility] Multi-Cancer +RNA Panel.  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 Report date is 11/09/2022.   The Multi-Cancer + RNA Panel offered by [facility] includes sequencing and/or deletion/duplication analysis of the following 70 genes:  AIP*, ALK, APC*, ATM*, AXIN2*, BAP1*, BARD1*, BLM*, BMPR1A*, BRCA1*, BRCA2*, BRIP1*, CDC73*, CDH1*, CDK4, CDKN1B*, CDKN2A, CHEK2*, CTNNA1*, DICER1*, EPCAM (del/dup only), EGFR, FH*, FLCN*, GREM1 (promoter dup only), HOXB13, KIT, LZTR1, MAX*, MBD4, MEN1*, MET, MITF, MLH1*, MSH2*, MSH3*, MSH6*, MUTYH*, NF1*, NF2*, NTHL1*, PALB2*, PDGFRA, PMS2*, POLD1*, POLE*, POT1*, PRKAR1A*, PTCH1*, PTEN*, RAD51C*, RAD51D*, RB1*, RET, SDHA* (sequencing only), SDHAF2*, SDHB*, SDHC*, SDHD*, SMAD4*, SMARCA4*, SMARCB1*, SMARCE1*, STK11*, SUFU*, TMEM127*, TP53*, TSC1*, TSC2*, VHL*. RNA analysis is performed for * genes.    Malignant  neoplasm of upper-outer quadrant of left breast in female, estrogen receptor positive ([place])  10/17/2022 Initial Diagnosis   Diffuse bilateral breast pains.  Mammogram revealed a distortion at 3 cm, ultrasound revealed left breast UOQ posteriorly 2  masses were identified 0.4 cm and 0.8 cm, axilla negative, biopsy showed grade 2 invasive lobular cancer with LCIS ER 95%, PR 5%, HER2 3+ positive, Ki-67 40%, second mass biopsy revealed grade 2 ILC ER 95%, PR 70%, HER2 negative, Ki-67 15%   10/31/2022 Cancer Staging   Staging form: Breast, AJCC 8th Edition - Clinical: Stage IA (cT1a, cN0, cM0, G2, ER+, PR+, HER2+) - Signed by Nicholas Lose, MD on 10/31/2022 Stage prefix: Initial diagnosis Histologic grading system: 3 grade system   11/09/2022 Genetic Testing   Negative genetics for [facility] Multi-Cancer +RNA Panel.  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 Report date is 11/09/2022.   The Multi-Cancer + RNA Panel offered by [facility] includes sequencing and/or deletion/duplication analysis of the following 70 genes:  AIP*, ALK, APC*, ATM*, AXIN2*, BAP1*, BARD1*, BLM*, BMPR1A*, BRCA1*, BRCA2*, BRIP1*, CDC73*, CDH1*, CDK4, CDKN1B*, CDKN2A, CHEK2*, CTNNA1*, DICER1*, EPCAM (del/dup only), EGFR, FH*, FLCN*, GREM1 (promoter dup only), HOXB13, KIT, LZTR1, MAX*, MBD4, MEN1*, MET, MITF, MLH1*, MSH2*, MSH3*, MSH6*, MUTYH*, NF1*, NF2*, NTHL1*, PALB2*, PDGFRA, PMS2*, POLD1*, POLE*, POT1*, PRKAR1A*, PTCH1*, PTEN*, RAD51C*, RAD51D*, RB1*, RET, SDHA* (sequencing only), SDHAF2*, SDHB*, SDHC*, SDHD*, SMAD4*, SMARCA4*, SMARCB1*, SMARCE1*, STK11*, SUFU*, TMEM127*, TP53*, TSC1*, TSC2*, VHL*. RNA analysis is performed for * genes.    11/14/2022 Surgery   Left lumpectomy: Grade 2 ILC with LCIS 3.5 cm, 1/8 lymph node with micrometastases ER 100%, PR 40%, Ki-67 20%, HER2 3+ positive; lymph node prognostic panel: ER 100% PR 20% Ki-67 10%, HER2 2+ by IHC, FISH negative ratio 1.14   Malignant neoplasm of lower-outer quadrant of left breast of female, estrogen receptor positive ([place])  10/30/2022 Initial Diagnosis   Malignant neoplasm of lower-outer quadrant of left breast of female, estrogen receptor positive ([place])   12/27/2022 -  Chemotherapy    Patient is on Treatment Plan : BREAST Docetaxel + Carboplatin + Trastuzumab (TCH) q21d / Trastuzumab q21d         Interval history-: Tonya Vincent is a 72 y.o. female with oncologic history as above presenting [facility] today with chief complaint of dysuria and leg swelling x 1 day.  She is accompanied by her sister who provides additional history.  Patient states last night she noticed her left foot was swollen.  She elevated it throughout the night and when she woke up this morning she noticed that she had swelling in both of her legs that extends to her thighs. Denies any associated pain. She denies history of this.  She states she has a cardiac history of a murmur and hypertension, no CHF.  She states she has been short of breath times several months and it is worse with exertion. Shortness of breath is not worse today. She denies any associated chest pain.  Patients states this morning she developed dysuria and urinary urgency.  She noticed a speck of blood in her urine as well.  She has history of UTIs many years ago.  She denies any associated abdominal, pelvic or back pain.  She has not had any fever or chills. She states her appetite is normal and she has no nausea.  No medications taken prior to arrival.   ROS  All other systems are reviewed and are negative for acute change except as noted  in the HPI.    Allergies  Allergen Reactions   Advanced Hand Sanitizer [Alcohol] Dermatitis   [place] [Orphenadrine-Aspirin-Caffeine] Hives   Hydrocodone Hives   Oxycodone Hives   Antibacterial Hand Soap [Triclosan] Dermatitis     Past Medical History:  Diagnosis Date   ADHD (attention deficit hyperactivity disorder)    Anemia    Anxiety    Arthritis    Breast cancer ([place]) 10/2022   left breast ILC   Complication of anesthesia    difficulty waking up   Depression    Diabetes mellitus    type 2 DM   Dyspnea    with exertion   Heart murmur    History of rectal cancer 10/04/2011    Hypertension    Overactive bladder    Paraspinal mass 12/29/2014   Rectal cancer ([place]) dx'd 11/16/09   xrt comp 01/2010; chemo comp 01/2011   Tubular adenoma of colon 01/08/2014     Past Surgical History:  Procedure Laterality Date   ABDOMINAL HYSTERECTOMY     for uterine fibroids   BREAST LUMPECTOMY WITH RADIOACTIVE SEED LOCALIZATION Left 11/14/2022   Procedure: LEFT BREAST RADIOACTIVE SEED GUIDED LUMPECTOMY x2;  Surgeon: Coralie Keens, MD;  Location: [place];  Service: General;  Laterality: Left;   COLON SURGERY N/A 04/24/2010   "took out half rectum"; rectal cancer   EYE SURGERY     lasik surgery   HAND SURGERY     carpal tunnel- left   HEMORRHOID SURGERY N/A 1980   ORIF ANKLE FRACTURE Right 04/11/2020   Procedure: OPEN REDUCTION INTERNAL FIXATION (ORIF) ANKLE FRACTURE;  Surgeon: Carole Civil, MD;  Location: [facility];  Service: Orthopedics;  Laterality: Right;   PORT-A-CATH REMOVAL N/A 02/17/2019   Procedure: REMOVAL PORT-A-CATH;  Surgeon: Coralie Keens, MD;  Location[facility];  Service: General;  Laterality: N/A;   PORTACATH PLACEMENT     2011   PORTACATH PLACEMENT N/A 12/17/2022   Procedure: INSERTION PORT-A-CATH;  Surgeon: Coralie Keens, MD;  Location: [place];  Service: General;  Laterality: N/A;   RECTAL SURGERY     2010   SENTINEL NODE BIOPSY Left 11/14/2022   Procedure: SENTINEL LYMPH NODE BIOPSY;  Surgeon: Coralie Keens, MD;  Location: [place];  Service: General;  Laterality: Left;   WISDOM TOOTH EXTRACTION      Social History   Socioeconomic History   Marital status: Single    Spouse name: Not on file   Number of children: Not on file   Years of education: Not on file   Highest education level: Not on file  Occupational History   Not on file  Tobacco Use   Smoking status: Former    Packs/day: 0.50    Years: 35.00    Total pack years: 17.50    Types: Cigarettes, Cigars    Quit date: 12/03/2009    Years  since quitting: 13.1   Smokeless tobacco: Never  Vaping Use   Vaping Use: Never used  Substance and Sexual Activity   Alcohol use: Yes    Alcohol/week: 7.0 standard drinks of alcohol    Types: 7 Cans of beer per week    Comment: socially   Drug use: No   Sexual activity: Not Currently    Birth control/protection: Surgical    Comment: Hyst  Other Topics Concern   Not on file  Social History Narrative   Not on file   Social Determinants of Health   Financial Resource Strain: Not  on file  Food Insecurity: Not on file  Transportation Needs: Not on file  Physical Activity: Not on file  Stress: Not on file  Social Connections: Not on file  Intimate Partner Violence: Not on file    Family History  Problem Relation Age of Onset   Leukemia Father 52   Breast cancer Maternal Aunt        dx > 99; mother's maternal half sister   Kidney cancer Maternal Uncle        dx > 73   Breast cancer Paternal Aunt        dx > 44   Breast cancer Cousin        dx > 56; paternal female cousin   Mesothelioma Cousin        paternal female cousin   Colon cancer Neg Hx    Esophageal cancer Neg Hx    Stomach cancer Neg Hx    Rectal cancer Neg Hx      Current Outpatient Medications:    furosemide (LASIX) 20 MG tablet, Take 1 tablet (20 mg total) by mouth 2 (two) times daily for 5 days., Disp: 10 tablet, Rfl: 0   acetaminophen-codeine (TYLENOL #3) 300-30 MG tablet, Take 1 tablet by mouth every 6 (six) hours as needed for moderate pain., Disp: 25 tablet, Rfl: 0   amLODipine (NORVASC) 10 MG tablet, Take 10 mg by mouth at bedtime. , Disp: , Rfl:    Aromatic Inhalants (VICKS VAPOINHALER IN), Inhale 1 puff into the lungs daily as needed (congestion)., Disp: , Rfl:    aspirin EC 81 MG tablet, Take 81 mg by mouth at bedtime. , Disp: , Rfl:    atorvastatin (LIPITOR) 10 MG tablet, Take 10 mg by mouth daily., Disp: , Rfl:    dexamethasone (DECADRON) 4 MG tablet, Take 1 tablet (4 mg total) by mouth daily.  Take 1 tablet day before chemo and 1 tablet day after chemo with food, Disp: 8 tablet, Rfl: 0   docusate sodium (COLACE) 100 MG capsule, Take 100-400 mg by mouth daily as needed for mild constipation., Disp: , Rfl:    empagliflozin (JARDIANCE) 25 MG TABS tablet, Take 25 mg by mouth daily with supper. , Disp: , Rfl:    ibuprofen (ADVIL) 800 MG tablet, Take 800 mg by mouth every 8 (eight) hours as needed for moderate pain., Disp: , Rfl:    latanoprost (XALATAN) 0.005 % ophthalmic solution, Place 1 drop into both eyes at bedtime. , Disp: , Rfl:    lidocaine-prilocaine (EMLA) cream, Apply to affected area once, Disp: 30 g, Rfl: 3   loperamide (IMODIUM A-D) 2 MG tablet, Take 2 mg by mouth 4 (four) times daily as needed for diarrhea or loose stools., Disp: , Rfl:    loratadine (CLARITIN REDITABS) 10 MG dissolvable tablet, Take 10 mg by mouth daily., Disp: , Rfl:    olmesartan (BENICAR) 40 MG tablet, Take 40 mg by mouth daily., Disp: , Rfl:    ondansetron (ZOFRAN) 8 MG tablet, Take 1 tablet (8 mg total) by mouth every 8 (eight) hours as needed for nausea or vomiting. Start on the third day after chemotherapy., Disp: 30 tablet, Rfl: 1   prochlorperazine (COMPAZINE) 10 MG tablet, Take 1 tablet (10 mg total) by mouth every 6 (six) hours as needed for nausea or vomiting., Disp: 30 tablet, Rfl: 1   Semaglutide, 1 MG/DOSE, 2 MG/1.5ML SOPN, Inject 1 mg into the skin every Sunday., Disp: , Rfl:    spironolactone (ALDACTONE)  25 MG tablet, Take 50 mg by mouth daily., Disp: , Rfl:    vortioxetine HBr (TRINTELLIX) 10 MG TABS tablet, Take 10 mg by mouth daily with supper., Disp: , Rfl:   Current Facility-Administered Medications:    sodium chloride flush (NS) 0.9 % injection 10 mL, 10 mL, Intravenous, PRN, Nicholas Lose, MD, 10 mL at 01/18/23 1605  PHYSICAL EXAM: ECOG FS:1 - Symptomatic but completely ambulatory    Vitals:   01/18/23 1504 01/18/23 1543  BP: (!) 145/81   Pulse: 70 93  Resp: 16   Temp: 98.4 F  (36.9 C)   TempSrc: Oral   SpO2: 97% 98%   Physical Exam Vitals and nursing note reviewed.  Constitutional:      Appearance: She is well-developed. She is not ill-appearing or toxic-appearing.  HENT:     Head: Normocephalic.     Nose: Nose normal.     Mouth/Throat:     Mouth: Mucous membranes are moist.     Pharynx: Oropharynx is clear.  Eyes:     Conjunctiva/sclera: Conjunctivae normal.  Neck:     Vascular: No JVD.  Cardiovascular:     Rate and Rhythm: Normal rate and regular rhythm.     Pulses: Normal pulses.          Radial pulses are 2+ on the right side and 2+ on the left side.     Heart sounds: Murmur heard.  Pulmonary:     Effort: Pulmonary effort is normal.     Breath sounds: Normal breath sounds.  Chest:     Comments: Right PAC Abdominal:     General: There is no distension.     Palpations: Abdomen is soft. There is no mass.     Tenderness: There is no abdominal tenderness. There is no right CVA tenderness, left CVA tenderness, guarding or rebound.     Hernia: No hernia is present.  Musculoskeletal:     Cervical back: Normal range of motion.     Right lower leg: 1+ Pitting Edema present.     Left lower leg: 1+ Pitting Edema present.     Comments: Bilateral forearms with mild nonpitting edema   Skin:    General: Skin is warm and dry.     Capillary Refill: Capillary refill takes less than 2 seconds.     Comments: Equal tactile temperature in all extremities  Neurological:     Mental Status: She is oriented to person, place, and time.        LABORATORY DATA: I have reviewed the data as listed    Latest Ref Rng & Units 01/18/2023    1:40 PM 01/17/2023   10:28 AM 01/03/2023    2:01 PM  CBC  WBC 4.0 - 10.5 K/uL 9.2  13.1  6.4   Hemoglobin 12.0 - 15.0 g/dL 10.6  10.5  14.0   Hematocrit 36.0 - 46.0 % 32.5  32.1  42.4   Platelets 150 - 400 K/uL 310  290  261         Latest Ref Rng & Units 01/18/2023    1:40 PM 01/17/2023   10:28 AM 01/03/2023    2:01 PM   CMP  Glucose 70 - 99 mg/dL 231  142  150   BUN 8 - 23 mg/dL 20  13  19   $ Creatinine 0.44 - 1.00 mg/dL 0.86  0.89  1.16   Sodium 135 - 145 mmol/L 138  141  131   Potassium 3.5 - 5.1 mmol/L  4.1  3.8  5.0   Chloride 98 - 111 mmol/L 105  107  99   CO2 22 - 32 mmol/L 25  28  23   $ Calcium 8.9 - 10.3 mg/dL 8.0  8.6  10.4   Total Protein 6.5 - 8.1 g/dL 6.6  6.4  7.5   Total Bilirubin 0.3 - 1.2 mg/dL 0.3  0.2  0.3   Alkaline Phos 38 - 126 U/L 89  87  104   AST 15 - 41 U/L 25  19  12   $ ALT 0 - 44 U/L 54  38  16        RADIOGRAPHIC STUDIES (from last 24 hours if applicable) I have personally reviewed the radiological images as listed and agreed with the findings in the report. DG Chest 2 View  Result Date: 01/18/2023 CLINICAL DATA:  Shortness of breath. EXAM: CHEST - 2 VIEW COMPARISON:  08/01/2013 chest x-ray.  Chest CT 11/07/2022 FINDINGS: The lungs are clear without focal pneumonia, edema, pneumothorax or pleural effusion. The cardiopericardial silhouette is within normal limits for size. Left Port-A-Cath is been removed in the interval with catheter fragment persisting in the left innominate vein with distal tip overlying the proximal SVC. Right Port-A-Cath tip is positioned over the right innominate vein. The visualized bony structures of the thorax are unremarkable. IMPRESSION: 1. No acute cardiopulmonary findings. 2. Interval removal of left Port-A-Cath with catheter fragment persisting in the left innominate vein. \ 3. Right-sided Port-A-Cath tip overlies the right innominate vein, near the expected location of the internal jugular and subclavian vein confluence. Electronically Signed   By: Misty Stanley M.D.   On: 01/18/2023 17:11        Visit Diagnosis: 1. Port-A-Cath in place   2. Malignant neoplasm of upper-outer quadrant of left breast in female, estrogen receptor positive ([place])   3. Anasarca   4. History of malignant neoplasm of rectum   5. Malignant neoplasm of lower-outer  quadrant of left breast of female, estrogen receptor positive ([place])      Orders Placed This Encounter  Procedures   DG Chest 2 View    Standing Status:   Future    Number of Occurrences:   1    Standing Expiration Date:   01/18/2024    Order Specific Question:   Reason for Exam (SYMPTOM  OR DIAGNOSIS REQUIRED)    Answer:   SOB, no blood return from port. breast cancer patient. fluid retention    Order Specific Question:   Preferred imaging location?    Answer:   [facility]   BNP (Brain natriuretic peptide)    Standing Status:   Future    Number of Occurrences:   1    Standing Expiration Date:   01/18/2024    All questions were answered. The patient knows to call the clinic with any problems, questions or concerns. No barriers to learning was detected.  I have spent a total of 30 minutes minutes of face-to-face and non-face-to-face time, preparing to see the patient, obtaining and/or reviewing separately obtained history, performing a medically appropriate examination, counseling and educating the patient, ordering tests, documenting clinical information in the electronic health record, and care coordination (communications with other health care professionals or caregivers).    Thank you for allowing me to participate in the care of this patient.    Barrie Folk, PA-C Department of Hematology/Oncology [place] at [facility] Phone: [phone]  [phone]  01/18/2023 9:43 PM

## 2023-01-18 NOTE — Research (Signed)
TRIAL S2205, ICE COMPRESS: RANDOMIZED TRIAL OF LIMB CRYOCOMPRESSION VERSUS CONTINUOUS COMPRESSION VERSUS LOW CYCLIC COMPRESSION FOR THE PREVENTION  OF TAXANE-INDUCED PERIPHERAL NEUROPATHY   Patient arrives today Unaccompanied for the Week 4/C2D1 visit.    No research labs due. Confirmed no lesions, wounds, injuries to extremities prior to study intervention initiation. Patient's port did not have blood return, so patient has PIV in lower right arm. Study intervention was not applied to right arm due to IV placement.  Cook applied; pre-treatment started. 1324 Started taxane and low cyclic compression. 1329 Tolerability check; pt has no complaints 1425 Taxane complete; switched to post-treatment on Paxman device. 1455 Study intervention complete. Wraps removed. No lesions, wounds, sores, or complaints.   MEDICATION REVIEW: Patient reviews and verifies the current medication list is correct.  MD/PROVIDER VISIT: Patient sees Dr Lindi Adie for today's visit.   ADVERSE EVENTS: Patient NOHELIA GALLIANO reports no AEs related to study intervention. Unrelated AEs as listed below.  ADVERSE EVENT LOG:  SHELBIA WAHLERT [id number]  01/18/2023  Adverse Event Log  Study/Protocol: S2205, ICE COMPRESS: RANDOMIZED TRIAL OF LIMB CRYOCOMPRESSION VERSUS CONTINUOUS COMPRESSION VERSUS LOW CYCLIC COMPRESSION FOR THE PREVENTION  OF TAXANE-INDUCED PERIPHERAL NEUROPATHY Cycle: Week 4/C2D1  Event Grade Onset Date Resolved Date Drug Name Attribution Treatment Comments  Diarrhea Grade 2 01/01/23 01/12/23  Unrelated Controlled with Imodium                                                   DISPOSITION: Upon completion off all study requirements, patient remained in infusion center to complete treatment.   The patient was thanked for their time and continued voluntary participation in this study. [name] has been provided direct contact information and is encouraged to contact this  Nurse for any needs or questions.  Marjie Skiff Koran Seabrook, RN, BSN, [facility] She  Her  Hers Clinical Research Nurse [facility] Direct Dial [phone]  Pager [phone] 01/18/2023 2:11 PM

## 2023-01-18 NOTE — Telephone Encounter (Signed)
Received call from pt with complaint of bloody urine, urine frequency, and left lower extremity swelling.  Per MD pt needing to be seen by [facility] for further evaluation.  Appt scheduled, pt verbalized understanding.

## 2023-01-18 NOTE — Progress Notes (Signed)
Labs ordered for [facility] visit.

## 2023-01-19 ENCOUNTER — Inpatient Hospital Stay: Payer: Medicare HMO

## 2023-01-19 VITALS — BP 158/83 | HR 79 | Temp 98.1°F | Resp 16

## 2023-01-19 DIAGNOSIS — Z923 Personal history of irradiation: Secondary | ICD-10-CM | POA: Diagnosis not present

## 2023-01-19 DIAGNOSIS — Z170 Estrogen receptor positive status [ER+]: Secondary | ICD-10-CM | POA: Diagnosis not present

## 2023-01-19 DIAGNOSIS — Z85048 Personal history of other malignant neoplasm of rectum, rectosigmoid junction, and anus: Secondary | ICD-10-CM | POA: Diagnosis not present

## 2023-01-19 DIAGNOSIS — Z5111 Encounter for antineoplastic chemotherapy: Secondary | ICD-10-CM | POA: Diagnosis not present

## 2023-01-19 DIAGNOSIS — R601 Generalized edema: Secondary | ICD-10-CM | POA: Diagnosis not present

## 2023-01-19 DIAGNOSIS — Z79899 Other long term (current) drug therapy: Secondary | ICD-10-CM | POA: Diagnosis not present

## 2023-01-19 DIAGNOSIS — C50412 Malignant neoplasm of upper-outer quadrant of left female breast: Secondary | ICD-10-CM | POA: Diagnosis not present

## 2023-01-19 MED ORDER — PEGFILGRASTIM INJECTION 6 MG/0.6ML ~~LOC~~
6.0000 mg | PREFILLED_SYRINGE | Freq: Once | SUBCUTANEOUS | Status: AC
  Administered 2023-01-19: 6 mg via SUBCUTANEOUS

## 2023-01-20 ENCOUNTER — Other Ambulatory Visit: Payer: Self-pay | Admitting: Physician Assistant

## 2023-01-20 LAB — URINE CULTURE: Culture: >=100000 — AB

## 2023-01-20 MED ORDER — SULFAMETHOXAZOLE-TRIMETHOPRIM 800-160 MG PO TABS: 1.0000 | ORAL_TABLET | Freq: Two times a day (BID) | ORAL | 0 refills | Status: AC

## 2023-01-20 NOTE — Progress Notes (Signed)
Urine culture collected on 01/18/23 resulted with >100k colonies/mL Klebsiella pneumoniae. Prescription sent to pharmacy for Bactrim DS 800-160 mg BID x 7 days. Longer course prescribed as she is immunocompromised from recent chemotherapy.

## 2023-01-21 ENCOUNTER — Telehealth: Payer: Self-pay

## 2023-01-21 NOTE — Telephone Encounter (Signed)
This RN called and spoke to patient per request of Sherol Dade, PA-C regarding her port placement, leg swelling and antibiotic prescription. Patient verbalized understanding that according to her most recent chest x-ray, her port is not in an appropriate location and this will need to be addressed with Dr. Ninfa Linden. Patient verbalized understanding that she should be expecting a phone call from Dr. Geralyn Flash office in the next few days regarding appointments with Dr. Ninfa Linden and to follow up with Suzan Garibaldi., NP, regarding her leg edema. This RN also informed patient that her antibiotic for her UTI should be ready for pick up, which patient confirms she was notified by her pharmacy it is ready. Patient voiced no further questions or concerns at this time, patient was instructed to call [facility] should she have any issues.

## 2023-01-22 ENCOUNTER — Other Ambulatory Visit: Payer: Self-pay | Admitting: Surgery

## 2023-01-22 ENCOUNTER — Telehealth: Payer: Self-pay

## 2023-01-22 ENCOUNTER — Other Ambulatory Visit: Payer: Self-pay

## 2023-01-22 DIAGNOSIS — Z170 Estrogen receptor positive status [ER+]: Secondary | ICD-10-CM

## 2023-01-22 NOTE — Telephone Encounter (Signed)
Called Tonya Vincent to advise she would need port revision since tip placement was not appropriate. She is in agreement with this.  I personally contacted IR to see if they could eval Tonya Vincent per order and they have no availability until 1st week of march. Contacted Dr Ninfa Linden to see if he could revise at his first available. Awaiting response.  Tonya Vincent will come in 01/29/23 to see Mendel Ryder, NP after a 5 days course of Lasix prescribed by our [facility] PA. Tonya Vincent reports she is feeling better and has noticed a decrease in swelling.   She knows to call with any further concerns. We will be in contact with Tonya Vincent regarding response from Dr Ninfa Linden.

## 2023-01-29 ENCOUNTER — Other Ambulatory Visit: Payer: Self-pay

## 2023-01-29 ENCOUNTER — Ambulatory Visit (HOSPITAL_BASED_OUTPATIENT_CLINIC_OR_DEPARTMENT_OTHER)
Admission: RE | Admit: 2023-01-29 | Discharge: 2023-01-29 | Disposition: A | Discharge status: Home / Self Care | Payer: Medicare HMO | Source: Ambulatory Visit | Attending: Adult Health | Admitting: Adult Health

## 2023-01-29 ENCOUNTER — Encounter: Payer: Self-pay | Admitting: Adult Health

## 2023-01-29 ENCOUNTER — Inpatient Hospital Stay (HOSPITAL_BASED_OUTPATIENT_CLINIC_OR_DEPARTMENT_OTHER): Payer: Medicare HMO | Admitting: Hematology and Oncology

## 2023-01-29 ENCOUNTER — Inpatient Hospital Stay: Payer: Medicare HMO | Attending: Hematology and Oncology | Admitting: Adult Health

## 2023-01-29 VITALS — BP 144/76 | HR 92 | Temp 97.9°F | Resp 18 | Ht 64.0 in | Wt 194.0 lb

## 2023-01-29 DIAGNOSIS — Z79899 Other long term (current) drug therapy: Secondary | ICD-10-CM | POA: Diagnosis not present

## 2023-01-29 DIAGNOSIS — C50412 Malignant neoplasm of upper-outer quadrant of left female breast: Secondary | ICD-10-CM | POA: Diagnosis not present

## 2023-01-29 DIAGNOSIS — I808 Phlebitis and thrombophlebitis of other sites: Secondary | ICD-10-CM

## 2023-01-29 DIAGNOSIS — Z170 Estrogen receptor positive status [ER+]: Secondary | ICD-10-CM

## 2023-01-29 DIAGNOSIS — Z923 Personal history of irradiation: Secondary | ICD-10-CM | POA: Insufficient documentation

## 2023-01-29 DIAGNOSIS — C50512 Malignant neoplasm of lower-outer quadrant of left female breast: Secondary | ICD-10-CM

## 2023-01-29 DIAGNOSIS — Z85048 Personal history of other malignant neoplasm of rectum, rectosigmoid junction, and anus: Secondary | ICD-10-CM | POA: Insufficient documentation

## 2023-01-29 MED ORDER — RIVAROXABAN (XARELTO) VTE STARTER PACK (15 & 20 MG): ORAL_TABLET | ORAL | 0 refills | Status: DC

## 2023-01-29 NOTE — Progress Notes (Unsigned)
Tonya Vincent:    Tonya Vincent, Tonya Vincent [id number]   DIAGNOSIS: Cancer Staging  History of rectal cancer Staging form: Colon and Rectum, AJCC 7th Edition - Clinical: Stage I (T2, N0, M0) - Signed by [name], MD on 12/28/2013 Staged by: Managing physician Diagnostic confirmation: Positive histology Specimen type: Excision Histopathologic type: Adenocarcinoma, NOS - Pathologic: Stage I (T2, N0, cM0) - Signed by [name], MD on 12/28/2013 Diagnostic confirmation: Positive histology Specimen type: Excision Histopathologic type: Adenocarcinoma, NOS  Malignant neoplasm of upper-outer quadrant of left breast in female, estrogen receptor positive ([place]) Staging form: Breast, AJCC 8th Edition - Clinical: Stage IA (cT1a, cN0, cM0, G2, ER+, PR+, HER2+) - Signed by Nicholas Lose, MD on 10/31/2022 Stage prefix: Initial diagnosis Histologic grading system: 3 grade system   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 after neoadjuvant chemotherapy and radiation therapy   Primary site: Colon and Rectum   Staging method: AJCC 7th Edition   Clinical: Stage I (T2, N0, M0) signed by [name], MD on 12/28/2013  1:49 PM   Pathologic: Stage I (T2, N0, cM0) signed by [name], MD on 12/28/2013  1:49 PM   Summary: Stage I (T2, N0, cM0)     History of rectal cancer  11/17/2009 Procedure   Colonoscopy and rectal biopsy confirmed moderately differentiated adenocarcinoma   11/18/2009 Imaging   Transrectal ultrasound place staging T3 lesion   11/18/2009 Imaging   Staging CT scan of the chest, abdomen and pelvis showed multiple lesions in the liver as well as paraspinal mass of unknown etiology   12/05/2009 - 04/03/2010 Chemotherapy   The patient completed new adjuvant chemotherapy with 5-FU and radiation therapy   12/14/2009 Imaging   PET CT scan show faint  metabolic activity in the paraspinal mass with no activity in the liver   04/24/2010 Surgery   She had surgical resection with negative margins. Final pathology was T2, N0, M0 (down staged by chemoradiation therapy from T3, N0, M0)   11/19/2012 Imaging   MRI of the liver confirmed benign hemangioma   01/08/2014 Procedure   Colonoscopy and biopsy was negative   12/21/2014 Imaging   CT scan showed no recurrence of colon cancer. Incidentally, paraspinal mass is slightly larger.   10/04/2016 Procedure   She had repeat colonoscopy which showed two 3 mm polyps in the transverse colon, removed with a cold snare. Resected and retrieved. The examination was otherwise normal on direct and retroflexion views.   10/04/2016 Pathology Results   Surgical [P], transverse, polyp(s) - SERRATED POLYP WITH FOCAL FEATURES SUGGESTIVE OF SESSILE SERRATED POLYP/ADENOMA, ONE FRAGMENT. - HYPERPLASTIC POLYP WITHOUT DYSPLASIA, ONE FRAGMENT. - BENIGN COLORECTAL MUCOSA WITHOUT DYSPLASIA, ONE FRAGMENT. - SEE COMMENT. Microscopic Comment After routine specimen processing, three fragments are identified. One of the fragments demonstrates a serrated horizontally situated crypt, which is suggestive of superficial sampling of a sessile serrated polyp/adenoma (the differential includes another fragment of hyperplastic polyp). Although this is the case, the finding is very focal and not definitive.    11/09/2022 Genetic Testing   Negative genetics for [facility] Multi-Cancer +RNA Panel.  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 Report date is 11/09/2022.   The Multi-Cancer + RNA Panel offered by [facility] includes sequencing and/or deletion/duplication analysis of the following 70 genes:  AIP*, ALK, APC*, ATM*, AXIN2*, BAP1*, BARD1*, BLM*, BMPR1A*, BRCA1*, BRCA2*,  BRIP1*, CDC73*, CDH1*, CDK4, CDKN1B*, CDKN2A, CHEK2*, CTNNA1*, DICER1*, EPCAM (del/dup only), EGFR, FH*, FLCN*, GREM1 (promoter dup only), HOXB13,  KIT, LZTR1, MAX*, MBD4, MEN1*, MET, MITF, MLH1*, MSH2*, MSH3*, MSH6*, MUTYH*, NF1*, NF2*, NTHL1*, PALB2*, PDGFRA, PMS2*, POLD1*, POLE*, POT1*, PRKAR1A*, PTCH1*, PTEN*, RAD51C*, RAD51D*, RB1*, RET, SDHA* (sequencing only), SDHAF2*, SDHB*, SDHC*, SDHD*, SMAD4*, SMARCA4*, SMARCB1*, SMARCE1*, STK11*, SUFU*, TMEM127*, TP53*, TSC1*, TSC2*, VHL*. RNA analysis is performed for * genes.    11/09/2022 Genetic Testing   Negative genetics for [facility] Multi-Cancer +RNA Panel.  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 Report date is 11/09/2022.   The Multi-Cancer + RNA Panel offered by [facility] includes sequencing and/or deletion/duplication analysis of the following 70 genes:  AIP*, ALK, APC*, ATM*, AXIN2*, BAP1*, BARD1*, BLM*, BMPR1A*, BRCA1*, BRCA2*, BRIP1*, CDC73*, CDH1*, CDK4, CDKN1B*, CDKN2A, CHEK2*, CTNNA1*, DICER1*, EPCAM (del/dup only), EGFR, FH*, FLCN*, GREM1 (promoter dup only), HOXB13, KIT, LZTR1, MAX*, MBD4, MEN1*, MET, MITF, MLH1*, MSH2*, MSH3*, MSH6*, MUTYH*, NF1*, NF2*, NTHL1*, PALB2*, PDGFRA, PMS2*, POLD1*, POLE*, POT1*, PRKAR1A*, PTCH1*, PTEN*, RAD51C*, RAD51D*, RB1*, RET, SDHA* (sequencing only), SDHAF2*, SDHB*, SDHC*, SDHD*, SMAD4*, SMARCA4*, SMARCB1*, SMARCE1*, STK11*, SUFU*, TMEM127*, TP53*, TSC1*, TSC2*, VHL*. RNA analysis is performed for * genes.    Malignant neoplasm of upper-outer quadrant of left breast in female, estrogen receptor positive ([place])  10/17/2022 Initial Diagnosis   Diffuse bilateral breast pains.  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 Report date is 11/09/2022.   The Multi-Cancer + RNA Panel offered by [facility] includes sequencing and/or deletion/duplication analysis of the following 70 genes:  AIP*, ALK, APC*, ATM*, AXIN2*, BAP1*, BARD1*, BLM*, BMPR1A*, BRCA1*, BRCA2*, BRIP1*, CDC73*, CDH1*, CDK4, CDKN1B*, CDKN2A, CHEK2*, CTNNA1*, DICER1*, EPCAM (del/dup only), EGFR, FH*, FLCN*, GREM1 (promoter dup only), HOXB13, KIT, LZTR1, MAX*, MBD4, MEN1*, MET, MITF, MLH1*, MSH2*, MSH3*, MSH6*, MUTYH*, NF1*, NF2*, NTHL1*, PALB2*, PDGFRA, PMS2*, POLD1*, POLE*, POT1*, PRKAR1A*, PTCH1*, PTEN*, RAD51C*, RAD51D*, RB1*, RET, SDHA* (sequencing only), SDHAF2*, SDHB*, SDHC*, SDHD*, SMAD4*, SMARCA4*, SMARCB1*, SMARCE1*, STK11*, SUFU*, TMEM127*, TP53*, TSC1*, TSC2*, VHL*. RNA analysis is performed for * genes.    11/14/2022 Surgery   Left lumpectomy: Grade 2 ILC with LCIS 3.5 cm, 1/8 lymph node with micrometastases ER 100%, PR 40%, Ki-67 20%, HER2 3+ positive; lymph node prognostic panel: ER 100% PR 20% Ki-67 10%, HER2 2+ by IHC, FISH negative ratio 1.14   Malignant neoplasm of lower-outer quadrant of left breast of female, estrogen receptor positive ([place])  10/30/2022 Initial Diagnosis   Malignant neoplasm of lower-outer quadrant of left breast of female, estrogen receptor positive ([place])   12/27/2022 -  Chemotherapy   Patient is on Treatment Plan : BREAST Docetaxel + Carboplatin + Trastuzumab (TCH) q21d / Trastuzumab q21d       CURRENT THERAPY: TCH  INTERVAL HISTORY: REBEKAN CRANMER 72 y.o. female returns for  Zayley was swollen when she saw Anda Kraft our smc PA on 01/18/2023 and was given a 5 day course of Lasix.  She is down 11 pounds since receiving the fluids.  Since that visit,  however, she has developed right arm erythema and swelling along her IV insertion site.  She is scheduled for port revision on 02/04/2023.    She completed Bactrim DS for UTI yesterday morning.  She is not having any increased urinary symptoms.     Patient Active Problem List   Diagnosis Date Noted   Malignant neoplasm of rectum ([place]) 01/18/2023   Port-A-Cath in place 12/27/2022   Genetic testing 11/12/2022   Malignant neoplasm of lower-outer quadrant of left breast of female, estrogen receptor positive ([place]) 10/30/2022   Malignant neoplasm of upper-outer quadrant of left breast in female, estrogen receptor positive ([place]) 10/23/2022   S/P ORIF (open reduction internal fixation) fracture right ankle 04/11/20 04/21/2020   Closed trimalleolar fracture of right ankle    History of colonic polyps 12/18/2019   Fecal smearing 12/18/2019   Left arm pain 02/04/2019   Rectal bleeding 02/06/2018   Microcytosis 01/01/2017   Essential hypertension 01/02/2016   Change in stool caliber 12/31/2014   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  MEDICAL HISTORY: Past Medical History:  Diagnosis Date   ADHD (attention deficit hyperactivity disorder)    Anemia    Anxiety    Arthritis    Breast cancer ([place]) 10/2022   left breast ILC   Complication of anesthesia    difficulty waking Vincent   Depression    Diabetes mellitus    type 2 DM   Dyspnea    with exertion   Heart murmur    History of rectal cancer 10/04/2011   Hypertension    Overactive bladder    Paraspinal mass 12/29/2014   Rectal cancer ([place]) dx'd 11/16/09   xrt comp 01/2010; chemo comp 01/2011   Tubular adenoma of colon 01/08/2014    SURGICAL HISTORY: Past Surgical History:  Procedure Laterality Date   ABDOMINAL  HYSTERECTOMY     for uterine fibroids   BREAST LUMPECTOMY WITH RADIOACTIVE SEED LOCALIZATION Left 11/14/2022   Procedure: LEFT BREAST RADIOACTIVE SEED GUIDED LUMPECTOMY x2;  Surgeon: Coralie Keens, MD;  Location: [place];  Service: General;  Laterality: Left;   COLON SURGERY N/A 04/24/2010   "took out half rectum"; rectal cancer   EYE SURGERY     lasik surgery   HAND SURGERY     carpal tunnel- left   HEMORRHOID SURGERY N/A 1980   ORIF ANKLE FRACTURE Right 04/11/2020   Procedure: OPEN REDUCTION INTERNAL FIXATION (ORIF) ANKLE FRACTURE;  Surgeon: Carole Civil, MD;  Location: [facility];  Service: Orthopedics;  Laterality: Right;   PORT-A-CATH REMOVAL N/A 02/17/2019   Procedure: REMOVAL PORT-A-CATH;  Surgeon: Coralie Keens, MD;  Location: [facility];  Service: General;  Laterality: N/A;   PORTACATH PLACEMENT     2011   PORTACATH PLACEMENT N/A 12/17/2022   Procedure: INSERTION PORT-A-CATH;  Surgeon: Coralie Keens, MD;  Location: [place];  Service: General;  Laterality: N/A;   RECTAL SURGERY     2010   SENTINEL NODE BIOPSY Left 11/14/2022   Procedure: SENTINEL LYMPH NODE BIOPSY;  Surgeon: Coralie Keens, MD;  Location: [place];  Service: General;  Laterality:  Left;   WISDOM TOOTH EXTRACTION      SOCIAL HISTORY: Social History   Socioeconomic History   Marital status: Single    Spouse name: Not on file   Number of children: Not on file   Years of education: Not on file   Highest education level: Not on file  Occupational History   Not on file  Tobacco Use   Smoking status: Former    Packs/day: 0.50    Years: 35.00    Total pack years: 17.50    Types: Cigarettes, Cigars    Quit date: 12/03/2009    Years since quitting: 13.1   Smokeless tobacco: Never  Vaping Use   Vaping Use: Never used  Substance and Sexual Activity   Alcohol use: Yes    Alcohol/week: 7.0 standard drinks of alcohol    Types: 7 Cans of beer per week    Comment:  socially   Drug use: No   Sexual activity: Not Currently    Birth control/protection: Surgical    Comment: Hyst  Other Topics Concern   Not on file  Social History Narrative   Not on file   Social Determinants of Health   Financial Resource Strain: Not on file  Food Insecurity: Not on file  Transportation Needs: Not on file  Physical Activity: Not on file  Stress: Not on file  Social Connections: Not on file  Intimate Partner Violence: Not on file    FAMILY HISTORY: Family History  Problem Relation Age of Onset   Leukemia Father 71   Breast cancer Maternal Aunt        dx > 63; mother's maternal half sister   Kidney cancer Maternal Uncle        dx > 74   Breast cancer Paternal Aunt        dx > 105   Breast cancer Cousin        dx > 80; paternal female cousin   Mesothelioma Cousin        paternal female cousin   Colon cancer Neg Hx    Esophageal cancer Neg Hx    Stomach cancer Neg Hx    Rectal cancer Neg Hx     Review of Systems - Oncology    PHYSICAL EXAMINATION  ECOG PERFORMANCE STATUS: {CHL ONC ECOG [id number]  Vitals:   01/29/23 1229  BP: (!) 144/76  Pulse: 92  Resp: 18  Temp: 97.9 F (36.6 C)  SpO2: 98%    Physical Exam  LABORATORY DATA:  CBC    Component Value Date/Time   WBC 9.2 01/18/2023 1340   WBC 6.0 05/27/2020 1443   RBC 3.90 01/18/2023 1340   HGB 10.6 (L) 01/18/2023 1340   HGB 12.6 01/01/2017 0841   HCT 32.5 (L) 01/18/2023 1340   HCT 39.2 01/01/2017 0841   PLT 310 01/18/2023 1340   PLT 301 01/01/2017 0841   MCV 83.3 01/18/2023 1340   MCV 78.7 (L) 01/01/2017 0841   MCH 27.2 01/18/2023 1340   MCHC 32.6 01/18/2023 1340   RDW 18.3 (H) 01/18/2023 1340   RDW 17.9 (H) 01/01/2017 0841   LYMPHSABS 0.3 (L) 01/18/2023 1340   LYMPHSABS 1.3 01/01/2017 0841   MONOABS 0.4 01/18/2023 1340   MONOABS 0.5 01/01/2017 0841   EOSABS 0.0 01/18/2023 1340   EOSABS 0.1 01/01/2017 0841   BASOSABS 0.0 01/18/2023 1340   BASOSABS 0.0 01/01/2017  0841    CMP     Component Value Date/Time   NA 138 01/18/2023  1340   NA 141 01/01/2017 0841   K 4.1 01/18/2023 1340   K 3.7 01/01/2017 0841   CL 105 01/18/2023 1340   CL 99 11/05/2012 0842   CO2 25 01/18/2023 1340   CO2 25 01/01/2017 0841   GLUCOSE 231 (H) 01/18/2023 1340   GLUCOSE 106 01/01/2017 0841   GLUCOSE 167 (H) 11/05/2012 0842   BUN 20 01/18/2023 1340   BUN 18.2 01/01/2017 0841   CREATININE 0.86 01/18/2023 1340   CREATININE 1.3 (H) 01/01/2017 0841   CALCIUM 8.0 (L) 01/18/2023 1340   CALCIUM 9.5 01/01/2017 0841   PROT 6.6 01/18/2023 1340   PROT 7.5 01/01/2017 0841   ALBUMIN 3.9 01/18/2023 1340   ALBUMIN 4.0 01/01/2017 0841   AST 25 01/18/2023 1340   AST 13 01/01/2017 0841   ALT 54 (H) 01/18/2023 1340   ALT 16 01/01/2017 0841   ALKPHOS 89 01/18/2023 1340   ALKPHOS 89 01/01/2017 0841   BILITOT 0.3 01/18/2023 1340   BILITOT 0.29 01/01/2017 0841   GFRNONAA >60 01/18/2023 1340   GFRAA >60 05/27/2020 1443   GFRAA 52 (L) 01/12/2019 1330       PENDING LABS:   RADIOGRAPHIC STUDIES:  No results found.   PATHOLOGY:     ASSESSMENT and THERAPY PLAN:   No problem-specific Assessment & Plan notes found for this encounter.   No orders of the defined types were placed in this encounter.   All questions were answered. The patient knows to call the clinic with any problems, questions or concerns. We can certainly see the patient much sooner if necessary. This note was electronically signed. Scot Dock, NP 01/29/2023

## 2023-01-29 NOTE — Assessment & Plan Note (Signed)
10/17/2022 Diffuse bilateral breast pains.  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) no distant metastatic disease    Treatment plan: Adjuvant chemotherapy with TCH followed by Herceptin maintenance Adjuvant radiation Adjuvant antiestrogen therapy --------------------------------------------------------------------------------------------------------------------- Current Treatment: Cycle 2 TCH Chemo toxicities: Bone pain from Neulasta Diarrhea: now controlled with imodium Abd discomfort and dec appetite Mild Nausea: takes Compazine   Right neck swelling and pain with redness of the right forearm: DVT involving right jugular right subclavian vein as well as superficial vein on the arm, complains of slight shortness of breath.  Recommended anticoagulation with Xarelto and I sent a new prescription today.   Plan: We will postpone her chemotherapy which is scheduled for next week by 3 weeks.  This will enable her to respond to the anticoagulation when she comes back we will reaccess her port.  If we are successful then she can keep the port.  I sent a message to Dr. Ninfa Linden regarding this.  Return to clinic for cycle 3 of chemo at the end of March.

## 2023-01-29 NOTE — Progress Notes (Signed)
Patient Care Team: [name], MD as PCP - General (Family Medicine) [name], MD as Consulting Physician (Hematology and Oncology) Rockwell Germany, RN as Oncology Nurse Navigator Mauro Kaufmann, RN as Oncology Nurse Navigator Nicholas Lose, MD as Consulting Physician (Hematology and Oncology) Coralie Keens, MD as Consulting Physician (General Surgery) Gery Pray, MD as Consulting Physician (Radiation Oncology)  DIAGNOSIS:  Encounter Diagnoses  Name Primary?   Malignant neoplasm of lower-outer quadrant of left breast of female, estrogen receptor positive ([place]) Yes   Malignant neoplasm of upper-outer quadrant of left breast in female, estrogen receptor positive ([place])     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 after neoadjuvant chemotherapy and radiation therapy   Primary site: Colon and Rectum   Staging method: AJCC 7th Edition   Clinical: Stage I (T2, N0, M0) signed by [name], MD on 12/28/2013  1:49 PM   Pathologic: Stage I (T2, N0, cM0) signed by [name], MD on 12/28/2013  1:49 PM   Summary: Stage I (T2, N0, cM0)     History of rectal cancer  11/17/2009 Procedure   Colonoscopy and rectal biopsy confirmed moderately differentiated adenocarcinoma   11/18/2009 Imaging   Transrectal ultrasound place staging T3 lesion   11/18/2009 Imaging   Staging CT scan of the chest, abdomen and pelvis showed multiple lesions in the liver as well as paraspinal mass of unknown etiology   12/05/2009 - 04/03/2010 Chemotherapy   The patient completed new adjuvant chemotherapy with 5-FU and radiation therapy   12/14/2009 Imaging   PET CT scan show faint metabolic activity in the paraspinal mass with no activity in the liver   04/24/2010 Surgery   She had surgical resection with negative margins. Final pathology was T2, N0, M0 (down staged by chemoradiation therapy from T3, N0, M0)   11/19/2012  Imaging   MRI of the liver confirmed benign hemangioma   01/08/2014 Procedure   Colonoscopy and biopsy was negative   12/21/2014 Imaging   CT scan showed no recurrence of colon cancer. Incidentally, paraspinal mass is slightly larger.   10/04/2016 Procedure   She had repeat colonoscopy which showed two 3 mm polyps in the transverse colon, removed with a cold snare. Resected and retrieved. The examination was otherwise normal on direct and retroflexion views.   10/04/2016 Pathology Results   Surgical [P], transverse, polyp(s) - SERRATED POLYP WITH FOCAL FEATURES SUGGESTIVE OF SESSILE SERRATED POLYP/ADENOMA, ONE FRAGMENT. - HYPERPLASTIC POLYP WITHOUT DYSPLASIA, ONE FRAGMENT. - BENIGN COLORECTAL MUCOSA WITHOUT DYSPLASIA, ONE FRAGMENT. - SEE COMMENT. Microscopic Comment After routine specimen processing, three fragments are identified. One of the fragments demonstrates a serrated horizontally situated crypt, which is suggestive of superficial sampling of a sessile serrated polyp/adenoma (the differential includes another fragment of hyperplastic polyp). Although this is the case, the finding is very focal and not definitive.    11/09/2022 Genetic Testing   Negative genetics for [facility] Multi-Cancer +RNA Panel.  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 Report date is 11/09/2022.   The Multi-Cancer + RNA Panel offered by [facility] includes sequencing and/or deletion/duplication analysis of the following 70 genes:  AIP*, ALK, APC*, ATM*, AXIN2*, BAP1*, BARD1*, BLM*, BMPR1A*, BRCA1*, BRCA2*, BRIP1*, CDC73*, CDH1*, CDK4, CDKN1B*, CDKN2A, CHEK2*, CTNNA1*, DICER1*, EPCAM (del/dup only), EGFR, FH*, FLCN*, GREM1 (promoter dup only), HOXB13, KIT, LZTR1, MAX*, MBD4, MEN1*, MET, MITF, MLH1*, MSH2*, MSH3*, MSH6*, MUTYH*, NF1*, NF2*, NTHL1*, PALB2*, PDGFRA, PMS2*, POLD1*, POLE*,  POT1*, PRKAR1A*, PTCH1*, PTEN*, RAD51C*, RAD51D*, RB1*, RET, SDHA* (sequencing only), SDHAF2*, SDHB*, SDHC*, SDHD*,  SMAD4*, SMARCA4*, SMARCB1*, SMARCE1*, STK11*, SUFU*, TMEM127*, TP53*, TSC1*, TSC2*, VHL*. RNA analysis is performed for * genes.    11/09/2022 Genetic Testing   Negative genetics for [facility] Multi-Cancer +RNA Panel.  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 Report date is 11/09/2022.   The Multi-Cancer + RNA Panel offered by [facility] includes sequencing and/or deletion/duplication analysis of the following 70 genes:  AIP*, ALK, APC*, ATM*, AXIN2*, BAP1*, BARD1*, BLM*, BMPR1A*, BRCA1*, BRCA2*, BRIP1*, CDC73*, CDH1*, CDK4, CDKN1B*, CDKN2A, CHEK2*, CTNNA1*, DICER1*, EPCAM (del/dup only), EGFR, FH*, FLCN*, GREM1 (promoter dup only), HOXB13, KIT, LZTR1, MAX*, MBD4, MEN1*, MET, MITF, MLH1*, MSH2*, MSH3*, MSH6*, MUTYH*, NF1*, NF2*, NTHL1*, PALB2*, PDGFRA, PMS2*, POLD1*, POLE*, POT1*, PRKAR1A*, PTCH1*, PTEN*, RAD51C*, RAD51D*, RB1*, RET, SDHA* (sequencing only), SDHAF2*, SDHB*, SDHC*, SDHD*, SMAD4*, SMARCA4*, SMARCB1*, SMARCE1*, STK11*, SUFU*, TMEM127*, TP53*, TSC1*, TSC2*, VHL*. RNA analysis is performed for * genes.    Malignant neoplasm of upper-outer quadrant of left breast in female, estrogen receptor positive ([place])  10/17/2022 Initial Diagnosis   Diffuse bilateral breast pains.  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  Variant of uncertain significance in KIT at  c.839C>T (p.Ala280Val). Report date is 11/09/2022.   The Multi-Cancer + RNA Panel offered by [facility] includes sequencing and/or deletion/duplication analysis of the following 70 genes:  AIP*, ALK, APC*, ATM*, AXIN2*, BAP1*, BARD1*, BLM*, BMPR1A*, BRCA1*, BRCA2*, BRIP1*, CDC73*, CDH1*, CDK4, CDKN1B*, CDKN2A, CHEK2*, CTNNA1*, DICER1*, EPCAM (del/dup only), EGFR, FH*, FLCN*, GREM1 (promoter dup only), HOXB13, KIT, LZTR1, MAX*, MBD4, MEN1*, MET, MITF, MLH1*, MSH2*, MSH3*, MSH6*, MUTYH*, NF1*, NF2*, NTHL1*, PALB2*, PDGFRA, PMS2*, POLD1*, POLE*, POT1*, PRKAR1A*, PTCH1*, PTEN*, RAD51C*, RAD51D*, RB1*, RET, SDHA* (sequencing only), SDHAF2*, SDHB*, SDHC*, SDHD*, SMAD4*, SMARCA4*, SMARCB1*, SMARCE1*, STK11*, SUFU*, TMEM127*, TP53*, TSC1*, TSC2*, VHL*. RNA analysis is performed for * genes.    11/14/2022 Surgery   Left lumpectomy: Grade 2 ILC with LCIS 3.5 cm, 1/8 lymph node with micrometastases ER 100%, PR 40%, Ki-67 20%, HER2 3+ positive; lymph node prognostic panel: ER 100% PR 20% Ki-67 10%, HER2 2+ by IHC, FISH negative ratio 1.14   Malignant neoplasm of lower-outer quadrant of left breast of female, estrogen receptor positive ([place])  10/30/2022 Initial Diagnosis   Malignant neoplasm of lower-outer quadrant of left breast of female, estrogen receptor positive ([place])   12/27/2022 -  Chemotherapy   Patient is on Treatment Plan : BREAST Docetaxel + Carboplatin + Trastuzumab (TCH) q21d / Trastuzumab q21d       CHIEF COMPLIANT: Follow-up neck swelling/blood clot  INTERVAL HISTORY: Tonya Vincent is a 72 y.o. female is here because of recent diagnosis of left breast cancer. She presents to the clinic for a follow-up neck swelling and blood clot. She states that she noticed the right side neck is more puffed up. She says she  has been having shorting of breath.  ALLERGIES:  is allergic to advanced hand sanitizer [alcohol], norgesic [orphenadrine-aspirin-caffeine], hydrocodone, oxycodone, and antibacterial hand  soap [triclosan].  MEDICATIONS:  Current Outpatient Medications  Medication Sig Dispense Refill   acetaminophen-codeine (TYLENOL #3) 300-30 MG tablet Take 1 tablet by mouth every 6 (six) hours as needed for moderate pain. 25 tablet 0   amLODipine (NORVASC) 10 MG tablet Take 10 mg by mouth at bedtime.      Aromatic Inhalants (VICKS VAPOINHALER IN) Inhale 1 puff into the lungs daily as needed (congestion).     aspirin EC 81 MG tablet Take 81 mg by mouth at bedtime.      atorvastatin (LIPITOR) 10 MG tablet Take 10 mg by mouth daily.     docusate sodium (COLACE) 100 MG capsule Take 100-400 mg by mouth daily as needed for mild constipation.     empagliflozin (JARDIANCE) 25 MG TABS tablet Take 25 mg by mouth daily with supper.      ibuprofen (ADVIL) 800 MG tablet Take 800 mg by mouth every 8 (eight) hours as needed for moderate pain.     latanoprost (XALATAN) 0.005 % ophthalmic solution Place 1 drop into both eyes at bedtime.      loperamide (IMODIUM A-D) 2 MG tablet Take 2 mg by mouth 4 (four) times daily as needed for diarrhea or loose stools.     loratadine (CLARITIN REDITABS) 10 MG dissolvable tablet Take 10 mg by mouth daily.     olmesartan (BENICAR) 40 MG tablet Take 40 mg by mouth daily.     prochlorperazine (COMPAZINE) 10 MG tablet Take 1 tablet (10 mg total) by mouth every 6 (six) hours as needed for nausea or vomiting. 30 tablet 1   RIVAROXABAN (XARELTO) VTE STARTER PACK (15 & 20 MG) Follow package directions: Take one '15mg'$  tablet by mouth twice a day. On day 22, switch to one '20mg'$  tablet once a day. Take with food. 51 each 0   Semaglutide, 1 MG/DOSE, 2 MG/1.5ML SOPN Inject 1 mg into the skin every Sunday.     spironolactone (ALDACTONE) 25 MG tablet Take 50 mg by mouth daily.     vortioxetine HBr (TRINTELLIX) 10 MG TABS tablet Take 10 mg by mouth daily with supper.     dexamethasone (DECADRON) 4 MG tablet Take 1 tablet (4 mg total) by mouth daily. Take 1 tablet day before chemo and 1 tablet  day after chemo with food 8 tablet 0   furosemide (LASIX) 20 MG tablet Take 1 tablet (20 mg total) by mouth 2 (two) times daily for 5 days. 10 tablet 0   lidocaine-prilocaine (EMLA) cream Apply to affected area once 30 g 3   ondansetron (ZOFRAN) 8 MG tablet Take 1 tablet (8 mg total) by mouth every 8 (eight) hours as needed for nausea or vomiting. Start on the third day after chemotherapy. 30 tablet 1   No current facility-administered medications for this visit.    PHYSICAL EXAMINATION: ECOG PERFORMANCE STATUS: 1 - Symptomatic but completely ambulatory  There were no vitals filed for this visit. There were no vitals filed for this visit.    LABORATORY DATA:  I have reviewed the data as listed    Latest Ref Rng & Units 01/18/2023    1:40 PM 01/17/2023   10:28 AM 01/03/2023    2:01 PM  CMP  Glucose 70 - 99 mg/dL 231  142  150   BUN 8 - 23 mg/dL 20  13  19  Creatinine 0.44 - 1.00 mg/dL 0.86  0.89  1.16   Sodium 135 - 145 mmol/L 138  141  131   Potassium 3.5 - 5.1 mmol/L 4.1  3.8  5.0   Chloride 98 - 111 mmol/L 105  107  99   CO2 22 - 32 mmol/L '25  28  23   '$ Calcium 8.9 - 10.3 mg/dL 8.0  8.6  10.4   Total Protein 6.5 - 8.1 g/dL 6.6  6.4  7.5   Total Bilirubin 0.3 - 1.2 mg/dL 0.3  0.2  0.3   Alkaline Phos 38 - 126 U/L 89  87  104   AST 15 - 41 U/L '25  19  12   '$ ALT 0 - 44 U/L 54  38  16     Lab Results  Component Value Date   WBC 9.2 01/18/2023   HGB 10.6 (L) 01/18/2023   HCT 32.5 (L) 01/18/2023   MCV 83.3 01/18/2023   PLT 310 01/18/2023   NEUTROABS 8.3 (H) 01/18/2023    ASSESSMENT & PLAN:  Malignant neoplasm of upper-outer quadrant of left breast in female, estrogen receptor positive ([place]) 10/17/2022 Diffuse bilateral breast pains.  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) no distant metastatic disease    Treatment plan: Adjuvant chemotherapy with TCH followed by Herceptin maintenance Adjuvant radiation Adjuvant antiestrogen therapy --------------------------------------------------------------------------------------------------------------------- Current Treatment: Cycle 2 TCH Chemo toxicities: Bone pain from Neulasta Diarrhea: now controlled with imodium Abd discomfort and dec appetite Mild Nausea: takes Compazine   Right neck swelling and pain with redness of the right forearm: DVT involving right jugular right subclavian vein as well as superficial vein on the arm, complains of slight shortness of breath.  Recommended anticoagulation with Xarelto and I sent a new prescription today.   Plan: We will postpone her chemotherapy which is scheduled for next week by 3 weeks.  This will enable her to respond to the anticoagulation when she comes back we will reaccess her port.  If we are successful then she can keep the port.  I sent a message to Dr. Ninfa Linden regarding this.  Return to clinic for cycle 3 of chemo at the end of March.    Orders Placed This Encounter  Procedures   CBC with Differential ([place] Only)    Standing Status:   Future    Standing Expiration Date:   02/29/2024   CMP ([place] only)    Standing Status:   Future    Standing Expiration Date:   02/29/2024   The patient has a good understanding of the overall plan. she agrees with it. she will call with any problems that may develop before the next visit here. Total  time spent: 30 mins including face to face time and time spent for planning, charting and co-ordination of care   Harriette Ohara, MD 01/29/23  I Gardiner Coins am acting as a Education administrator for [employer]  I have reviewed the above documentation for accuracy and completeness, and I agree with the above.

## 2023-01-29 NOTE — Progress Notes (Signed)
Right upper extremity venous duplex has been completed. Preliminary results can be found in CV Proc through chart review.  Results were given to Mickel Baas at [facility] NP office.  01/29/23 2:06 PM Tonya Vincent RVT

## 2023-01-30 ENCOUNTER — Other Ambulatory Visit: Payer: Self-pay

## 2023-01-30 ENCOUNTER — Encounter (HOSPITAL_COMMUNITY): Payer: Self-pay

## 2023-01-30 ENCOUNTER — Emergency Department (HOSPITAL_COMMUNITY)
Admission: EM | Admit: 2023-01-30 | Discharge: 2023-01-30 | Disposition: A | Discharge status: Home / Self Care | Payer: Medicare HMO | Attending: Emergency Medicine | Admitting: Emergency Medicine

## 2023-01-30 ENCOUNTER — Encounter: Payer: Self-pay | Admitting: Hematology and Oncology

## 2023-01-30 DIAGNOSIS — Z7982 Long term (current) use of aspirin: Secondary | ICD-10-CM | POA: Insufficient documentation

## 2023-01-30 DIAGNOSIS — Z7901 Long term (current) use of anticoagulants: Secondary | ICD-10-CM | POA: Diagnosis not present

## 2023-01-30 DIAGNOSIS — C50912 Malignant neoplasm of unspecified site of left female breast: Secondary | ICD-10-CM | POA: Diagnosis not present

## 2023-01-30 DIAGNOSIS — R040 Epistaxis: Secondary | ICD-10-CM

## 2023-01-30 LAB — PROTIME-INR
INR: 1 (ref 0.8–1.2)
Prothrombin Time: 13.3 seconds (ref 11.4–15.2)

## 2023-01-30 LAB — TYPE AND SCREEN
ABO/RH(D): B POS
Antibody Screen: NEGATIVE

## 2023-01-30 LAB — BASIC METABOLIC PANEL
Anion gap: 8 (ref 5–15)
BUN: 13 mg/dL (ref 8–23)
CO2: 22 mmol/L (ref 22–32)
Calcium: 8.4 mg/dL — ABNORMAL LOW (ref 8.9–10.3)
Chloride: 106 mmol/L (ref 98–111)
Creatinine, Ser: 1.2 mg/dL — ABNORMAL HIGH (ref 0.44–1.00)
GFR, Estimated: 49 mL/min — ABNORMAL LOW (ref >60)
Glucose, Bld: 156 mg/dL — ABNORMAL HIGH (ref 70–99)
Potassium: 4.3 mmol/L (ref 3.5–5.1)
Sodium: 136 mmol/L (ref 135–145)

## 2023-01-30 LAB — CBC WITH DIFFERENTIAL/PLATELET
Abs Immature Granulocytes: 0.46 10*3/uL — ABNORMAL HIGH (ref 0.00–0.07)
Basophils Absolute: 0.1 10*3/uL (ref 0.0–0.1)
Basophils Relative: 1 %
Eosinophils Absolute: 0 10*3/uL (ref 0.0–0.5)
Eosinophils Relative: 0 %
HCT: 35.1 % — ABNORMAL LOW (ref 36.0–46.0)
Hemoglobin: 10.9 g/dL — ABNORMAL LOW (ref 12.0–15.0)
Immature Granulocytes: 5 %
Lymphocytes Relative: 19 %
Lymphs Abs: 1.9 10*3/uL (ref 0.7–4.0)
MCH: 26.7 pg (ref 26.0–34.0)
MCHC: 31.1 g/dL (ref 30.0–36.0)
MCV: 85.8 fL (ref 80.0–100.0)
Monocytes Absolute: 0.9 10*3/uL (ref 0.1–1.0)
Monocytes Relative: 9 %
Neutro Abs: 6.9 10*3/uL (ref 1.7–7.7)
Neutrophils Relative %: 66 %
Platelets: 239 10*3/uL (ref 150–400)
RBC: 4.09 MIL/uL (ref 3.87–5.11)
RDW: 19.5 % — ABNORMAL HIGH (ref 11.5–15.5)
WBC: 10.2 10*3/uL (ref 4.0–10.5)
nRBC: 0.3 % — ABNORMAL HIGH (ref 0.0–0.2)

## 2023-01-30 MED ORDER — OXYMETAZOLINE HCL 0.05 % NA SOLN
2.0000 | Freq: Once | NASAL | Status: AC
  Administered 2023-01-30: 2 via NASAL
  Filled 2023-01-30: qty 30

## 2023-01-30 MED ORDER — LIDOCAINE-EPINEPHRINE 2 %-1:100000 IJ SOLN
5.0000 mL | Freq: Once | INTRAMUSCULAR | Status: DC
  Filled 2023-01-30: qty 1

## 2023-01-30 MED ORDER — TRANEXAMIC ACID FOR EPISTAXIS
500.0000 mg | Freq: Once | TOPICAL | Status: DC
  Filled 2023-01-30 (×2): qty 10

## 2023-01-30 NOTE — Assessment & Plan Note (Addendum)
Tonya Vincent is a 72 year old woman with stage IA triple positive breast cancer here for f/u after her second cycle of [place] for right arm swelling and weight evaluation.  Her swelling is much improved since completing the lasix.  I am concerned about her right arm swelling /discoloration and the possibility for DVT. I ordered a stat doppler.  If it is positive we will see her back here in clinic to discuss anticoagulants.

## 2023-01-30 NOTE — ED Triage Notes (Signed)
Pt states that she has had a nosebleed since 7 pm. Pt states that she was supposed to start Xarelto today for a nosebleed.

## 2023-01-30 NOTE — ED Provider Notes (Addendum)
[place] AT [facility] Provider Note   CSN: [id number] Arrival date & time: 01/30/23  2101     History  Chief Complaint  Patient presents with   Epistaxis    Tonya Vincent is a 72 y.o. female.  With PMH of newly diagnosed left breast cancer on chemotherapy last 01/17/23 as well as recent new diagnosis of DVT of right jugular right subclavian vein and superficial vein on the arm supposed to be starting Xarelto today presenting with epistaxis since 7 pm today.  Patient has been having bleeding out of the left nare since 7 PM today.  She has been holding constant pressure and has had multiple clots come out of her nose.  She has not started her Xarelto yet.  She is only on aspirin.  She has never had issues with nosebleed before.  She felt like she had an itch in her nose and blowing on it hard and then it started bleeding.  It has not stopped since.  She has not used Afrin or any other medications.  She has not been having any difficulty breathing, lightheadedness or faintness.   Epistaxis      Home Medications Prior to Admission medications   Medication Sig Start Date End Date Taking? Authorizing Provider  acetaminophen-codeine (TYLENOL #3) 300-30 MG tablet Take 1 tablet by mouth every 6 (six) hours as needed for moderate pain. 12/17/22   Coralie Keens, MD  amLODipine (NORVASC) 10 MG tablet Take 10 mg by mouth at bedtime.  01/24/19   [provider]  Aromatic Inhalants (VICKS VAPOINHALER IN) Inhale 1 puff into the lungs daily as needed (congestion).    [provider]  aspirin EC 81 MG tablet Take 81 mg by mouth at bedtime.     [provider]  atorvastatin (LIPITOR) 10 MG tablet Take 10 mg by mouth daily. 09/30/22   [provider]  dexamethasone (DECADRON) 4 MG tablet Take 1 tablet (4 mg total) by mouth daily. Take 1 tablet day before chemo and 1 tablet day after chemo with food 12/06/22   Nicholas Lose, MD   docusate sodium (COLACE) 100 MG capsule Take 100-400 mg by mouth daily as needed for mild constipation.    [provider]  empagliflozin (JARDIANCE) 25 MG TABS tablet Take 25 mg by mouth daily with supper.     [provider]  furosemide (LASIX) 20 MG tablet Take 1 tablet (20 mg total) by mouth 2 (two) times daily for 5 days. 01/18/23 01/23/23  Barrie Folk, PA-C  ibuprofen (ADVIL) 800 MG tablet Take 800 mg by mouth every 8 (eight) hours as needed for moderate pain.    [provider]  latanoprost (XALATAN) 0.005 % ophthalmic solution Place 1 drop into both eyes at bedtime.     [provider]  lidocaine-prilocaine (EMLA) cream Apply to affected area once 12/06/22   Nicholas Lose, MD  loperamide (IMODIUM A-D) 2 MG tablet Take 2 mg by mouth 4 (four) times daily as needed for diarrhea or loose stools.    [provider]  loratadine (CLARITIN REDITABS) 10 MG dissolvable tablet Take 10 mg by mouth daily.    [provider]  olmesartan (BENICAR) 40 MG tablet Take 40 mg by mouth daily. 02/28/21   [provider]  ondansetron (ZOFRAN) 8 MG tablet Take 1 tablet (8 mg total) by mouth every 8 (eight) hours as needed for nausea or vomiting. Start on the third day after chemotherapy.  12/06/22   Nicholas Lose, MD  prochlorperazine (COMPAZINE) 10 MG tablet Take 1 tablet (10 mg total) by mouth every 6 (six) hours as needed for nausea or vomiting. 12/06/22   Nicholas Lose, MD  RIVAROXABAN [employer]) VTE STARTER PACK (15 & 20 MG) Follow package directions: Take one '15mg'$  tablet by mouth twice a day. On day 22, switch to one '20mg'$  tablet once a day. Take with food. 01/29/23   Nicholas Lose, MD  Semaglutide, 1 MG/DOSE, 2 MG/1.5ML SOPN Inject 1 mg into the skin every Sunday.    [provider]  spironolactone (ALDACTONE) 25 MG tablet Take 50 mg by mouth daily. 02/28/21   [provider]  vortioxetine HBr (TRINTELLIX) 10 MG TABS tablet Take 10  mg by mouth daily with supper.    [provider]      Allergies    Advanced hand sanitizer [alcohol], Norgesic [orphenadrine-aspirin-caffeine], Hydrocodone, Oxycodone, and Antibacterial hand soap [triclosan]    Review of Systems   Review of Systems  HENT:  Positive for nosebleeds.     Physical Exam Updated Vital Signs BP (!) 164/89   Pulse (!) 104   Temp 97.9 F (36.6 C) (Oral)   Resp 16   SpO2 96%  Physical Exam Constitutional: Alert and oriented. Well appearing and in no distress. Eyes: Conjunctivae are normal. ENT      Head: Normocephalic and atraumatic.      Nose: Active left nare epistaxis with large clots, no obvious lesion, septum intact. No drainage from right nare.      Mouth/Throat: Mucous membranes are moist.      Neck: No stridor. No tripoding Cardiovascular: S1, S2,  Normal and symmetric distal pulses are present in all extremities.Warm and well perfused. Respiratory: Normal respiratory effort. O2 sat 96 on RA Gastrointestinal: nondistended Musculoskeletal: Normal range of motion in all extremities. Neurologic: Normal speech and language. No gross focal neurologic deficits are appreciated. Skin: Skin is warm, dry and intact. No rash noted. Psychiatric: Mood and affect are normal. Speech and behavior are normal.  ED Results / Procedures / Treatments   Labs (all labs ordered are listed, but only abnormal results are displayed) Labs Reviewed  BASIC METABOLIC PANEL - Abnormal; Notable for the following components:      Result Value   Glucose, Bld 156 (*)    Creatinine, Ser 1.20 (*)    Calcium 8.4 (*)    GFR, Estimated 49 (*)    All other components within normal limits  CBC WITH DIFFERENTIAL/PLATELET - Abnormal; Notable for the following components:   Hemoglobin 10.9 (*)    HCT 35.1 (*)    RDW 19.5 (*)    nRBC 0.3 (*)    Abs Immature Granulocytes 0.46 (*)    All other components within normal limits  PROTIME-INR  TYPE AND SCREEN     EKG None  Radiology VAS [place] UPPER EXTREMITY VENOUS DUPLEX  Result Date: 01/29/2023 UPPER VENOUS STUDY  Patient Name:  YASHA URENA  Date of Exam:   01/29/2023 Medical Rec #: [id number]          Accession #:    [id number] Date of Birth: Nov 27, 1951         Patient Gender: F Patient Age:   27 years Exam Location:  [facility] Procedure:      VAS [place] UPPER EXTREMITY VENOUS DUPLEX Referring Phys: Mendel Ryder CAUSEY --------------------------------------------------------------------------------  Indications: Swelling Other Indications: Right subclavian port. Risk Factors: Cancer. Comparison Study: No prior studies. Performing Technologist:  Oliver Hum RVT  Examination Guidelines: A complete evaluation includes B-mode imaging, spectral Doppler, color Doppler, and power Doppler as needed of all accessible portions of each vessel. Bilateral testing is considered an integral part of a complete examination. Limited examinations for reoccurring indications may be performed as noted.  Right Findings: +----------+------------+---------+-----------+----------+-------+ RIGHT     CompressiblePhasicitySpontaneousPropertiesSummary +----------+------------+---------+-----------+----------+-------+ IJV           None       No        No                Acute  +----------+------------+---------+-----------+----------+-------+ Subclavian    None       No        No                Acute  +----------+------------+---------+-----------+----------+-------+ Axillary      Full       Yes       Yes                      +----------+------------+---------+-----------+----------+-------+ Brachial      Full       Yes       Yes                      +----------+------------+---------+-----------+----------+-------+ Radial        Full                                          +----------+------------+---------+-----------+----------+-------+ Ulnar         Full                                           +----------+------------+---------+-----------+----------+-------+ Cephalic      Full                                          +----------+------------+---------+-----------+----------+-------+ Basilic       Full                                          +----------+------------+---------+-----------+----------+-------+ A thrombus is noted in a superficial vein on the posterior aspect of the forearm.  Left Findings: +----------+------------+---------+-----------+----------+-------+ LEFT      CompressiblePhasicitySpontaneousPropertiesSummary +----------+------------+---------+-----------+----------+-------+ Subclavian    Full       Yes       Yes                      +----------+------------+---------+-----------+----------+-------+  Summary:  Right: Findings consistent with acute deep vein thrombosis involving the right internal jugular vein and right subclavian vein. A thrombus is noted in a superficial vein on the posterior aspect of the forearm.  Left: No evidence of thrombosis in the subclavian.  *See table(s) above for measurements and observations.  Diagnosing physician: Deitra Mayo MD Electronically signed by Deitra Mayo MD on 01/29/2023 at 2:52:54 PM.    Final     Procedures Procedures    Medications Ordered in ED Medications  tranexamic acid (CYKLOKAPRON) 1000 MG/10ML topical solution 500 mg (has no  administration in time range)  lidocaine-EPINEPHrine (XYLOCAINE W/EPI) 2 %-1:100000 (with pres) injection 5 mL (has no administration in time range)  oxymetazoline (AFRIN) 0.05 % nasal spray 2 spray (2 sprays Each Nare Given 01/30/23 2214)    ED Course/ Medical Decision Making/ A&P                            Medical Decision Making QADIRA FELLA is a 72 y.o. female.  With PMH of newly diagnosed left breast cancer on chemotherapy last 01/17/23 as well as recent new diagnosis of DVT of right jugular right subclavian vein and superficial  vein on the arm supposed to be starting Xarelto today presenting with epistaxis since 7 pm today.   Patient presents mildly tachycardic otherwise hemodynamically stable with no hypotension.  She continues to have active bleeding from left nare with large clots.  She is not on Xarelto yet there is no anticoagulation requiring reversal at this time.  Airway is intact.  Patient was given nasal Afrin and pledget soaked in lidocaine with epi with resolution of nosebleed.  She was observed for approximately an hour with no return of nosebleed.  Was unable to locate bleed but do suspect anterior with resolution with these interventions.  Her labs were reviewed by me.  Hemoglobin 10.9 improved from baseline.  Normal platelet count 239 and normal INR no evidence of coagulopathy.    Patient was safely discharged home in good condition.  Discussed bleeding precautions and sent home with nasal Afrin.  Discussed holding Xarelto for the next 2 to 3 days but advised patient to discuss this plan with her oncologist.  She is in agreement with plan and safe for discharge.  **I had discussed with ENT doctor Hoshal-we did not have to place a [employer] but if she does return and requires [employer] or admission then likely start patient on antibiotics due to immunocompromise status Augmentin for chemotherapy and discussed with oncology starting anticoagulation for right jugular right subclavian vein DVTs.  Amount and/or Complexity of Data Reviewed Labs: ordered.  Risk OTC drugs. Prescription drug management.    Final Clinical Impression(s) / ED Diagnoses Final diagnoses:  Epistaxis    Rx / DC Orders ED Discharge Orders     None         Elgie Congo, MD 01/30/23 2305    Elgie Congo, MD 01/30/23 2311

## 2023-01-30 NOTE — Discharge Instructions (Addendum)
You were seen for a nosebleed today.  We were able to control it with medications in the ER and pressure.  Use the nasal Afrin supplied to you tomorrow 2 sprays in each nose as well as tonight before going to bed.  If the bleeding restarts, blow out all clots apply the nasal Afrin and hold pressure for 15 minutes.  If bleeding continues come back to the ER.  Try to hold taking aspirin or Xarelto for the next 2 to 3 days.  Call your oncologist tomorrow to discuss if it is safe for you to not take the blood thinners for the next 2 to 3 days.  If your nosebleed returns and you are unable to control it at home.  Go to [facility] as the ear nose and throat doctors are present there.

## 2023-01-31 ENCOUNTER — Encounter: Payer: Self-pay | Admitting: Radiology

## 2023-01-31 ENCOUNTER — Telehealth: Payer: Self-pay | Admitting: *Deleted

## 2023-01-31 NOTE — Telephone Encounter (Signed)
Received call from pt stating she was seen in ED last night for a nose bleed.  Pt states she has not started Xarelto but is taking an 81 mg Asprin daily.  Per MD pt needing to stop Asprin and start Xarelto for right subclavian DVT.  Pt educated and verbalized understanding.

## 2023-02-03 ENCOUNTER — Other Ambulatory Visit: Payer: Self-pay

## 2023-02-04 ENCOUNTER — Ambulatory Visit (HOSPITAL_COMMUNITY): Admission: RE | Admit: 2023-02-04 | Payer: Medicare HMO | Source: Ambulatory Visit | Admitting: Surgery

## 2023-02-04 ENCOUNTER — Encounter (HOSPITAL_COMMUNITY): Admission: RE | Payer: Self-pay | Source: Ambulatory Visit

## 2023-02-04 SURGERY — PORT A CATH REVISION
Anesthesia: General

## 2023-02-05 DIAGNOSIS — C50412 Malignant neoplasm of upper-outer quadrant of left female breast: Secondary | ICD-10-CM | POA: Diagnosis not present

## 2023-02-05 DIAGNOSIS — I808 Phlebitis and thrombophlebitis of other sites: Secondary | ICD-10-CM | POA: Diagnosis not present

## 2023-02-05 DIAGNOSIS — Z170 Estrogen receptor positive status [ER+]: Secondary | ICD-10-CM | POA: Diagnosis not present

## 2023-02-05 DIAGNOSIS — C50512 Malignant neoplasm of lower-outer quadrant of left female breast: Secondary | ICD-10-CM | POA: Diagnosis not present

## 2023-02-05 DIAGNOSIS — E1169 Type 2 diabetes mellitus with other specified complication: Secondary | ICD-10-CM | POA: Diagnosis not present

## 2023-02-05 DIAGNOSIS — C50011 Malignant neoplasm of nipple and areola, right female breast: Secondary | ICD-10-CM | POA: Diagnosis not present

## 2023-02-06 ENCOUNTER — Encounter: Payer: Self-pay | Admitting: *Deleted

## 2023-02-07 ENCOUNTER — Inpatient Hospital Stay: Payer: Medicare HMO | Attending: Hematology and Oncology

## 2023-02-07 ENCOUNTER — Other Ambulatory Visit: Payer: Medicare HMO

## 2023-02-07 ENCOUNTER — Ambulatory Visit: Payer: Medicare HMO | Admitting: Hematology and Oncology

## 2023-02-07 ENCOUNTER — Ambulatory Visit: Payer: Medicare HMO

## 2023-02-07 DIAGNOSIS — Z170 Estrogen receptor positive status [ER+]: Secondary | ICD-10-CM | POA: Insufficient documentation

## 2023-02-07 DIAGNOSIS — Z79899 Other long term (current) drug therapy: Secondary | ICD-10-CM | POA: Insufficient documentation

## 2023-02-07 DIAGNOSIS — I82C11 Acute embolism and thrombosis of right internal jugular vein: Secondary | ICD-10-CM | POA: Insufficient documentation

## 2023-02-07 DIAGNOSIS — Z5111 Encounter for antineoplastic chemotherapy: Secondary | ICD-10-CM | POA: Insufficient documentation

## 2023-02-07 DIAGNOSIS — C50512 Malignant neoplasm of lower-outer quadrant of left female breast: Secondary | ICD-10-CM

## 2023-02-07 DIAGNOSIS — C50412 Malignant neoplasm of upper-outer quadrant of left female breast: Secondary | ICD-10-CM | POA: Insufficient documentation

## 2023-02-07 DIAGNOSIS — Z85048 Personal history of other malignant neoplasm of rectum, rectosigmoid junction, and anus: Secondary | ICD-10-CM | POA: Insufficient documentation

## 2023-02-07 DIAGNOSIS — Z923 Personal history of irradiation: Secondary | ICD-10-CM | POA: Insufficient documentation

## 2023-02-07 DIAGNOSIS — Z7901 Long term (current) use of anticoagulants: Secondary | ICD-10-CM | POA: Insufficient documentation

## 2023-02-07 LAB — RESEARCH LABS

## 2023-02-07 NOTE — Research (Signed)
TRIAL S2205, ICE COMPRESS: RANDOMIZED TRIAL OF LIMB CRYOCOMPRESSION VERSUS CONTINUOUS COMPRESSION VERSUS LOW CYCLIC COMPRESSION FOR THE PREVENTION  OF TAXANE-INDUCED PERIPHERAL NEUROPATHY   Patient arrives today Unaccompanied for the Week 6 assessments.    PROs: Per study protocol, all PROs required for this visit were completed prior to other study activities and completeness has been verified.     LABS:    ADVERSE EVENTS: Patient YASLIN GETZ reports AE as below. Attribution per Dr Lindi Adie.  ADVERSE EVENT LOG:  JANANN CONTOIS [id number]  02/07/2023  Adverse Event Log   Event Grade Onset Date Resolved Date Drug Name Attribution Treatment Comments  Epistaxis Grade 2 01/30/23 01/30/23 N/a Unrelated  Nosebleed that brought patient to ED prior to anticoagulation initiation  Vascular access complication  Grade 3 123XX123 Ongoing N/a Unrelated Anticoagulation                                         EKG ASSESSMENT: EKG assessment is not required for this visit.  NEURO ASSESSMENT: The neuro assessment was completed by this nurse and Carol Ada. Patient ELINDA MO tolerated all testing without complaint.   DISPOSITION: Upon completion off all study requirements, patient was escorted to lab waiting.   The patient was thanked for their time and continued voluntary participation in this study. Patient HELYNE PESCHEL has been provided direct contact information and is encouraged to contact this Nurse for any needs or questions.  Marjie Skiff Renard Caperton, RN, BSN, [facility]  Her  [facility] Clinical Research Nurse [facility] Direct Dial [phone]  Pager [phone] 02/07/2023 9:59 AM

## 2023-02-09 ENCOUNTER — Ambulatory Visit: Payer: Medicare HMO

## 2023-02-15 ENCOUNTER — Other Ambulatory Visit: Payer: Self-pay | Admitting: *Deleted

## 2023-02-15 MED ORDER — RIVAROXABAN 20 MG PO TABS: 20.0000 mg | ORAL_TABLET | Freq: Every day | ORAL | 3 refills | Status: DC

## 2023-02-15 NOTE — Progress Notes (Signed)
Verbal orders received from MD for pt to be sent in a prescription for Xarelto 20 mg tablet p.o daily maintenance dose.  Prescription sent to pharmacy on file, pt educated and verbalized understanding.

## 2023-02-18 ENCOUNTER — Ambulatory Visit: Payer: Medicare HMO | Attending: Surgery

## 2023-02-18 VITALS — Wt 198.0 lb

## 2023-02-18 DIAGNOSIS — Z483 Aftercare following surgery for neoplasm: Secondary | ICD-10-CM | POA: Insufficient documentation

## 2023-02-18 NOTE — Therapy (Signed)
OUTPATIENT PHYSICAL THERAPY SOZO SCREENING NOTE   Patient Name: NALEIGHA RAIMONDI MRN: [id number] DOB:[birth year], 72 y.o., female Today's Date: 02/18/2023  PCP: [name], MD REFERRING PROVIDER: Coralie Keens, MD   PT End of Session - 02/18/23 [phone]     Visit Number 2   # unchanged due to screen only   PT Start Time 0845    PT Stop Time 0851    PT Time Calculation (min) 6 min    Activity Tolerance Patient tolerated treatment well    Behavior During Therapy [facility] for tasks assessed/performed             Past Medical History:  Diagnosis Date   ADHD (attention deficit hyperactivity disorder)    Anemia    Anxiety    Arthritis    Breast cancer ([place]) 10/2022   left breast ILC   Complication of anesthesia    difficulty waking up   Depression    Diabetes mellitus    type 2 DM   Dyspnea    with exertion   Heart murmur    History of rectal cancer 10/04/2011   Hypertension    Overactive bladder    Paraspinal mass 12/29/2014   Rectal cancer ([place]) dx'd 11/16/09   xrt comp 01/2010; chemo comp 01/2011   Tubular adenoma of colon 01/08/2014   Past Surgical History:  Procedure Laterality Date   ABDOMINAL HYSTERECTOMY     for uterine fibroids   BREAST LUMPECTOMY WITH RADIOACTIVE SEED LOCALIZATION Left 11/14/2022   Procedure: LEFT BREAST RADIOACTIVE SEED GUIDED LUMPECTOMY x2;  Surgeon: Coralie Keens, MD;  Location: [place];  Service: General;  Laterality: Left;   COLON SURGERY N/A 04/24/2010   "took out half rectum"; rectal cancer   EYE SURGERY     lasik surgery   HAND SURGERY     carpal tunnel- left   HEMORRHOID SURGERY N/A 1980   ORIF ANKLE FRACTURE Right 04/11/2020   Procedure: OPEN REDUCTION INTERNAL FIXATION (ORIF) ANKLE FRACTURE;  Surgeon: Carole Civil, MD;  Location: [facility];  Service: Orthopedics;  Laterality: Right;   PORT-A-CATH REMOVAL N/A 02/17/2019   Procedure: REMOVAL PORT-A-CATH;  Surgeon: Coralie Keens, MD;  Location: [facility]  OR;  Service: General;  Laterality: N/A;   PORTACATH PLACEMENT     2011   PORTACATH PLACEMENT N/A 12/17/2022   Procedure: INSERTION PORT-A-CATH;  Surgeon: Coralie Keens, MD;  Location: [place];  Service: General;  Laterality: N/A;   RECTAL SURGERY     2010   SENTINEL NODE BIOPSY Left 11/14/2022   Procedure: SENTINEL LYMPH NODE BIOPSY;  Surgeon: Coralie Keens, MD;  Location: [place];  Service: General;  Laterality: Left;   [place] EXTRACTION     Patient Active Problem List   Diagnosis Date Noted   Malignant neoplasm of rectum ([place]) 01/18/2023   Port-A-Cath in place 12/27/2022   Genetic testing 11/12/2022   Malignant neoplasm of lower-outer quadrant of left breast of female, estrogen receptor positive ([place]) 10/30/2022   Malignant neoplasm of upper-outer quadrant of left breast in female, estrogen receptor positive ([place]) 10/23/2022   S/P ORIF (open reduction internal fixation) fracture right ankle 04/11/20 04/21/2020   Closed trimalleolar fracture of right ankle    History of colonic polyps 12/18/2019   Fecal smearing 12/18/2019   Left arm pain 02/04/2019   Rectal bleeding 02/06/2018   Microcytosis 01/01/2017   Essential hypertension 01/02/2016   Change in stool caliber 12/31/2014   Paraspinal mass 12/29/2014   Anemia  in chronic illness 12/29/2014   History of rectal cancer 10/04/2011    REFERRING DIAG: left breast cancer at risk for lymphedema  THERAPY DIAG:  Aftercare following surgery for neoplasm  PERTINENT HISTORY: Patient was diagnosed on 10/12/2022 with 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 It measures 4 mm and 8 mm and is located in the upper outer quadrant. The smaller mass is triple positive with a Ki67 of 40%. The larger mass is ER/PR positive and HER2 negative with a Ki67 of 15% . Pt had left lumpectomy with SLNB on 11/14/2022. 1/8 LN's positive. She will be having chemotherapy with Herceptin maintenance, radiation and anti  estrogens. There is a deep seroma at the lumpectomy site identified by surgeon with no further treatment required at present per MD. Pt is a part time pharmacist   PRECAUTIONS: left UE Lymphedema risk, None  SUBJECTIVE: Pt returns for her first 3 month L-Dex screen. "I've had problems with my port on my Rt arm and I ended up with a clot."  PAIN:  Are you having pain? No  SOZO SCREENING: Patient was assessed today using the SOZO machine to determine the lymphedema index score. This was compared to her baseline score. It was determined that she is within the recommended range when compared to her baseline and no further action is needed at this time. She will continue SOZO screenings. These are done every 3 months for 2 years post operatively followed by every 6 months for 2 years, and then annually.   L-DEX FLOWSHEETS - 02/18/23 0800       L-DEX LYMPHEDEMA SCREENING   Measurement Type Unilateral    L-DEX MEASUREMENT EXTREMITY Upper Extremity    POSITION  Standing    DOMINANT SIDE Right    At Risk Side Left    BASELINE SCORE (UNILATERAL) 2.5    L-DEX SCORE (UNILATERAL) -15.6    VALUE CHANGE (UNILAT) -18.1             ** Pt has contralateral UE edema from blood clot due to complications with her port. This is cause for highly negative number outside normal range. Pt was educated about thisOtelia Limes, PTA 02/18/2023, 8:53 AM

## 2023-02-27 MED FILL — Fosaprepitant Dimeglumine For IV Infusion 150 MG (Base Eq): INTRAVENOUS | Qty: 5 | Status: AC

## 2023-02-28 ENCOUNTER — Inpatient Hospital Stay (HOSPITAL_BASED_OUTPATIENT_CLINIC_OR_DEPARTMENT_OTHER): Payer: Medicare HMO | Admitting: Adult Health

## 2023-02-28 ENCOUNTER — Inpatient Hospital Stay: Payer: Medicare HMO

## 2023-02-28 ENCOUNTER — Ambulatory Visit (HOSPITAL_COMMUNITY)
Admission: RE | Admit: 2023-02-28 | Discharge: 2023-02-28 | Disposition: A | Discharge status: Home / Self Care | Payer: Medicare HMO | Source: Ambulatory Visit | Attending: Adult Health | Admitting: Adult Health

## 2023-02-28 ENCOUNTER — Other Ambulatory Visit: Payer: Medicare HMO

## 2023-02-28 ENCOUNTER — Other Ambulatory Visit: Payer: Self-pay

## 2023-02-28 ENCOUNTER — Encounter: Payer: Self-pay | Admitting: Adult Health

## 2023-02-28 ENCOUNTER — Encounter: Payer: Self-pay | Admitting: Hematology and Oncology

## 2023-02-28 ENCOUNTER — Other Ambulatory Visit: Payer: Self-pay | Admitting: Adult Health

## 2023-02-28 VITALS — BP 182/87 | HR 68 | Temp 98.2°F | Resp 20

## 2023-02-28 VITALS — BP 154/79 | HR 78 | Temp 97.5°F | Resp 18 | Ht 64.0 in | Wt 194.4 lb

## 2023-02-28 DIAGNOSIS — Z170 Estrogen receptor positive status [ER+]: Secondary | ICD-10-CM | POA: Insufficient documentation

## 2023-02-28 DIAGNOSIS — C50512 Malignant neoplasm of lower-outer quadrant of left female breast: Secondary | ICD-10-CM | POA: Insufficient documentation

## 2023-02-28 DIAGNOSIS — Z7901 Long term (current) use of anticoagulants: Secondary | ICD-10-CM | POA: Diagnosis not present

## 2023-02-28 DIAGNOSIS — Z79899 Other long term (current) drug therapy: Secondary | ICD-10-CM | POA: Diagnosis not present

## 2023-02-28 DIAGNOSIS — I82621 Acute embolism and thrombosis of deep veins of right upper extremity: Secondary | ICD-10-CM | POA: Diagnosis not present

## 2023-02-28 DIAGNOSIS — Z95828 Presence of other vascular implants and grafts: Secondary | ICD-10-CM

## 2023-02-28 DIAGNOSIS — C50412 Malignant neoplasm of upper-outer quadrant of left female breast: Secondary | ICD-10-CM | POA: Diagnosis present

## 2023-02-28 DIAGNOSIS — I82409 Acute embolism and thrombosis of unspecified deep veins of unspecified lower extremity: Secondary | ICD-10-CM | POA: Insufficient documentation

## 2023-02-28 DIAGNOSIS — Z5111 Encounter for antineoplastic chemotherapy: Secondary | ICD-10-CM | POA: Diagnosis present

## 2023-02-28 DIAGNOSIS — Z923 Personal history of irradiation: Secondary | ICD-10-CM | POA: Diagnosis not present

## 2023-02-28 DIAGNOSIS — I82C11 Acute embolism and thrombosis of right internal jugular vein: Secondary | ICD-10-CM | POA: Diagnosis not present

## 2023-02-28 DIAGNOSIS — Z85048 Personal history of other malignant neoplasm of rectum, rectosigmoid junction, and anus: Secondary | ICD-10-CM | POA: Diagnosis not present

## 2023-02-28 DIAGNOSIS — Z4682 Encounter for fitting and adjustment of non-vascular catheter: Secondary | ICD-10-CM | POA: Diagnosis not present

## 2023-02-28 LAB — CBC WITH DIFFERENTIAL (CANCER CENTER ONLY)
Abs Immature Granulocytes: 0.01 10*3/uL (ref 0.00–0.07)
Basophils Absolute: 0 10*3/uL (ref 0.0–0.1)
Basophils Relative: 1 %
Eosinophils Absolute: 0 10*3/uL (ref 0.0–0.5)
Eosinophils Relative: 1 %
HCT: 36.6 % (ref 36.0–46.0)
Hemoglobin: 11.6 g/dL — ABNORMAL LOW (ref 12.0–15.0)
Immature Granulocytes: 0 %
Lymphocytes Relative: 19 %
Lymphs Abs: 1.1 10*3/uL (ref 0.7–4.0)
MCH: 27 pg (ref 26.0–34.0)
MCHC: 31.7 g/dL (ref 30.0–36.0)
MCV: 85.1 fL (ref 80.0–100.0)
Monocytes Absolute: 0.7 10*3/uL (ref 0.1–1.0)
Monocytes Relative: 12 %
Neutro Abs: 4.2 10*3/uL (ref 1.7–7.7)
Neutrophils Relative %: 67 %
Platelet Count: 312 10*3/uL (ref 150–400)
RBC: 4.3 MIL/uL (ref 3.87–5.11)
RDW: 18.3 % — ABNORMAL HIGH (ref 11.5–15.5)
WBC Count: 6.1 10*3/uL (ref 4.0–10.5)
nRBC: 0 % (ref 0.0–0.2)

## 2023-02-28 LAB — CMP (CANCER CENTER ONLY)
ALT: 12 U/L (ref 0–44)
AST: 12 U/L — ABNORMAL LOW (ref 15–41)
Albumin: 4.3 g/dL (ref 3.5–5.0)
Alkaline Phosphatase: 95 U/L (ref 38–126)
Anion gap: 9 (ref 5–15)
BUN: 14 mg/dL (ref 8–23)
CO2: 25 mmol/L (ref 22–32)
Calcium: 9.4 mg/dL (ref 8.9–10.3)
Chloride: 105 mmol/L (ref 98–111)
Creatinine: 1.07 mg/dL — ABNORMAL HIGH (ref 0.44–1.00)
GFR, Estimated: 56 mL/min — ABNORMAL LOW (ref >60)
Glucose, Bld: 143 mg/dL — ABNORMAL HIGH (ref 70–99)
Potassium: 3.9 mmol/L (ref 3.5–5.1)
Sodium: 139 mmol/L (ref 135–145)
Total Bilirubin: 0.4 mg/dL (ref 0.3–1.2)
Total Protein: 7.6 g/dL (ref 6.5–8.1)

## 2023-02-28 MED ORDER — TRASTUZUMAB-ANNS CHEMO 150 MG IV SOLR
6.0000 mg/kg | Freq: Once | INTRAVENOUS | Status: AC
  Administered 2023-02-28: 525 mg via INTRAVENOUS
  Filled 2023-02-28: qty 25

## 2023-02-28 MED ORDER — SODIUM CHLORIDE 0.9% FLUSH: 10.0000 mL | INTRAVENOUS | Status: DC | PRN

## 2023-02-28 MED ORDER — SODIUM CHLORIDE 0.9 % IV SOLN
150.0000 mg | Freq: Once | INTRAVENOUS | Status: AC
  Administered 2023-02-28: 150 mg via INTRAVENOUS
  Filled 2023-02-28: qty 150

## 2023-02-28 MED ORDER — PALONOSETRON HCL INJECTION 0.25 MG/5ML
0.2500 mg | Freq: Once | INTRAVENOUS | Status: AC
  Administered 2023-02-28: 0.25 mg via INTRAVENOUS
  Filled 2023-02-28: qty 5

## 2023-02-28 MED ORDER — ACETAMINOPHEN 325 MG PO TABS
650.0000 mg | ORAL_TABLET | Freq: Once | ORAL | Status: AC
  Administered 2023-02-28: 650 mg via ORAL
  Filled 2023-02-28: qty 2

## 2023-02-28 MED ORDER — DEXAMETHASONE SODIUM PHOSPHATE 10 MG/ML IJ SOLN
4.0000 mg | Freq: Once | INTRAMUSCULAR | Status: AC
  Administered 2023-02-28: 4 mg via INTRAVENOUS
  Filled 2023-02-28: qty 1

## 2023-02-28 MED ORDER — DIPHENHYDRAMINE HCL 25 MG PO CAPS
50.0000 mg | ORAL_CAPSULE | Freq: Once | ORAL | Status: AC
  Administered 2023-02-28: 50 mg via ORAL
  Filled 2023-02-28: qty 2

## 2023-02-28 MED ORDER — SODIUM CHLORIDE 0.9 % IV SOLN
49.8000 mg/m2 | Freq: Once | INTRAVENOUS | Status: AC
  Administered 2023-02-28: 100 mg via INTRAVENOUS
  Filled 2023-02-28: qty 10

## 2023-02-28 MED ORDER — SODIUM CHLORIDE 0.9 % IV SOLN
600.0000 mg | Freq: Once | INTRAVENOUS | Status: AC
  Administered 2023-02-28: 600 mg via INTRAVENOUS
  Filled 2023-02-28: qty 60

## 2023-02-28 MED ORDER — SODIUM CHLORIDE 0.9 % IV SOLN: Freq: Once | INTRAVENOUS | Status: AC

## 2023-02-28 NOTE — Progress Notes (Signed)
Mendel Ryder would like to adjust dose based on weight change.  Larene Beach, PharmD

## 2023-02-28 NOTE — Assessment & Plan Note (Addendum)
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  Treatment plan: Breast conserving surgery with lymph node biopsy Adjuvant chemotherapy with TCH followed by Herceptin maintenance Adjuvant radiation Adjuvant antiestrogen therapy  Chemo toxicities: Right port associated thrombus: Per Dr. Lindi Adie now that she has undergone 3 weeks of anticoagulation her port can be accessed for treatment. Peripheral neuropathy: We will dose reduce her Taxotere to 50 mg/m.  If this is persistent she knows we may have to discontinue the Taxotere/carboplatin portion of her treatment.  She is otherwise doing well.  We will see her back in 3 weeks for labs, follow-up, and her next treatment.

## 2023-02-28 NOTE — Progress Notes (Signed)
[facility]:    Tonya Vincent, [address] [place] [address]   DIAGNOSIS:  Cancer Staging  History of rectal cancer Staging form: Colon and Rectum, AJCC 7th Edition - Clinical: Stage I (T2, N0, M0) - Signed by [name], MD on 12/28/2013 Staged by: Managing physician Diagnostic confirmation: Positive histology Specimen type: Excision Histopathologic type: Adenocarcinoma, NOS - Pathologic: Stage I (T2, N0, cM0) - Signed by [name], MD on 12/28/2013 Diagnostic confirmation: Positive histology Specimen type: Excision Histopathologic type: Adenocarcinoma, NOS  Malignant neoplasm of upper-outer quadrant of left breast in female, estrogen receptor positive ([place]) Staging form: Breast, AJCC 8th Edition - Clinical: Stage IA (cT1a, cN0, cM0, G2, ER+, PR+, HER2+) - Signed by Nicholas Lose, MD on 10/31/2022 Stage prefix: Initial diagnosis Histologic grading system: 3 grade system   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 after neoadjuvant chemotherapy and radiation therapy   Primary site: Colon and Rectum   Staging method: AJCC 7th Edition   Clinical: Stage I (T2, N0, M0) signed by [name], MD on 12/28/2013  1:49 PM   Pathologic: Stage I (T2, N0, cM0) signed by [name], MD on 12/28/2013  1:49 PM   Summary: Stage I (T2, N0, cM0)     History of rectal cancer  11/17/2009 Procedure   Colonoscopy and rectal biopsy confirmed moderately differentiated adenocarcinoma   11/18/2009 Imaging   Transrectal ultrasound place staging T3 lesion   11/18/2009 Imaging   Staging CT scan of the chest, abdomen and pelvis showed multiple lesions in the liver as well as paraspinal mass of unknown etiology   12/05/2009 - 04/03/2010 Chemotherapy   The patient completed new adjuvant chemotherapy with 5-FU and radiation therapy   12/14/2009 Imaging   PET CT scan show faint  metabolic activity in the paraspinal mass with no activity in the liver   04/24/2010 Surgery   She had surgical resection with negative margins. Final pathology was T2, N0, M0 (down staged by chemoradiation therapy from T3, N0, M0)   11/19/2012 Imaging   MRI of the liver confirmed benign hemangioma   01/08/2014 Procedure   Colonoscopy and biopsy was negative   12/21/2014 Imaging   CT scan showed no recurrence of colon cancer. Incidentally, paraspinal mass is slightly larger.   10/04/2016 Procedure   She had repeat colonoscopy which showed two 3 mm polyps in the transverse colon, removed with a cold snare. Resected and retrieved. The examination was otherwise normal on direct and retroflexion views.   10/04/2016 Pathology Results   Surgical [P], transverse, polyp(s) - SERRATED POLYP WITH FOCAL FEATURES SUGGESTIVE OF SESSILE SERRATED POLYP/ADENOMA, ONE FRAGMENT. - HYPERPLASTIC POLYP WITHOUT DYSPLASIA, ONE FRAGMENT. - BENIGN COLORECTAL MUCOSA WITHOUT DYSPLASIA, ONE FRAGMENT. - SEE COMMENT. Microscopic Comment After routine specimen processing, three fragments are identified. One of the fragments demonstrates a serrated horizontally situated crypt, which is suggestive of superficial sampling of a sessile serrated polyp/adenoma (the differential includes another fragment of hyperplastic polyp). Although this is the case, the finding is very focal and not definitive.    11/09/2022 Genetic Testing   Negative genetics for [facility] Multi-Cancer +RNA Panel.  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 Report date is 11/09/2022.   The Multi-Cancer + RNA Panel offered by [facility] includes sequencing and/or deletion/duplication analysis of the following 70 genes:  AIP*, ALK, APC*, ATM*, AXIN2*, BAP1*, BARD1*, BLM*, BMPR1A*, BRCA1*,  BRCA2*, BRIP1*, CDC73*, CDH1*, CDK4, CDKN1B*, CDKN2A, CHEK2*, CTNNA1*, DICER1*, EPCAM (del/dup only), EGFR, FH*, FLCN*, GREM1 (promoter dup only), HOXB13,  KIT, LZTR1, MAX*, MBD4, MEN1*, MET, MITF, MLH1*, MSH2*, MSH3*, MSH6*, MUTYH*, NF1*, NF2*, NTHL1*, PALB2*, PDGFRA, PMS2*, POLD1*, POLE*, POT1*, PRKAR1A*, PTCH1*, PTEN*, RAD51C*, RAD51D*, RB1*, RET, SDHA* (sequencing only), SDHAF2*, SDHB*, SDHC*, SDHD*, SMAD4*, SMARCA4*, SMARCB1*, SMARCE1*, STK11*, SUFU*, TMEM127*, TP53*, TSC1*, TSC2*, VHL*. RNA analysis is performed for * genes.    11/09/2022 Genetic Testing   Negative genetics for [facility] Multi-Cancer +RNA Panel.  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 Report date is 11/09/2022.   The Multi-Cancer + RNA Panel offered by [facility] includes sequencing and/or deletion/duplication analysis of the following 70 genes:  AIP*, ALK, APC*, ATM*, AXIN2*, BAP1*, BARD1*, BLM*, BMPR1A*, BRCA1*, BRCA2*, BRIP1*, CDC73*, CDH1*, CDK4, CDKN1B*, CDKN2A, CHEK2*, CTNNA1*, DICER1*, EPCAM (del/dup only), EGFR, FH*, FLCN*, GREM1 (promoter dup only), HOXB13, KIT, LZTR1, MAX*, MBD4, MEN1*, MET, MITF, MLH1*, MSH2*, MSH3*, MSH6*, MUTYH*, NF1*, NF2*, NTHL1*, PALB2*, PDGFRA, PMS2*, POLD1*, POLE*, POT1*, PRKAR1A*, PTCH1*, PTEN*, RAD51C*, RAD51D*, RB1*, RET, SDHA* (sequencing only), SDHAF2*, SDHB*, SDHC*, SDHD*, SMAD4*, SMARCA4*, SMARCB1*, SMARCE1*, STK11*, SUFU*, TMEM127*, TP53*, TSC1*, TSC2*, VHL*. RNA analysis is performed for * genes.    Malignant neoplasm of upper-outer quadrant of left breast in female, estrogen receptor positive ([place])  10/17/2022 Initial Diagnosis   Diffuse bilateral breast pains.  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 Report date is 11/09/2022.   The Multi-Cancer + RNA Panel offered by [facility] includes sequencing and/or deletion/duplication analysis of the following 70 genes:  AIP*, ALK, APC*, ATM*, AXIN2*, BAP1*, BARD1*, BLM*, BMPR1A*, BRCA1*, BRCA2*, BRIP1*, CDC73*, CDH1*, CDK4, CDKN1B*, CDKN2A, CHEK2*, CTNNA1*, DICER1*, EPCAM (del/dup only), EGFR, FH*, FLCN*, GREM1 (promoter dup only), HOXB13, KIT, LZTR1, MAX*, MBD4, MEN1*, MET, MITF, MLH1*, MSH2*, MSH3*, MSH6*, MUTYH*, NF1*, NF2*, NTHL1*, PALB2*, PDGFRA, PMS2*, POLD1*, POLE*, POT1*, PRKAR1A*, PTCH1*, PTEN*, RAD51C*, RAD51D*, RB1*, RET, SDHA* (sequencing only), SDHAF2*, SDHB*, SDHC*, SDHD*, SMAD4*, SMARCA4*, SMARCB1*, SMARCE1*, STK11*, SUFU*, TMEM127*, TP53*, TSC1*, TSC2*, VHL*. RNA analysis is performed for * genes.    11/14/2022 Surgery   Left lumpectomy: Grade 2 ILC with LCIS 3.5 cm, 1/8 lymph node with micrometastases ER 100%, PR 40%, Ki-67 20%, HER2 3+ positive; lymph node prognostic panel: ER 100% PR 20% Ki-67 10%, HER2 2+ by IHC, FISH negative ratio 1.14   Malignant neoplasm of lower-outer quadrant of left breast of female, estrogen receptor positive ([place])  10/30/2022 Initial Diagnosis   Malignant neoplasm of lower-outer quadrant of left breast of female, estrogen receptor positive ([place])   12/27/2022 -  Chemotherapy   Patient is on Treatment Plan : BREAST Docetaxel + Carboplatin + Trastuzumab (TCH) q21d / Trastuzumab q21d       CURRENT THERAPY: Taxotere, Carboplatin, Herceptin  INTERVAL HISTORY: Tonya Vincent 72 y.o. female returns  for follow-up and evaluation prior to receiving her third cycle of adjuvant Taxotere carboplatin and Herceptin therapy given on day 1 of a 21-day  cycle.  He is doing moderately well other than some constant mild neuropathy in the very tips of her fingers.  She describes this as a numbness and not tingling.  Since her last visit she was seen in clinic for right arm streaking and swelling.  He was sent for a stat Doppler which demonstrated an acute DVT in the right internal jugular vein and right subclavian vein.  She was seen by Dr. Lindi Adie following the Doppler results and he started her on Xarelto and postponed her port revision in hopes that the anticoagulation would help her port function.  She is tolerating the Xarelto well at 20mg  daily and has no easy bruising/bleeding.   Patient Active Problem List   Diagnosis Date Noted   Deep venous thrombosis ([place]) 02/28/2023   Malignant neoplasm of rectum ([place]) 01/18/2023   Port-A-Cath in place 12/27/2022   Genetic testing 11/12/2022   Malignant neoplasm of lower-outer quadrant of left breast of female, estrogen receptor positive ([place]) 10/30/2022   Malignant neoplasm of upper-outer quadrant of left breast in female, estrogen receptor positive ([place]) 10/23/2022   S/P ORIF (open reduction internal fixation) fracture right ankle 04/11/20 04/21/2020   Closed trimalleolar fracture of right ankle    History of colonic polyps 12/18/2019   Fecal smearing 12/18/2019   Left arm pain 02/04/2019   Rectal bleeding 02/06/2018   Microcytosis 01/01/2017   Essential hypertension 01/02/2016   Change in stool caliber 12/31/2014   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  MEDICAL HISTORY: Past Medical History:  Diagnosis Date   ADHD (attention deficit hyperactivity disorder)    Anemia    Anxiety    Arthritis    Breast cancer ([place]) 10/2022   left breast ILC   Complication of  anesthesia    difficulty waking up   Depression    Diabetes mellitus    type 2 DM   Dyspnea    with exertion   Heart murmur    History of rectal cancer 10/04/2011   Hypertension    Overactive bladder    Paraspinal mass 12/29/2014   Rectal cancer ([place]) dx'd 11/16/09   xrt comp 01/2010; chemo comp 01/2011   Tubular adenoma of colon 01/08/2014    SURGICAL HISTORY: Past Surgical History:  Procedure Laterality Date   ABDOMINAL HYSTERECTOMY     for uterine fibroids   BREAST LUMPECTOMY WITH RADIOACTIVE SEED LOCALIZATION Left 11/14/2022   Procedure: LEFT BREAST RADIOACTIVE SEED GUIDED LUMPECTOMY x2;  Surgeon: Coralie Keens, MD;  Location: [facility];  Service: General;  Laterality: Left;   COLON SURGERY N/A 04/24/2010   "took out half rectum"; rectal cancer   EYE SURGERY     lasik surgery   HAND SURGERY     carpal tunnel- left   HEMORRHOID SURGERY N/A 1980   ORIF ANKLE FRACTURE Right 04/11/2020   Procedure: OPEN REDUCTION INTERNAL FIXATION (ORIF) ANKLE FRACTURE;  Surgeon: Carole Civil, MD;  Location: [facility];  Service: Orthopedics;  Laterality: Right;   PORT-A-CATH REMOVAL N/A 02/17/2019   Procedure: REMOVAL PORT-A-CATH;  Surgeon: Coralie Keens, MD;  Location: [facility];  Service: General;  Laterality: N/A;   PORTACATH PLACEMENT  2011   PORTACATH PLACEMENT N/A 12/17/2022   Procedure: INSERTION PORT-A-CATH;  Surgeon: Coralie Keens, MD;  Location: [facility];  Service: General;  Laterality: N/A;   RECTAL SURGERY     2010   SENTINEL NODE BIOPSY Left 11/14/2022   Procedure: SENTINEL LYMPH NODE BIOPSY;  Surgeon: Coralie Keens, MD;  Location: [facility];  Service: General;  Laterality: Left;   WISDOM TOOTH EXTRACTION      SOCIAL HISTORY: Social History   Socioeconomic History   Marital status: Single    Spouse name: Not on file   Number of children: Not on file   Years of education: Not on file   Highest education level: Not on file   Occupational History   Not on file  Tobacco Use   Smoking status: Former    Packs/day: 0.50    Years: 35.00    Additional pack years: 0.00    Total pack years: 17.50    Types: Cigarettes, Cigars    Quit date: 12/03/2009    Years since quitting: 13.2   Smokeless tobacco: Never  Vaping Use   Vaping Use: Never used  Substance and Sexual Activity   Alcohol use: Yes    Alcohol/week: 7.0 standard drinks of alcohol    Types: 7 Cans of beer per week    Comment: socially   Drug use: No   Sexual activity: Not Currently    Birth control/protection: Surgical    Comment: Hyst  Other Topics Concern   Not on file  Social History Narrative   Not on file   Social Determinants of Health   Financial Resource Strain: Not on file  Food Insecurity: Not on file  Transportation Needs: Not on file  Physical Activity: Not on file  Stress: Not on file  Social Connections: Not on file  Intimate Partner Violence: Not on file    FAMILY HISTORY: Family History  Problem Relation Age of Onset   Leukemia Father 76   Breast cancer Maternal Aunt        dx > 43; mother's maternal half sister   Kidney cancer Maternal Uncle        dx > 61   Breast cancer Paternal Aunt        dx > 55   Breast cancer Cousin        dx > 55; paternal female cousin   Mesothelioma Cousin        paternal female cousin   Colon cancer Neg Hx    Esophageal cancer Neg Hx    Stomach cancer Neg Hx    Rectal cancer Neg Hx     Review of Systems  Constitutional:  Negative for appetite change, chills, fatigue, fever and unexpected weight change.  HENT:   Negative for hearing loss, lump/mass and trouble swallowing.   Eyes:  Negative for eye problems and icterus.  Respiratory:  Negative for chest tightness, cough and shortness of breath.   Cardiovascular:  Negative for chest pain, leg swelling and palpitations.  Gastrointestinal:  Negative for abdominal distention, abdominal pain, constipation, diarrhea, nausea and vomiting.   Endocrine: Negative for hot flashes.  Genitourinary:  Negative for difficulty urinating.   Musculoskeletal:  Negative for arthralgias.  Skin:  Negative for itching and rash.  Neurological:  Positive for numbness. Negative for dizziness, extremity weakness and headaches.  Hematological:  Negative for adenopathy. Does not bruise/bleed easily.  Psychiatric/Behavioral:  Negative for depression. The patient is not nervous/anxious.       PHYSICAL  EXAMINATION  ECOG PERFORMANCE STATUS: 1 - Symptomatic but completely ambulatory  Vitals:   02/28/23 0915  BP: (!) 154/79  Pulse: 78  Resp: 18  Temp: (!) 97.5 F (36.4 C)  SpO2: 100%    Physical Exam Constitutional:      General: She is not in acute distress.    Appearance: Normal appearance. She is not toxic-appearing.  HENT:     Head: Normocephalic and atraumatic.  Eyes:     General: No scleral icterus. Cardiovascular:     Rate and Rhythm: Normal rate and regular rhythm.     Pulses: Normal pulses.     Heart sounds: Normal heart sounds.  Pulmonary:     Effort: Pulmonary effort is normal.     Breath sounds: Normal breath sounds.  Abdominal:     General: Abdomen is flat. Bowel sounds are normal. There is no distension.     Palpations: Abdomen is soft.     Tenderness: There is no abdominal tenderness.  Musculoskeletal:        General: No swelling.     Cervical back: Neck supple.  Lymphadenopathy:     Cervical: No cervical adenopathy.  Skin:    General: Skin is warm and dry.     Findings: No rash.  Neurological:     General: No focal deficit present.     Mental Status: She is alert.  Psychiatric:        Mood and Affect: Mood normal.        Behavior: Behavior normal.     LABORATORY DATA:  CBC    Component Value Date/Time   WBC 6.1 02/28/2023 0858   WBC 10.2 01/30/2023 2208   RBC 4.30 02/28/2023 0858   HGB 11.6 (L) 02/28/2023 0858   HGB 12.6 01/01/2017 0841   HCT 36.6 02/28/2023 0858   HCT 39.2 01/01/2017 0841    PLT 312 02/28/2023 0858   PLT 301 01/01/2017 0841   MCV 85.1 02/28/2023 0858   MCV 78.7 (L) 01/01/2017 0841   MCH 27.0 02/28/2023 0858   MCHC 31.7 02/28/2023 0858   RDW 18.3 (H) 02/28/2023 0858   RDW 17.9 (H) 01/01/2017 0841   LYMPHSABS 1.1 02/28/2023 0858   LYMPHSABS 1.3 01/01/2017 0841   MONOABS 0.7 02/28/2023 0858   MONOABS 0.5 01/01/2017 0841   EOSABS 0.0 02/28/2023 0858   EOSABS 0.1 01/01/2017 0841   BASOSABS 0.0 02/28/2023 0858   BASOSABS 0.0 01/01/2017 0841    CMP     Component Value Date/Time   NA 139 02/28/2023 0858   NA 141 01/01/2017 0841   K 3.9 02/28/2023 0858   K 3.7 01/01/2017 0841   CL 105 02/28/2023 0858   CL 99 11/05/2012 0842   CO2 25 02/28/2023 0858   CO2 25 01/01/2017 0841   GLUCOSE 143 (H) 02/28/2023 0858   GLUCOSE 106 01/01/2017 0841   GLUCOSE 167 (H) 11/05/2012 0842   BUN 14 02/28/2023 0858   BUN 18.2 01/01/2017 0841   CREATININE 1.07 (H) 02/28/2023 0858   CREATININE 1.3 (H) 01/01/2017 0841   CALCIUM 9.4 02/28/2023 0858   CALCIUM 9.5 01/01/2017 0841   PROT 7.6 02/28/2023 0858   PROT 7.5 01/01/2017 0841   ALBUMIN 4.3 02/28/2023 0858   ALBUMIN 4.0 01/01/2017 0841   AST 12 (L) 02/28/2023 0858   AST 13 01/01/2017 0841   ALT 12 02/28/2023 0858   ALT 16 01/01/2017 0841   ALKPHOS 95 02/28/2023 0858   ALKPHOS 89 01/01/2017 0841   BILITOT  0.4 02/28/2023 0858   BILITOT 0.29 01/01/2017 0841   GFRNONAA 56 (L) 02/28/2023 0858   GFRAA >60 05/27/2020 1443   GFRAA 52 (L) 01/12/2019 1330      ASSESSMENT and THERAPY PLAN:   Malignant neoplasm of upper-outer quadrant of left breast in female, estrogen receptor positive ([facility]) Tonya Vincent is a 72 year old woman with history of stage Ia triple positive left-sided breast cancer diagnosed in November 2023 status postlumpectomy here today for follow-up and evaluation prior to receiving cycle 3 of her adjuvant chemotherapy with Taxotere carboplatin and Herceptin therapy.  Treatment plan: Breast conserving  surgery with lymph node biopsy Adjuvant chemotherapy with TCH followed by Herceptin maintenance Adjuvant radiation Adjuvant antiestrogen therapy  Chemo toxicities: Right port associated thrombus: Per Dr. Lindi Adie now that she has undergone 3 weeks of anticoagulation her port can be accessed for treatment. Peripheral neuropathy: We will dose reduce her Taxotere to 50 mg/m.  If this is persistent she knows we may have to discontinue the Taxotere/carboplatin portion of her treatment.  She is otherwise doing well.  We will see her back in 3 weeks for labs, follow-up, and her next treatment.     Deep venous thrombosis ([place]) Patient with right internal jugular and subclavian vein DVT noted on February 27 upper extremity Doppler.  This is a port associated DVT.  She was started on Xarelto and is tolerating this well.  She will continue this.  The above was reviewed with Dr. Chryl Heck in detail and Dr. Geralyn Flash absence.  She is in agreement with the above assessment and plan.  All questions were answered. The patient knows to call the clinic with any problems, questions or concerns. We can certainly see the patient much sooner if necessary.  Total encounter time:30 minutes*in face-to-face visit time, chart review, lab review, care coordination, order entry, and documentation of the encounter time.    Wilber Bihari, NP 02/28/23 9:54 AM Medical Oncology and Hematology [facility] [place], [place] [address] Tel. [phone]    Fax. [phone]  *Total Encounter Time as defined by the Centers for Medicare and Medicaid Services includes, in addition to the face-to-face time of a patient visit (documented in the note above) non-face-to-face time: obtaining and reviewing outside history, ordering and reviewing medications, tests or procedures, care coordination (communications with other health care professionals or caregivers) and documentation in the medical record.

## 2023-02-28 NOTE — Research (Signed)
S2205, ICE COMPRESS: RANDOMIZED TRIAL OF LIMB CRYOCOMPRESSION VERSUS CONTINUOUS COMPRESSION VERSUS LOW CYCLIC COMPRESSION FOR THE PREVENTION  OF TAXANE-INDUCED PERIPHERAL NEUROPATHY    Patient arrives today Unaccompanied for the C3D1 visit; patient's C25D1 visit on 02/07/23 was held.   MD/PROVIDER VISIT: Patient sees Wilber Bihari, NP, for today's visit.  Confirmed no wounds/lesions to LUE & BLE. Will defer using wrap on RUE due to IV placement and recent blood clot to right forearm.  [place] applied and pre-treatment started 1153 Treatment and taxane started 1200 Pt has no complaints 1210 Pt has no complaints 1255 Post-treatment started; taxane complete 1325 Post-treatment complete; wraps removed. No wounds/lesions to extremities.    ADVERSE EVENTS: [name] reports AE as below.  ADVERSE EVENT LOG:  Tonya Vincent  02/28/2023  Adverse Event Log  Study/Protocol: S2205  Cycle: [id number] (by study/for research documentation, this is Cycle 4)  Event Grade Onset Date Resolved Date Drug Name Attribution Treatment Comments  Fall Grade 1 02/14/23 02/14/23 N/a Unrelated to device; definitely related to taxane N/a Pt tripped and caught herself but did hit the ground  Peripheral sensory neuropathy Grade 1 02/10/23  N/a Unrelated to device, definitely related to taxane N/a Fingers are slightly numb  Vascular access complications Grade 3 [id number]  N/a Unrelated Anticoagulation                                GIFT CARD: This study does not provide visit compensation.   DISPOSITION: Upon completion off all study requirements, patient remained in infusion to complete her treatement.  The patient was thanked for their time and continued voluntary participation in this study. [name] has been provided direct contact information and is encouraged to contact this Nurse for any needs or questions.  Marjie Skiff Paola Aleshire, RN, BSN, [facility]  Her   [facility] Clinical Research Nurse [facility] Direct Dial [phone]  Pager [phone] 02/28/2023 2:28 PM

## 2023-02-28 NOTE — Assessment & Plan Note (Signed)
Patient with right internal jugular and subclavian vein DVT noted on February 27 upper extremity Doppler.  This is a port associated DVT.  She was started on Xarelto and is tolerating this well.  She will continue this.

## 2023-02-28 NOTE — Patient Instructions (Signed)
[facility]  Discharge Instructions: Thank you for choosing [facility] to provide your oncology and hematology care.   If you have a lab appointment with the [facility], please go directly to the [facility] and check in at the registration area.   Wear comfortable clothing and clothing appropriate for easy access to any Portacath or PICC line.   We strive to give you quality time with your provider. You may need to reschedule your appointment if you arrive late (15 or more minutes).  Arriving late affects you and other patients whose appointments are after yours.  Also, if you miss three or more appointments without notifying the office, you may be dismissed from the clinic at the provider's discretion.      For prescription refill requests, have your pharmacy contact our office and allow 72 hours for refills to be completed.    Today you received the following chemotherapy and/or immunotherapy agents: Trastuzumab (Kanjinti), Docetaxel (Taxotere) and Carboplatin.   To help prevent nausea and vomiting after your treatment, we encourage you to take your nausea medication as directed.  BELOW ARE SYMPTOMS THAT SHOULD BE REPORTED IMMEDIATELY: *FEVER GREATER THAN 100.4 F (38 C) OR HIGHER *CHILLS OR SWEATING *NAUSEA AND VOMITING THAT IS NOT CONTROLLED WITH YOUR NAUSEA MEDICATION *UNUSUAL SHORTNESS OF BREATH *UNUSUAL BRUISING OR BLEEDING *URINARY PROBLEMS (pain or burning when urinating, or frequent urination) *BOWEL PROBLEMS (unusual diarrhea, constipation, pain near the anus) TENDERNESS IN MOUTH AND THROAT WITH OR WITHOUT PRESENCE OF ULCERS (sore throat, sores in mouth, or a toothache) UNUSUAL RASH, SWELLING OR PAIN  UNUSUAL VAGINAL DISCHARGE OR ITCHING   Items with * indicate a potential emergency and should be followed up as soon as possible or go to the Emergency Department if any problems should occur.  Please show the  CHEMOTHERAPY ALERT CARD or IMMUNOTHERAPY ALERT CARD at check-in to the Emergency Department and triage nurse.  Should you have questions after your visit or need to cancel or reschedule your appointment, please contact [facility]  Dept: [phone]  and follow the prompts.  Office hours are 8:00 a.m. to 4:30 p.m. Monday - Friday. Please note that voicemails left after 4:00 p.m. may not be returned until the following business day.  We are closed weekends and major holidays. You have access to a nurse at all times for urgent questions. Please call the main number to the clinic Dept: [phone] and follow the prompts.   For any non-urgent questions, you may also contact your provider using MyChart. We now offer e-Visits for anyone 26 and older to request care online for non-urgent symptoms. For details visit [email or web address].   Also download the MyChart app! Go to the app store, search "MyChart", open the app, select [facility], and log in with your MyChart username and password.

## 2023-02-28 NOTE — Progress Notes (Signed)
Right Port a cath tip not in correct place for treatment. Wilber Bihari, NP notified. Periphreal IV started and used for treatment today. Pt. sent for another chest x ray today.

## 2023-02-28 NOTE — Progress Notes (Signed)
BP elevated after treatment. Pt. denies chest pain, dizziness, no shortness of breath noted. Pt. states she did not take her blood pressure medications this morning, but will take when she gets home. Per Wilber Bihari, NP ok to discharge home. Pt. left via ambulation, no respiratory distress noted.

## 2023-03-02 ENCOUNTER — Inpatient Hospital Stay: Payer: Medicare HMO

## 2023-03-02 VITALS — BP 181/94 | HR 79 | Temp 97.8°F | Resp 16

## 2023-03-02 DIAGNOSIS — I82C11 Acute embolism and thrombosis of right internal jugular vein: Secondary | ICD-10-CM | POA: Diagnosis not present

## 2023-03-02 DIAGNOSIS — Z923 Personal history of irradiation: Secondary | ICD-10-CM | POA: Diagnosis not present

## 2023-03-02 DIAGNOSIS — Z79899 Other long term (current) drug therapy: Secondary | ICD-10-CM | POA: Diagnosis not present

## 2023-03-02 DIAGNOSIS — C50512 Malignant neoplasm of lower-outer quadrant of left female breast: Secondary | ICD-10-CM

## 2023-03-02 DIAGNOSIS — Z5111 Encounter for antineoplastic chemotherapy: Secondary | ICD-10-CM | POA: Diagnosis not present

## 2023-03-02 DIAGNOSIS — C50412 Malignant neoplasm of upper-outer quadrant of left female breast: Secondary | ICD-10-CM | POA: Diagnosis not present

## 2023-03-02 DIAGNOSIS — Z170 Estrogen receptor positive status [ER+]: Secondary | ICD-10-CM | POA: Diagnosis not present

## 2023-03-02 DIAGNOSIS — Z85048 Personal history of other malignant neoplasm of rectum, rectosigmoid junction, and anus: Secondary | ICD-10-CM | POA: Diagnosis not present

## 2023-03-02 DIAGNOSIS — Z7901 Long term (current) use of anticoagulants: Secondary | ICD-10-CM | POA: Diagnosis not present

## 2023-03-02 MED ORDER — PEGFILGRASTIM INJECTION 6 MG/0.6ML ~~LOC~~
6.0000 mg | PREFILLED_SYRINGE | Freq: Once | SUBCUTANEOUS | Status: AC
  Administered 2023-03-02: 6 mg via SUBCUTANEOUS

## 2023-03-03 DIAGNOSIS — E7800 Pure hypercholesterolemia, unspecified: Secondary | ICD-10-CM | POA: Diagnosis not present

## 2023-03-03 DIAGNOSIS — E1169 Type 2 diabetes mellitus with other specified complication: Secondary | ICD-10-CM | POA: Diagnosis not present

## 2023-03-03 DIAGNOSIS — I10 Essential (primary) hypertension: Secondary | ICD-10-CM | POA: Diagnosis not present

## 2023-03-04 ENCOUNTER — Encounter: Payer: Self-pay | Admitting: Hematology and Oncology

## 2023-03-04 ENCOUNTER — Telehealth: Payer: Self-pay | Admitting: Hematology and Oncology

## 2023-03-04 NOTE — Telephone Encounter (Signed)
Reached out to schedule per [facility] patient aware of date and times of appointment.

## 2023-03-05 ENCOUNTER — Other Ambulatory Visit: Payer: Self-pay

## 2023-03-05 NOTE — Progress Notes (Signed)
Corrected 5in5 documentation

## 2023-03-13 ENCOUNTER — Other Ambulatory Visit: Payer: Self-pay

## 2023-03-13 DIAGNOSIS — Z170 Estrogen receptor positive status [ER+]: Secondary | ICD-10-CM

## 2023-03-14 DIAGNOSIS — H40023 Open angle with borderline findings, high risk, bilateral: Secondary | ICD-10-CM | POA: Diagnosis not present

## 2023-03-14 DIAGNOSIS — Z961 Presence of intraocular lens: Secondary | ICD-10-CM | POA: Diagnosis not present

## 2023-03-19 LAB — GLUCOSE, POCT (MANUAL RESULT ENTRY): Glucose Fasting, POC: 107 mg/dL — AB (ref 70–99)

## 2023-03-19 NOTE — Progress Notes (Signed)
Patient took meds between reading #1 and reading #2 Patient advised to continue to follow up with MD and to take medication today and to take medications at the same time daily

## 2023-03-20 MED FILL — Fosaprepitant Dimeglumine For IV Infusion 150 MG (Base Eq): INTRAVENOUS | Qty: 5 | Status: AC

## 2023-03-20 NOTE — Progress Notes (Signed)
Patient Care Team: [name], MD as PCP - General (Family Medicine) [name], MD as Consulting Physician (Hematology and Oncology) Rockwell Germany, RN as Oncology Nurse Navigator Mauro Kaufmann, RN as Oncology Nurse Navigator Nicholas Lose, MD as Consulting Physician (Hematology and Oncology) Coralie Keens, MD as Consulting Physician (General Surgery) Gery Pray, MD as Consulting Physician (Radiation Oncology)  DIAGNOSIS: No diagnosis found.  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 after neoadjuvant chemotherapy and radiation therapy   Primary site: Colon and Rectum   Staging method: AJCC 7th Edition   Clinical: Stage I (T2, N0, M0) signed by [name], MD on 12/28/2013  1:49 PM   Pathologic: Stage I (T2, N0, cM0) signed by [name], MD on 12/28/2013  1:49 PM   Summary: Stage I (T2, N0, cM0)     History of rectal cancer  11/17/2009 Procedure   Colonoscopy and rectal biopsy confirmed moderately differentiated adenocarcinoma   11/18/2009 Imaging   Transrectal ultrasound place staging T3 lesion   11/18/2009 Imaging   Staging CT scan of the chest, abdomen and pelvis showed multiple lesions in the liver as well as paraspinal mass of unknown etiology   12/05/2009 - 04/03/2010 Chemotherapy   The patient completed new adjuvant chemotherapy with 5-FU and radiation therapy   12/14/2009 Imaging   PET CT scan show faint metabolic activity in the paraspinal mass with no activity in the liver   04/24/2010 Surgery   She had surgical resection with negative margins. Final pathology was T2, N0, M0 (down staged by chemoradiation therapy from T3, N0, M0)   11/19/2012 Imaging   MRI of the liver confirmed benign hemangioma   01/08/2014 Procedure   Colonoscopy and biopsy was negative   12/21/2014 Imaging   CT scan showed no recurrence of colon cancer. Incidentally, paraspinal mass is slightly  larger.   10/04/2016 Procedure   She had repeat colonoscopy which showed two 3 mm polyps in the transverse colon, removed with a cold snare. Resected and retrieved. The examination was otherwise normal on direct and retroflexion views.   10/04/2016 Pathology Results   Surgical [P], transverse, polyp(s) - SERRATED POLYP WITH FOCAL FEATURES SUGGESTIVE OF SESSILE SERRATED POLYP/ADENOMA, ONE FRAGMENT. - HYPERPLASTIC POLYP WITHOUT DYSPLASIA, ONE FRAGMENT. - BENIGN COLORECTAL MUCOSA WITHOUT DYSPLASIA, ONE FRAGMENT. - SEE COMMENT. Microscopic Comment After routine specimen processing, three fragments are identified. One of the fragments demonstrates a serrated horizontally situated crypt, which is suggestive of superficial sampling of a sessile serrated polyp/adenoma (the differential includes another fragment of hyperplastic polyp). Although this is the case, the finding is very focal and not definitive.    11/09/2022 Genetic Testing   Negative genetics for [facility] Multi-Cancer +RNA Panel.  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 Report date is 11/09/2022.   The Multi-Cancer + RNA Panel offered by [facility] includes sequencing and/or deletion/duplication analysis of the following 70 genes:  AIP*, ALK, APC*, ATM*, AXIN2*, BAP1*, BARD1*, BLM*, BMPR1A*, BRCA1*, BRCA2*, BRIP1*, CDC73*, CDH1*, CDK4, CDKN1B*, CDKN2A, CHEK2*, CTNNA1*, DICER1*, EPCAM (del/dup only), EGFR, FH*, FLCN*, GREM1 (promoter dup only), HOXB13, KIT, LZTR1, MAX*, MBD4, MEN1*, MET, MITF, MLH1*, MSH2*, MSH3*, MSH6*, MUTYH*, NF1*, NF2*, NTHL1*, PALB2*, PDGFRA, PMS2*, POLD1*, POLE*, POT1*, PRKAR1A*, PTCH1*, PTEN*, RAD51C*, RAD51D*, RB1*, RET, SDHA* (sequencing only), SDHAF2*, SDHB*, SDHC*, SDHD*, SMAD4*, SMARCA4*, SMARCB1*, SMARCE1*, STK11*, SUFU*, TMEM127*, TP53*, TSC1*, TSC2*, VHL*. RNA analysis is performed for * genes.    11/09/2022 Genetic Testing  Negative genetics for [facility] Multi-Cancer +RNA Panel.  Variant of  uncertain significance in KIT at c.839C>T (p.Ala280Val). Report date is 11/09/2022.   The Multi-Cancer + RNA Panel offered by [facility] includes sequencing and/or deletion/duplication analysis of the following 70 genes:  AIP*, ALK, APC*, ATM*, AXIN2*, BAP1*, BARD1*, BLM*, BMPR1A*, BRCA1*, BRCA2*, BRIP1*, CDC73*, CDH1*, CDK4, CDKN1B*, CDKN2A, CHEK2*, CTNNA1*, DICER1*, EPCAM (del/dup only), EGFR, FH*, FLCN*, GREM1 (promoter dup only), HOXB13, KIT, LZTR1, MAX*, MBD4, MEN1*, MET, MITF, MLH1*, MSH2*, MSH3*, MSH6*, MUTYH*, NF1*, NF2*, NTHL1*, PALB2*, PDGFRA, PMS2*, POLD1*, POLE*, POT1*, PRKAR1A*, PTCH1*, PTEN*, RAD51C*, RAD51D*, RB1*, RET, SDHA* (sequencing only), SDHAF2*, SDHB*, SDHC*, SDHD*, SMAD4*, SMARCA4*, SMARCB1*, SMARCE1*, STK11*, SUFU*, TMEM127*, TP53*, TSC1*, TSC2*, VHL*. RNA analysis is performed for * genes.    Malignant neoplasm of upper-outer quadrant of left breast in female, estrogen receptor positive  10/17/2022 Initial Diagnosis   Diffuse bilateral breast pains.  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 Report date is 11/09/2022.   The Multi-Cancer + RNA Panel offered by [facility] includes sequencing and/or deletion/duplication analysis of the following 70 genes:  AIP*, ALK, APC*, ATM*, AXIN2*, BAP1*, BARD1*, BLM*,  BMPR1A*, BRCA1*, BRCA2*, BRIP1*, CDC73*, CDH1*, CDK4, CDKN1B*, CDKN2A, CHEK2*, CTNNA1*, DICER1*, EPCAM (del/dup only), EGFR, FH*, FLCN*, GREM1 (promoter dup only), HOXB13, KIT, LZTR1, MAX*, MBD4, MEN1*, MET, MITF, MLH1*, MSH2*, MSH3*, MSH6*, MUTYH*, NF1*, NF2*, NTHL1*, PALB2*, PDGFRA, PMS2*, POLD1*, POLE*, POT1*, PRKAR1A*, PTCH1*, PTEN*, RAD51C*, RAD51D*, RB1*, RET, SDHA* (sequencing only), SDHAF2*, SDHB*, SDHC*, SDHD*, SMAD4*, SMARCA4*, SMARCB1*, SMARCE1*, STK11*, SUFU*, TMEM127*, TP53*, TSC1*, TSC2*, VHL*. RNA analysis is performed for * genes.    11/14/2022 Surgery   Left lumpectomy: Grade 2 ILC with LCIS 3.5 cm, 1/8 lymph node with micrometastases ER 100%, PR 40%, Ki-67 20%, HER2 3+ positive; lymph node prognostic panel: ER 100% PR 20% Ki-67 10%, HER2 2+ by IHC, FISH negative ratio 1.14   Malignant neoplasm of lower-outer quadrant of left breast of female, estrogen receptor positive  10/30/2022 Initial Diagnosis   Malignant neoplasm of lower-outer quadrant of left breast of female, estrogen receptor positive (HCC)   12/27/2022 -  Chemotherapy   Patient is on Treatment Plan : BREAST Docetaxel + Carboplatin + Trastuzumab (TCH) q21d / Trastuzumab q21d       CHIEF COMPLIANT:   INTERVAL HISTORY: Tonya Vincent is a   ALLERGIES:  is allergic to advanced hand sanitizer [alcohol], norgesic [orphenadrine-aspirin-caffeine], hydrocodone, oxycodone, and antibacterial hand soap [triclosan].  MEDICATIONS:  Current Outpatient Medications  Medication Sig Dispense Refill   acetaminophen-codeine (TYLENOL #3) 300-30 MG tablet Take 1 tablet by mouth every 6 (six) hours as needed for moderate pain. 25 tablet 0   amLODipine (NORVASC) 10 MG tablet Take 10 mg by mouth at bedtime.      Aromatic Inhalants (VICKS VAPOINHALER IN) Inhale 1 puff into the lungs daily as  needed (congestion).     aspirin EC 81 MG tablet Take 81 mg by mouth at bedtime.      atorvastatin (LIPITOR) 10 MG tablet Take 10 mg by mouth  daily.     dexamethasone (DECADRON) 4 MG tablet Take 1 tablet (4 mg total) by mouth daily. Take 1 tablet day before chemo and 1 tablet day after chemo with food 8 tablet 0   docusate sodium (COLACE) 100 MG capsule Take 100-400 mg by mouth daily as needed for mild constipation.     empagliflozin (JARDIANCE) 25 MG TABS tablet Take 25 mg by mouth daily with supper.      ibuprofen (ADVIL) 800 MG tablet Take 800 mg by mouth every 8 (eight) hours as needed for moderate pain.     latanoprost (XALATAN) 0.005 % ophthalmic solution Place 1 drop into both eyes at bedtime.      lidocaine-prilocaine (EMLA) cream Apply to affected area once 30 g 3   loperamide (IMODIUM A-D) 2 MG tablet Take 2 mg by mouth 4 (four) times daily as needed for diarrhea or loose stools.     loratadine (CLARITIN REDITABS) 10 MG dissolvable tablet Take 10 mg by mouth daily.     olmesartan (BENICAR) 40 MG tablet Take 40 mg by mouth daily.     ondansetron (ZOFRAN) 8 MG tablet Take 1 tablet (8 mg total) by mouth every 8 (eight) hours as needed for nausea or vomiting. Start on the third day after chemotherapy. 30 tablet 1   phenylephrine (NEO-SYNEPHRINE) 0.25 % nasal spray Place 1 spray into both nostrils every 6 (six) hours as needed for congestion. Prn for nose bleed     prochlorperazine (COMPAZINE) 10 MG tablet Take 1 tablet (10 mg total) by mouth every 6 (six) hours as needed for nausea or vomiting. 30 tablet 1   rivaroxaban (XARELTO) 20 MG TABS tablet Take 1 tablet (20 mg total) by mouth daily with supper. 30 tablet 3   Semaglutide, 1 MG/DOSE, 2 MG/1.5ML SOPN Inject 1 mg into the skin every Sunday.     spironolactone (ALDACTONE) 25 MG tablet Take 50 mg by mouth daily.     vortioxetine HBr (TRINTELLIX) 10 MG TABS tablet Take 10 mg by mouth daily with supper.     No current facility-administered medications for this visit.    PHYSICAL EXAMINATION: ECOG PERFORMANCE STATUS: {CHL ONC ECOG PS:[phone]}  There were no vitals filed for  this visit. There were no vitals filed for this visit.  BREAST:*** No palpable masses or nodules in either right or left breasts. No palpable axillary supraclavicular or infraclavicular adenopathy no breast tenderness or nipple discharge. (exam performed in the presence of a chaperone)  LABORATORY DATA:  I have reviewed the data as listed    Latest Ref Rng & Units 02/28/2023    8:58 AM 01/30/2023   10:08 PM 01/18/2023    1:40 PM  CMP  Glucose 70 - 99 mg/dL 287  867  672   BUN 8 - 23 mg/dL 14  13  20    Creatinine 0.44 - 1.00 mg/dL 0.94  7.09  6.28   Sodium 135 - 145 mmol/L 139  136  138   Potassium 3.5 - 5.1 mmol/L 3.9  4.3  4.1   Chloride 98 - 111 mmol/L 105  106  105   CO2 22 - 32 mmol/L 25  22  25    Calcium 8.9 - 10.3 mg/dL 9.4  8.4  8.0   Total Protein 6.5 -  8.1 g/dL 7.6   6.6   Total Bilirubin 0.3 - 1.2 mg/dL 0.4   0.3   Alkaline Phos 38 - 126 U/L 95   89   AST 15 - 41 U/L 12   25   ALT 0 - 44 U/L 12   54     Lab Results  Component Value Date   WBC 6.1 02/28/2023   HGB 11.6 (L) 02/28/2023   HCT 36.6 02/28/2023   MCV 85.1 02/28/2023   PLT 312 02/28/2023   NEUTROABS 4.2 02/28/2023    ASSESSMENT & PLAN:  No problem-specific Assessment & Plan notes found for this encounter.    No orders of the defined types were placed in this encounter.  The patient has a good understanding of the overall plan. she agrees with it. she will call with any problems that may develop before the next visit here. Total time spent: 30 mins including face to face time and time spent for planning, charting and co-ordination of care   Sherlyn Lick, CMA 03/20/23    I Janan Ridge am acting as a Neurosurgeon for [employer]  ***

## 2023-03-21 ENCOUNTER — Inpatient Hospital Stay: Payer: Medicare HMO | Attending: Hematology and Oncology

## 2023-03-21 ENCOUNTER — Other Ambulatory Visit: Payer: Self-pay | Admitting: *Deleted

## 2023-03-21 ENCOUNTER — Inpatient Hospital Stay: Payer: Medicare HMO

## 2023-03-21 ENCOUNTER — Encounter: Payer: Self-pay | Admitting: *Deleted

## 2023-03-21 ENCOUNTER — Inpatient Hospital Stay (HOSPITAL_BASED_OUTPATIENT_CLINIC_OR_DEPARTMENT_OTHER): Payer: Medicare HMO | Admitting: Hematology and Oncology

## 2023-03-21 VITALS — BP 187/92 | HR 80 | Temp 97.8°F | Resp 18 | Ht 64.0 in | Wt 192.7 lb

## 2023-03-21 VITALS — BP 182/90 | HR 68 | Resp 18

## 2023-03-21 DIAGNOSIS — C50412 Malignant neoplasm of upper-outer quadrant of left female breast: Secondary | ICD-10-CM

## 2023-03-21 DIAGNOSIS — Z170 Estrogen receptor positive status [ER+]: Secondary | ICD-10-CM | POA: Insufficient documentation

## 2023-03-21 DIAGNOSIS — Z95828 Presence of other vascular implants and grafts: Secondary | ICD-10-CM

## 2023-03-21 DIAGNOSIS — Z923 Personal history of irradiation: Secondary | ICD-10-CM | POA: Diagnosis not present

## 2023-03-21 DIAGNOSIS — Z7901 Long term (current) use of anticoagulants: Secondary | ICD-10-CM | POA: Diagnosis not present

## 2023-03-21 DIAGNOSIS — Z85048 Personal history of other malignant neoplasm of rectum, rectosigmoid junction, and anus: Secondary | ICD-10-CM | POA: Diagnosis not present

## 2023-03-21 DIAGNOSIS — Z86718 Personal history of other venous thrombosis and embolism: Secondary | ICD-10-CM | POA: Diagnosis not present

## 2023-03-21 DIAGNOSIS — Z79899 Other long term (current) drug therapy: Secondary | ICD-10-CM | POA: Insufficient documentation

## 2023-03-21 DIAGNOSIS — C50512 Malignant neoplasm of lower-outer quadrant of left female breast: Secondary | ICD-10-CM

## 2023-03-21 DIAGNOSIS — Z5111 Encounter for antineoplastic chemotherapy: Secondary | ICD-10-CM | POA: Diagnosis present

## 2023-03-21 DIAGNOSIS — Z5181 Encounter for therapeutic drug level monitoring: Secondary | ICD-10-CM

## 2023-03-21 LAB — CBC WITH DIFFERENTIAL (CANCER CENTER ONLY)
Abs Immature Granulocytes: 0.02 10*3/uL (ref 0.00–0.07)
Basophils Absolute: 0 10*3/uL (ref 0.0–0.1)
Basophils Relative: 1 %
Eosinophils Absolute: 0 10*3/uL (ref 0.0–0.5)
Eosinophils Relative: 0 %
HCT: 35 % — ABNORMAL LOW (ref 36.0–46.0)
Hemoglobin: 11.4 g/dL — ABNORMAL LOW (ref 12.0–15.0)
Immature Granulocytes: 0 %
Lymphocytes Relative: 22 %
Lymphs Abs: 1.1 10*3/uL (ref 0.7–4.0)
MCH: 28.1 pg (ref 26.0–34.0)
MCHC: 32.6 g/dL (ref 30.0–36.0)
MCV: 86.2 fL (ref 80.0–100.0)
Monocytes Absolute: 1.1 10*3/uL — ABNORMAL HIGH (ref 0.1–1.0)
Monocytes Relative: 20 %
Neutro Abs: 3 10*3/uL (ref 1.7–7.7)
Neutrophils Relative %: 57 %
Platelet Count: 251 10*3/uL (ref 150–400)
RBC: 4.06 MIL/uL (ref 3.87–5.11)
RDW: 19.1 % — ABNORMAL HIGH (ref 11.5–15.5)
WBC Count: 5.3 10*3/uL (ref 4.0–10.5)
nRBC: 0 % (ref 0.0–0.2)

## 2023-03-21 LAB — CMP (CANCER CENTER ONLY)
ALT: 15 U/L (ref 0–44)
AST: 16 U/L (ref 15–41)
Albumin: 4.4 g/dL (ref 3.5–5.0)
Alkaline Phosphatase: 89 U/L (ref 38–126)
Anion gap: 8 (ref 5–15)
BUN: 13 mg/dL (ref 8–23)
CO2: 25 mmol/L (ref 22–32)
Calcium: 9.6 mg/dL (ref 8.9–10.3)
Chloride: 106 mmol/L (ref 98–111)
Creatinine: 0.98 mg/dL (ref 0.44–1.00)
GFR, Estimated: >60 mL/min (ref >60)
Glucose, Bld: 89 mg/dL (ref 70–99)
Potassium: 4 mmol/L (ref 3.5–5.1)
Sodium: 139 mmol/L (ref 135–145)
Total Bilirubin: 0.3 mg/dL (ref 0.3–1.2)
Total Protein: 7.6 g/dL (ref 6.5–8.1)

## 2023-03-21 LAB — RESEARCH LABS

## 2023-03-21 MED ORDER — SODIUM CHLORIDE 0.9 % IV SOLN: Freq: Once | INTRAVENOUS | Status: AC

## 2023-03-21 MED ORDER — ACETAMINOPHEN 325 MG PO TABS
650.0000 mg | ORAL_TABLET | Freq: Once | ORAL | Status: AC
  Administered 2023-03-21: 650 mg via ORAL
  Filled 2023-03-21: qty 2

## 2023-03-21 MED ORDER — DEXAMETHASONE SODIUM PHOSPHATE 10 MG/ML IJ SOLN
4.0000 mg | Freq: Once | INTRAMUSCULAR | Status: AC
  Administered 2023-03-21: 4 mg via INTRAVENOUS
  Filled 2023-03-21: qty 1

## 2023-03-21 MED ORDER — DIPHENHYDRAMINE HCL 25 MG PO CAPS
50.0000 mg | ORAL_CAPSULE | Freq: Once | ORAL | Status: AC
  Administered 2023-03-21: 50 mg via ORAL
  Filled 2023-03-21: qty 2

## 2023-03-21 MED ORDER — SODIUM CHLORIDE 0.9 % IV SOLN
600.0000 mg | Freq: Once | INTRAVENOUS | Status: AC
  Administered 2023-03-21: 600 mg via INTRAVENOUS
  Filled 2023-03-21: qty 60

## 2023-03-21 MED ORDER — SODIUM CHLORIDE 0.9% FLUSH
10.0000 mL | INTRAVENOUS | Status: DC | PRN
  Administered 2023-03-21: 10 mL via INTRAVENOUS

## 2023-03-21 MED ORDER — TRASTUZUMAB-ANNS CHEMO 150 MG IV SOLR
6.0000 mg/kg | Freq: Once | INTRAVENOUS | Status: AC
  Administered 2023-03-21: 525 mg via INTRAVENOUS
  Filled 2023-03-21: qty 25

## 2023-03-21 MED ORDER — PALONOSETRON HCL INJECTION 0.25 MG/5ML
0.2500 mg | Freq: Once | INTRAVENOUS | Status: AC
  Administered 2023-03-21: 0.25 mg via INTRAVENOUS
  Filled 2023-03-21: qty 5

## 2023-03-21 MED ORDER — SODIUM CHLORIDE 0.9 % IV SOLN
150.0000 mg | Freq: Once | INTRAVENOUS | Status: AC
  Administered 2023-03-21: 150 mg via INTRAVENOUS
  Filled 2023-03-21: qty 150

## 2023-03-21 NOTE — Patient Instructions (Signed)
[facility]  Discharge Instructions: Thank you for choosing [facility] to provide your oncology and hematology care.   If you have a lab appointment with the Cancer Center, please go directly to the Cancer Center and check in at the registration area.   Wear comfortable clothing and clothing appropriate for easy access to any Portacath or PICC line.   We strive to give you quality time with your provider. You may need to reschedule your appointment if you arrive late (15 or more minutes).  Arriving late affects you and other patients whose appointments are after yours.  Also, if you miss three or more appointments without notifying the office, you may be dismissed from the clinic at the provider's discretion.      For prescription refill requests, have your pharmacy contact our office and allow 72 hours for refills to be completed.    Today you received the following chemotherapy and/or immunotherapy agents: Trastuzumab, Carboplatin      To help prevent nausea and vomiting after your treatment, we encourage you to take your nausea medication as directed.  BELOW ARE SYMPTOMS THAT SHOULD BE REPORTED IMMEDIATELY: *FEVER GREATER THAN 100.4 F (38 C) OR HIGHER *CHILLS OR SWEATING *NAUSEA AND VOMITING THAT IS NOT CONTROLLED WITH YOUR NAUSEA MEDICATION *UNUSUAL SHORTNESS OF BREATH *UNUSUAL BRUISING OR BLEEDING *URINARY PROBLEMS (pain or burning when urinating, or frequent urination) *BOWEL PROBLEMS (unusual diarrhea, constipation, pain near the anus) TENDERNESS IN MOUTH AND THROAT WITH OR WITHOUT PRESENCE OF ULCERS (sore throat, sores in mouth, or a toothache) UNUSUAL RASH, SWELLING OR PAIN  UNUSUAL VAGINAL DISCHARGE OR ITCHING   Items with * indicate a potential emergency and should be followed up as soon as possible or go to the Emergency Department if any problems should occur.  Please show the CHEMOTHERAPY ALERT CARD or IMMUNOTHERAPY  ALERT CARD at check-in to the Emergency Department and triage nurse.  Should you have questions after your visit or need to cancel or reschedule your appointment, please contact [facility]  Dept: [phone]  and follow the prompts.  Office hours are 8:00 a.m. to 4:30 p.m. Monday - Friday. Please note that voicemails left after 4:00 p.m. may not be returned until the following business day.  We are closed weekends and major holidays. You have access to a nurse at all times for urgent questions. Please call the main number to the clinic Dept: [phone] and follow the prompts.   For any non-urgent questions, you may also contact your provider using MyChart. We now offer e-Visits for anyone 2 and older to request care online for non-urgent symptoms. For details visit [email or web address].   Also download the MyChart app! Go to the app store, search "MyChart", open the app, select , and log in with your MyChart username and password.

## 2023-03-21 NOTE — Assessment & Plan Note (Signed)
10/17/2022 Diffuse bilateral breast pains.  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) no distant metastatic disease    Treatment plan: Adjuvant chemotherapy with TCH followed by Herceptin maintenance Adjuvant radiation Adjuvant antiestrogen therapy --------------------------------------------------------------------------------------------------------------------- Current Treatment: Cycle 4 TCH Chemo toxicities: Bone pain from Neulasta Diarrhea: now controlled with imodium Abd discomfort and dec appetite Mild Nausea: takes Compazine   Right neck swelling and pain with redness of the right forearm: 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

## 2023-03-21 NOTE — Research (Deleted)
TRIAL S2205, ICE COMPRESS: RANDOMIZED TRIAL OF LIMB CRYOCOMPRESSION VERSUS CONTINUOUS COMPRESSION VERSUS LOW CYCLIC COMPRESSION FOR THE PREVENTION  OF TAXANE-INDUCED PERIPHERAL NEUROPATHY   Patient arrives today Unaccompanied for the Cycle 5 visit. Per study, this is week 12.    PROs: Per study protocol, all PROs required for this visit were completed prior to other study activities and completeness has been verified.     LABS: Mandatory and optional labs are collected per consent and study protocol: [name] tolerated well without complaint.   MD/PROVIDER VISIT: Patient sees Dr Pamelia Hoit for today's visit.   ADVERSE EVENTS: [name] reports AE as below. Attribution per Dr Pamelia Hoit.  ADVERSE EVENT LOG:  Tonya Vincent [id number]  03/21/2023  Adverse Event Log    Event Grade Onset Date Resolved Date Drug Name Attribution Treatment Comments  Fall Grade 1 02/14/23 02/14/23 N/a Unrelated to device; definitely related to taxane N/a Pt tripped and caught herself but did hit the ground  Peripheral sensory neuropathy Grade 1 02/10/23  03/20/23 N/a Unrelated to device, definitely related to taxane N/a Fingers are slightly numb  Vascular access complications Grade 3 01/29/23   N/a Unrelated Anticoagulation    Peripheral sensory neuropathy Grade 2 03/21/23     Unrelated to device, definitely related to taxane Stopping taxane; will not get treatment going forward Patient reports feeling tingling and numb all over her body                                      NEURO ASSESSMENT: The neuro assessment was completed by this nurse along with Juanita Laster, RN. [name] tolerated all testing without complaint.   GIFT CARD: This study does not provide visit compensation.   DISPOSITION: Upon completion off all study requirements, patient was escorted to infusion, where she will receive treatement.   The patient was thanked for their time and continued voluntary  participation in this study. [name] has been provided direct contact information and is encouraged to contact this Nurse for any needs or questions.  Margret Chance Anylah Scheib, RN, BSN, [facility]  Her  [facility] Clinical Research Nurse [facility] Direct Dial [phone]  Pager [phone] 03/21/2023 1:13 PM

## 2023-03-21 NOTE — Progress Notes (Signed)
Received message from infusion RN stating they were unable to obtain blood flow from port a cath.  Also states port can not be accessed based on recent chest xray results.  RN sent message to pt surgeon, Dr. Magnus Ivan for further evaluation of port a cath.

## 2023-03-21 NOTE — Progress Notes (Signed)
Per MD okay to treat with echo from 12/10/22.  Verbal orders received to repeat prior to next appt.  Orders placed.

## 2023-03-21 NOTE — Progress Notes (Signed)
Per Dr. 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 MD notified.

## 2023-03-21 NOTE — Progress Notes (Signed)
Per MD request, RN placed order for Dye study to evaluate placement and flow of port a cath.

## 2023-03-21 NOTE — Progress Notes (Signed)
Per Dr. Pamelia Hoit, OK to discharge pt with BP 182/90, with headache 3/10 starting 30 min prior to discharge. Ambulatory to lobby.

## 2023-03-22 ENCOUNTER — Other Ambulatory Visit (HOSPITAL_COMMUNITY): Payer: Self-pay | Admitting: Interventional Radiology

## 2023-03-22 ENCOUNTER — Ambulatory Visit (HOSPITAL_COMMUNITY)
Admission: RE | Admit: 2023-03-22 | Discharge: 2023-03-22 | Disposition: A | Discharge status: Home / Self Care | Payer: Medicare HMO | Source: Ambulatory Visit | Attending: Hematology and Oncology | Admitting: Hematology and Oncology

## 2023-03-22 DIAGNOSIS — E222 Syndrome of inappropriate secretion of antidiuretic hormone: Secondary | ICD-10-CM

## 2023-03-22 DIAGNOSIS — Y849 Medical procedure, unspecified as the cause of abnormal reaction of the patient, or of later complication, without mention of misadventure at the time of the procedure: Secondary | ICD-10-CM | POA: Diagnosis not present

## 2023-03-22 DIAGNOSIS — T82514A Breakdown (mechanical) of infusion catheter, initial encounter: Secondary | ICD-10-CM | POA: Diagnosis not present

## 2023-03-22 DIAGNOSIS — Z95828 Presence of other vascular implants and grafts: Secondary | ICD-10-CM

## 2023-03-22 DIAGNOSIS — T82598A Other mechanical complication of other cardiac and vascular devices and implants, initial encounter: Secondary | ICD-10-CM | POA: Diagnosis not present

## 2023-03-22 HISTORY — PX: IR CV LINE INJECTION: IMG2294

## 2023-03-22 MED ORDER — IOHEXOL 300 MG/ML  SOLN
50.0000 mL | Freq: Once | INTRAMUSCULAR | Status: AC | PRN
  Administered 2023-03-22: 3 mL via INTRAVENOUS

## 2023-03-22 NOTE — Procedures (Signed)
Pre Procedure Dx: Malfunctioning port Post Procedural Dx: Same  Port injection demonstrates retraction of port tip, now within the caudal aspect of the right IJ with associated occlusion of the IJ.  Complications: None immediate.  PLAN: Pt has been scheduled for definitive port revision on 5/7, prior to her next treatment session.   Katherina Right, MD Pager #: [phone]

## 2023-03-23 ENCOUNTER — Inpatient Hospital Stay: Payer: Medicare HMO

## 2023-03-25 ENCOUNTER — Encounter: Payer: Self-pay | Admitting: Hematology and Oncology

## 2023-03-25 DIAGNOSIS — I10 Essential (primary) hypertension: Secondary | ICD-10-CM | POA: Diagnosis not present

## 2023-03-25 DIAGNOSIS — F902 Attention-deficit hyperactivity disorder, combined type: Secondary | ICD-10-CM | POA: Diagnosis not present

## 2023-03-25 DIAGNOSIS — C50512 Malignant neoplasm of lower-outer quadrant of left female breast: Secondary | ICD-10-CM | POA: Diagnosis not present

## 2023-03-25 DIAGNOSIS — E1169 Type 2 diabetes mellitus with other specified complication: Secondary | ICD-10-CM | POA: Diagnosis not present

## 2023-03-25 DIAGNOSIS — Z170 Estrogen receptor positive status [ER+]: Secondary | ICD-10-CM | POA: Diagnosis not present

## 2023-03-25 DIAGNOSIS — Z6833 Body mass index (BMI) 33.0-33.9, adult: Secondary | ICD-10-CM | POA: Diagnosis not present

## 2023-03-25 DIAGNOSIS — C20 Malignant neoplasm of rectum: Secondary | ICD-10-CM | POA: Diagnosis not present

## 2023-03-25 DIAGNOSIS — N189 Chronic kidney disease, unspecified: Secondary | ICD-10-CM | POA: Diagnosis not present

## 2023-03-25 DIAGNOSIS — I82621 Acute embolism and thrombosis of deep veins of right upper extremity: Secondary | ICD-10-CM | POA: Diagnosis not present

## 2023-03-25 DIAGNOSIS — R69 Illness, unspecified: Secondary | ICD-10-CM | POA: Diagnosis not present

## 2023-03-25 DIAGNOSIS — C50412 Malignant neoplasm of upper-outer quadrant of left female breast: Secondary | ICD-10-CM | POA: Diagnosis not present

## 2023-03-25 NOTE — Research (Signed)
Note was imported from the encounter it was incorrectly attached to:   TRIAL S2205, ICE COMPRESS: RANDOMIZED TRIAL OF LIMB CRYOCOMPRESSION VERSUS CONTINUOUS COMPRESSION VERSUS LOW CYCLIC COMPRESSION FOR THE PREVENTION  OF TAXANE-INDUCED PERIPHERAL NEUROPATHY   Patient arrives today Unaccompanied for the Cycle 5 visit. Per study, this is week 12.    PROs: Per study protocol, all PROs required for this visit were completed prior to other study activities and completeness has been verified.     LABS: Mandatory and optional labs are collected per consent and study protocol: [name] tolerated well without complaint.   MD/PROVIDER VISIT: Patient sees Dr Pamelia Hoit for today's visit.   ADVERSE EVENTS: [name] reports AE as below. Attribution per Dr Pamelia Hoit.   ADVERSE EVENT LOG:  Tonya Vincent [id number]   03/21/2023   Adverse Event Log     Event Grade Onset Date Resolved Date Drug Name Attribution Treatment Comments  Fall Grade 1 02/14/23 02/14/23 N/a Unrelated to device; definitely related to taxane N/a Pt tripped and caught herself but did hit the ground  Peripheral sensory neuropathy Grade 1 02/10/23  03/20/23 N/a Unrelated to device, definitely related to taxane N/a Fingers are slightly numb  Vascular access complications Grade 3 01/29/23   N/a Unrelated Anticoagulation    Peripheral sensory neuropathy Grade 2 03/21/23     Unrelated to device, definitely related to taxane Stopping taxane; will not get treatment going forward Patient reports feeling tingling and numb all over her body                                        NEURO ASSESSMENT: The neuro assessment was completed by this nurse along with Juanita Laster, RN. [name] tolerated all testing without complaint.    GIFT CARD: This study does not provide visit compensation.    DISPOSITION: Upon completion off all study requirements, patient was escorted to infusion, where she will  receive treatement.    The patient was thanked for their time and continued voluntary participation in this study. [name] has been provided direct contact information and is encouraged to contact this Nurse for any needs or questions.   Margret Chance Christy Friede, RN, BSN, [facility] She  Her  Hers Clinical Research Nurse [facility] Direct Dial [phone]  Pager [phone] 03/21/2023 1:13 PM    Margret Chance. Hal Norrington, RN, BSN, [facility] She  Her  Hers Clinical Research Nurse [facility] Direct Dial [phone]  Pager [phone] 03/25/2023 8:59 AM

## 2023-03-28 ENCOUNTER — Ambulatory Visit (HOSPITAL_COMMUNITY)
Admission: RE | Admit: 2023-03-28 | Discharge: 2023-03-28 | Disposition: A | Discharge status: Home / Self Care | Payer: Medicare HMO | Source: Ambulatory Visit | Attending: Hematology and Oncology | Admitting: Hematology and Oncology

## 2023-03-28 DIAGNOSIS — Z79899 Other long term (current) drug therapy: Secondary | ICD-10-CM

## 2023-03-28 DIAGNOSIS — Z5181 Encounter for therapeutic drug level monitoring: Secondary | ICD-10-CM | POA: Diagnosis not present

## 2023-03-28 DIAGNOSIS — I5030 Unspecified diastolic (congestive) heart failure: Secondary | ICD-10-CM

## 2023-03-28 DIAGNOSIS — I119 Hypertensive heart disease without heart failure: Secondary | ICD-10-CM | POA: Diagnosis not present

## 2023-03-28 LAB — ECHOCARDIOGRAM COMPLETE
Area-P 1/2: 4.86 cm2
S' Lateral: 2.9 cm

## 2023-03-28 NOTE — Progress Notes (Signed)
  Echocardiogram 2D Echocardiogram has been performed.  Delcie Roch 03/28/2023, 4:00 PM

## 2023-04-01 ENCOUNTER — Telehealth: Payer: Self-pay | Admitting: Hematology and Oncology

## 2023-04-01 ENCOUNTER — Other Ambulatory Visit: Payer: Self-pay | Admitting: *Deleted

## 2023-04-01 MED ORDER — RIVAROXABAN 20 MG PO TABS: 20.0000 mg | ORAL_TABLET | Freq: Every day | ORAL | 3 refills | Status: DC

## 2023-04-01 MED ORDER — RIVAROXABAN 20 MG PO TABS: 20.0000 mg | ORAL_TABLET | Freq: Every day | ORAL | 6 refills | Status: DC

## 2023-04-01 NOTE — Telephone Encounter (Signed)
patient called to verify time of 5/9 appointments.

## 2023-04-02 ENCOUNTER — Encounter: Payer: Self-pay | Admitting: Hematology and Oncology

## 2023-04-05 ENCOUNTER — Telehealth: Payer: Self-pay | Admitting: *Deleted

## 2023-04-05 ENCOUNTER — Encounter: Payer: Self-pay | Admitting: *Deleted

## 2023-04-06 NOTE — Progress Notes (Signed)
Patient Care Team: [name], MD as PCP - General (Family Medicine) [name], MD as Consulting Physician (Hematology and Oncology) Rockwell Germany, RN as Oncology Nurse Navigator Mauro Kaufmann, RN as Oncology Nurse Navigator Nicholas Lose, MD as Consulting Physician (Hematology and Oncology) Coralie Keens, MD as Consulting Physician (General Surgery) Gery Pray, MD as Consulting Physician (Radiation Oncology)  DIAGNOSIS: No diagnosis found.  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 after neoadjuvant chemotherapy and radiation therapy   Primary site: Colon and Rectum   Staging method: AJCC 7th Edition   Clinical: Stage I (T2, N0, M0) signed by [name], MD on 12/28/2013  1:49 PM   Pathologic: Stage I (T2, N0, cM0) signed by [name], MD on 12/28/2013  1:49 PM   Summary: Stage I (T2, N0, cM0)     History of rectal cancer  11/17/2009 Procedure   Colonoscopy and rectal biopsy confirmed moderately differentiated adenocarcinoma   11/18/2009 Imaging   Transrectal ultrasound place staging T3 lesion   11/18/2009 Imaging   Staging CT scan of the chest, abdomen and pelvis showed multiple lesions in the liver as well as paraspinal mass of unknown etiology   12/05/2009 - 04/03/2010 Chemotherapy   The patient completed new adjuvant chemotherapy with 5-FU and radiation therapy   12/14/2009 Imaging   PET CT scan show faint metabolic activity in the paraspinal mass with no activity in the liver   04/24/2010 Surgery   She had surgical resection with negative margins. Final pathology was T2, N0, M0 (down staged by chemoradiation therapy from T3, N0, M0)   11/19/2012 Imaging   MRI of the liver confirmed benign hemangioma   01/08/2014 Procedure   Colonoscopy and biopsy was negative   12/21/2014 Imaging   CT scan showed no recurrence of colon cancer. Incidentally, paraspinal mass is slightly  larger.   10/04/2016 Procedure   She had repeat colonoscopy which showed two 3 mm polyps in the transverse colon, removed with a cold snare. Resected and retrieved. The examination was otherwise normal on direct and retroflexion views.   10/04/2016 Pathology Results   Surgical [P], transverse, polyp(s) - SERRATED POLYP WITH FOCAL FEATURES SUGGESTIVE OF SESSILE SERRATED POLYP/ADENOMA, ONE FRAGMENT. - HYPERPLASTIC POLYP WITHOUT DYSPLASIA, ONE FRAGMENT. - BENIGN COLORECTAL MUCOSA WITHOUT DYSPLASIA, ONE FRAGMENT. - SEE COMMENT. Microscopic Comment After routine specimen processing, three fragments are identified. One of the fragments demonstrates a serrated horizontally situated crypt, which is suggestive of superficial sampling of a sessile serrated polyp/adenoma (the differential includes another fragment of hyperplastic polyp). Although this is the case, the finding is very focal and not definitive.    11/09/2022 Genetic Testing   Negative genetics for [facility] Multi-Cancer +RNA Panel.  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 Report date is 11/09/2022.   The Multi-Cancer + RNA Panel offered by [facility] includes sequencing and/or deletion/duplication analysis of the following 70 genes:  AIP*, ALK, APC*, ATM*, AXIN2*, BAP1*, BARD1*, BLM*, BMPR1A*, BRCA1*, BRCA2*, BRIP1*, CDC73*, CDH1*, CDK4, CDKN1B*, CDKN2A, CHEK2*, CTNNA1*, DICER1*, EPCAM (del/dup only), EGFR, FH*, FLCN*, GREM1 (promoter dup only), HOXB13, KIT, LZTR1, MAX*, MBD4, MEN1*, MET, MITF, MLH1*, MSH2*, MSH3*, MSH6*, MUTYH*, NF1*, NF2*, NTHL1*, PALB2*, PDGFRA, PMS2*, POLD1*, POLE*, POT1*, PRKAR1A*, PTCH1*, PTEN*, RAD51C*, RAD51D*, RB1*, RET, SDHA* (sequencing only), SDHAF2*, SDHB*, SDHC*, SDHD*, SMAD4*, SMARCA4*, SMARCB1*, SMARCE1*, STK11*, SUFU*, TMEM127*, TP53*, TSC1*, TSC2*, VHL*. RNA analysis is performed for * genes.    11/09/2022 Genetic Testing  Negative genetics for [facility] Multi-Cancer +RNA Panel.  Variant of  uncertain significance in KIT at c.839C>T (p.Ala280Val). Report date is 11/09/2022.   The Multi-Cancer + RNA Panel offered by [facility] includes sequencing and/or deletion/duplication analysis of the following 70 genes:  AIP*, ALK, APC*, ATM*, AXIN2*, BAP1*, BARD1*, BLM*, BMPR1A*, BRCA1*, BRCA2*, BRIP1*, CDC73*, CDH1*, CDK4, CDKN1B*, CDKN2A, CHEK2*, CTNNA1*, DICER1*, EPCAM (del/dup only), EGFR, FH*, FLCN*, GREM1 (promoter dup only), HOXB13, KIT, LZTR1, MAX*, MBD4, MEN1*, MET, MITF, MLH1*, MSH2*, MSH3*, MSH6*, MUTYH*, NF1*, NF2*, NTHL1*, PALB2*, PDGFRA, PMS2*, POLD1*, POLE*, POT1*, PRKAR1A*, PTCH1*, PTEN*, RAD51C*, RAD51D*, RB1*, RET, SDHA* (sequencing only), SDHAF2*, SDHB*, SDHC*, SDHD*, SMAD4*, SMARCA4*, SMARCB1*, SMARCE1*, STK11*, SUFU*, TMEM127*, TP53*, TSC1*, TSC2*, VHL*. RNA analysis is performed for * genes.    Malignant neoplasm of upper-outer quadrant of left breast in female, estrogen receptor positive (HCC)  10/17/2022 Initial Diagnosis   Diffuse bilateral breast pains.  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 Report date is 11/09/2022.   The Multi-Cancer + RNA Panel offered by [facility] includes sequencing and/or deletion/duplication analysis of the following 70 genes:  AIP*, ALK, APC*, ATM*, AXIN2*, BAP1*, BARD1*,  BLM*, BMPR1A*, BRCA1*, BRCA2*, BRIP1*, CDC73*, CDH1*, CDK4, CDKN1B*, CDKN2A, CHEK2*, CTNNA1*, DICER1*, EPCAM (del/dup only), EGFR, FH*, FLCN*, GREM1 (promoter dup only), HOXB13, KIT, LZTR1, MAX*, MBD4, MEN1*, MET, MITF, MLH1*, MSH2*, MSH3*, MSH6*, MUTYH*, NF1*, NF2*, NTHL1*, PALB2*, PDGFRA, PMS2*, POLD1*, POLE*, POT1*, PRKAR1A*, PTCH1*, PTEN*, RAD51C*, RAD51D*, RB1*, RET, SDHA* (sequencing only), SDHAF2*, SDHB*, SDHC*, SDHD*, SMAD4*, SMARCA4*, SMARCB1*, SMARCE1*, STK11*, SUFU*, TMEM127*, TP53*, TSC1*, TSC2*, VHL*. RNA analysis is performed for * genes.    11/14/2022 Surgery   Left lumpectomy: Grade 2 ILC with LCIS 3.5 cm, 1/8 lymph node with micrometastases ER 100%, PR 40%, Ki-67 20%, HER2 3+ positive; lymph node prognostic panel: ER 100% PR 20% Ki-67 10%, HER2 2+ by IHC, FISH negative ratio 1.14   Malignant neoplasm of lower-outer quadrant of left breast of female, estrogen receptor positive (HCC)  10/30/2022 Initial Diagnosis   Malignant neoplasm of lower-outer quadrant of left breast of female, estrogen receptor positive (HCC)   12/27/2022 -  Chemotherapy   Patient is on Treatment Plan : BREAST Docetaxel + Carboplatin + Trastuzumab (TCH) q21d / Trastuzumab q21d       CHIEF COMPLIANT: carboplatin and Herceptin.   INTERVAL HISTORY: HARVEST BLEVENS is a 72 year old with above-mentioned history of HER2 positive breast cancer who is currently on carboplatin and Herceptin. She presents to the clinic for a follow-up.     ALLERGIES:  is allergic to advanced hand sanitizer [alcohol], norgesic [orphenadrine-aspirin-caffeine], hydrocodone, oxycodone, and antibacterial hand soap [triclosan].  MEDICATIONS:  Current Outpatient Medications  Medication Sig Dispense Refill   acetaminophen-codeine (TYLENOL #3) 300-30 MG tablet Take 1 tablet by mouth every 6 (six) hours as needed for moderate pain. 25 tablet 0  amLODipine (NORVASC) 10 MG tablet Take 10 mg by mouth at bedtime.      Aromatic Inhalants  (VICKS VAPOINHALER IN) Inhale 1 puff into the lungs daily as needed (congestion).     aspirin EC 81 MG tablet Take 81 mg by mouth at bedtime.      atorvastatin (LIPITOR) 10 MG tablet Take 10 mg by mouth daily.     dexamethasone (DECADRON) 4 MG tablet Take 1 tablet (4 mg total) by mouth daily. Take 1 tablet day before chemo and 1 tablet day after chemo with food 8 tablet 0   docusate sodium (COLACE) 100 MG capsule Take 100-400 mg by mouth daily as needed for mild constipation.     empagliflozin (JARDIANCE) 25 MG TABS tablet Take 25 mg by mouth daily with supper.      ibuprofen (ADVIL) 800 MG tablet Take 800 mg by mouth every 8 (eight) hours as needed for moderate pain.     latanoprost (XALATAN) 0.005 % ophthalmic solution Place 1 drop into both eyes at bedtime.      lidocaine-prilocaine (EMLA) cream Apply to affected area once 30 g 3   loperamide (IMODIUM A-D) 2 MG tablet Take 2 mg by mouth 4 (four) times daily as needed for diarrhea or loose stools.     loratadine (CLARITIN REDITABS) 10 MG dissolvable tablet Take 10 mg by mouth daily.     olmesartan (BENICAR) 40 MG tablet Take 40 mg by mouth daily.     ondansetron (ZOFRAN) 8 MG tablet Take 1 tablet (8 mg total) by mouth every 8 (eight) hours as needed for nausea or vomiting. Start on the third day after chemotherapy. 30 tablet 1   oxymetazoline (AFRIN) 0.05 % nasal spray Place 1 spray into both nostrils 2 (two) times daily.     prochlorperazine (COMPAZINE) 10 MG tablet Take 1 tablet (10 mg total) by mouth every 6 (six) hours as needed for nausea or vomiting. 30 tablet 1   rivaroxaban (XARELTO) 20 MG TABS tablet Take 1 tablet (20 mg total) by mouth daily with supper. 90 tablet 3   Semaglutide, 1 MG/DOSE, 2 MG/1.5ML SOPN Inject 1 mg into the skin every Sunday.     spironolactone (ALDACTONE) 25 MG tablet Take 50 mg by mouth daily.     vortioxetine HBr (TRINTELLIX) 10 MG TABS tablet Take 10 mg by mouth daily with supper.     No current  facility-administered medications for this visit.    PHYSICAL EXAMINATION: ECOG PERFORMANCE STATUS: {CHL ONC ECOG PS:[phone]}  There were no vitals filed for this visit. There were no vitals filed for this visit.  BREAST:*** No palpable masses or nodules in either right or left breasts. No palpable axillary supraclavicular or infraclavicular adenopathy no breast tenderness or nipple discharge. (exam performed in the presence of a chaperone)  LABORATORY DATA:  I have reviewed the data as listed    Latest Ref Rng & Units 03/21/2023    9:53 AM 02/28/2023    8:58 AM 01/30/2023   10:08 PM  CMP  Glucose 70 - 99 mg/dL 89  413  244   BUN 8 - 23 mg/dL 13  14  13    Creatinine 0.44 - 1.00 mg/dL 0.10  2.72  5.36   Sodium 135 - 145 mmol/L 139  139  136   Potassium 3.5 - 5.1 mmol/L 4.0  3.9  4.3   Chloride 98 - 111 mmol/L 106  105  106   CO2 22 - 32 mmol/L 25  25  22   Calcium 8.9 - 10.3 mg/dL 9.6  9.4  8.4   Total Protein 6.5 - 8.1 g/dL 7.6  7.6    Total Bilirubin 0.3 - 1.2 mg/dL 0.3  0.4    Alkaline Phos 38 - 126 U/L 89  95    AST 15 - 41 U/L 16  12    ALT 0 - 44 U/L 15  12      Lab Results  Component Value Date   WBC 5.3 03/21/2023   HGB 11.4 (L) 03/21/2023   HCT 35.0 (L) 03/21/2023   MCV 86.2 03/21/2023   PLT 251 03/21/2023   NEUTROABS 3.0 03/21/2023    ASSESSMENT & PLAN:  No problem-specific Assessment & Plan notes found for this encounter.    No orders of the defined types were placed in this encounter.  The patient has a good understanding of the overall plan. she agrees with it. she will call with any problems that may develop before the next visit here. Total time spent: 30 mins including face to face time and time spent for planning, charting and co-ordination of care   Sherlyn Lick, CMA 04/06/23    I Janan Ridge am acting as a Neurosurgeon for [employer]  ***

## 2023-04-08 ENCOUNTER — Other Ambulatory Visit: Payer: Self-pay | Admitting: Radiology

## 2023-04-08 NOTE — H&P (Signed)
Referring Physician(s): Gudena,V  Supervising Physician: Simonne Come  Patient Status:  [facility] OP  Chief Complaint: Poorly functioning port a cath   Subjective: Patient known to IR service for Port-A-Cath injections in 2011 and on 03/22/23.  She is a 72 year old female with history of rectal cancer 2010, left breast cancer 2023 with lumpectomy.  Additional medical history significant for ADHD, anemia, anxiety, arthritis, depression, diabetes, hypertension.  She underwent left subclavian Port-A-Cath placement by surgery in 2011 which was incompletely removed on 02/17/2019 with retained catheter fragment at the level of the left innominate vein.  She subsequently underwent new right IJ Port-A-Cath placement by surgery on 12/17/2022.  Recent Port-A-Cath injection on 03/22/2023 secondary to malfunction revealed port a cath tubing located within the caudal aspect of the IVC with associated occlusion of the central aspect of the IVC.  She presents today for port a cath revision/new placement. She denies fever, HA,CP,dyspnea, cough, abd/back pain,N/V or bleeding.      Past Medical History:  Diagnosis Date   ADHD (attention deficit hyperactivity disorder)    Anemia    Anxiety    Arthritis    Breast cancer (HCC) 10/2022   left breast ILC   Complication of anesthesia    difficulty waking up   Depression    Diabetes mellitus    type 2 DM   Dyspnea    with exertion   Heart murmur    History of rectal cancer 10/04/2011   Hypertension    Overactive bladder    Paraspinal mass 12/29/2014   Rectal cancer (HCC) dx'd 11/16/09   xrt comp 01/2010; chemo comp 01/2011   Tubular adenoma of colon 01/08/2014   Past Surgical History:  Procedure Laterality Date   ABDOMINAL HYSTERECTOMY     for uterine fibroids   BREAST LUMPECTOMY WITH RADIOACTIVE SEED LOCALIZATION Left 11/14/2022   Procedure: LEFT BREAST RADIOACTIVE SEED GUIDED LUMPECTOMY x2;  Surgeon: Abigail Miyamoto, MD;  Location: [facility];  Service: General;  Laterality: Left;   COLON SURGERY N/A 04/24/2010   "took out half rectum"; rectal cancer   EYE SURGERY     lasik surgery   HAND SURGERY     carpal tunnel- left   HEMORRHOID SURGERY N/A 1980   IR CV LINE INJECTION  03/22/2023   ORIF ANKLE FRACTURE Right 04/11/2020   Procedure: OPEN REDUCTION INTERNAL FIXATION (ORIF) ANKLE FRACTURE;  Surgeon: Vickki Hearing, MD;  Location: [facility];  Service: Orthopedics;  Laterality: Right;   PORT-A-CATH REMOVAL N/A 02/17/2019   Procedure: REMOVAL PORT-A-CATH;  Surgeon: Abigail Miyamoto, MD;  Location: [facility] OR;  Service: General;  Laterality: N/A;   PORTACATH PLACEMENT     2011   PORTACATH PLACEMENT N/A 12/17/2022   Procedure: INSERTION PORT-A-CATH;  Surgeon: Abigail Miyamoto, MD;  Location: [facility] OR;  Service: General;  Laterality: N/A;   RECTAL SURGERY     2010   SENTINEL NODE BIOPSY Left 11/14/2022   Procedure: SENTINEL LYMPH NODE BIOPSY;  Surgeon: Abigail Miyamoto, MD;  Location: [facility];  Service: General;  Laterality: Left;   WISDOM TOOTH EXTRACTION        Allergies: Advanced hand sanitizer [alcohol], Norgesic [orphenadrine-aspirin-caffeine], Hydrocodone, Oxycodone, and Antibacterial hand soap [triclosan]  Medications: Prior to Admission medications   Medication Sig Start Date End Date Taking? Authorizing Provider  acetaminophen-codeine (TYLENOL #3) 300-30 MG tablet Take 1 tablet by mouth every 6 (six) hours as needed for moderate pain. 12/17/22   Abigail Miyamoto, MD  amLODipine (  NORVASC) 10 MG tablet Take 10 mg by mouth at bedtime.  01/24/19   [provider]  Aromatic Inhalants (VICKS VAPOINHALER IN) Inhale 1 puff into the lungs daily as needed (congestion).    [provider]  aspirin EC 81 MG tablet Take 81 mg by mouth at bedtime.     [provider]  atorvastatin (LIPITOR) 10 MG tablet Take 10 mg by mouth daily. 09/30/22   [provider]   dexamethasone (DECADRON) 4 MG tablet Take 1 tablet (4 mg total) by mouth daily. Take 1 tablet day before chemo and 1 tablet day after chemo with food 12/06/22   [name], MD  docusate sodium (COLACE) 100 MG capsule Take 100-400 mg by mouth daily as needed for mild constipation.    [provider]  empagliflozin (JARDIANCE) 25 MG TABS tablet Take 25 mg by mouth daily with supper.     [provider]  ibuprofen (ADVIL) 800 MG tablet Take 800 mg by mouth every 8 (eight) hours as needed for moderate pain.    [provider]  latanoprost (XALATAN) 0.005 % ophthalmic solution Place 1 drop into both eyes at bedtime.     [provider]  lidocaine-prilocaine (EMLA) cream Apply to affected area once 12/06/22   [name], MD  loperamide (IMODIUM A-D) 2 MG tablet Take 2 mg by mouth 4 (four) times daily as needed for diarrhea or loose stools.    [provider]  loratadine (CLARITIN REDITABS) 10 MG dissolvable tablet Take 10 mg by mouth daily.    [provider]  olmesartan (BENICAR) 40 MG tablet Take 40 mg by mouth daily. 02/28/21   [provider]  ondansetron (ZOFRAN) 8 MG tablet Take 1 tablet (8 mg total) by mouth every 8 (eight) hours as needed for nausea or vomiting. Start on the third day after chemotherapy. 12/06/22   [name], MD  oxymetazoline (AFRIN) 0.05 % nasal spray Place 1 spray into both nostrils 2 (two) times daily.    [provider]  prochlorperazine (COMPAZINE) 10 MG tablet Take 1 tablet (10 mg total) by mouth every 6 (six) hours as needed for nausea or vomiting. 12/06/22   [name], MD  rivaroxaban (XARELTO) 20 MG TABS tablet Take 1 tablet (20 mg total) by mouth daily with supper. 04/01/23   [name], MD  Semaglutide, 1 MG/DOSE, 2 MG/1.5ML SOPN Inject 1 mg into the skin every Sunday.    [provider]  spironolactone (ALDACTONE) 25 MG tablet Take 50 mg by mouth daily. 02/28/21   [provider]  vortioxetine HBr (TRINTELLIX) 10 MG TABS tablet Take 10 mg by mouth daily with supper.    [provider]     Vital Signs: Vitals:   04/09/23 0749  BP: (!) 150/85  Pulse: 82  Resp: 16  Temp: 98.7 F (37.1 C)  SpO2: 97%         Code Status: FULL CODE  Physical Exam: awake/alert; chest- CTA bilat; clean, intact right chest port a cath; heart- RRR; abd- soft,+BS,NT; no LE edema  Imaging: No results found.  Labs:  CBC: Recent Labs    01/18/23 1340 01/30/23 2208 02/28/23 0858 03/21/23 0953  WBC 9.2 10.2 6.1 5.3  HGB 10.6* 10.9* 11.6* 11.4*  HCT 32.5* 35.1* 36.6 35.0*  PLT 310 239 312 251    COAGS: Recent Labs    01/30/23 2208  INR 1.0    BMP: Recent Labs    01/18/23 1340  01/30/23 2208 02/28/23 0858 03/21/23 0953  NA 138 136 139 139  K 4.1 4.3 3.9 4.0  CL 105 106 105 106  CO2 25 22 25 25   GLUCOSE 231* 156* 143* 89  BUN 20 13 14 13   CALCIUM 8.0* 8.4* 9.4 9.6  CREATININE 0.86 1.20* 1.07* 0.98  GFRNONAA >60 49* 56* >60    LIVER FUNCTION TESTS: Recent Labs    01/17/23 1028 01/18/23 1340 02/28/23 0858 03/21/23 0953  BILITOT 0.2* 0.3 0.4 0.3  AST 19 25 12* 16  ALT 38 54* 12 15  ALKPHOS 87 89 95 89  PROT 6.4* 6.6 7.6 7.6  ALBUMIN 3.7 3.9 4.3 4.4    Assessment and Plan: 72 year old female with history of rectal cancer 2010, left breast cancer 2023 with lumpectomy.  Additional medical history significant for ADHD, anemia, anxiety, arthritis, depression, diabetes, hypertension.  She underwent left subclavian Port-A-Cath placement by surgery in 2011 which was incompletely removed on 02/17/2019 with retained catheter fragment at the level of the left innominate vein.  She subsequently underwent new right IJ Port-A-Cath placement by surgery on 12/17/2022.  Recent Port-A-Cath injection on 03/22/2023 secondary to malfunction revealed port a cath tubing located within the caudal aspect of the IVC with associated occlusion of the  central aspect of the IVC.  She presents today for port a cath revision/replacement.Risks and benefits of image guided port-a-catheter replacement was discussed with the patient including, but not limited to bleeding, infection, pneumothorax, or fibrin sheath development and need for additional procedures.  All of the patient's questions were answered, patient is agreeable to proceed. Consent signed and in chart.    Electronically Signed: D. Jeananne Rama, PA-C 04/08/2023, 2:58 PM   I spent a total of 20 minutes at the the patient's bedside AND on the patient's hospital floor or unit, greater than 50% of which was counseling/coordinating care for Port-A-Cath revision/replacement

## 2023-04-08 NOTE — Progress Notes (Unsigned)
Pt attended 03/19/23 screening event were b/p 171/93 and blood glucose was 107. At event, pt confirmed her PCP is Renaye Rakers, MD, whose documentation is not visible in [facility] so unable to confirm most recent encounter or any future appt. Event Nurse documented on the health screening form that she instructed pt to continue to follow up with MD and take meds today to take same time daily.Per chart review pt has multiple ongoing appt with oncology, the most recent being 03/22/23 and multiple future appt for ongoing care from 04/11/23 through June, 2024. Per patient call, pt noted her housing SDOH concern related to mold in her home. Pt also shared that she may not qualify for mold assistance at Social Services in her area-due to her income. This Care Guide made several attempts to locate a program in her area for mold, none are available. Pt also stated that her medicine is expensive even with her medical insurance. PT. was mailed a copy of the [facility] and Human Services-health Department address and telephone number. BP discussed/SDOH flyer provided. Perley Jain, CCG

## 2023-04-09 ENCOUNTER — Other Ambulatory Visit: Payer: Self-pay

## 2023-04-09 ENCOUNTER — Encounter (HOSPITAL_COMMUNITY): Payer: Self-pay

## 2023-04-09 ENCOUNTER — Ambulatory Visit (HOSPITAL_COMMUNITY)
Admission: RE | Admit: 2023-04-09 | Discharge: 2023-04-09 | Disposition: A | Discharge status: Home / Self Care | Payer: Medicare HMO | Source: Ambulatory Visit | Attending: Interventional Radiology | Admitting: Interventional Radiology

## 2023-04-09 ENCOUNTER — Other Ambulatory Visit (HOSPITAL_COMMUNITY): Payer: Self-pay | Admitting: Interventional Radiology

## 2023-04-09 DIAGNOSIS — Z452 Encounter for adjustment and management of vascular access device: Secondary | ICD-10-CM | POA: Diagnosis not present

## 2023-04-09 DIAGNOSIS — Z853 Personal history of malignant neoplasm of breast: Secondary | ICD-10-CM | POA: Insufficient documentation

## 2023-04-09 DIAGNOSIS — F32A Depression, unspecified: Secondary | ICD-10-CM | POA: Diagnosis not present

## 2023-04-09 DIAGNOSIS — E222 Syndrome of inappropriate secretion of antidiuretic hormone: Secondary | ICD-10-CM

## 2023-04-09 DIAGNOSIS — I10 Essential (primary) hypertension: Secondary | ICD-10-CM | POA: Diagnosis not present

## 2023-04-09 DIAGNOSIS — Z85048 Personal history of other malignant neoplasm of rectum, rectosigmoid junction, and anus: Secondary | ICD-10-CM | POA: Insufficient documentation

## 2023-04-09 DIAGNOSIS — F909 Attention-deficit hyperactivity disorder, unspecified type: Secondary | ICD-10-CM | POA: Insufficient documentation

## 2023-04-09 DIAGNOSIS — Z4682 Encounter for fitting and adjustment of non-vascular catheter: Secondary | ICD-10-CM | POA: Diagnosis not present

## 2023-04-09 DIAGNOSIS — D649 Anemia, unspecified: Secondary | ICD-10-CM | POA: Insufficient documentation

## 2023-04-09 DIAGNOSIS — F419 Anxiety disorder, unspecified: Secondary | ICD-10-CM | POA: Insufficient documentation

## 2023-04-09 DIAGNOSIS — T82598A Other mechanical complication of other cardiac and vascular devices and implants, initial encounter: Secondary | ICD-10-CM | POA: Diagnosis not present

## 2023-04-09 DIAGNOSIS — E119 Type 2 diabetes mellitus without complications: Secondary | ICD-10-CM | POA: Insufficient documentation

## 2023-04-09 HISTORY — PX: IR REMOVAL TUN ACCESS W/ PORT W/O FL MOD SED: IMG2290

## 2023-04-09 HISTORY — PX: IR IMAGING GUIDED PORT INSERTION: IMG5740

## 2023-04-09 LAB — GLUCOSE, CAPILLARY: Glucose-Capillary: 102 mg/dL — ABNORMAL HIGH (ref 70–99)

## 2023-04-09 MED ORDER — HEPARIN SOD (PORK) LOCK FLUSH 100 UNIT/ML IV SOLN
INTRAVENOUS | Status: AC | PRN
  Administered 2023-04-09: 500 [IU]

## 2023-04-09 MED ORDER — SODIUM CHLORIDE 0.9 % IV SOLN: INTRAVENOUS | Status: DC

## 2023-04-09 MED ORDER — LIDOCAINE-EPINEPHRINE (PF) 1 %-1:200000 IJ SOLN
INTRAMUSCULAR | Status: AC | PRN
  Administered 2023-04-09: 20 mL

## 2023-04-09 MED ORDER — LIDOCAINE-EPINEPHRINE 1 %-1:100000 IJ SOLN
INTRAMUSCULAR | Status: AC
  Filled 2023-04-09: qty 2

## 2023-04-09 MED ORDER — HEPARIN SOD (PORK) LOCK FLUSH 100 UNIT/ML IV SOLN
INTRAVENOUS | Status: AC
  Filled 2023-04-09: qty 5

## 2023-04-09 MED ORDER — FENTANYL CITRATE (PF) 100 MCG/2ML IJ SOLN
INTRAMUSCULAR | Status: AC | PRN
  Administered 2023-04-09 (×4): 50 ug via INTRAVENOUS

## 2023-04-09 MED ORDER — MIDAZOLAM HCL 2 MG/2ML IJ SOLN
INTRAMUSCULAR | Status: AC
  Filled 2023-04-09: qty 4

## 2023-04-09 MED ORDER — FENTANYL CITRATE (PF) 100 MCG/2ML IJ SOLN
INTRAMUSCULAR | Status: AC
  Filled 2023-04-09: qty 4

## 2023-04-09 MED ORDER — CEFAZOLIN SODIUM-DEXTROSE 2-4 GM/100ML-% IV SOLN
2.0000 g | INTRAVENOUS | Status: AC
  Administered 2023-04-09: 2 g via INTRAVENOUS

## 2023-04-09 MED ORDER — MIDAZOLAM HCL 2 MG/2ML IJ SOLN
INTRAMUSCULAR | Status: AC | PRN
  Administered 2023-04-09 (×4): 1 mg via INTRAVENOUS

## 2023-04-09 MED ORDER — CEFAZOLIN SODIUM-DEXTROSE 2-4 GM/100ML-% IV SOLN
INTRAVENOUS | Status: AC
  Filled 2023-04-09: qty 100

## 2023-04-09 NOTE — Discharge Instructions (Signed)

## 2023-04-09 NOTE — Procedures (Signed)
Pre Procedure Dx: Port malfunction Post Procedural Dx: Same  Successful replacement of right subclavian via supraclavicular approach port-a-cath with tip at the superior caval atrial junction. The catheter is ready for immediate use.  Estimated Blood Loss: Trace  Complications: None immediate.  Katherina Right, MD Pager #: [phone]

## 2023-04-10 MED FILL — Fosaprepitant Dimeglumine For IV Infusion 150 MG (Base Eq): INTRAVENOUS | Qty: 5 | Status: AC

## 2023-04-10 NOTE — Telephone Encounter (Signed)
Phone encounter entered in error by Perley Jain, CCG -  info in community event f/u note

## 2023-04-11 ENCOUNTER — Other Ambulatory Visit: Payer: Self-pay

## 2023-04-11 ENCOUNTER — Inpatient Hospital Stay: Payer: Medicare HMO | Attending: Hematology and Oncology

## 2023-04-11 ENCOUNTER — Inpatient Hospital Stay: Payer: Medicare HMO

## 2023-04-11 ENCOUNTER — Other Ambulatory Visit: Payer: Self-pay | Admitting: *Deleted

## 2023-04-11 ENCOUNTER — Encounter: Payer: Self-pay | Admitting: *Deleted

## 2023-04-11 ENCOUNTER — Inpatient Hospital Stay (HOSPITAL_BASED_OUTPATIENT_CLINIC_OR_DEPARTMENT_OTHER): Payer: Medicare HMO | Admitting: Hematology and Oncology

## 2023-04-11 VITALS — BP 173/78 | HR 73 | Temp 97.4°F | Resp 17 | Ht 63.0 in | Wt 187.8 lb

## 2023-04-11 VITALS — BP 161/71 | HR 70 | Temp 97.5°F | Resp 18

## 2023-04-11 DIAGNOSIS — Z95828 Presence of other vascular implants and grafts: Secondary | ICD-10-CM

## 2023-04-11 DIAGNOSIS — Z170 Estrogen receptor positive status [ER+]: Secondary | ICD-10-CM | POA: Insufficient documentation

## 2023-04-11 DIAGNOSIS — C50412 Malignant neoplasm of upper-outer quadrant of left female breast: Secondary | ICD-10-CM | POA: Diagnosis not present

## 2023-04-11 DIAGNOSIS — C50512 Malignant neoplasm of lower-outer quadrant of left female breast: Secondary | ICD-10-CM

## 2023-04-11 DIAGNOSIS — Z79899 Other long term (current) drug therapy: Secondary | ICD-10-CM | POA: Diagnosis not present

## 2023-04-11 DIAGNOSIS — Z5111 Encounter for antineoplastic chemotherapy: Secondary | ICD-10-CM | POA: Insufficient documentation

## 2023-04-11 LAB — CBC WITH DIFFERENTIAL (CANCER CENTER ONLY)
Abs Immature Granulocytes: 0.01 10*3/uL (ref 0.00–0.07)
Abs Immature Granulocytes: 0.01 10*3/uL (ref 0.00–0.07)
Basophils Absolute: 0 10*3/uL (ref 0.0–0.1)
Basophils Absolute: 0 10*3/uL (ref 0.0–0.1)
Basophils Relative: 1 %
Basophils Relative: 0 %
Eosinophils Absolute: 0.1 10*3/uL (ref 0.0–0.5)
Eosinophils Absolute: 0 10*3/uL (ref 0.0–0.5)
Eosinophils Relative: 2 %
Eosinophils Relative: 2 %
HCT: 34.4 % — ABNORMAL LOW (ref 36.0–46.0)
HCT: 33 % — ABNORMAL LOW (ref 36.0–46.0)
Hemoglobin: 11.2 g/dL — ABNORMAL LOW (ref 12.0–15.0)
Hemoglobin: 10.7 g/dL — ABNORMAL LOW (ref 12.0–15.0)
Immature Granulocytes: 0 %
Immature Granulocytes: 0 %
Lymphocytes Relative: 30 %
Lymphocytes Relative: 29 %
Lymphs Abs: 0.8 10*3/uL (ref 0.7–4.0)
Lymphs Abs: 0.7 10*3/uL (ref 0.7–4.0)
MCH: 28.4 pg (ref 26.0–34.0)
MCH: 28.2 pg (ref 26.0–34.0)
MCHC: 32.6 g/dL (ref 30.0–36.0)
MCHC: 32.4 g/dL (ref 30.0–36.0)
MCV: 87.3 fL (ref 80.0–100.0)
MCV: 87.1 fL (ref 80.0–100.0)
Monocytes Absolute: 0.4 10*3/uL (ref 0.1–1.0)
Monocytes Absolute: 0.3 10*3/uL (ref 0.1–1.0)
Monocytes Relative: 13 %
Monocytes Relative: 11 %
Neutro Abs: 1.5 10*3/uL — ABNORMAL LOW (ref 1.7–7.7)
Neutro Abs: 1.4 10*3/uL — ABNORMAL LOW (ref 1.7–7.7)
Neutrophils Relative %: 54 %
Neutrophils Relative %: 58 %
Platelet Count: 77 10*3/uL — ABNORMAL LOW (ref 150–400)
Platelet Count: 91 10*3/uL — ABNORMAL LOW (ref 150–400)
RBC: 3.94 MIL/uL (ref 3.87–5.11)
RBC: 3.79 MIL/uL — ABNORMAL LOW (ref 3.87–5.11)
RDW: 18.6 % — ABNORMAL HIGH (ref 11.5–15.5)
RDW: 18.7 % — ABNORMAL HIGH (ref 11.5–15.5)
Smear Review: NORMAL
WBC Count: 2.8 10*3/uL — ABNORMAL LOW (ref 4.0–10.5)
WBC Count: 2.4 10*3/uL — ABNORMAL LOW (ref 4.0–10.5)
nRBC: 0 % (ref 0.0–0.2)
nRBC: 0 % (ref 0.0–0.2)

## 2023-04-11 LAB — CMP (CANCER CENTER ONLY)
ALT: 13 U/L (ref 0–44)
AST: 13 U/L — ABNORMAL LOW (ref 15–41)
Albumin: 4.4 g/dL (ref 3.5–5.0)
Alkaline Phosphatase: 102 U/L (ref 38–126)
Anion gap: 10 (ref 5–15)
BUN: 14 mg/dL (ref 8–23)
CO2: 23 mmol/L (ref 22–32)
Calcium: 9.3 mg/dL (ref 8.9–10.3)
Chloride: 104 mmol/L (ref 98–111)
Creatinine: 1.03 mg/dL — ABNORMAL HIGH (ref 0.44–1.00)
GFR, Estimated: 58 mL/min — ABNORMAL LOW (ref >60)
Glucose, Bld: 115 mg/dL — ABNORMAL HIGH (ref 70–99)
Potassium: 4.1 mmol/L (ref 3.5–5.1)
Sodium: 137 mmol/L (ref 135–145)
Total Bilirubin: 0.4 mg/dL (ref 0.3–1.2)
Total Protein: 7.7 g/dL (ref 6.5–8.1)

## 2023-04-11 MED ORDER — ACETAMINOPHEN 325 MG PO TABS
650.0000 mg | ORAL_TABLET | Freq: Once | ORAL | Status: AC
  Administered 2023-04-11: 650 mg via ORAL
  Filled 2023-04-11: qty 2

## 2023-04-11 MED ORDER — SODIUM CHLORIDE 0.9 % IV SOLN
150.0000 mg | Freq: Once | INTRAVENOUS | Status: AC
  Administered 2023-04-11: 150 mg via INTRAVENOUS
  Filled 2023-04-11: qty 150

## 2023-04-11 MED ORDER — SODIUM CHLORIDE 0.9 % IV SOLN
383.2000 mg | Freq: Once | INTRAVENOUS | Status: AC
  Administered 2023-04-11: 380 mg via INTRAVENOUS
  Filled 2023-04-11: qty 38

## 2023-04-11 MED ORDER — SODIUM CHLORIDE 0.9% FLUSH
10.0000 mL | INTRAVENOUS | Status: DC | PRN
  Administered 2023-04-11: 10 mL via INTRAVENOUS

## 2023-04-11 MED ORDER — HEPARIN SOD (PORK) LOCK FLUSH 100 UNIT/ML IV SOLN
500.0000 [IU] | Freq: Once | INTRAVENOUS | Status: AC | PRN
  Administered 2023-04-11: 500 [IU]

## 2023-04-11 MED ORDER — SODIUM CHLORIDE 0.9% FLUSH
10.0000 mL | INTRAVENOUS | Status: DC | PRN
  Administered 2023-04-11: 10 mL

## 2023-04-11 MED ORDER — PALONOSETRON HCL INJECTION 0.25 MG/5ML
0.2500 mg | Freq: Once | INTRAVENOUS | Status: AC
  Administered 2023-04-11: 0.25 mg via INTRAVENOUS
  Filled 2023-04-11: qty 5

## 2023-04-11 MED ORDER — TRASTUZUMAB-ANNS CHEMO 150 MG IV SOLR
6.0000 mg/kg | Freq: Once | INTRAVENOUS | Status: AC
  Administered 2023-04-11: 525 mg via INTRAVENOUS
  Filled 2023-04-11: qty 25

## 2023-04-11 MED ORDER — SODIUM CHLORIDE 0.9 % IV SOLN: Freq: Once | INTRAVENOUS | Status: AC

## 2023-04-11 MED ORDER — DIPHENHYDRAMINE HCL 25 MG PO CAPS
50.0000 mg | ORAL_CAPSULE | Freq: Once | ORAL | Status: AC
  Administered 2023-04-11: 50 mg via ORAL
  Filled 2023-04-11: qty 2

## 2023-04-11 MED ORDER — DEXAMETHASONE SODIUM PHOSPHATE 10 MG/ML IJ SOLN
4.0000 mg | Freq: Once | INTRAMUSCULAR | Status: AC
  Administered 2023-04-11: 4 mg via INTRAVENOUS
  Filled 2023-04-11: qty 1

## 2023-04-11 NOTE — Progress Notes (Signed)
Per MD okay to treat with plt 77 and ANC 1.5.

## 2023-04-11 NOTE — Patient Instructions (Signed)
[facility]  Discharge Instructions: Thank you for choosing [facility] to provide your oncology and hematology care.   If you have a lab appointment with the Cancer Center, please go directly to the Cancer Center and check in at the registration area.   Wear comfortable clothing and clothing appropriate for easy access to any Portacath or PICC line.   We strive to give you quality time with your provider. You may need to reschedule your appointment if you arrive late (15 or more minutes).  Arriving late affects you and other patients whose appointments are after yours.  Also, if you miss three or more appointments without notifying the office, you may be dismissed from the clinic at the provider's discretion.      For prescription refill requests, have your pharmacy contact our office and allow 72 hours for refills to be completed.    Today you received the following chemotherapy and/or immunotherapy agents: Trastuzumab, Carboplatin      To help prevent nausea and vomiting after your treatment, we encourage you to take your nausea medication as directed.  BELOW ARE SYMPTOMS THAT SHOULD BE REPORTED IMMEDIATELY: *FEVER GREATER THAN 100.4 F (38 C) OR HIGHER *CHILLS OR SWEATING *NAUSEA AND VOMITING THAT IS NOT CONTROLLED WITH YOUR NAUSEA MEDICATION *UNUSUAL SHORTNESS OF BREATH *UNUSUAL BRUISING OR BLEEDING *URINARY PROBLEMS (pain or burning when urinating, or frequent urination) *BOWEL PROBLEMS (unusual diarrhea, constipation, pain near the anus) TENDERNESS IN MOUTH AND THROAT WITH OR WITHOUT PRESENCE OF ULCERS (sore throat, sores in mouth, or a toothache) UNUSUAL RASH, SWELLING OR PAIN  UNUSUAL VAGINAL DISCHARGE OR ITCHING   Items with * indicate a potential emergency and should be followed up as soon as possible or go to the Emergency Department if any problems should occur.  Please show the CHEMOTHERAPY ALERT CARD or IMMUNOTHERAPY  ALERT CARD at check-in to the Emergency Department and triage nurse.  Should you have questions after your visit or need to cancel or reschedule your appointment, please contact [facility]  Dept: [phone]  and follow the prompts.  Office hours are 8:00 a.m. to 4:30 p.m. Monday - Friday. Please note that voicemails left after 4:00 p.m. may not be returned until the following business day.  We are closed weekends and major holidays. You have access to a nurse at all times for urgent questions. Please call the main number to the clinic Dept: [phone] and follow the prompts.   For any non-urgent questions, you may also contact your provider using MyChart. We now offer e-Visits for anyone 18 and older to request care online for non-urgent symptoms. For details visit [email or web address].   Also download the MyChart app! Go to the app store, search "MyChart", open the app, select [facility], and log in with your MyChart username and password.   

## 2023-04-11 NOTE — Assessment & Plan Note (Addendum)
10/17/2022 Diffuse bilateral breast pains.  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 Thrombocytopenia: Platelets 75: Okay to treat for today with a reduced carboplatin dose of AUC 4 ANC 1.4: Okay to treat   Right neck swelling and pain with redness of the right forearm: 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   Return to clinic in 3 weeks for carboplatin and Herceptin.

## 2023-04-11 NOTE — Progress Notes (Signed)
Per MD pt no longer needing Neulasta injection.  Injection appts canceled, pt notified and verbalized understanding.

## 2023-04-13 ENCOUNTER — Inpatient Hospital Stay: Payer: Medicare HMO

## 2023-04-19 NOTE — Addendum Note (Signed)
Encounter addended by: Linnell Fulling on: 04/19/2023 11:53 AM  Actions taken: Imaging Exam ended, Charge Capture section accepted

## 2023-04-22 MED ORDER — PHENYLEPHRINE HCL-NACL 20-0.9 MG/250ML-% IV SOLN
INTRAVENOUS | Status: AC
  Filled 2023-04-22: qty 250

## 2023-04-23 DIAGNOSIS — F423 Hoarding disorder: Secondary | ICD-10-CM | POA: Diagnosis not present

## 2023-04-23 DIAGNOSIS — C50412 Malignant neoplasm of upper-outer quadrant of left female breast: Secondary | ICD-10-CM | POA: Diagnosis not present

## 2023-04-23 DIAGNOSIS — E1169 Type 2 diabetes mellitus with other specified complication: Secondary | ICD-10-CM | POA: Diagnosis not present

## 2023-04-23 DIAGNOSIS — I10 Essential (primary) hypertension: Secondary | ICD-10-CM | POA: Diagnosis not present

## 2023-04-23 DIAGNOSIS — F900 Attention-deficit hyperactivity disorder, predominantly inattentive type: Secondary | ICD-10-CM | POA: Diagnosis not present

## 2023-04-23 DIAGNOSIS — F330 Major depressive disorder, recurrent, mild: Secondary | ICD-10-CM | POA: Diagnosis not present

## 2023-04-26 NOTE — Progress Notes (Signed)
Patient Care Team: Renaye Rakers, MD as PCP - General (Family Medicine) Artis Delay, MD as Consulting Physician (Hematology and Oncology) Donnelly Angelica, RN as Oncology Nurse Navigator Pershing Proud, RN as Oncology Nurse Navigator [name], MD as Consulting Physician (Hematology and Oncology) Abigail Miyamoto, MD as Consulting Physician (General Surgery) Antony Blackbird, MD as Consulting Physician (Radiation Oncology)  DIAGNOSIS: No diagnosis found.  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 after neoadjuvant chemotherapy and radiation therapy   Primary site: Colon and Rectum   Staging method: AJCC 7th Edition   Clinical: Stage I (T2, N0, M0) signed by Artis Delay, MD on 12/28/2013  1:49 PM   Pathologic: Stage I (T2, N0, cM0) signed by Artis Delay, MD on 12/28/2013  1:49 PM   Summary: Stage I (T2, N0, cM0)     History of rectal cancer  11/17/2009 Procedure   Colonoscopy and rectal biopsy confirmed moderately differentiated adenocarcinoma   11/18/2009 Imaging   Transrectal ultrasound place staging T3 lesion   11/18/2009 Imaging   Staging CT scan of the chest, abdomen and pelvis showed multiple lesions in the liver as well as paraspinal mass of unknown etiology   12/05/2009 - 04/03/2010 Chemotherapy   The patient completed new adjuvant chemotherapy with 5-FU and radiation therapy   12/14/2009 Imaging   PET CT scan show faint metabolic activity in the paraspinal mass with no activity in the liver   04/24/2010 Surgery   Tonya Vincent had surgical resection with negative margins. Final pathology was T2, N0, M0 (down staged by chemoradiation therapy from T3, N0, M0)   11/19/2012 Imaging   MRI of the liver confirmed benign hemangioma   01/08/2014 Procedure   Colonoscopy and biopsy was negative   12/21/2014 Imaging   CT scan showed no recurrence of colon cancer. Incidentally, paraspinal mass is slightly  larger.   10/04/2016 Procedure   Tonya Vincent had repeat colonoscopy which showed two 3 mm polyps in the transverse colon, removed with a cold snare. Resected and retrieved. The examination was otherwise normal on direct and retroflexion views.   10/04/2016 Pathology Results   Surgical [P], transverse, polyp(s) - SERRATED POLYP WITH FOCAL FEATURES SUGGESTIVE OF SESSILE SERRATED POLYP/ADENOMA, ONE FRAGMENT. - HYPERPLASTIC POLYP WITHOUT DYSPLASIA, ONE FRAGMENT. - BENIGN COLORECTAL MUCOSA WITHOUT DYSPLASIA, ONE FRAGMENT. - SEE COMMENT. Microscopic Comment After routine specimen processing, three fragments are identified. One of the fragments demonstrates a serrated horizontally situated crypt, which is suggestive of superficial sampling of a sessile serrated polyp/adenoma (the differential includes another fragment of hyperplastic polyp). Although this is the case, the finding is very focal and not definitive.    11/09/2022 Genetic Testing   Negative genetics for [facility] Multi-Cancer +RNA Panel.  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 Report date is 11/09/2022.   The Multi-Cancer + RNA Panel offered by [facility] includes sequencing and/or deletion/duplication analysis of the following 70 genes:  AIP*, ALK, APC*, ATM*, AXIN2*, BAP1*, BARD1*, BLM*, BMPR1A*, BRCA1*, BRCA2*, BRIP1*, CDC73*, CDH1*, CDK4, CDKN1B*, CDKN2A, CHEK2*, CTNNA1*, DICER1*, EPCAM (del/dup only), EGFR, FH*, FLCN*, GREM1 (promoter dup only), HOXB13, KIT, LZTR1, MAX*, MBD4, MEN1*, MET, MITF, MLH1*, MSH2*, MSH3*, MSH6*, MUTYH*, NF1*, NF2*, NTHL1*, PALB2*, PDGFRA, PMS2*, POLD1*, POLE*, POT1*, PRKAR1A*, PTCH1*, PTEN*, RAD51C*, RAD51D*, RB1*, RET, SDHA* (sequencing only), SDHAF2*, SDHB*, SDHC*, SDHD*, SMAD4*, SMARCA4*, SMARCB1*, SMARCE1*, STK11*, SUFU*, TMEM127*, TP53*, TSC1*, TSC2*, VHL*. RNA analysis is performed for * genes.    11/09/2022 Genetic Testing  Negative genetics for [facility] Multi-Cancer +RNA Panel.  Variant of  uncertain significance in KIT at c.839C>T (p.Ala280Val). Report date is 11/09/2022.   The Multi-Cancer + RNA Panel offered by [facility] includes sequencing and/or deletion/duplication analysis of the following 70 genes:  AIP*, ALK, APC*, ATM*, AXIN2*, BAP1*, BARD1*, BLM*, BMPR1A*, BRCA1*, BRCA2*, BRIP1*, CDC73*, CDH1*, CDK4, CDKN1B*, CDKN2A, CHEK2*, CTNNA1*, DICER1*, EPCAM (del/dup only), EGFR, FH*, FLCN*, GREM1 (promoter dup only), HOXB13, KIT, LZTR1, MAX*, MBD4, MEN1*, MET, MITF, MLH1*, MSH2*, MSH3*, MSH6*, MUTYH*, NF1*, NF2*, NTHL1*, PALB2*, PDGFRA, PMS2*, POLD1*, POLE*, POT1*, PRKAR1A*, PTCH1*, PTEN*, RAD51C*, RAD51D*, RB1*, RET, SDHA* (sequencing only), SDHAF2*, SDHB*, SDHC*, SDHD*, SMAD4*, SMARCA4*, SMARCB1*, SMARCE1*, STK11*, SUFU*, TMEM127*, TP53*, TSC1*, TSC2*, VHL*. RNA analysis is performed for * genes.    Malignant neoplasm of upper-outer quadrant of left breast in female, estrogen receptor positive (HCC)  10/17/2022 Initial Diagnosis   Diffuse bilateral breast pains.  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 Report date is 11/09/2022.   The Multi-Cancer + RNA Panel offered by [facility] includes sequencing and/or deletion/duplication analysis of the following 70 genes:  AIP*, ALK, APC*, ATM*, AXIN2*, BAP1*, BARD1*,  BLM*, BMPR1A*, BRCA1*, BRCA2*, BRIP1*, CDC73*, CDH1*, CDK4, CDKN1B*, CDKN2A, CHEK2*, CTNNA1*, DICER1*, EPCAM (del/dup only), EGFR, FH*, FLCN*, GREM1 (promoter dup only), HOXB13, KIT, LZTR1, MAX*, MBD4, MEN1*, MET, MITF, MLH1*, MSH2*, MSH3*, MSH6*, MUTYH*, NF1*, NF2*, NTHL1*, PALB2*, PDGFRA, PMS2*, POLD1*, POLE*, POT1*, PRKAR1A*, PTCH1*, PTEN*, RAD51C*, RAD51D*, RB1*, RET, SDHA* (sequencing only), SDHAF2*, SDHB*, SDHC*, SDHD*, SMAD4*, SMARCA4*, SMARCB1*, SMARCE1*, STK11*, SUFU*, TMEM127*, TP53*, TSC1*, TSC2*, VHL*. RNA analysis is performed for * genes.    11/14/2022 Surgery   Left lumpectomy: Grade 2 ILC with LCIS 3.5 cm, 1/8 lymph node with micrometastases ER 100%, PR 40%, Ki-67 20%, HER2 3+ positive; lymph node prognostic panel: ER 100% PR 20% Ki-67 10%, HER2 2+ by IHC, FISH negative ratio 1.14   Malignant neoplasm of lower-outer quadrant of left breast of female, estrogen receptor positive (HCC)  10/30/2022 Initial Diagnosis   Malignant neoplasm of lower-outer quadrant of left breast of female, estrogen receptor positive (HCC)   12/27/2022 -  Chemotherapy   Patient is on Treatment Plan : BREAST Docetaxel + Carboplatin + Trastuzumab (TCH) q21d / Trastuzumab q21d       CHIEF COMPLIANT:   INTERVAL HISTORY: Tonya Vincent is a   ALLERGIES:  is allergic to advanced hand sanitizer [alcohol], norgesic [orphenadrine-aspirin-caffeine], hydrocodone, oxycodone, and antibacterial hand soap [triclosan].  MEDICATIONS:  Current Outpatient Medications  Medication Sig Dispense Refill   amLODipine (NORVASC) 10 MG tablet Take 10 mg by mouth at bedtime.      Aromatic Inhalants (VICKS VAPOINHALER IN) Inhale 1 puff into the lungs daily as needed (congestion).     atorvastatin (LIPITOR) 10 MG tablet Take 10 mg by mouth daily.     empagliflozin (JARDIANCE)  25 MG TABS tablet Take 25 mg by mouth daily with supper.      ibuprofen (ADVIL) 800 MG tablet Take 800 mg by mouth every 8 (eight) hours as needed for  moderate pain.     latanoprost (XALATAN) 0.005 % ophthalmic solution Place 1 drop into both eyes at bedtime.      lidocaine-prilocaine (EMLA) cream Apply to affected area once 30 g 3   loperamide (IMODIUM A-D) 2 MG tablet Take 2 mg by mouth 4 (four) times daily as needed for diarrhea or loose stools.     loratadine (CLARITIN REDITABS) 10 MG dissolvable tablet Take 10 mg by mouth daily.     olmesartan (BENICAR) 40 MG tablet Take 40 mg by mouth daily.     ondansetron (ZOFRAN) 8 MG tablet Take 1 tablet (8 mg total) by mouth every 8 (eight) hours as needed for nausea or vomiting. Start on the third day after chemotherapy. (Patient not taking: Reported on 04/11/2023) 30 tablet 1   oxymetazoline (AFRIN) 0.05 % nasal spray Place 1 spray into both nostrils 2 (two) times daily.     prochlorperazine (COMPAZINE) 10 MG tablet Take 1 tablet (10 mg total) by mouth every 6 (six) hours as needed for nausea or vomiting. (Patient not taking: Reported on 04/11/2023) 30 tablet 1   rivaroxaban (XARELTO) 20 MG TABS tablet Take 1 tablet (20 mg total) by mouth daily with supper. 90 tablet 3   Semaglutide, 1 MG/DOSE, 2 MG/1.5ML SOPN Inject 1 mg into the skin every Sunday.     spironolactone (ALDACTONE) 25 MG tablet Take 50 mg by mouth daily.     vortioxetine HBr (TRINTELLIX) 10 MG TABS tablet Take 10 mg by mouth daily with supper.     No current facility-administered medications for this visit.    PHYSICAL EXAMINATION: ECOG PERFORMANCE STATUS: {CHL ONC ECOG [phone]}  There were no vitals filed for this visit. There were no vitals filed for this visit.  BREAST:*** No palpable masses or nodules in either right or left breasts. No palpable axillary supraclavicular or infraclavicular adenopathy no breast tenderness or nipple discharge. (exam performed in the presence of a chaperone)  LABORATORY DATA:  I have reviewed the data as listed    Latest Ref Rng & Units 04/11/2023    7:45 AM 03/21/2023    9:53 AM 02/28/2023     8:58 AM  CMP  Glucose 70 - 99 mg/dL 409  89  811   BUN 8 - 23 mg/dL 14  13  14    Creatinine 0.44 - 1.00 mg/dL 9.14  7.82  9.56   Sodium 135 - 145 mmol/L 137  139  139   Potassium 3.5 - 5.1 mmol/L 4.1  4.0  3.9   Chloride 98 - 111 mmol/L 104  106  105   CO2 22 - 32 mmol/L 23  25  25    Calcium 8.9 - 10.3 mg/dL 9.3  9.6  9.4   Total Protein 6.5 - 8.1 g/dL 7.7  7.6  7.6   Total Bilirubin 0.3 - 1.2 mg/dL 0.4  0.3  0.4   Alkaline Phos 38 - 126 U/L 102  89  95   AST 15 - 41 U/L 13  16  12    ALT 0 - 44 U/L 13  15  12      Lab Results  Component Value Date   WBC 2.4 (L) 04/11/2023   HGB 10.7 (L) 04/11/2023   HCT 33.0 (L) 04/11/2023   MCV 87.1  04/11/2023   PLT 91 (L) 04/11/2023   NEUTROABS 1.4 (L) 04/11/2023    ASSESSMENT & PLAN:  No problem-specific Assessment & Plan notes found for this encounter.    No orders of the defined types were placed in this encounter.  The patient has a good understanding of the overall plan. Tonya Vincent agrees with it. Tonya Vincent will call with any problems that may develop before the next visit here. Total time spent: 30 mins including face to face time and time spent for planning, charting and co-ordination of care   Sherlyn Lick, CMA 04/26/23    I Janan Ridge am acting as a Neurosurgeon for [employer]  ***

## 2023-05-01 MED FILL — Fosaprepitant Dimeglumine For IV Infusion 150 MG (Base Eq): INTRAVENOUS | Qty: 5 | Status: AC

## 2023-05-02 ENCOUNTER — Inpatient Hospital Stay (HOSPITAL_BASED_OUTPATIENT_CLINIC_OR_DEPARTMENT_OTHER): Payer: Medicare HMO | Admitting: Hematology and Oncology

## 2023-05-02 ENCOUNTER — Other Ambulatory Visit: Payer: Self-pay

## 2023-05-02 ENCOUNTER — Inpatient Hospital Stay: Payer: Medicare HMO

## 2023-05-02 ENCOUNTER — Encounter: Payer: Self-pay | Admitting: *Deleted

## 2023-05-02 VITALS — BP 189/72 | HR 71 | Resp 16

## 2023-05-02 VITALS — BP 173/79 | HR 78 | Temp 97.8°F | Resp 18 | Ht 63.0 in | Wt 185.7 lb

## 2023-05-02 DIAGNOSIS — Z85048 Personal history of other malignant neoplasm of rectum, rectosigmoid junction, and anus: Secondary | ICD-10-CM | POA: Diagnosis not present

## 2023-05-02 DIAGNOSIS — C50412 Malignant neoplasm of upper-outer quadrant of left female breast: Secondary | ICD-10-CM

## 2023-05-02 DIAGNOSIS — Z170 Estrogen receptor positive status [ER+]: Secondary | ICD-10-CM | POA: Diagnosis not present

## 2023-05-02 DIAGNOSIS — C50512 Malignant neoplasm of lower-outer quadrant of left female breast: Secondary | ICD-10-CM

## 2023-05-02 DIAGNOSIS — I427 Cardiomyopathy due to drug and external agent: Secondary | ICD-10-CM | POA: Diagnosis not present

## 2023-05-02 DIAGNOSIS — Z95828 Presence of other vascular implants and grafts: Secondary | ICD-10-CM

## 2023-05-02 DIAGNOSIS — Z5111 Encounter for antineoplastic chemotherapy: Secondary | ICD-10-CM | POA: Diagnosis not present

## 2023-05-02 DIAGNOSIS — Z79899 Other long term (current) drug therapy: Secondary | ICD-10-CM | POA: Diagnosis not present

## 2023-05-02 LAB — CBC WITH DIFFERENTIAL (CANCER CENTER ONLY)
Abs Immature Granulocytes: 0 10*3/uL (ref 0.00–0.07)
Basophils Absolute: 0 10*3/uL (ref 0.0–0.1)
Basophils Relative: 1 %
Eosinophils Absolute: 0 10*3/uL (ref 0.0–0.5)
Eosinophils Relative: 1 %
HCT: 33.9 % — ABNORMAL LOW (ref 36.0–46.0)
Hemoglobin: 11.1 g/dL — ABNORMAL LOW (ref 12.0–15.0)
Immature Granulocytes: 0 %
Lymphocytes Relative: 39 %
Lymphs Abs: 0.9 10*3/uL (ref 0.7–4.0)
MCH: 29.1 pg (ref 26.0–34.0)
MCHC: 32.7 g/dL (ref 30.0–36.0)
MCV: 89 fL (ref 80.0–100.0)
Monocytes Absolute: 0.4 10*3/uL (ref 0.1–1.0)
Monocytes Relative: 19 %
Neutro Abs: 1 10*3/uL — ABNORMAL LOW (ref 1.7–7.7)
Neutrophils Relative %: 40 %
Platelet Count: 158 10*3/uL (ref 150–400)
RBC: 3.81 MIL/uL — ABNORMAL LOW (ref 3.87–5.11)
RDW: 19 % — ABNORMAL HIGH (ref 11.5–15.5)
WBC Count: 2.4 10*3/uL — ABNORMAL LOW (ref 4.0–10.5)
nRBC: 0 % (ref 0.0–0.2)

## 2023-05-02 LAB — CMP (CANCER CENTER ONLY)
ALT: 14 U/L (ref 0–44)
AST: 13 U/L — ABNORMAL LOW (ref 15–41)
Albumin: 4.2 g/dL (ref 3.5–5.0)
Alkaline Phosphatase: 98 U/L (ref 38–126)
Anion gap: 6 (ref 5–15)
BUN: 11 mg/dL (ref 8–23)
CO2: 26 mmol/L (ref 22–32)
Calcium: 9.2 mg/dL (ref 8.9–10.3)
Chloride: 107 mmol/L (ref 98–111)
Creatinine: 0.92 mg/dL (ref 0.44–1.00)
GFR, Estimated: >60 mL/min (ref >60)
Glucose, Bld: 100 mg/dL — ABNORMAL HIGH (ref 70–99)
Potassium: 4 mmol/L (ref 3.5–5.1)
Sodium: 139 mmol/L (ref 135–145)
Total Bilirubin: 0.4 mg/dL (ref 0.3–1.2)
Total Protein: 7.4 g/dL (ref 6.5–8.1)

## 2023-05-02 MED ORDER — DIPHENHYDRAMINE HCL 25 MG PO CAPS
50.0000 mg | ORAL_CAPSULE | Freq: Once | ORAL | Status: AC
  Administered 2023-05-02: 50 mg via ORAL
  Filled 2023-05-02: qty 2

## 2023-05-02 MED ORDER — SODIUM CHLORIDE 0.9 % IV SOLN
150.0000 mg | Freq: Once | INTRAVENOUS | Status: AC
  Administered 2023-05-02: 150 mg via INTRAVENOUS
  Filled 2023-05-02: qty 150

## 2023-05-02 MED ORDER — ACETAMINOPHEN 325 MG PO TABS
650.0000 mg | ORAL_TABLET | Freq: Once | ORAL | Status: AC
  Administered 2023-05-02: 650 mg via ORAL
  Filled 2023-05-02: qty 2

## 2023-05-02 MED ORDER — PALONOSETRON HCL INJECTION 0.25 MG/5ML
0.2500 mg | Freq: Once | INTRAVENOUS | Status: AC
  Administered 2023-05-02: 0.25 mg via INTRAVENOUS
  Filled 2023-05-02: qty 5

## 2023-05-02 MED ORDER — DEXAMETHASONE SODIUM PHOSPHATE 10 MG/ML IJ SOLN
4.0000 mg | Freq: Once | INTRAMUSCULAR | Status: AC
  Administered 2023-05-02: 4 mg via INTRAVENOUS
  Filled 2023-05-02: qty 1

## 2023-05-02 MED ORDER — TRASTUZUMAB-ANNS CHEMO 150 MG IV SOLR
6.0000 mg/kg | Freq: Once | INTRAVENOUS | Status: AC
  Administered 2023-05-02: 525 mg via INTRAVENOUS
  Filled 2023-05-02: qty 25

## 2023-05-02 MED ORDER — SODIUM CHLORIDE 0.9% FLUSH
10.0000 mL | INTRAVENOUS | Status: DC | PRN
  Administered 2023-05-02: 10 mL via INTRAVENOUS

## 2023-05-02 MED ORDER — SODIUM CHLORIDE 0.9 % IV SOLN: Freq: Once | INTRAVENOUS | Status: AC

## 2023-05-02 MED ORDER — HEPARIN SOD (PORK) LOCK FLUSH 100 UNIT/ML IV SOLN
500.0000 [IU] | Freq: Once | INTRAVENOUS | Status: AC | PRN
  Administered 2023-05-02: 500 [IU]

## 2023-05-02 MED ORDER — SODIUM CHLORIDE 0.9 % IV SOLN
390.0000 mg | Freq: Once | INTRAVENOUS | Status: AC
  Administered 2023-05-02: 390 mg via INTRAVENOUS
  Filled 2023-05-02: qty 39

## 2023-05-02 MED ORDER — SODIUM CHLORIDE 0.9% FLUSH
10.0000 mL | INTRAVENOUS | Status: DC | PRN
  Administered 2023-05-02: 10 mL

## 2023-05-02 NOTE — Progress Notes (Signed)
Per Dr. Pamelia Hoit- ok to treat today with ANC of 1.0.

## 2023-05-02 NOTE — Assessment & Plan Note (Signed)
10/17/2022 Diffuse bilateral breast pains.  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 Thrombocytopenia: Platelets 75: Okay to treat for today with a reduced carboplatin dose of AUC 4 ANC 1.5: Okay to treat   Right neck swelling and pain with redness of the right forearm: 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   Return to clinic in 3 weeks for Herceptin maintenance. Will refer the patient for adjuvant radiation.

## 2023-05-02 NOTE — Patient Instructions (Signed)
[facility]  Discharge Instructions: Thank you for choosing [facility] to provide your oncology and hematology care.   If you have a lab appointment with the Cancer Center, please go directly to the Cancer Center and check in at the registration area.   Wear comfortable clothing and clothing appropriate for easy access to any Portacath or PICC line.   We strive to give you quality time with your provider. You may need to reschedule your appointment if you arrive late (15 or more minutes).  Arriving late affects you and other patients whose appointments are after yours.  Also, if you miss three or more appointments without notifying the office, you may be dismissed from the clinic at the provider's discretion.      For prescription refill requests, have your pharmacy contact our office and allow 72 hours for refills to be completed.    Today you received the following chemotherapy and/or immunotherapy agents: Trastuzumab, Carboplatin      To help prevent nausea and vomiting after your treatment, we encourage you to take your nausea medication as directed.  BELOW ARE SYMPTOMS THAT SHOULD BE REPORTED IMMEDIATELY: *FEVER GREATER THAN 100.4 F (38 C) OR HIGHER *CHILLS OR SWEATING *NAUSEA AND VOMITING THAT IS NOT CONTROLLED WITH YOUR NAUSEA MEDICATION *UNUSUAL SHORTNESS OF BREATH *UNUSUAL BRUISING OR BLEEDING *URINARY PROBLEMS (pain or burning when urinating, or frequent urination) *BOWEL PROBLEMS (unusual diarrhea, constipation, pain near the anus) TENDERNESS IN MOUTH AND THROAT WITH OR WITHOUT PRESENCE OF ULCERS (sore throat, sores in mouth, or a toothache) UNUSUAL RASH, SWELLING OR PAIN  UNUSUAL VAGINAL DISCHARGE OR ITCHING   Items with * indicate a potential emergency and should be followed up as soon as possible or go to the Emergency Department if any problems should occur.  Please show the CHEMOTHERAPY ALERT CARD or IMMUNOTHERAPY  ALERT CARD at check-in to the Emergency Department and triage nurse.  Should you have questions after your visit or need to cancel or reschedule your appointment, please contact [facility]  Dept: [phone]  and follow the prompts.  Office hours are 8:00 a.m. to 4:30 p.m. Monday - Friday. Please note that voicemails left after 4:00 p.m. may not be returned until the following business day.  We are closed weekends and major holidays. You have access to a nurse at all times for urgent questions. Please call the main number to the clinic Dept: [phone] and follow the prompts.   For any non-urgent questions, you may also contact your provider using MyChart. We now offer e-Visits for anyone 18 and older to request care online for non-urgent symptoms. For details visit [email or web address].   Also download the MyChart app! Go to the app store, search "MyChart", open the app, select [facility], and log in with your MyChart username and password.   

## 2023-05-03 ENCOUNTER — Other Ambulatory Visit: Payer: Self-pay

## 2023-05-04 ENCOUNTER — Ambulatory Visit: Payer: Medicare HMO

## 2023-05-06 ENCOUNTER — Telehealth: Payer: Self-pay | Admitting: Hematology and Oncology

## 2023-05-06 NOTE — Telephone Encounter (Signed)
Scheduled appointments per WQ. Patient is aware of the made appointments.  

## 2023-05-07 ENCOUNTER — Other Ambulatory Visit: Payer: Self-pay

## 2023-05-08 NOTE — Progress Notes (Signed)
Location of Breast Cancer: Malignant neoplasm of lower-outer quadrant of left breast of female, estrogen receptor positive   Histology per Pathology Report:  11-14-22 FINAL MICROSCOPIC DIAGNOSIS:  A. BREAST, LEFT, LUMPECTOMY: -  Invasive lobular carcinoma and associated LCIS (3.5 x 3.2 x 2.0 cm) -  Overall Nottingham histologic score II of III (tubular differentiation 3/3; nuclear pleomorphism 2/3; mitotic rate 1/3). -  Negative margins (closest margins superior and lateral 1 and 2 mm respectively) scratch that; all others greater than 10 mm). pT2 pN91mi; see below and synoptic report for further information.  B. LYMPH NODE, LEFT AXILLARY, SENTINEL, EXCISION: -  1 lymph node (1/1), positive for malignancy (1 mm in greatest dimension and just over 200 cells by pankeratin immunohistochemistry).  C. LYMPH NODE, LEFT AXILLARY, SENTINEL, EXCISION: -  1 lymph node, negative for malignancy (0/1), multiple levels and keratin immunohistochemistry examined.  D. LYMPH NODE, LEFT AXILLARY, SENTINEL, EXCISION: -  1 lymph node, negative for malignancy (0/1), multiple levels and keratin immunohistochemistry examined.  E. LYMPH NODE, LEFT AXILLARY, SENTINEL, EXCISION: -  1 lymph node, negative for malignancy (0/1), multiple levels and keratin immunohistochemistry examined.  F. LYMPH NODE, LEFT AXILLARY, SENTINEL, EXCISION: -  1 lymph node, negative for malignancy (0/1), multiple levels and keratin immunohistochemistry examined.  G. LYMPH NODE, LEFT AXILLARY, SENTINEL, EXCISION: -  1 lymph node, negative for malignancy (0/1), multiple levels and keratin immunohistochemistry examined.  H. LYMPH NODE, LEFT AXILLARY, SENTINEL, EXCISION: -  1 lymph node, negative for malignancy (0/1), multiple levels and keratin immunohistochemistry examined.  I. LYMPH NODE, LEFT AXILLARY, SENTINEL, EXCISION: -  1 lymph node, negative for malignancy (0/1), multiple levels and keratin immunohistochemistry  examined.  ONCOLOGY TABLE:  INVASIVE CARCINOMA OF THE BREAST:  Resection  Procedure: Lumpectomy Specimen Laterality: Left Histologic Type: Invasive lobular carcinoma (loss of E-cadherin on diagnostic biopsy specimen). Histologic Grade:      Glandular (Acinar)/Tubular Differentiation: 3/3      Nuclear Pleomorphism: 2/3      Mitotic Rate: 1/3      Overall Grade: II/III Tumor Size: 3.5 x 3.2 x 2.0 cm Lobular carcinoma in situ; extensively present Tumor Extent: Limited to breast parenchyma Treatment Effect in the Breast: No known presurgical therapy Margins: All margins negative for invasive carcinoma      Distance from Closest Margin (mm): 1 mm (superior) and 2 mm (lateral).  All others greater than 10 mm      Specify Closest Margin (required only if <65mm): See above Superior and lateral LCIS Margins: Uninvolved by LCIS Regional Lymph Nodes:      Number of Lymph Nodes Examined: 8      Number of Sentinel Nodes Examined: 8      Number of Lymph Nodes with Macrometastases (>2 mm): 0      Number of Lymph Nodes with Micrometastases: 1 (just above the 200 cell count off)      Number of Lymph Nodes with Isolated Tumor Cells (=0.2 mm or =200 cells): 0      Size of Largest Metastatic Deposit (mm): 1 mm      Extranodal Extension: Not present Distant Metastasis:      Distant Site(s) Involved: N/A Breast Biomarker Testing Performed on Previous Biopsy:      Testing Performed on Case Number: [id number] parts 1 and 2            Estrogen Receptor: 95% strong positive (parts 1 and 2)            Progesterone  Receptor: 5% positive strong (part 1); 75% positive strong (part 2)            HER2: Positive 3+ (part 1); FISH negative/IHC 2+ (part 2)            Ki-67: 40% (part 1); 15% (part 2) Pathologic Stage Classification (pTNM, AJCC 8th Edition): pT2, pN22mi Representative Tumor Block: A5 Comment(s): [None]  (v4.5.0.0)   Receptor Status: PR 40%, Ki-67 20%, HER2 3+ positive; lymph node  prognostic panel: ER 100% PR 20% Ki-67 10%, HER2 2+ by IHC, FISH negative ratio 1.14  Did patient present with symptoms (if so, please note symptoms) or was this found on screening mammography?: screening mammogram  Past/Anticipated interventions by surgeon, if any: 11-14-22 Procedure(s): LEFT BREAST RADIOACTIVE SEED GUIDED LUMPECTOMY x2 DEEP LEFT AXILLARY SENTINEL LYMPH NODE BIOPSY INJECTION OF MAG TRACE FOR LYMPH NODE MAPPING   Surgeon(s): Abigail Miyamoto, MD  Past/Anticipated interventions by medical oncology, if any:  Dr. Pamelia Hoit on 05-02-23 Malignant neoplasm of upper-outer quadrant of left breast in female, estrogen receptor positive (HCC)  10/17/2022 Initial Diagnosis    Diffuse bilateral breast pains.  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%, HER2 3+ positive, Ki-67 40%, second mass biopsy revealed grade 2 ILC ER 95%, PR 70%, HER2 negative, Ki-67 15%    10/31/2022 Cancer Staging    Staging form: Breast, AJCC 8th Edition - Clinical: Stage IA (cT1a, cN0, cM0, G2, ER+, PR+, HER2+) - Signed by [name], MD on 10/31/2022 Stage prefix: Initial diagnosis Histologic grading system: 3 grade system    11/09/2022 Genetic Testing    Negative genetics for [facility] Multi-Cancer +RNA Panel.  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 Report date is 11/09/2022.    The Multi-Cancer + RNA Panel offered by [facility] includes sequencing and/or deletion/duplication analysis of the following 70 genes:  AIP*, ALK, APC*, ATM*, AXIN2*, BAP1*, BARD1*, BLM*, BMPR1A*, BRCA1*, BRCA2*, BRIP1*, CDC73*, CDH1*, CDK4, CDKN1B*, CDKN2A, CHEK2*, CTNNA1*, DICER1*, EPCAM (del/dup only), EGFR, FH*, FLCN*, GREM1 (promoter dup only), HOXB13, KIT, LZTR1, MAX*, MBD4, MEN1*, MET, MITF, MLH1*, MSH2*, MSH3*, MSH6*, MUTYH*,  NF1*, NF2*, NTHL1*, PALB2*, PDGFRA, PMS2*, POLD1*, POLE*, POT1*, PRKAR1A*, PTCH1*, PTEN*, RAD51C*, RAD51D*, RB1*, RET, SDHA* (sequencing only), SDHAF2*, SDHB*, SDHC*, SDHD*, SMAD4*, SMARCA4*, SMARCB1*, SMARCE1*, STK11*, SUFU*, TMEM127*, TP53*, TSC1*, TSC2*, VHL*. RNA analysis is performed for * genes.      11/14/2022 Surgery    Left lumpectomy: Grade 2 ILC with LCIS 3.5 cm, 1/8 lymph node with micrometastases ER 100%, PR 40%, Ki-67 20%, HER2 3+ positive; lymph node prognostic panel: ER 100% PR 20% Ki-67 10%, HER2 2+ by IHC, FISH negative ratio 1.14    Malignant neoplasm of lower-outer quadrant of left breast of female, estrogen receptor positive (HCC)  10/30/2022 Initial Diagnosis    Malignant neoplasm of lower-outer quadrant of left breast of female, estrogen receptor positive (HCC)    12/27/2022 -  Chemotherapy    Patient is on Treatment Plan : BREAST Docetaxel + Carboplatin + Trastuzumab (TCH) q21d / Trastuzumab q21d      ASSESSMENT & PLAN:  Malignant neoplasm of upper-outer quadrant of left breast in female, estrogen receptor positive (HCC) 10/17/2022 Diffuse bilateral breast pains.  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  In spite of this she continues to have peripheral neuropathy in her hands feet as well as under the arms.  I discussed with her that it should slowly improve over time.  We even discussed discontinuing carboplatin but decided to keep it on for this last treatment. Thrombocytopenia: Platelets 156 ANC 1: Okay to treat   Right neck swelling and pain with redness of the right forearm: 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   Return to clinic in 3 weeks for Herceptin maintenance. Will refer the patient for adjuvant radiation. I will see her with every other Herceptin infusion with labs.  Lymphedema issues, if any:  no    Pain issues, if any:  no     SAFETY ISSUES: Prior radiation? Yes, rectal cancer in 2011.  Pacemaker/ICD? no Possible current pregnancy?no Is the patient on methotrexate? no  Current Complaints / other details:  Port placed on 12-17-22.      BP (!) 163/89 (BP Location: Right Arm, Patient Position: Sitting, Cuff Size: Large)   Pulse 82   Temp 98.2 F (36.8 C)   Resp 20   Ht 5\' 3"  (1.6 m)   Wt 187 lb 9.6 oz (85.1 kg)   SpO2 100%   BMI 33.23 kg/m

## 2023-05-13 ENCOUNTER — Encounter: Payer: Self-pay | Admitting: *Deleted

## 2023-05-21 NOTE — Progress Notes (Signed)
Radiation Oncology         [phone] ________________________________  Name: Tonya Vincent MRN: [id number]  Date: 05/22/2023  DOB: May 26, 1951  Re-Evaluation Note  CC: Renaye Rakers, MD  [name], MD  No diagnosis found.  Diagnosis: S/p lumpectomies x 2 and SLN biopsies : Left Breast LOQ and UOQ, Invasive and in situ lobular carcinoma, ER+ / PR+ / Her2+, Grade 2: with clean margins and 1 positive node (prognostics performed on the positive SLN showed ER/PR+, Her2-)  Stage (cT1c, Nx ) Left Breast UOQ, Invasive and in-situ Lobular Carcinoma of 2 different sites:  -- 3 o'clock left breast: ER+ / PR+ / Her2+, Grade 2 -- 2 o'clock left breast: ER+ / PR+ / Her2-, Grade 2  Narrative:  The patient returns today to discuss radiation treatment options. She was seen in the multidisciplinary breast clinic on 10/31/2022.   Since her consultation date, the patient presented for a CT CAP on 11/08/23 which demonstrated no evidence of metastatic disease in the chest, abdomen, or pelvis. A stable 3.5 x 1.9 cm right paraspinal soft tissue lesion was also appreciated, which is consistent with a benign etiology given its long-term stability dating back to 2013.   She also presented for a bilateral breast MRI on 11/09/22 which demonstrated the biopsy proven invasive lobular carcinoma in the upper outer left breast measuring 2.6 cm. MRI otherwise showed no evidence of multifocal/multicentric disease in the left breast, or evidence of disease in the right breast.    She underwent genetic testing on her consultation date. Results showed no clinically significant variants detected by [facility] genetic testing. 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  She opted to proceed with left breast lumpectomies x 2 with left axillary SLN biopsies on 11/14/22 under the care of Dr. Magnus Ivan. Pathology from the procedure revealed: tumor the size of 3.5 x 3.2 x 2.0 cm; histology of  grade 2 invasive lobular carcinoma with associated LCIS; all margins negative for invasive and in situ carcinoma; margin status to invasive disease of 1 mm from the superior margin; nodal status of 1/8 left axillary sentinel lymph node excisions positive for carcinoma. Prognostic indicators significant for: estrogen receptor 100% positive with moderate to strong staining intensity; progesterone receptor 40% positive with strong staining intensity; Proliferation marker Ki67 at 20%; Her2 status positive; Grade 2.   -- The two sites in the 2 and 3 o'clock left breast were excised as a whole (wide lumpectomy which incorporated both radioactive seeds and all the tissue in between the 2 specimens)  A prognostic panel was also performed on the positive lymph node: estrogen receptor 100% positive and progesterone receptor 20% positive, both with strong staining intensity; Proliferation marker Ki67 at 10%; Her2 status negative.  She has been treated with adjuvant chemotherapy consisting of TCH from 12/27/22 through 05/02/23 x 6 cycles under the care of Dr. Pamelia Hoit. Chemo toxicities encountered by the patient included: diarrhea (well controlled with imodium), abdominal discomfort, decreased appetite, mild nausea, peripheral neuropathy which prompted Taxotere to be discontinued with cycle 4, and thrombocytopenia. Although Taxotere was discontinued, she continued to have peripheral neuropathy in her hands feet as well as under her arms. Dr. Pamelia Hoit also discussed discontinuing carboplatin but the patient opted to keep it on for her 6th and final cycle. She also had issues with right neck swelling, pain, and redness of the right forearm. She was ultimately found to have DVT involving the right jugular right subclavian vein and  superficial vein on the arm (demonstrated on UE vascular ultrasound performed  on 01/29/23). She is now on Xarelto.  She will return to Dr. Pamelia Hoit this week to initiate maintenance treatment  consisting of herceptin.   Of note: The patient presented to the ED on 01/30/23 with c/o epistaxis from the left nostril  with large clots which began that evening. She had not yet started xarelto for DVT at that time. ED course consisted of nasal Afrin and a pledget soaked in lidocaine with epi with resolution of her bleeding.   On review of systems, the patient reports ***. She denies *** and any other symptoms.    Allergies:  is allergic to advanced hand sanitizer [alcohol], norgesic [orphenadrine-aspirin-caffeine], hydrocodone, oxycodone, and antibacterial hand soap [triclosan].  Meds: Current Outpatient Medications  Medication Sig Dispense Refill   amLODipine (NORVASC) 10 MG tablet Take 10 mg by mouth at bedtime.      Aromatic Inhalants (VICKS VAPOINHALER IN) Inhale 1 puff into the lungs daily as needed (congestion).     atorvastatin (LIPITOR) 10 MG tablet Take 10 mg by mouth daily.     empagliflozin (JARDIANCE) 25 MG TABS tablet Take 25 mg by mouth daily with supper.      ibuprofen (ADVIL) 800 MG tablet Take 800 mg by mouth every 8 (eight) hours as needed for moderate pain.     latanoprost (XALATAN) 0.005 % ophthalmic solution Place 1 drop into both eyes at bedtime.      lidocaine-prilocaine (EMLA) cream Apply to affected area once 30 g 3   loperamide (IMODIUM A-D) 2 MG tablet Take 2 mg by mouth 4 (four) times daily as needed for diarrhea or loose stools.     loratadine (CLARITIN REDITABS) 10 MG dissolvable tablet Take 10 mg by mouth daily.     olmesartan (BENICAR) 40 MG tablet Take 40 mg by mouth daily.     ondansetron (ZOFRAN) 8 MG tablet Take 1 tablet (8 mg total) by mouth every 8 (eight) hours as needed for nausea or vomiting. Start on the third day after chemotherapy. 30 tablet 1   oxymetazoline (AFRIN) 0.05 % nasal spray Place 1 spray into both nostrils 2 (two) times daily.     prochlorperazine (COMPAZINE) 10 MG tablet Take 1 tablet (10 mg total) by mouth every 6 (six) hours as  needed for nausea or vomiting. 30 tablet 1   rivaroxaban (XARELTO) 20 MG TABS tablet Take 1 tablet (20 mg total) by mouth daily with supper. 90 tablet 3   Semaglutide, 1 MG/DOSE, 2 MG/1.5ML SOPN Inject 1 mg into the skin every Sunday.     spironolactone (ALDACTONE) 25 MG tablet Take 50 mg by mouth daily.     vortioxetine HBr (TRINTELLIX) 10 MG TABS tablet Take 10 mg by mouth daily with supper.     No current facility-administered medications for this encounter.    Physical Findings: The patient is in no acute distress. Patient is alert and oriented.  vitals were not taken for this visit.  No significant changes. Lungs are clear to auscultation bilaterally. Heart has regular rate and rhythm. No palpable cervical, supraclavicular, or axillary adenopathy. Abdomen soft, non-tender, normal bowel sounds. Right Breast: no palpable mass, nipple discharge or bleeding. Left Breast: ***  Lab Findings: Lab Results  Component Value Date   WBC 2.4 (L) 05/02/2023   HGB 11.1 (L) 05/02/2023   HCT 33.9 (L) 05/02/2023   MCV 89.0 05/02/2023   PLT 158 05/02/2023    Radiographic Findings: No  results found.  Impression: S/p lumpectomies x 2 and SLN biopsies : Left Breast LOQ and UOQ, Invasive and in situ lobular carcinoma, ER+ / PR+ / Her2+, Grade 2: with clean margins and 1 positive node (prognostics performed on the positive SLN showed ER/PR+, Her2-)  ***  Plan:  Patient is scheduled for CT simulation {date/later today}. ***  -----------------------------------  Billie Lade, PhD, MD  This document serves as a record of services personally performed by Antony Blackbird, MD. It was created on his behalf by Neena Rhymes, a trained medical scribe. The creation of this record is based on the scribe's personal observations and the provider's statements to them. This document has been checked and approved by the attending provider.

## 2023-05-22 ENCOUNTER — Other Ambulatory Visit: Payer: Self-pay

## 2023-05-22 ENCOUNTER — Ambulatory Visit
Admission: RE | Admit: 2023-05-22 | Discharge: 2023-05-22 | Disposition: A | Discharge status: Home / Self Care | Payer: Medicare HMO | Source: Ambulatory Visit | Attending: Radiation Oncology | Admitting: Radiation Oncology

## 2023-05-22 ENCOUNTER — Encounter: Payer: Self-pay | Admitting: Radiation Oncology

## 2023-05-22 VITALS — BP 163/89 | HR 82 | Temp 98.2°F | Resp 20 | Ht 63.0 in | Wt 187.6 lb

## 2023-05-22 DIAGNOSIS — Z79899 Other long term (current) drug therapy: Secondary | ICD-10-CM | POA: Diagnosis not present

## 2023-05-22 DIAGNOSIS — C50412 Malignant neoplasm of upper-outer quadrant of left female breast: Secondary | ICD-10-CM | POA: Insufficient documentation

## 2023-05-22 DIAGNOSIS — Z170 Estrogen receptor positive status [ER+]: Secondary | ICD-10-CM | POA: Diagnosis not present

## 2023-05-22 DIAGNOSIS — C50512 Malignant neoplasm of lower-outer quadrant of left female breast: Secondary | ICD-10-CM | POA: Insufficient documentation

## 2023-05-22 DIAGNOSIS — Z7984 Long term (current) use of oral hypoglycemic drugs: Secondary | ICD-10-CM | POA: Insufficient documentation

## 2023-05-22 DIAGNOSIS — Z9221 Personal history of antineoplastic chemotherapy: Secondary | ICD-10-CM | POA: Diagnosis not present

## 2023-05-22 DIAGNOSIS — Z7901 Long term (current) use of anticoagulants: Secondary | ICD-10-CM | POA: Diagnosis not present

## 2023-05-22 DIAGNOSIS — Z923 Personal history of irradiation: Secondary | ICD-10-CM | POA: Diagnosis not present

## 2023-05-23 ENCOUNTER — Encounter: Payer: Self-pay | Admitting: *Deleted

## 2023-05-23 ENCOUNTER — Inpatient Hospital Stay: Payer: Medicare HMO | Attending: Hematology and Oncology

## 2023-05-23 ENCOUNTER — Ambulatory Visit
Admission: RE | Admit: 2023-05-23 | Discharge: 2023-05-23 | Disposition: A | Discharge status: Home / Self Care | Payer: Medicare HMO | Source: Ambulatory Visit | Attending: Radiation Oncology | Admitting: Radiation Oncology

## 2023-05-23 ENCOUNTER — Ambulatory Visit: Payer: Medicare HMO

## 2023-05-23 ENCOUNTER — Ambulatory Visit: Payer: Medicare HMO | Admitting: Hematology and Oncology

## 2023-05-23 ENCOUNTER — Other Ambulatory Visit: Payer: Self-pay

## 2023-05-23 VITALS — BP 181/81 | HR 71 | Temp 99.0°F | Resp 16

## 2023-05-23 DIAGNOSIS — Z170 Estrogen receptor positive status [ER+]: Secondary | ICD-10-CM | POA: Diagnosis not present

## 2023-05-23 DIAGNOSIS — Z5111 Encounter for antineoplastic chemotherapy: Secondary | ICD-10-CM | POA: Insufficient documentation

## 2023-05-23 DIAGNOSIS — Z79899 Other long term (current) drug therapy: Secondary | ICD-10-CM | POA: Insufficient documentation

## 2023-05-23 DIAGNOSIS — C50412 Malignant neoplasm of upper-outer quadrant of left female breast: Secondary | ICD-10-CM | POA: Insufficient documentation

## 2023-05-23 DIAGNOSIS — C50512 Malignant neoplasm of lower-outer quadrant of left female breast: Secondary | ICD-10-CM | POA: Insufficient documentation

## 2023-05-23 MED ORDER — ACETAMINOPHEN 325 MG PO TABS
650.0000 mg | ORAL_TABLET | Freq: Once | ORAL | Status: AC
  Administered 2023-05-23: 650 mg via ORAL
  Filled 2023-05-23: qty 2

## 2023-05-23 MED ORDER — SODIUM CHLORIDE 0.9 % IV SOLN: Freq: Once | INTRAVENOUS | Status: AC

## 2023-05-23 MED ORDER — TRASTUZUMAB-ANNS CHEMO 150 MG IV SOLR
6.0000 mg/kg | Freq: Once | INTRAVENOUS | Status: AC
  Administered 2023-05-23: 525 mg via INTRAVENOUS
  Filled 2023-05-23: qty 25

## 2023-05-23 MED ORDER — SODIUM CHLORIDE 0.9% FLUSH
10.0000 mL | INTRAVENOUS | Status: DC | PRN
  Administered 2023-05-23: 10 mL

## 2023-05-23 MED ORDER — DIPHENHYDRAMINE HCL 25 MG PO CAPS
50.0000 mg | ORAL_CAPSULE | Freq: Once | ORAL | Status: AC
  Administered 2023-05-23: 50 mg via ORAL
  Filled 2023-05-23: qty 2

## 2023-05-23 MED ORDER — HEPARIN SOD (PORK) LOCK FLUSH 100 UNIT/ML IV SOLN
500.0000 [IU] | Freq: Once | INTRAVENOUS | Status: AC | PRN
  Administered 2023-05-23: 500 [IU]

## 2023-05-23 NOTE — Patient Instructions (Signed)
[facility]  Discharge Instructions: Thank you for choosing [facility] to provide your oncology and hematology care.   If you have a lab appointment with the Cancer Center, please go directly to the Cancer Center and check in at the registration area.   Wear comfortable clothing and clothing appropriate for easy access to any Portacath or PICC line.   We strive to give you quality time with your provider. You may need to reschedule your appointment if you arrive late (15 or more minutes).  Arriving late affects you and other patients whose appointments are after yours.  Also, if you miss three or more appointments without notifying the office, you may be dismissed from the clinic at the provider's discretion.      For prescription refill requests, have your pharmacy contact our office and allow 72 hours for refills to be completed.    Today you received the following chemotherapy and/or immunotherapy agents: Kanjinti.      To help prevent nausea and vomiting after your treatment, we encourage you to take your nausea medication as directed.  BELOW ARE SYMPTOMS THAT SHOULD BE REPORTED IMMEDIATELY: *FEVER GREATER THAN 100.4 F (38 C) OR HIGHER *CHILLS OR SWEATING *NAUSEA AND VOMITING THAT IS NOT CONTROLLED WITH YOUR NAUSEA MEDICATION *UNUSUAL SHORTNESS OF BREATH *UNUSUAL BRUISING OR BLEEDING *URINARY PROBLEMS (pain or burning when urinating, or frequent urination) *BOWEL PROBLEMS (unusual diarrhea, constipation, pain near the anus) TENDERNESS IN MOUTH AND THROAT WITH OR WITHOUT PRESENCE OF ULCERS (sore throat, sores in mouth, or a toothache) UNUSUAL RASH, SWELLING OR PAIN  UNUSUAL VAGINAL DISCHARGE OR ITCHING   Items with * indicate a potential emergency and should be followed up as soon as possible or go to the Emergency Department if any problems should occur.  Please show the CHEMOTHERAPY ALERT CARD or IMMUNOTHERAPY ALERT CARD at  check-in to the Emergency Department and triage nurse.  Should you have questions after your visit or need to cancel or reschedule your appointment, please contact [facility]  Dept: [phone]  and follow the prompts.  Office hours are 8:00 a.m. to 4:30 p.m. Monday - Friday. Please note that voicemails left after 4:00 p.m. may not be returned until the following business day.  We are closed weekends and major holidays. You have access to a nurse at all times for urgent questions. Please call the main number to the clinic Dept: [phone] and follow the prompts.   For any non-urgent questions, you may also contact your provider using MyChart. We now offer e-Visits for anyone 18 and older to request care online for non-urgent symptoms. For details visit [email or web address].   Also download the MyChart app! Go to the app store, search "MyChart", open the app, select [facility], and log in with your MyChart username and password.   

## 2023-05-23 NOTE — Progress Notes (Signed)
Error

## 2023-05-24 ENCOUNTER — Other Ambulatory Visit: Payer: Self-pay

## 2023-05-27 ENCOUNTER — Ambulatory Visit: Payer: Medicare HMO | Attending: Surgery

## 2023-05-27 VITALS — Wt 186.0 lb

## 2023-05-27 DIAGNOSIS — Z483 Aftercare following surgery for neoplasm: Secondary | ICD-10-CM | POA: Insufficient documentation

## 2023-05-27 NOTE — Therapy (Signed)
OUTPATIENT PHYSICAL THERAPY SOZO SCREENING NOTE   Patient Name: Tonya Vincent MRN: [id number] DOB:[birth year], 72 y.o., female Today's Date: 05/27/2023  PCP: Renaye Rakers, MD REFERRING PROVIDER: Abigail Miyamoto, MD   PT End of Session - 05/27/23 0945     Visit Number 2   # unchanged due to screen only   PT Start Time 0943    PT Stop Time 0947    PT Time Calculation (min) 4 min    Activity Tolerance Patient tolerated treatment well    Behavior During Therapy [facility] for tasks assessed/performed             Past Medical History:  Diagnosis Date   ADHD (attention deficit hyperactivity disorder)    Anemia    Anxiety    Arthritis    Breast cancer (HCC) 10/2022   left breast ILC   Complication of anesthesia    difficulty waking up   Depression    Diabetes mellitus    type 2 DM   Dyspnea    with exertion   Heart murmur    History of rectal cancer 10/04/2011   Hypertension    Overactive bladder    Paraspinal mass 12/29/2014   Rectal cancer (HCC) dx'd 11/16/09   xrt comp 01/2010; chemo comp 01/2011   Tubular adenoma of colon 01/08/2014   Past Surgical History:  Procedure Laterality Date   ABDOMINAL HYSTERECTOMY     for uterine fibroids   BREAST LUMPECTOMY WITH RADIOACTIVE SEED LOCALIZATION Left 11/14/2022   Procedure: LEFT BREAST RADIOACTIVE SEED GUIDED LUMPECTOMY x2;  Surgeon: Abigail Miyamoto, MD;  Location: [facility];  Service: General;  Laterality: Left;   COLON SURGERY N/A 04/24/2010   "took out half rectum"; rectal cancer   EYE SURGERY     lasik surgery   HAND SURGERY     carpal tunnel- left   HEMORRHOID SURGERY N/A 1980   IR CV LINE INJECTION  03/22/2023   IR IMAGING GUIDED PORT INSERTION  04/09/2023   IR REMOVAL TUN ACCESS W/ PORT W/O FL MOD SED  04/09/2023   ORIF ANKLE FRACTURE Right 04/11/2020   Procedure: OPEN REDUCTION INTERNAL FIXATION (ORIF) ANKLE FRACTURE;  Surgeon: Vickki Hearing, MD;  Location: [facility];  Service: Orthopedics;   Laterality: Right;   PORT-A-CATH REMOVAL N/A 02/17/2019   Procedure: REMOVAL PORT-A-CATH;  Surgeon: Abigail Miyamoto, MD;  Location: [facility];  Service: General;  Laterality: N/A;   PORTACATH PLACEMENT     2011   PORTACATH PLACEMENT N/A 12/17/2022   Procedure: INSERTION PORT-A-CATH;  Surgeon: Abigail Miyamoto, MD;  Location: [facility] OR;  Service: General;  Laterality: N/A;   RECTAL SURGERY     2010   SENTINEL NODE BIOPSY Left 11/14/2022   Procedure: SENTINEL LYMPH NODE BIOPSY;  Surgeon: Abigail Miyamoto, MD;  Location: [facility];  Service: General;  Laterality: Left;   WISDOM TOOTH EXTRACTION     Patient Active Problem List   Diagnosis Date Noted   Deep venous thrombosis (HCC) 02/28/2023   Malignant neoplasm of rectum (HCC) 01/18/2023   Port-A-Cath in place 12/27/2022   Genetic testing 11/12/2022   Malignant neoplasm of lower-outer quadrant of left breast of female, estrogen receptor positive (HCC) 10/30/2022   Malignant neoplasm of upper-outer quadrant of left breast in female, estrogen receptor positive (HCC) 10/23/2022   S/P ORIF (open reduction internal fixation) fracture right ankle 04/11/20 04/21/2020   Closed trimalleolar fracture of right ankle    History of colonic polyps 12/18/2019  Fecal smearing 12/18/2019   Left arm pain 02/04/2019   Rectal bleeding 02/06/2018   Microcytosis 01/01/2017   Essential hypertension 01/02/2016   Change in stool caliber 12/31/2014   Paraspinal mass 12/29/2014   Anemia in chronic illness 12/29/2014   History of rectal cancer 10/04/2011    REFERRING DIAG: left breast cancer at risk for lymphedema  THERAPY DIAG:  Aftercare following surgery for neoplasm  PERTINENT HISTORY: Patient was diagnosed on 10/12/2022 with 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 It measures 4 mm and 8 mm and is located in the upper outer quadrant. The smaller mass is triple positive with a Ki67 of 40%. The larger mass is ER/PR positive and  HER2 negative with a Ki67 of 15% . Pt had left lumpectomy with SLNB on 11/14/2022. 1/8 LN's positive. She will be having chemotherapy with Herceptin maintenance, radiation and anti estrogens. There is a deep seroma at the lumpectomy site identified by surgeon with no further treatment required at present per MD. Pt is a part time pharmacist   PRECAUTIONS: left UE Lymphedema risk, None  SUBJECTIVE: Pt returns for her first 3 month L-Dex screen.   PAIN:  Are you having pain? No  SOZO SCREENING: Patient was assessed today using the SOZO machine to determine the lymphedema index score. This was compared to her baseline score. It was determined that she is within the recommended range when compared to her baseline and no further action is needed at this time. She will continue SOZO screenings. These are done every 3 months for 2 years post operatively followed by every 6 months for 2 years, and then annually.   L-DEX FLOWSHEETS - 05/27/23 0900       L-DEX LYMPHEDEMA SCREENING   Measurement Type Unilateral    L-DEX MEASUREMENT EXTREMITY Upper Extremity    POSITION  Standing    DOMINANT SIDE Right    At Risk Side Left    BASELINE SCORE (UNILATERAL) 2.5    L-DEX SCORE (UNILATERAL) -0.1    VALUE CHANGE (UNILAT) -2.6              Hermenia Bers, PTA 05/27/2023, 9:47 AM

## 2023-05-28 ENCOUNTER — Other Ambulatory Visit: Payer: Self-pay

## 2023-06-02 DIAGNOSIS — Z170 Estrogen receptor positive status [ER+]: Secondary | ICD-10-CM | POA: Diagnosis not present

## 2023-06-02 DIAGNOSIS — C50412 Malignant neoplasm of upper-outer quadrant of left female breast: Secondary | ICD-10-CM | POA: Diagnosis not present

## 2023-06-02 DIAGNOSIS — C50512 Malignant neoplasm of lower-outer quadrant of left female breast: Secondary | ICD-10-CM | POA: Diagnosis not present

## 2023-06-03 ENCOUNTER — Encounter: Payer: Self-pay | Admitting: *Deleted

## 2023-06-04 ENCOUNTER — Other Ambulatory Visit: Payer: Self-pay

## 2023-06-04 ENCOUNTER — Ambulatory Visit
Admission: RE | Admit: 2023-06-04 | Discharge: 2023-06-04 | Disposition: A | Discharge status: Home / Self Care | Payer: Medicare HMO | Source: Ambulatory Visit | Attending: Radiation Oncology | Admitting: Radiation Oncology

## 2023-06-04 DIAGNOSIS — Z87891 Personal history of nicotine dependence: Secondary | ICD-10-CM | POA: Insufficient documentation

## 2023-06-04 DIAGNOSIS — C50512 Malignant neoplasm of lower-outer quadrant of left female breast: Secondary | ICD-10-CM

## 2023-06-04 DIAGNOSIS — Z170 Estrogen receptor positive status [ER+]: Secondary | ICD-10-CM | POA: Insufficient documentation

## 2023-06-04 DIAGNOSIS — R9430 Abnormal result of cardiovascular function study, unspecified: Secondary | ICD-10-CM | POA: Insufficient documentation

## 2023-06-04 DIAGNOSIS — Z86718 Personal history of other venous thrombosis and embolism: Secondary | ICD-10-CM | POA: Diagnosis not present

## 2023-06-04 DIAGNOSIS — Z510 Encounter for antineoplastic radiation therapy: Secondary | ICD-10-CM | POA: Diagnosis not present

## 2023-06-04 DIAGNOSIS — E1169 Type 2 diabetes mellitus with other specified complication: Secondary | ICD-10-CM | POA: Diagnosis not present

## 2023-06-04 DIAGNOSIS — Z9221 Personal history of antineoplastic chemotherapy: Secondary | ICD-10-CM | POA: Insufficient documentation

## 2023-06-04 DIAGNOSIS — F423 Hoarding disorder: Secondary | ICD-10-CM | POA: Diagnosis not present

## 2023-06-04 DIAGNOSIS — I10 Essential (primary) hypertension: Secondary | ICD-10-CM | POA: Diagnosis not present

## 2023-06-04 DIAGNOSIS — C50912 Malignant neoplasm of unspecified site of left female breast: Secondary | ICD-10-CM | POA: Diagnosis not present

## 2023-06-04 DIAGNOSIS — E7800 Pure hypercholesterolemia, unspecified: Secondary | ICD-10-CM | POA: Diagnosis not present

## 2023-06-04 DIAGNOSIS — C50412 Malignant neoplasm of upper-outer quadrant of left female breast: Secondary | ICD-10-CM | POA: Diagnosis not present

## 2023-06-04 DIAGNOSIS — F331 Major depressive disorder, recurrent, moderate: Secondary | ICD-10-CM | POA: Diagnosis not present

## 2023-06-04 LAB — RAD ONC ARIA SESSION SUMMARY

## 2023-06-04 MED ORDER — ALRA NON-METALLIC DEODORANT (RAD-ONC)
1.0000 | Freq: Once | TOPICAL | Status: AC
  Administered 2023-06-04: 1 via TOPICAL

## 2023-06-04 MED ORDER — RADIAPLEXRX EX GEL: Freq: Once | CUTANEOUS | Status: DC

## 2023-06-04 MED ORDER — RADIAPLEXRX EX GEL: Freq: Once | CUTANEOUS | Status: AC

## 2023-06-05 ENCOUNTER — Other Ambulatory Visit: Payer: Self-pay

## 2023-06-05 ENCOUNTER — Ambulatory Visit
Admission: RE | Admit: 2023-06-05 | Discharge: 2023-06-05 | Disposition: A | Discharge status: Home / Self Care | Payer: Medicare HMO | Source: Ambulatory Visit | Attending: Radiation Oncology | Admitting: Radiation Oncology

## 2023-06-05 DIAGNOSIS — Z9221 Personal history of antineoplastic chemotherapy: Secondary | ICD-10-CM | POA: Diagnosis not present

## 2023-06-05 DIAGNOSIS — Z86718 Personal history of other venous thrombosis and embolism: Secondary | ICD-10-CM | POA: Diagnosis not present

## 2023-06-05 DIAGNOSIS — Z170 Estrogen receptor positive status [ER+]: Secondary | ICD-10-CM | POA: Diagnosis not present

## 2023-06-05 DIAGNOSIS — C50512 Malignant neoplasm of lower-outer quadrant of left female breast: Secondary | ICD-10-CM | POA: Diagnosis not present

## 2023-06-05 DIAGNOSIS — Z87891 Personal history of nicotine dependence: Secondary | ICD-10-CM | POA: Diagnosis not present

## 2023-06-05 DIAGNOSIS — C50412 Malignant neoplasm of upper-outer quadrant of left female breast: Secondary | ICD-10-CM | POA: Diagnosis not present

## 2023-06-05 DIAGNOSIS — Z510 Encounter for antineoplastic radiation therapy: Secondary | ICD-10-CM | POA: Diagnosis not present

## 2023-06-05 DIAGNOSIS — I10 Essential (primary) hypertension: Secondary | ICD-10-CM | POA: Diagnosis not present

## 2023-06-05 DIAGNOSIS — R9430 Abnormal result of cardiovascular function study, unspecified: Secondary | ICD-10-CM | POA: Diagnosis not present

## 2023-06-05 LAB — RAD ONC ARIA SESSION SUMMARY

## 2023-06-07 ENCOUNTER — Other Ambulatory Visit: Payer: Self-pay

## 2023-06-07 ENCOUNTER — Ambulatory Visit
Admission: RE | Admit: 2023-06-07 | Discharge: 2023-06-07 | Disposition: A | Discharge status: Home / Self Care | Payer: Medicare HMO | Source: Ambulatory Visit | Attending: Radiation Oncology | Admitting: Radiation Oncology

## 2023-06-07 DIAGNOSIS — R9430 Abnormal result of cardiovascular function study, unspecified: Secondary | ICD-10-CM | POA: Diagnosis not present

## 2023-06-07 DIAGNOSIS — C50412 Malignant neoplasm of upper-outer quadrant of left female breast: Secondary | ICD-10-CM | POA: Diagnosis not present

## 2023-06-07 DIAGNOSIS — Z87891 Personal history of nicotine dependence: Secondary | ICD-10-CM | POA: Diagnosis not present

## 2023-06-07 DIAGNOSIS — Z86718 Personal history of other venous thrombosis and embolism: Secondary | ICD-10-CM | POA: Diagnosis not present

## 2023-06-07 DIAGNOSIS — I10 Essential (primary) hypertension: Secondary | ICD-10-CM | POA: Diagnosis not present

## 2023-06-07 DIAGNOSIS — Z510 Encounter for antineoplastic radiation therapy: Secondary | ICD-10-CM | POA: Diagnosis not present

## 2023-06-07 DIAGNOSIS — Z170 Estrogen receptor positive status [ER+]: Secondary | ICD-10-CM | POA: Diagnosis not present

## 2023-06-07 DIAGNOSIS — Z9221 Personal history of antineoplastic chemotherapy: Secondary | ICD-10-CM | POA: Diagnosis not present

## 2023-06-07 DIAGNOSIS — C50512 Malignant neoplasm of lower-outer quadrant of left female breast: Secondary | ICD-10-CM | POA: Diagnosis not present

## 2023-06-07 LAB — RAD ONC ARIA SESSION SUMMARY
Course Elapsed Days: 3
Plan Fractions Treated to Date: 3
Plan Fractions Treated to Date: 3
Plan Prescribed Dose Per Fraction: 1.8 Gy
Plan Prescribed Dose Per Fraction: 1.8 Gy
Plan Total Fractions Prescribed: 28
Plan Total Fractions Prescribed: 28
Plan Total Prescribed Dose: 50.4 Gy
Plan Total Prescribed Dose: 50.4 Gy
Reference Point Dosage Given to Date: 5.4 Gy
Reference Point Dosage Given to Date: 5.4 Gy
Reference Point Session Dosage Given: 1.8 Gy
Reference Point Session Dosage Given: 1.8 Gy
Session Number: 3

## 2023-06-10 ENCOUNTER — Other Ambulatory Visit: Payer: Self-pay

## 2023-06-10 ENCOUNTER — Ambulatory Visit
Admission: RE | Admit: 2023-06-10 | Discharge: 2023-06-10 | Disposition: A | Discharge status: Home / Self Care | Payer: Medicare HMO | Source: Ambulatory Visit | Attending: Radiation Oncology | Admitting: Radiation Oncology

## 2023-06-10 ENCOUNTER — Telehealth: Payer: Self-pay | Admitting: Hematology and Oncology

## 2023-06-10 DIAGNOSIS — Z170 Estrogen receptor positive status [ER+]: Secondary | ICD-10-CM | POA: Diagnosis not present

## 2023-06-10 DIAGNOSIS — C50512 Malignant neoplasm of lower-outer quadrant of left female breast: Secondary | ICD-10-CM | POA: Diagnosis not present

## 2023-06-10 DIAGNOSIS — Z87891 Personal history of nicotine dependence: Secondary | ICD-10-CM | POA: Diagnosis not present

## 2023-06-10 DIAGNOSIS — R9430 Abnormal result of cardiovascular function study, unspecified: Secondary | ICD-10-CM | POA: Diagnosis not present

## 2023-06-10 DIAGNOSIS — C50412 Malignant neoplasm of upper-outer quadrant of left female breast: Secondary | ICD-10-CM | POA: Diagnosis not present

## 2023-06-10 DIAGNOSIS — Z9221 Personal history of antineoplastic chemotherapy: Secondary | ICD-10-CM | POA: Diagnosis not present

## 2023-06-10 DIAGNOSIS — I10 Essential (primary) hypertension: Secondary | ICD-10-CM | POA: Diagnosis not present

## 2023-06-10 DIAGNOSIS — Z86718 Personal history of other venous thrombosis and embolism: Secondary | ICD-10-CM | POA: Diagnosis not present

## 2023-06-10 DIAGNOSIS — Z510 Encounter for antineoplastic radiation therapy: Secondary | ICD-10-CM | POA: Diagnosis not present

## 2023-06-10 LAB — RAD ONC ARIA SESSION SUMMARY
Course Elapsed Days: 6
Plan Fractions Treated to Date: 4
Plan Fractions Treated to Date: 4
Plan Prescribed Dose Per Fraction: 1.8 Gy
Plan Prescribed Dose Per Fraction: 1.8 Gy
Plan Total Fractions Prescribed: 28
Plan Total Fractions Prescribed: 28
Plan Total Prescribed Dose: 50.4 Gy
Plan Total Prescribed Dose: 50.4 Gy
Reference Point Dosage Given to Date: 7.2 Gy
Reference Point Dosage Given to Date: 7.2 Gy
Reference Point Session Dosage Given: 1.8 Gy
Reference Point Session Dosage Given: 1.8 Gy
Session Number: 4

## 2023-06-10 NOTE — Telephone Encounter (Signed)
Scheduled appointments per WQ. Patient is aware of the made appointments.  

## 2023-06-11 ENCOUNTER — Ambulatory Visit
Admission: RE | Admit: 2023-06-11 | Discharge: 2023-06-11 | Disposition: A | Discharge status: Home / Self Care | Payer: Medicare HMO | Source: Ambulatory Visit | Attending: Radiation Oncology | Admitting: Radiation Oncology

## 2023-06-11 ENCOUNTER — Other Ambulatory Visit: Payer: Self-pay

## 2023-06-11 DIAGNOSIS — C50512 Malignant neoplasm of lower-outer quadrant of left female breast: Secondary | ICD-10-CM | POA: Diagnosis not present

## 2023-06-11 DIAGNOSIS — I10 Essential (primary) hypertension: Secondary | ICD-10-CM | POA: Diagnosis not present

## 2023-06-11 DIAGNOSIS — C50412 Malignant neoplasm of upper-outer quadrant of left female breast: Secondary | ICD-10-CM | POA: Diagnosis not present

## 2023-06-11 DIAGNOSIS — Z170 Estrogen receptor positive status [ER+]: Secondary | ICD-10-CM | POA: Diagnosis not present

## 2023-06-11 DIAGNOSIS — Z9221 Personal history of antineoplastic chemotherapy: Secondary | ICD-10-CM | POA: Diagnosis not present

## 2023-06-11 DIAGNOSIS — Z87891 Personal history of nicotine dependence: Secondary | ICD-10-CM | POA: Diagnosis not present

## 2023-06-11 DIAGNOSIS — Z86718 Personal history of other venous thrombosis and embolism: Secondary | ICD-10-CM | POA: Diagnosis not present

## 2023-06-11 DIAGNOSIS — R9430 Abnormal result of cardiovascular function study, unspecified: Secondary | ICD-10-CM | POA: Diagnosis not present

## 2023-06-11 DIAGNOSIS — Z510 Encounter for antineoplastic radiation therapy: Secondary | ICD-10-CM | POA: Diagnosis not present

## 2023-06-11 LAB — RAD ONC ARIA SESSION SUMMARY
Course Elapsed Days: 7
Plan Fractions Treated to Date: 5
Plan Fractions Treated to Date: 5
Plan Prescribed Dose Per Fraction: 1.8 Gy
Plan Prescribed Dose Per Fraction: 1.8 Gy
Plan Total Fractions Prescribed: 28
Plan Total Fractions Prescribed: 28
Plan Total Prescribed Dose: 50.4 Gy
Plan Total Prescribed Dose: 50.4 Gy
Reference Point Dosage Given to Date: 9 Gy
Reference Point Dosage Given to Date: 9 Gy
Reference Point Session Dosage Given: 1.8 Gy
Reference Point Session Dosage Given: 1.8 Gy
Session Number: 5

## 2023-06-12 ENCOUNTER — Ambulatory Visit
Admission: RE | Admit: 2023-06-12 | Discharge: 2023-06-12 | Disposition: A | Discharge status: Home / Self Care | Payer: Medicare HMO | Source: Ambulatory Visit | Attending: Radiation Oncology | Admitting: Radiation Oncology

## 2023-06-12 ENCOUNTER — Other Ambulatory Visit: Payer: Self-pay

## 2023-06-12 ENCOUNTER — Ambulatory Visit: Payer: Medicare HMO

## 2023-06-12 DIAGNOSIS — Z87891 Personal history of nicotine dependence: Secondary | ICD-10-CM | POA: Diagnosis not present

## 2023-06-12 DIAGNOSIS — Z86718 Personal history of other venous thrombosis and embolism: Secondary | ICD-10-CM | POA: Diagnosis not present

## 2023-06-12 DIAGNOSIS — C50412 Malignant neoplasm of upper-outer quadrant of left female breast: Secondary | ICD-10-CM | POA: Diagnosis not present

## 2023-06-12 DIAGNOSIS — Z170 Estrogen receptor positive status [ER+]: Secondary | ICD-10-CM | POA: Diagnosis not present

## 2023-06-12 DIAGNOSIS — Z9221 Personal history of antineoplastic chemotherapy: Secondary | ICD-10-CM | POA: Diagnosis not present

## 2023-06-12 DIAGNOSIS — R9430 Abnormal result of cardiovascular function study, unspecified: Secondary | ICD-10-CM | POA: Diagnosis not present

## 2023-06-12 DIAGNOSIS — Z510 Encounter for antineoplastic radiation therapy: Secondary | ICD-10-CM | POA: Diagnosis not present

## 2023-06-12 DIAGNOSIS — C50512 Malignant neoplasm of lower-outer quadrant of left female breast: Secondary | ICD-10-CM | POA: Diagnosis not present

## 2023-06-12 DIAGNOSIS — I10 Essential (primary) hypertension: Secondary | ICD-10-CM | POA: Diagnosis not present

## 2023-06-12 LAB — RAD ONC ARIA SESSION SUMMARY

## 2023-06-13 ENCOUNTER — Inpatient Hospital Stay: Payer: Medicare HMO | Attending: Hematology and Oncology

## 2023-06-13 ENCOUNTER — Inpatient Hospital Stay (HOSPITAL_BASED_OUTPATIENT_CLINIC_OR_DEPARTMENT_OTHER): Payer: Medicare HMO | Admitting: Adult Health

## 2023-06-13 ENCOUNTER — Ambulatory Visit
Admission: RE | Admit: 2023-06-13 | Discharge: 2023-06-13 | Disposition: A | Discharge status: Home / Self Care | Payer: Medicare HMO | Source: Ambulatory Visit | Attending: Radiation Oncology | Admitting: Radiation Oncology

## 2023-06-13 ENCOUNTER — Other Ambulatory Visit: Payer: Self-pay

## 2023-06-13 ENCOUNTER — Encounter: Payer: Self-pay | Admitting: Adult Health

## 2023-06-13 ENCOUNTER — Inpatient Hospital Stay: Payer: Medicare HMO

## 2023-06-13 VITALS — BP 138/67 | HR 72 | Resp 16

## 2023-06-13 VITALS — BP 134/73 | HR 89 | Temp 97.8°F | Resp 18 | Ht 63.0 in | Wt 181.4 lb

## 2023-06-13 DIAGNOSIS — K5900 Constipation, unspecified: Secondary | ICD-10-CM | POA: Insufficient documentation

## 2023-06-13 DIAGNOSIS — Z170 Estrogen receptor positive status [ER+]: Secondary | ICD-10-CM | POA: Insufficient documentation

## 2023-06-13 DIAGNOSIS — G620 Drug-induced polyneuropathy: Secondary | ICD-10-CM | POA: Diagnosis not present

## 2023-06-13 DIAGNOSIS — Z923 Personal history of irradiation: Secondary | ICD-10-CM | POA: Insufficient documentation

## 2023-06-13 DIAGNOSIS — Z5111 Encounter for antineoplastic chemotherapy: Secondary | ICD-10-CM | POA: Insufficient documentation

## 2023-06-13 DIAGNOSIS — C50512 Malignant neoplasm of lower-outer quadrant of left female breast: Secondary | ICD-10-CM

## 2023-06-13 DIAGNOSIS — Z87891 Personal history of nicotine dependence: Secondary | ICD-10-CM | POA: Diagnosis not present

## 2023-06-13 DIAGNOSIS — Z79899 Other long term (current) drug therapy: Secondary | ICD-10-CM | POA: Diagnosis not present

## 2023-06-13 DIAGNOSIS — Z86718 Personal history of other venous thrombosis and embolism: Secondary | ICD-10-CM | POA: Diagnosis not present

## 2023-06-13 DIAGNOSIS — C50412 Malignant neoplasm of upper-outer quadrant of left female breast: Secondary | ICD-10-CM | POA: Diagnosis not present

## 2023-06-13 DIAGNOSIS — Z95828 Presence of other vascular implants and grafts: Secondary | ICD-10-CM

## 2023-06-13 DIAGNOSIS — Z510 Encounter for antineoplastic radiation therapy: Secondary | ICD-10-CM | POA: Diagnosis not present

## 2023-06-13 DIAGNOSIS — I10 Essential (primary) hypertension: Secondary | ICD-10-CM | POA: Diagnosis not present

## 2023-06-13 DIAGNOSIS — Z9221 Personal history of antineoplastic chemotherapy: Secondary | ICD-10-CM | POA: Diagnosis not present

## 2023-06-13 DIAGNOSIS — C20 Malignant neoplasm of rectum: Secondary | ICD-10-CM

## 2023-06-13 DIAGNOSIS — R9430 Abnormal result of cardiovascular function study, unspecified: Secondary | ICD-10-CM | POA: Diagnosis not present

## 2023-06-13 LAB — RAD ONC ARIA SESSION SUMMARY
Course Elapsed Days: 9
Plan Fractions Treated to Date: 7
Plan Fractions Treated to Date: 7
Plan Prescribed Dose Per Fraction: 1.8 Gy
Plan Prescribed Dose Per Fraction: 1.8 Gy
Plan Total Fractions Prescribed: 28
Plan Total Fractions Prescribed: 28
Plan Total Prescribed Dose: 50.4 Gy
Plan Total Prescribed Dose: 50.4 Gy
Reference Point Dosage Given to Date: 12.6 Gy
Reference Point Dosage Given to Date: 12.6 Gy
Reference Point Session Dosage Given: 1.8 Gy
Reference Point Session Dosage Given: 1.8 Gy
Session Number: 7

## 2023-06-13 LAB — CBC WITH DIFFERENTIAL (CANCER CENTER ONLY)
Abs Immature Granulocytes: 0.01 10*3/uL (ref 0.00–0.07)
Basophils Absolute: 0 10*3/uL (ref 0.0–0.1)
Basophils Relative: 1 %
Eosinophils Absolute: 0 10*3/uL (ref 0.0–0.5)
Eosinophils Relative: 1 %
HCT: 34.5 % — ABNORMAL LOW (ref 36.0–46.0)
Hemoglobin: 11.2 g/dL — ABNORMAL LOW (ref 12.0–15.0)
Immature Granulocytes: 0 %
Lymphocytes Relative: 26 %
Lymphs Abs: 0.7 10*3/uL (ref 0.7–4.0)
MCH: 29 pg (ref 26.0–34.0)
MCHC: 32.5 g/dL (ref 30.0–36.0)
MCV: 89.4 fL (ref 80.0–100.0)
Monocytes Absolute: 0.4 10*3/uL (ref 0.1–1.0)
Monocytes Relative: 15 %
Neutro Abs: 1.6 10*3/uL — ABNORMAL LOW (ref 1.7–7.7)
Neutrophils Relative %: 57 %
Platelet Count: 245 10*3/uL (ref 150–400)
RBC: 3.86 MIL/uL — ABNORMAL LOW (ref 3.87–5.11)
RDW: 19.3 % — ABNORMAL HIGH (ref 11.5–15.5)
WBC Count: 2.9 10*3/uL — ABNORMAL LOW (ref 4.0–10.5)
nRBC: 0 % (ref 0.0–0.2)

## 2023-06-13 LAB — CMP (CANCER CENTER ONLY)
ALT: 13 U/L (ref 0–44)
AST: 13 U/L — ABNORMAL LOW (ref 15–41)
Albumin: 3.9 g/dL (ref 3.5–5.0)
Alkaline Phosphatase: 97 U/L (ref 38–126)
Anion gap: 9 (ref 5–15)
BUN: 16 mg/dL (ref 8–23)
CO2: 24 mmol/L (ref 22–32)
Calcium: 9.2 mg/dL (ref 8.9–10.3)
Chloride: 105 mmol/L (ref 98–111)
Creatinine: 1.1 mg/dL — ABNORMAL HIGH (ref 0.44–1.00)
GFR, Estimated: 54 mL/min — ABNORMAL LOW (ref >60)
Glucose, Bld: 119 mg/dL — ABNORMAL HIGH (ref 70–99)
Potassium: 4.3 mmol/L (ref 3.5–5.1)
Sodium: 138 mmol/L (ref 135–145)
Total Bilirubin: 0.2 mg/dL — ABNORMAL LOW (ref 0.3–1.2)
Total Protein: 7.4 g/dL (ref 6.5–8.1)

## 2023-06-13 MED ORDER — HEPARIN SOD (PORK) LOCK FLUSH 100 UNIT/ML IV SOLN
500.0000 [IU] | Freq: Once | INTRAVENOUS | Status: AC | PRN
  Administered 2023-06-13: 500 [IU]

## 2023-06-13 MED ORDER — DIPHENHYDRAMINE HCL 25 MG PO CAPS
50.0000 mg | ORAL_CAPSULE | Freq: Once | ORAL | Status: AC
  Administered 2023-06-13: 50 mg via ORAL
  Filled 2023-06-13: qty 2

## 2023-06-13 MED ORDER — SODIUM CHLORIDE 0.9% FLUSH
10.0000 mL | INTRAVENOUS | Status: DC | PRN
  Administered 2023-06-13: 10 mL

## 2023-06-13 MED ORDER — ACETAMINOPHEN 325 MG PO TABS
650.0000 mg | ORAL_TABLET | Freq: Once | ORAL | Status: AC
  Administered 2023-06-13: 650 mg via ORAL
  Filled 2023-06-13: qty 2

## 2023-06-13 MED ORDER — TRASTUZUMAB-ANNS CHEMO 150 MG IV SOLR
6.0000 mg/kg | Freq: Once | INTRAVENOUS | Status: AC
  Administered 2023-06-13: 525 mg via INTRAVENOUS
  Filled 2023-06-13: qty 25

## 2023-06-13 MED ORDER — SODIUM CHLORIDE 0.9% FLUSH
10.0000 mL | INTRAVENOUS | Status: DC | PRN
  Administered 2023-06-13: 10 mL via INTRAVENOUS

## 2023-06-13 MED ORDER — SODIUM CHLORIDE 0.9 % IV SOLN: Freq: Once | INTRAVENOUS | Status: AC

## 2023-06-13 NOTE — Patient Instructions (Signed)
[facility]  Discharge Instructions: Thank you for choosing [facility] to provide your oncology and hematology care.   If you have a lab appointment with the Cancer Center, please go directly to the Cancer Center and check in at the registration area.   Wear comfortable clothing and clothing appropriate for easy access to any Portacath or PICC line.   We strive to give you quality time with your provider. You may need to reschedule your appointment if you arrive late (15 or more minutes).  Arriving late affects you and other patients whose appointments are after yours.  Also, if you miss three or more appointments without notifying the office, you may be dismissed from the clinic at the provider's discretion.      For prescription refill requests, have your pharmacy contact our office and allow 72 hours for refills to be completed.    Today you received the following chemotherapy and/or immunotherapy agents: Kanjinti.      To help prevent nausea and vomiting after your treatment, we encourage you to take your nausea medication as directed.  BELOW ARE SYMPTOMS THAT SHOULD BE REPORTED IMMEDIATELY: *FEVER GREATER THAN 100.4 F (38 C) OR HIGHER *CHILLS OR SWEATING *NAUSEA AND VOMITING THAT IS NOT CONTROLLED WITH YOUR NAUSEA MEDICATION *UNUSUAL SHORTNESS OF BREATH *UNUSUAL BRUISING OR BLEEDING *URINARY PROBLEMS (pain or burning when urinating, or frequent urination) *BOWEL PROBLEMS (unusual diarrhea, constipation, pain near the anus) TENDERNESS IN MOUTH AND THROAT WITH OR WITHOUT PRESENCE OF ULCERS (sore throat, sores in mouth, or a toothache) UNUSUAL RASH, SWELLING OR PAIN  UNUSUAL VAGINAL DISCHARGE OR ITCHING   Items with * indicate a potential emergency and should be followed up as soon as possible or go to the Emergency Department if any problems should occur.  Please show the CHEMOTHERAPY ALERT CARD or IMMUNOTHERAPY ALERT CARD at  check-in to the Emergency Department and triage nurse.  Should you have questions after your visit or need to cancel or reschedule your appointment, please contact [facility]  Dept: [phone]  and follow the prompts.  Office hours are 8:00 a.m. to 4:30 p.m. Monday - Friday. Please note that voicemails left after 4:00 p.m. may not be returned until the following business day.  We are closed weekends and major holidays. You have access to a nurse at all times for urgent questions. Please call the main number to the clinic Dept: [phone] and follow the prompts.   For any non-urgent questions, you may also contact your provider using MyChart. We now offer e-Visits for anyone 18 and older to request care online for non-urgent symptoms. For details visit [email or web address].   Also download the MyChart app! Go to the app store, search "MyChart", open the app, select [facility], and log in with your MyChart username and password.   

## 2023-06-13 NOTE — Progress Notes (Signed)
TRIAL S2205, ICE COMPRESS: RANDOMIZED TRIAL OF LIMB CRYOCOMPRESSION VERSUS CONTINUOUS COMPRESSION VERSUS LOW CYCLIC COMPRESSION FOR THE PREVENTION  OF TAXANE-INDUCED PERIPHERAL NEUROPATHY   Patient arrives today Unaccompanied for the 24 week visit.    PROs: Per study protocol, all PROs required for this visit were completed prior to other study activities and completeness has been verified.     LABS: No study labs due this visit.   MD/PROVIDER VISIT: Patient sees NP Lillard Anes for today's visit.   ADVERSE EVENTS: [name] reports AE as below. No new AE.  ADVERSE EVENT LOG:  AHLANI WICKES [id number]  06/13/2023  Adverse Event Log  Event Grade Onset Date Resolved Date Drug Name Attribution Treatment Comments  Fall Grade 1 02/14/23 02/14/23 N/a Unrelated to device; definitely related to taxane N/a Pt tripped and caught herself but did hit the ground  Peripheral sensory neuropathy Grade 1 02/10/23  03/20/23 N/a Unrelated to device, definitely related to taxane N/a Fingers are slightly numb  Vascular access complications Grade 3 01/29/23  04/01/23 N/a Unrelated Anticoagulation    Peripheral sensory neuropathy Grade 2 03/21/23     Unrelated to device, definitely related to taxane Stopping taxane; will not get treatment going forward Patient reports feeling tingling and numb all over her body                                       NEURO ASSESSMENT: The neuro assessment was completed by this nurse. [name] tolerated all testing without complaint.   GIFT CARD: This study does not provide visit compensation.   DISPOSITION: Upon completion off all study requirements, patient remained in the exam room to see NP.   The patient was thanked for their time and continued voluntary participation in this study. [name] has been provided direct contact information and is encouraged to contact this Nurse for any needs or questions. Explained that  next research visit will be her one year assessment; she verbalized understanding.  Tonya Chance Lucien Budney, RN, BSN, [facility]  Her  [facility] Clinical Research Nurse [facility] Direct Dial [phone]  Pager [phone] 06/13/2023 9:10 AM

## 2023-06-13 NOTE — Assessment & Plan Note (Signed)
10/17/2022 Diffuse bilateral breast pains.  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) no distant metastatic disease    Treatment plan: Adjuvant chemotherapy with TCH followed by Herceptin maintenance Adjuvant radiation Adjuvant antiestrogen therapy --------------------------------------------------------------------------------------------------------------------- Current Treatment: Herceptin and adjuvant radiation Chemo toxicities: Constipation: encouraged increased water intake Peripheral neuropathy: slightly improved, will continue to monitor At risk for heart failure: most recent echo normal, next echo scheduled 07/01/2023, asymptomatic today  Right neck swelling: Improved, taking Xarelto daily   She will return to clinic every 3 weeks for Herceptin, and will see myself or Dr. Pamelia Hoit with every other treatment.

## 2023-06-13 NOTE — Progress Notes (Signed)
[facility] Cancer Follow up:    Renaye Rakers, MD [address] [place] [address]   DIAGNOSIS:  Cancer Staging  History of rectal cancer Staging form: Colon and Rectum, AJCC 7th Edition - Clinical: Stage I (T2, N0, M0) - Signed by Artis Delay, MD on 12/28/2013 Staged by: Managing physician Diagnostic confirmation: Positive histology Specimen type: Excision Histopathologic type: Adenocarcinoma, NOS - Pathologic: Stage I (T2, N0, cM0) - Signed by Artis Delay, MD on 12/28/2013 Diagnostic confirmation: Positive histology Specimen type: Excision Histopathologic type: Adenocarcinoma, NOS  Malignant neoplasm of upper-outer quadrant of left breast in female, estrogen receptor positive (HCC) Staging form: Breast, AJCC 8th Edition - Clinical: Stage IA (cT1a, cN0, cM0, G2, ER+, PR+, HER2+) - Signed by [name], MD on 10/31/2022 Stage prefix: Initial diagnosis Histologic grading system: 3 grade system   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 after neoadjuvant chemotherapy and radiation therapy   Primary site: Colon and Rectum   Staging method: AJCC 7th Edition   Clinical: Stage I (T2, N0, M0) signed by Artis Delay, MD on 12/28/2013  1:49 PM   Pathologic: Stage I (T2, N0, cM0) signed by Artis Delay, MD on 12/28/2013  1:49 PM   Summary: Stage I (T2, N0, cM0)     History of rectal cancer  11/17/2009 Procedure   Colonoscopy and rectal biopsy confirmed moderately differentiated adenocarcinoma   11/18/2009 Imaging   Transrectal ultrasound place staging T3 lesion   11/18/2009 Imaging   Staging CT scan of the chest, abdomen and pelvis showed multiple lesions in the liver as well as paraspinal mass of unknown etiology   12/05/2009 - 04/03/2010 Chemotherapy   The patient completed new adjuvant chemotherapy with 5-FU and radiation therapy   12/14/2009 Imaging   PET CT scan show faint  metabolic activity in the paraspinal mass with no activity in the liver   04/24/2010 Surgery   She had surgical resection with negative margins. Final pathology was T2, N0, M0 (down staged by chemoradiation therapy from T3, N0, M0)   11/19/2012 Imaging   MRI of the liver confirmed benign hemangioma   01/08/2014 Procedure   Colonoscopy and biopsy was negative   12/21/2014 Imaging   CT scan showed no recurrence of colon cancer. Incidentally, paraspinal mass is slightly larger.   10/04/2016 Procedure   She had repeat colonoscopy which showed two 3 mm polyps in the transverse colon, removed with a cold snare. Resected and retrieved. The examination was otherwise normal on direct and retroflexion views.   10/04/2016 Pathology Results   Surgical [P], transverse, polyp(s) - SERRATED POLYP WITH FOCAL FEATURES SUGGESTIVE OF SESSILE SERRATED POLYP/ADENOMA, ONE FRAGMENT. - HYPERPLASTIC POLYP WITHOUT DYSPLASIA, ONE FRAGMENT. - BENIGN COLORECTAL MUCOSA WITHOUT DYSPLASIA, ONE FRAGMENT. - SEE COMMENT. Microscopic Comment After routine specimen processing, three fragments are identified. One of the fragments demonstrates a serrated horizontally situated crypt, which is suggestive of superficial sampling of a sessile serrated polyp/adenoma (the differential includes another fragment of hyperplastic polyp). Although this is the case, the finding is very focal and not definitive.    11/09/2022 Genetic Testing   Negative genetics for [facility] Multi-Cancer +RNA Panel.  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 Report date is 11/09/2022.   The Multi-Cancer + RNA Panel offered by [facility] includes sequencing and/or deletion/duplication analysis of the following 70 genes:  AIP*, ALK, APC*, ATM*, AXIN2*, BAP1*, BARD1*, BLM*, BMPR1A*, BRCA1*,  BRCA2*, BRIP1*, CDC73*, CDH1*, CDK4, CDKN1B*, CDKN2A, CHEK2*, CTNNA1*, DICER1*, EPCAM (del/dup only), EGFR, FH*, FLCN*, GREM1 (promoter dup only), HOXB13,  KIT, LZTR1, MAX*, MBD4, MEN1*, MET, MITF, MLH1*, MSH2*, MSH3*, MSH6*, MUTYH*, NF1*, NF2*, NTHL1*, PALB2*, PDGFRA, PMS2*, POLD1*, POLE*, POT1*, PRKAR1A*, PTCH1*, PTEN*, RAD51C*, RAD51D*, RB1*, RET, SDHA* (sequencing only), SDHAF2*, SDHB*, SDHC*, SDHD*, SMAD4*, SMARCA4*, SMARCB1*, SMARCE1*, STK11*, SUFU*, TMEM127*, TP53*, TSC1*, TSC2*, VHL*. RNA analysis is performed for * genes.    11/09/2022 Genetic Testing   Negative genetics for [facility] Multi-Cancer +RNA Panel.  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 Report date is 11/09/2022.   The Multi-Cancer + RNA Panel offered by [facility] includes sequencing and/or deletion/duplication analysis of the following 70 genes:  AIP*, ALK, APC*, ATM*, AXIN2*, BAP1*, BARD1*, BLM*, BMPR1A*, BRCA1*, BRCA2*, BRIP1*, CDC73*, CDH1*, CDK4, CDKN1B*, CDKN2A, CHEK2*, CTNNA1*, DICER1*, EPCAM (del/dup only), EGFR, FH*, FLCN*, GREM1 (promoter dup only), HOXB13, KIT, LZTR1, MAX*, MBD4, MEN1*, MET, MITF, MLH1*, MSH2*, MSH3*, MSH6*, MUTYH*, NF1*, NF2*, NTHL1*, PALB2*, PDGFRA, PMS2*, POLD1*, POLE*, POT1*, PRKAR1A*, PTCH1*, PTEN*, RAD51C*, RAD51D*, RB1*, RET, SDHA* (sequencing only), SDHAF2*, SDHB*, SDHC*, SDHD*, SMAD4*, SMARCA4*, SMARCB1*, SMARCE1*, STK11*, SUFU*, TMEM127*, TP53*, TSC1*, TSC2*, VHL*. RNA analysis is performed for * genes.    Malignant neoplasm of upper-outer quadrant of left breast in female, estrogen receptor positive (HCC)  10/17/2022 Initial Diagnosis   Diffuse bilateral breast pains.  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 Report date is 11/09/2022.   The Multi-Cancer + RNA Panel offered by [facility] includes sequencing and/or deletion/duplication analysis of the following 70 genes:  AIP*, ALK, APC*, ATM*, AXIN2*, BAP1*, BARD1*, BLM*, BMPR1A*, BRCA1*, BRCA2*, BRIP1*, CDC73*, CDH1*, CDK4, CDKN1B*, CDKN2A, CHEK2*, CTNNA1*, DICER1*, EPCAM (del/dup only), EGFR, FH*, FLCN*, GREM1 (promoter dup only), HOXB13, KIT, LZTR1, MAX*, MBD4, MEN1*, MET, MITF, MLH1*, MSH2*, MSH3*, MSH6*, MUTYH*, NF1*, NF2*, NTHL1*, PALB2*, PDGFRA, PMS2*, POLD1*, POLE*, POT1*, PRKAR1A*, PTCH1*, PTEN*, RAD51C*, RAD51D*, RB1*, RET, SDHA* (sequencing only), SDHAF2*, SDHB*, SDHC*, SDHD*, SMAD4*, SMARCA4*, SMARCB1*, SMARCE1*, STK11*, SUFU*, TMEM127*, TP53*, TSC1*, TSC2*, VHL*. RNA analysis is performed for * genes.    11/14/2022 Surgery   Left lumpectomy: Grade 2 ILC with LCIS 3.5 cm, 1/8 lymph node with micrometastases ER 100%, PR 40%, Ki-67 20%, HER2 3+ positive; lymph node prognostic panel: ER 100% PR 20% Ki-67 10%, HER2 2+ by IHC, FISH negative ratio 1.14   Malignant neoplasm of lower-outer quadrant of left breast of female, estrogen receptor positive (HCC)  10/30/2022 Initial Diagnosis   Malignant neoplasm of lower-outer quadrant of left breast of female, estrogen receptor positive (HCC)   12/27/2022 -  Chemotherapy   Patient is on Treatment Plan : BREAST Docetaxel + Carboplatin + Trastuzumab (TCH) q21d / Trastuzumab q21d       CURRENT THERAPY: Herceptin, adjuvant radiation  INTERVAL HISTORY: Tonya Vincent 72 y.o. female returns  for follow up of her breast cancer.  She is doing well today.  She is receiving herceptin every three weeks with good tolerance.  She is also undergoing  adjuvant radiation with good tolerance.    She has lost about 5 pounds and tells me that she is on Ozempic.  She notes her A1c decreased to 6.9.  She has occasional constipation and does not drink as much water as she could.    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  She also has some continued neck swelling on the right side of her neck where her port is, and at the location of her previous DVT.  She is taking Xarelto daily with good tolerance.   Her most recent echocardiogram occurred on 03/28/2023 and dmeonstrated a LVEF of 55-60%. Her next echo is scheduled 07/01/2023.    Patient Active Problem List   Diagnosis Date Noted   Deep venous thrombosis (HCC) 02/28/2023   Malignant neoplasm of rectum (HCC) 01/18/2023   Port-A-Cath in place 12/27/2022   Genetic testing 11/12/2022   Malignant neoplasm of lower-outer quadrant of left breast of female, estrogen receptor positive (HCC) 10/30/2022   Malignant neoplasm of upper-outer quadrant of left breast in female, estrogen receptor positive (HCC) 10/23/2022   S/P ORIF (open reduction internal fixation) fracture right ankle 04/11/20 04/21/2020   Closed trimalleolar fracture of right ankle    History of colonic polyps 12/18/2019   Fecal smearing 12/18/2019   Left arm pain 02/04/2019   Rectal bleeding 02/06/2018   Microcytosis 01/01/2017   Essential hypertension 01/02/2016   Change in stool caliber 12/31/2014   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/29/2014   Rectal cancer (HCC) dx'd 11/16/09   xrt comp 01/2010; chemo comp 01/2011   Tubular adenoma of colon 01/08/2014    SURGICAL HISTORY: Past Surgical History:  Procedure Laterality Date   ABDOMINAL HYSTERECTOMY     for uterine fibroids   BREAST LUMPECTOMY WITH RADIOACTIVE SEED LOCALIZATION Left 11/14/2022   Procedure: LEFT BREAST RADIOACTIVE SEED GUIDED LUMPECTOMY x2;  Surgeon: Abigail Miyamoto, MD;  Location: [facility];  Service: General;  Laterality: Left;   COLON SURGERY N/A 04/24/2010   "took out half rectum"; rectal cancer   EYE SURGERY     lasik surgery   HAND SURGERY     carpal tunnel- left   HEMORRHOID SURGERY N/A 1980   IR CV LINE INJECTION  03/22/2023   IR IMAGING GUIDED PORT INSERTION  04/09/2023   IR REMOVAL TUN ACCESS W/ PORT W/O FL MOD SED  04/09/2023   ORIF ANKLE FRACTURE Right 04/11/2020   Procedure: OPEN REDUCTION INTERNAL FIXATION (ORIF) ANKLE FRACTURE;  Surgeon: Vickki Hearing, MD;  Location: [facility];  Service: Orthopedics;  Laterality: Right;   PORT-A-CATH REMOVAL N/A 02/17/2019   Procedure: REMOVAL PORT-A-CATH;  Surgeon: Abigail Miyamoto, MD;  Location: [facility] OR;  Service: General;  Laterality: N/A;   PORTACATH PLACEMENT     2011   PORTACATH PLACEMENT N/A 12/17/2022   Procedure: INSERTION PORT-A-CATH;  Surgeon: Abigail Miyamoto, MD;  Location: [facility] OR;  Service: General;  Laterality: N/A;   RECTAL SURGERY     2010   SENTINEL NODE BIOPSY Left 11/14/2022   Procedure: SENTINEL LYMPH NODE BIOPSY;  Surgeon: Abigail Miyamoto, MD;  Location: [facility];  Service: General;  Laterality: Left;   WISDOM TOOTH EXTRACTION      SOCIAL HISTORY: Social History   Socioeconomic History   Marital status: Single    Spouse name:  Not on file   Number of children: Not on file   Years of education: Not on file   Highest education level: Not on file  Occupational History   Not on file  Tobacco Use   Smoking status: Former    Current packs/day: 0.00    Average packs/day: 0.5 packs/day for 35.0 years (17.5 ttl pk-yrs)    Types: Cigarettes, Cigars    Start date: 12/03/1974    Quit date: 12/03/2009    Years since quitting: 13.5   Smokeless tobacco: Never  Vaping Use   Vaping status: Never Used  Substance and Sexual Activity   Alcohol use: Yes    Alcohol/week: 7.0 standard drinks of alcohol    Types: 7 Cans of beer per week    Comment: socially   Drug use: No   Sexual activity: Not Currently    Birth control/protection: Surgical    Comment: Hyst  Other Topics Concern   Not on file  Social History Narrative   Not on file   Social Determinants of Health   Financial Resource Strain: Not on file  Food Insecurity: No Food Insecurity (05/22/2023)   Hunger Vital Sign    Worried About Running Out of Food in the Last Year: Never true    Ran Out of Food in the Last Year: Never true  Transportation Needs: No Transportation Needs (05/22/2023)   PRAPARE - Administrator, Civil Service (Medical): No    Lack of Transportation (Non-Medical): No  Physical Activity: Not on file  Stress: Not on file  Social Connections: Not on file  Intimate Partner Violence: Not At Risk (05/22/2023)   Humiliation, Afraid, Rape, and Kick questionnaire    Fear of Current or Ex-Partner: No    Emotionally Abused: No    Physically Abused: No    Sexually Abused: No    FAMILY HISTORY: Family History  Problem Relation Age of Onset   Leukemia Father 69   Breast cancer Maternal Aunt        dx > 50; mother's maternal half sister   Kidney cancer Maternal Uncle        dx > 50   Breast cancer Paternal Aunt        dx > 50   Breast cancer Cousin        dx > 12; paternal female cousin   Mesothelioma Cousin        paternal female  cousin   Colon cancer Neg Hx    Esophageal cancer Neg Hx    Stomach cancer Neg Hx    Rectal cancer Neg Hx     Review of Systems  Constitutional:  Negative for appetite change, chills, fatigue, fever and  unexpected weight change.  HENT:   Negative for hearing loss, lump/mass and trouble swallowing.   Eyes:  Negative for eye problems and icterus.  Respiratory:  Negative for chest tightness, cough and shortness of breath.   Cardiovascular:  Negative for chest pain, leg swelling and palpitations.  Gastrointestinal:  Positive for constipation. Negative for abdominal distention, abdominal pain, diarrhea, nausea and vomiting.  Endocrine: Negative for hot flashes.  Genitourinary:  Negative for difficulty urinating.   Musculoskeletal:  Negative for arthralgias.  Skin:  Negative for itching and rash.  Neurological:  Negative for dizziness, extremity weakness, headaches and numbness.  Hematological:  Negative for adenopathy. Does not bruise/bleed easily.  Psychiatric/Behavioral:  Negative for depression. The patient is not nervous/anxious.       PHYSICAL EXAMINATION    Vitals:   06/13/23 0819  BP: 134/73  Pulse: 89  Resp: 18  Temp: 97.8 F (36.6 C)  SpO2: 97%    Physical Exam Constitutional:      General: She is not in acute distress.    Appearance: Normal appearance. She is not toxic-appearing.  HENT:     Head: Normocephalic and atraumatic.     Mouth/Throat:     Mouth: Mucous membranes are moist.     Pharynx: Oropharynx is clear. No oropharyngeal exudate or posterior oropharyngeal erythema.  Eyes:     General: No scleral icterus. Cardiovascular:     Rate and Rhythm: Normal rate and regular rhythm.     Pulses: Normal pulses.     Heart sounds: Normal heart sounds.  Pulmonary:     Effort: Pulmonary effort is normal.     Breath sounds: Normal breath sounds.  Abdominal:     General: Abdomen is flat. Bowel sounds are normal. There is no distension.     Palpations: Abdomen is  soft.     Tenderness: There is no abdominal tenderness.  Musculoskeletal:        General: No swelling.     Cervical back: Neck supple.  Lymphadenopathy:     Cervical: No cervical adenopathy.  Skin:    General: Skin is warm and dry.     Findings: No rash.  Neurological:     General: No focal deficit present.     Mental Status: She is alert.  Psychiatric:        Mood and Affect: Mood normal.        Behavior: Behavior normal.     LABORATORY DATA:  CBC    Component Value Date/Time   WBC 2.9 (L) 06/13/2023 0748   WBC 10.2 01/30/2023 2208   RBC 3.86 (L) 06/13/2023 0748   HGB 11.2 (L) 06/13/2023 0748   HGB 12.6 01/01/2017 0841   HCT 34.5 (L) 06/13/2023 0748   HCT 39.2 01/01/2017 0841   PLT 245 06/13/2023 0748   PLT 301 01/01/2017 0841   MCV 89.4 06/13/2023 0748   MCV 78.7 (L) 01/01/2017 0841   MCH 29.0 06/13/2023 0748   MCHC 32.5 06/13/2023 0748   RDW 19.3 (H) 06/13/2023 0748   RDW 17.9 (H) 01/01/2017 0841   LYMPHSABS 0.7 06/13/2023 0748   LYMPHSABS 1.3 01/01/2017 0841   MONOABS 0.4 06/13/2023 0748   MONOABS 0.5 01/01/2017 0841   EOSABS 0.0 06/13/2023 0748   EOSABS 0.1 01/01/2017 0841   BASOSABS 0.0 06/13/2023 0748   BASOSABS 0.0 01/01/2017 0841    CMP     Component Value Date/Time   NA 138 06/13/2023 0748   NA 141 01/01/2017 0841   K 4.3  06/13/2023 0748   K 3.7 01/01/2017 0841   CL 105 06/13/2023 0748   CL 99 11/05/2012 0842   CO2 24 06/13/2023 0748   CO2 25 01/01/2017 0841   GLUCOSE 119 (H) 06/13/2023 0748   GLUCOSE 106 01/01/2017 0841   GLUCOSE 167 (H) 11/05/2012 0842   BUN 16 06/13/2023 0748   BUN 18.2 01/01/2017 0841   CREATININE 1.10 (H) 06/13/2023 0748   CREATININE 1.3 (H) 01/01/2017 0841   CALCIUM 9.2 06/13/2023 0748   CALCIUM 9.5 01/01/2017 0841   PROT 7.4 06/13/2023 0748   PROT 7.5 01/01/2017 0841   ALBUMIN 3.9 06/13/2023 0748   ALBUMIN 4.0 01/01/2017 0841   AST 13 (L) 06/13/2023 0748   AST 13 01/01/2017 0841   ALT 13 06/13/2023 0748    ALT 16 01/01/2017 0841   ALKPHOS 97 06/13/2023 0748   ALKPHOS 89 01/01/2017 0841   BILITOT 0.2 (L) 06/13/2023 0748   BILITOT 0.29 01/01/2017 0841   GFRNONAA 54 (L) 06/13/2023 0748   GFRAA >60 05/27/2020 1443   GFRAA 52 (L) 01/12/2019 1330      ASSESSMENT and THERAPY PLAN:   Malignant neoplasm of upper-outer quadrant of left breast in female, estrogen receptor positive (HCC) 10/17/2022 Diffuse bilateral breast pains.  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) no distant metastatic disease    Treatment plan: Adjuvant chemotherapy with TCH followed by Herceptin maintenance Adjuvant radiation Adjuvant antiestrogen therapy --------------------------------------------------------------------------------------------------------------------- Current Treatment: Herceptin and adjuvant radiation Chemo toxicities: Constipation: encouraged increased water intake Peripheral neuropathy: slightly improved, will continue to monitor At risk for heart failure: most recent echo normal, next echo scheduled 07/01/2023, asymptomatic today  Right neck swelling: Improved, taking Xarelto daily   She will return to clinic every 3 weeks for Herceptin, and will see myself or Dr. Pamelia Hoit with every other treatment.      All questions were answered. The patient knows to call the clinic with any problems,  questions or concerns. We can certainly see the patient much sooner if necessary.  Total encounter time:30 minutes*in face-to-face visit time, chart review, lab review, care coordination, order entry, and documentation of the encounter time.    Lillard Anes, NP 06/13/23 9:18 AM Medical Oncology and Hematology [facility] [address] [place], [place] [address] Tel. [phone]    Fax. [phone]  *Total Encounter Time as defined by the Centers for Medicare and Medicaid Services includes, in addition to the face-to-face time of a patient visit (documented in the note above) non-face-to-face time: obtaining and reviewing outside history, ordering and reviewing medications, tests or procedures, care coordination (communications with other health care professionals or caregivers) and documentation in the medical record.

## 2023-06-14 ENCOUNTER — Ambulatory Visit
Admission: RE | Admit: 2023-06-14 | Discharge: 2023-06-14 | Disposition: A | Discharge status: Home / Self Care | Payer: Medicare HMO | Source: Ambulatory Visit | Attending: Radiation Oncology | Admitting: Radiation Oncology

## 2023-06-14 ENCOUNTER — Other Ambulatory Visit: Payer: Self-pay

## 2023-06-14 DIAGNOSIS — C50412 Malignant neoplasm of upper-outer quadrant of left female breast: Secondary | ICD-10-CM | POA: Diagnosis not present

## 2023-06-14 DIAGNOSIS — Z9221 Personal history of antineoplastic chemotherapy: Secondary | ICD-10-CM | POA: Diagnosis not present

## 2023-06-14 DIAGNOSIS — C50512 Malignant neoplasm of lower-outer quadrant of left female breast: Secondary | ICD-10-CM | POA: Diagnosis not present

## 2023-06-14 DIAGNOSIS — I10 Essential (primary) hypertension: Secondary | ICD-10-CM | POA: Diagnosis not present

## 2023-06-14 DIAGNOSIS — Z510 Encounter for antineoplastic radiation therapy: Secondary | ICD-10-CM | POA: Diagnosis not present

## 2023-06-14 DIAGNOSIS — Z86718 Personal history of other venous thrombosis and embolism: Secondary | ICD-10-CM | POA: Diagnosis not present

## 2023-06-14 DIAGNOSIS — Z87891 Personal history of nicotine dependence: Secondary | ICD-10-CM | POA: Diagnosis not present

## 2023-06-14 DIAGNOSIS — R9430 Abnormal result of cardiovascular function study, unspecified: Secondary | ICD-10-CM | POA: Diagnosis not present

## 2023-06-14 DIAGNOSIS — Z170 Estrogen receptor positive status [ER+]: Secondary | ICD-10-CM | POA: Diagnosis not present

## 2023-06-14 LAB — RAD ONC ARIA SESSION SUMMARY
Course Elapsed Days: 10
Plan Fractions Treated to Date: 8
Plan Fractions Treated to Date: 8
Plan Prescribed Dose Per Fraction: 1.8 Gy
Plan Prescribed Dose Per Fraction: 1.8 Gy
Plan Total Fractions Prescribed: 28
Plan Total Fractions Prescribed: 28
Plan Total Prescribed Dose: 50.4 Gy
Plan Total Prescribed Dose: 50.4 Gy
Reference Point Dosage Given to Date: 14.4 Gy
Reference Point Dosage Given to Date: 14.4 Gy
Reference Point Session Dosage Given: 1.8 Gy
Reference Point Session Dosage Given: 1.8 Gy
Session Number: 8

## 2023-06-17 ENCOUNTER — Other Ambulatory Visit: Payer: Self-pay

## 2023-06-17 ENCOUNTER — Ambulatory Visit
Admission: RE | Admit: 2023-06-17 | Discharge: 2023-06-17 | Disposition: A | Discharge status: Home / Self Care | Payer: Medicare HMO | Source: Ambulatory Visit | Attending: Radiation Oncology | Admitting: Radiation Oncology

## 2023-06-17 DIAGNOSIS — I10 Essential (primary) hypertension: Secondary | ICD-10-CM | POA: Diagnosis not present

## 2023-06-17 DIAGNOSIS — R9430 Abnormal result of cardiovascular function study, unspecified: Secondary | ICD-10-CM | POA: Diagnosis not present

## 2023-06-17 DIAGNOSIS — Z170 Estrogen receptor positive status [ER+]: Secondary | ICD-10-CM | POA: Diagnosis not present

## 2023-06-17 DIAGNOSIS — Z87891 Personal history of nicotine dependence: Secondary | ICD-10-CM | POA: Diagnosis not present

## 2023-06-17 DIAGNOSIS — Z9221 Personal history of antineoplastic chemotherapy: Secondary | ICD-10-CM | POA: Diagnosis not present

## 2023-06-17 DIAGNOSIS — C50512 Malignant neoplasm of lower-outer quadrant of left female breast: Secondary | ICD-10-CM | POA: Diagnosis not present

## 2023-06-17 DIAGNOSIS — C50412 Malignant neoplasm of upper-outer quadrant of left female breast: Secondary | ICD-10-CM | POA: Diagnosis not present

## 2023-06-17 DIAGNOSIS — Z510 Encounter for antineoplastic radiation therapy: Secondary | ICD-10-CM | POA: Diagnosis not present

## 2023-06-17 DIAGNOSIS — Z86718 Personal history of other venous thrombosis and embolism: Secondary | ICD-10-CM | POA: Diagnosis not present

## 2023-06-17 LAB — RAD ONC ARIA SESSION SUMMARY
Course Elapsed Days: 13
Plan Fractions Treated to Date: 9
Plan Fractions Treated to Date: 9
Plan Prescribed Dose Per Fraction: 1.8 Gy
Plan Prescribed Dose Per Fraction: 1.8 Gy
Plan Total Fractions Prescribed: 28
Plan Total Fractions Prescribed: 28
Plan Total Prescribed Dose: 50.4 Gy
Plan Total Prescribed Dose: 50.4 Gy
Reference Point Dosage Given to Date: 16.2 Gy
Reference Point Dosage Given to Date: 16.2 Gy
Reference Point Session Dosage Given: 1.8 Gy
Reference Point Session Dosage Given: 1.8 Gy
Session Number: 9

## 2023-06-18 ENCOUNTER — Other Ambulatory Visit: Payer: Self-pay

## 2023-06-18 ENCOUNTER — Ambulatory Visit
Admission: RE | Admit: 2023-06-18 | Discharge: 2023-06-18 | Disposition: A | Discharge status: Home / Self Care | Payer: Medicare HMO | Source: Ambulatory Visit | Attending: Radiation Oncology | Admitting: Radiation Oncology

## 2023-06-18 ENCOUNTER — Ambulatory Visit: Payer: Medicare HMO

## 2023-06-18 DIAGNOSIS — Z87891 Personal history of nicotine dependence: Secondary | ICD-10-CM | POA: Diagnosis not present

## 2023-06-18 DIAGNOSIS — R9430 Abnormal result of cardiovascular function study, unspecified: Secondary | ICD-10-CM | POA: Diagnosis not present

## 2023-06-18 DIAGNOSIS — C50412 Malignant neoplasm of upper-outer quadrant of left female breast: Secondary | ICD-10-CM | POA: Diagnosis not present

## 2023-06-18 DIAGNOSIS — Z86718 Personal history of other venous thrombosis and embolism: Secondary | ICD-10-CM | POA: Diagnosis not present

## 2023-06-18 DIAGNOSIS — Z510 Encounter for antineoplastic radiation therapy: Secondary | ICD-10-CM | POA: Diagnosis not present

## 2023-06-18 DIAGNOSIS — Z9221 Personal history of antineoplastic chemotherapy: Secondary | ICD-10-CM | POA: Diagnosis not present

## 2023-06-18 DIAGNOSIS — C50512 Malignant neoplasm of lower-outer quadrant of left female breast: Secondary | ICD-10-CM | POA: Diagnosis not present

## 2023-06-18 DIAGNOSIS — I10 Essential (primary) hypertension: Secondary | ICD-10-CM | POA: Diagnosis not present

## 2023-06-18 DIAGNOSIS — Z170 Estrogen receptor positive status [ER+]: Secondary | ICD-10-CM | POA: Diagnosis not present

## 2023-06-18 LAB — RAD ONC ARIA SESSION SUMMARY
Course Elapsed Days: 14
Plan Fractions Treated to Date: 10
Plan Fractions Treated to Date: 10
Plan Prescribed Dose Per Fraction: 1.8 Gy
Plan Prescribed Dose Per Fraction: 1.8 Gy
Plan Total Fractions Prescribed: 28
Plan Total Fractions Prescribed: 28
Plan Total Prescribed Dose: 50.4 Gy
Plan Total Prescribed Dose: 50.4 Gy
Reference Point Dosage Given to Date: 18 Gy
Reference Point Dosage Given to Date: 18 Gy
Reference Point Session Dosage Given: 1.8 Gy
Reference Point Session Dosage Given: 1.8 Gy
Session Number: 10

## 2023-06-19 ENCOUNTER — Other Ambulatory Visit: Payer: Self-pay

## 2023-06-19 ENCOUNTER — Ambulatory Visit
Admission: RE | Admit: 2023-06-19 | Discharge: 2023-06-19 | Disposition: A | Discharge status: Home / Self Care | Payer: Medicare HMO | Source: Ambulatory Visit | Attending: Radiation Oncology | Admitting: Radiation Oncology

## 2023-06-19 DIAGNOSIS — Z510 Encounter for antineoplastic radiation therapy: Secondary | ICD-10-CM | POA: Diagnosis not present

## 2023-06-19 DIAGNOSIS — I10 Essential (primary) hypertension: Secondary | ICD-10-CM | POA: Diagnosis not present

## 2023-06-19 DIAGNOSIS — Z9221 Personal history of antineoplastic chemotherapy: Secondary | ICD-10-CM | POA: Diagnosis not present

## 2023-06-19 DIAGNOSIS — C50412 Malignant neoplasm of upper-outer quadrant of left female breast: Secondary | ICD-10-CM | POA: Diagnosis not present

## 2023-06-19 DIAGNOSIS — Z170 Estrogen receptor positive status [ER+]: Secondary | ICD-10-CM | POA: Diagnosis not present

## 2023-06-19 DIAGNOSIS — Z86718 Personal history of other venous thrombosis and embolism: Secondary | ICD-10-CM | POA: Diagnosis not present

## 2023-06-19 DIAGNOSIS — R9430 Abnormal result of cardiovascular function study, unspecified: Secondary | ICD-10-CM | POA: Diagnosis not present

## 2023-06-19 DIAGNOSIS — C50512 Malignant neoplasm of lower-outer quadrant of left female breast: Secondary | ICD-10-CM | POA: Diagnosis not present

## 2023-06-19 DIAGNOSIS — Z87891 Personal history of nicotine dependence: Secondary | ICD-10-CM | POA: Diagnosis not present

## 2023-06-19 LAB — RAD ONC ARIA SESSION SUMMARY

## 2023-06-20 ENCOUNTER — Other Ambulatory Visit: Payer: Self-pay

## 2023-06-20 ENCOUNTER — Ambulatory Visit
Admission: RE | Admit: 2023-06-20 | Discharge: 2023-06-20 | Disposition: A | Discharge status: Home / Self Care | Payer: Medicare HMO | Source: Ambulatory Visit | Attending: Radiation Oncology | Admitting: Radiation Oncology

## 2023-06-20 DIAGNOSIS — I10 Essential (primary) hypertension: Secondary | ICD-10-CM | POA: Diagnosis not present

## 2023-06-20 DIAGNOSIS — R9430 Abnormal result of cardiovascular function study, unspecified: Secondary | ICD-10-CM | POA: Diagnosis not present

## 2023-06-20 DIAGNOSIS — Z510 Encounter for antineoplastic radiation therapy: Secondary | ICD-10-CM | POA: Diagnosis not present

## 2023-06-20 DIAGNOSIS — C50412 Malignant neoplasm of upper-outer quadrant of left female breast: Secondary | ICD-10-CM | POA: Diagnosis not present

## 2023-06-20 DIAGNOSIS — Z9221 Personal history of antineoplastic chemotherapy: Secondary | ICD-10-CM | POA: Diagnosis not present

## 2023-06-20 DIAGNOSIS — Z86718 Personal history of other venous thrombosis and embolism: Secondary | ICD-10-CM | POA: Diagnosis not present

## 2023-06-20 DIAGNOSIS — C50512 Malignant neoplasm of lower-outer quadrant of left female breast: Secondary | ICD-10-CM | POA: Diagnosis not present

## 2023-06-20 DIAGNOSIS — Z87891 Personal history of nicotine dependence: Secondary | ICD-10-CM | POA: Diagnosis not present

## 2023-06-20 DIAGNOSIS — Z170 Estrogen receptor positive status [ER+]: Secondary | ICD-10-CM | POA: Diagnosis not present

## 2023-06-20 LAB — RAD ONC ARIA SESSION SUMMARY
Course Elapsed Days: 16
Plan Fractions Treated to Date: 12
Plan Fractions Treated to Date: 12
Plan Prescribed Dose Per Fraction: 1.8 Gy
Plan Prescribed Dose Per Fraction: 1.8 Gy
Plan Total Fractions Prescribed: 28
Plan Total Fractions Prescribed: 28
Plan Total Prescribed Dose: 50.4 Gy
Plan Total Prescribed Dose: 50.4 Gy
Reference Point Dosage Given to Date: 21.6 Gy
Reference Point Dosage Given to Date: 21.6 Gy
Reference Point Session Dosage Given: 1.8 Gy
Reference Point Session Dosage Given: 1.8 Gy
Session Number: 12

## 2023-06-21 ENCOUNTER — Other Ambulatory Visit: Payer: Self-pay

## 2023-06-21 ENCOUNTER — Ambulatory Visit
Admission: RE | Admit: 2023-06-21 | Discharge: 2023-06-21 | Disposition: A | Discharge status: Home / Self Care | Payer: Medicare HMO | Source: Ambulatory Visit | Attending: Radiation Oncology | Admitting: Radiation Oncology

## 2023-06-24 ENCOUNTER — Ambulatory Visit
Admission: RE | Admit: 2023-06-24 | Discharge: 2023-06-24 | Disposition: A | Discharge status: Home / Self Care | Payer: Medicare HMO | Source: Ambulatory Visit | Attending: Radiation Oncology | Admitting: Radiation Oncology

## 2023-06-24 ENCOUNTER — Other Ambulatory Visit: Payer: Self-pay

## 2023-06-24 DIAGNOSIS — Z510 Encounter for antineoplastic radiation therapy: Secondary | ICD-10-CM | POA: Diagnosis not present

## 2023-06-24 DIAGNOSIS — R9430 Abnormal result of cardiovascular function study, unspecified: Secondary | ICD-10-CM | POA: Diagnosis not present

## 2023-06-24 DIAGNOSIS — Z170 Estrogen receptor positive status [ER+]: Secondary | ICD-10-CM | POA: Diagnosis not present

## 2023-06-24 DIAGNOSIS — C50412 Malignant neoplasm of upper-outer quadrant of left female breast: Secondary | ICD-10-CM | POA: Diagnosis not present

## 2023-06-24 DIAGNOSIS — I10 Essential (primary) hypertension: Secondary | ICD-10-CM | POA: Diagnosis not present

## 2023-06-24 DIAGNOSIS — C50512 Malignant neoplasm of lower-outer quadrant of left female breast: Secondary | ICD-10-CM | POA: Diagnosis not present

## 2023-06-24 DIAGNOSIS — Z87891 Personal history of nicotine dependence: Secondary | ICD-10-CM | POA: Diagnosis not present

## 2023-06-24 DIAGNOSIS — Z9221 Personal history of antineoplastic chemotherapy: Secondary | ICD-10-CM | POA: Diagnosis not present

## 2023-06-24 DIAGNOSIS — Z86718 Personal history of other venous thrombosis and embolism: Secondary | ICD-10-CM | POA: Diagnosis not present

## 2023-06-24 LAB — RAD ONC ARIA SESSION SUMMARY
Course Elapsed Days: 20
Plan Fractions Treated to Date: 13
Plan Fractions Treated to Date: 13
Plan Prescribed Dose Per Fraction: 1.8 Gy
Plan Prescribed Dose Per Fraction: 1.8 Gy
Plan Total Fractions Prescribed: 28
Plan Total Fractions Prescribed: 28
Plan Total Prescribed Dose: 50.4 Gy
Plan Total Prescribed Dose: 50.4 Gy
Reference Point Dosage Given to Date: 23.4 Gy
Reference Point Dosage Given to Date: 23.4 Gy
Reference Point Session Dosage Given: 1.8 Gy
Reference Point Session Dosage Given: 1.8 Gy
Session Number: 13

## 2023-06-25 ENCOUNTER — Other Ambulatory Visit: Payer: Self-pay

## 2023-06-25 ENCOUNTER — Ambulatory Visit
Admission: RE | Admit: 2023-06-25 | Discharge: 2023-06-25 | Disposition: A | Discharge status: Home / Self Care | Payer: Medicare HMO | Source: Ambulatory Visit | Attending: Radiation Oncology | Admitting: Radiation Oncology

## 2023-06-25 DIAGNOSIS — Z170 Estrogen receptor positive status [ER+]: Secondary | ICD-10-CM | POA: Diagnosis not present

## 2023-06-25 DIAGNOSIS — C50412 Malignant neoplasm of upper-outer quadrant of left female breast: Secondary | ICD-10-CM | POA: Diagnosis not present

## 2023-06-25 DIAGNOSIS — Z9221 Personal history of antineoplastic chemotherapy: Secondary | ICD-10-CM | POA: Diagnosis not present

## 2023-06-25 DIAGNOSIS — R9430 Abnormal result of cardiovascular function study, unspecified: Secondary | ICD-10-CM | POA: Diagnosis not present

## 2023-06-25 DIAGNOSIS — I10 Essential (primary) hypertension: Secondary | ICD-10-CM | POA: Diagnosis not present

## 2023-06-25 DIAGNOSIS — Z86718 Personal history of other venous thrombosis and embolism: Secondary | ICD-10-CM | POA: Diagnosis not present

## 2023-06-25 DIAGNOSIS — C50512 Malignant neoplasm of lower-outer quadrant of left female breast: Secondary | ICD-10-CM | POA: Diagnosis not present

## 2023-06-25 DIAGNOSIS — Z87891 Personal history of nicotine dependence: Secondary | ICD-10-CM | POA: Diagnosis not present

## 2023-06-25 DIAGNOSIS — Z510 Encounter for antineoplastic radiation therapy: Secondary | ICD-10-CM | POA: Diagnosis not present

## 2023-06-25 LAB — RAD ONC ARIA SESSION SUMMARY
Course Elapsed Days: 21
Plan Fractions Treated to Date: 14
Plan Fractions Treated to Date: 14
Plan Prescribed Dose Per Fraction: 1.8 Gy
Plan Prescribed Dose Per Fraction: 1.8 Gy
Plan Total Fractions Prescribed: 28
Plan Total Fractions Prescribed: 28
Plan Total Prescribed Dose: 50.4 Gy
Plan Total Prescribed Dose: 50.4 Gy
Reference Point Dosage Given to Date: 25.2 Gy
Reference Point Dosage Given to Date: 25.2 Gy
Reference Point Session Dosage Given: 1.8 Gy
Reference Point Session Dosage Given: 1.8 Gy
Session Number: 14

## 2023-06-26 ENCOUNTER — Other Ambulatory Visit: Payer: Self-pay

## 2023-06-26 ENCOUNTER — Ambulatory Visit
Admission: RE | Admit: 2023-06-26 | Discharge: 2023-06-26 | Disposition: A | Discharge status: Home / Self Care | Payer: Medicare HMO | Source: Ambulatory Visit | Attending: Radiation Oncology | Admitting: Radiation Oncology

## 2023-06-26 DIAGNOSIS — Z510 Encounter for antineoplastic radiation therapy: Secondary | ICD-10-CM | POA: Diagnosis not present

## 2023-06-26 DIAGNOSIS — C50412 Malignant neoplasm of upper-outer quadrant of left female breast: Secondary | ICD-10-CM | POA: Diagnosis not present

## 2023-06-26 DIAGNOSIS — Z87891 Personal history of nicotine dependence: Secondary | ICD-10-CM | POA: Diagnosis not present

## 2023-06-26 DIAGNOSIS — Z86718 Personal history of other venous thrombosis and embolism: Secondary | ICD-10-CM | POA: Diagnosis not present

## 2023-06-26 DIAGNOSIS — Z9221 Personal history of antineoplastic chemotherapy: Secondary | ICD-10-CM | POA: Diagnosis not present

## 2023-06-26 DIAGNOSIS — I10 Essential (primary) hypertension: Secondary | ICD-10-CM | POA: Diagnosis not present

## 2023-06-26 DIAGNOSIS — C50512 Malignant neoplasm of lower-outer quadrant of left female breast: Secondary | ICD-10-CM | POA: Diagnosis not present

## 2023-06-26 DIAGNOSIS — R9430 Abnormal result of cardiovascular function study, unspecified: Secondary | ICD-10-CM | POA: Diagnosis not present

## 2023-06-26 DIAGNOSIS — Z170 Estrogen receptor positive status [ER+]: Secondary | ICD-10-CM | POA: Diagnosis not present

## 2023-06-26 LAB — RAD ONC ARIA SESSION SUMMARY
Course Elapsed Days: 22
Plan Fractions Treated to Date: 15
Plan Fractions Treated to Date: 15
Plan Prescribed Dose Per Fraction: 1.8 Gy
Plan Prescribed Dose Per Fraction: 1.8 Gy
Plan Total Fractions Prescribed: 28
Plan Total Fractions Prescribed: 28
Plan Total Prescribed Dose: 50.4 Gy
Plan Total Prescribed Dose: 50.4 Gy
Reference Point Dosage Given to Date: 27 Gy
Reference Point Dosage Given to Date: 27 Gy
Reference Point Session Dosage Given: 1.8 Gy
Reference Point Session Dosage Given: 1.8 Gy
Session Number: 15

## 2023-06-27 ENCOUNTER — Ambulatory Visit
Admission: RE | Admit: 2023-06-27 | Discharge: 2023-06-27 | Disposition: A | Discharge status: Home / Self Care | Payer: Medicare HMO | Source: Ambulatory Visit | Attending: Radiation Oncology | Admitting: Radiation Oncology

## 2023-06-27 ENCOUNTER — Other Ambulatory Visit: Payer: Self-pay

## 2023-06-27 DIAGNOSIS — C50512 Malignant neoplasm of lower-outer quadrant of left female breast: Secondary | ICD-10-CM | POA: Diagnosis not present

## 2023-06-27 DIAGNOSIS — Z510 Encounter for antineoplastic radiation therapy: Secondary | ICD-10-CM | POA: Diagnosis not present

## 2023-06-27 DIAGNOSIS — R9430 Abnormal result of cardiovascular function study, unspecified: Secondary | ICD-10-CM | POA: Diagnosis not present

## 2023-06-27 DIAGNOSIS — Z170 Estrogen receptor positive status [ER+]: Secondary | ICD-10-CM | POA: Diagnosis not present

## 2023-06-27 DIAGNOSIS — I10 Essential (primary) hypertension: Secondary | ICD-10-CM | POA: Diagnosis not present

## 2023-06-27 DIAGNOSIS — Z87891 Personal history of nicotine dependence: Secondary | ICD-10-CM | POA: Diagnosis not present

## 2023-06-27 DIAGNOSIS — Z86718 Personal history of other venous thrombosis and embolism: Secondary | ICD-10-CM | POA: Diagnosis not present

## 2023-06-27 DIAGNOSIS — C50412 Malignant neoplasm of upper-outer quadrant of left female breast: Secondary | ICD-10-CM | POA: Diagnosis not present

## 2023-06-27 DIAGNOSIS — Z9221 Personal history of antineoplastic chemotherapy: Secondary | ICD-10-CM | POA: Diagnosis not present

## 2023-06-27 LAB — RAD ONC ARIA SESSION SUMMARY
Course Elapsed Days: 23
Plan Fractions Treated to Date: 16
Plan Fractions Treated to Date: 16
Plan Prescribed Dose Per Fraction: 1.8 Gy
Plan Prescribed Dose Per Fraction: 1.8 Gy
Plan Total Fractions Prescribed: 28
Plan Total Fractions Prescribed: 28
Plan Total Prescribed Dose: 50.4 Gy
Plan Total Prescribed Dose: 50.4 Gy
Reference Point Dosage Given to Date: 28.8 Gy
Reference Point Dosage Given to Date: 28.8 Gy
Reference Point Session Dosage Given: 1.8 Gy
Reference Point Session Dosage Given: 1.8 Gy
Session Number: 16

## 2023-06-28 ENCOUNTER — Other Ambulatory Visit: Payer: Self-pay

## 2023-06-28 ENCOUNTER — Ambulatory Visit
Admission: RE | Admit: 2023-06-28 | Discharge: 2023-06-28 | Disposition: A | Discharge status: Home / Self Care | Payer: Medicare HMO | Source: Ambulatory Visit | Attending: Radiation Oncology | Admitting: Radiation Oncology

## 2023-06-28 DIAGNOSIS — I10 Essential (primary) hypertension: Secondary | ICD-10-CM | POA: Diagnosis not present

## 2023-06-28 DIAGNOSIS — Z87891 Personal history of nicotine dependence: Secondary | ICD-10-CM | POA: Diagnosis not present

## 2023-06-28 DIAGNOSIS — Z86718 Personal history of other venous thrombosis and embolism: Secondary | ICD-10-CM | POA: Diagnosis not present

## 2023-06-28 DIAGNOSIS — Z9221 Personal history of antineoplastic chemotherapy: Secondary | ICD-10-CM | POA: Diagnosis not present

## 2023-06-28 DIAGNOSIS — Z170 Estrogen receptor positive status [ER+]: Secondary | ICD-10-CM | POA: Diagnosis not present

## 2023-06-28 DIAGNOSIS — C50512 Malignant neoplasm of lower-outer quadrant of left female breast: Secondary | ICD-10-CM | POA: Diagnosis not present

## 2023-06-28 DIAGNOSIS — C50412 Malignant neoplasm of upper-outer quadrant of left female breast: Secondary | ICD-10-CM | POA: Diagnosis not present

## 2023-06-28 DIAGNOSIS — Z510 Encounter for antineoplastic radiation therapy: Secondary | ICD-10-CM | POA: Diagnosis not present

## 2023-06-28 DIAGNOSIS — R9430 Abnormal result of cardiovascular function study, unspecified: Secondary | ICD-10-CM | POA: Diagnosis not present

## 2023-06-28 LAB — RAD ONC ARIA SESSION SUMMARY
Course Elapsed Days: 24
Plan Fractions Treated to Date: 17
Plan Fractions Treated to Date: 17
Plan Prescribed Dose Per Fraction: 1.8 Gy
Plan Prescribed Dose Per Fraction: 1.8 Gy
Plan Total Fractions Prescribed: 28
Plan Total Fractions Prescribed: 28
Plan Total Prescribed Dose: 50.4 Gy
Plan Total Prescribed Dose: 50.4 Gy
Reference Point Dosage Given to Date: 30.6 Gy
Reference Point Dosage Given to Date: 30.6 Gy
Reference Point Session Dosage Given: 1.8 Gy
Reference Point Session Dosage Given: 1.8 Gy
Session Number: 17

## 2023-07-01 ENCOUNTER — Other Ambulatory Visit: Payer: Self-pay

## 2023-07-01 ENCOUNTER — Ambulatory Visit
Admission: RE | Admit: 2023-07-01 | Discharge: 2023-07-01 | Disposition: A | Discharge status: Home / Self Care | Payer: Medicare HMO | Source: Ambulatory Visit | Attending: Radiation Oncology | Admitting: Radiation Oncology

## 2023-07-01 ENCOUNTER — Ambulatory Visit (HOSPITAL_BASED_OUTPATIENT_CLINIC_OR_DEPARTMENT_OTHER)
Admission: RE | Admit: 2023-07-01 | Discharge: 2023-07-01 | Disposition: A | Discharge status: Home / Self Care | Payer: Medicare HMO | Source: Ambulatory Visit | Attending: Hematology and Oncology | Admitting: Hematology and Oncology

## 2023-07-01 DIAGNOSIS — C50412 Malignant neoplasm of upper-outer quadrant of left female breast: Secondary | ICD-10-CM | POA: Diagnosis not present

## 2023-07-01 DIAGNOSIS — C50512 Malignant neoplasm of lower-outer quadrant of left female breast: Secondary | ICD-10-CM

## 2023-07-01 DIAGNOSIS — Z170 Estrogen receptor positive status [ER+]: Secondary | ICD-10-CM | POA: Diagnosis not present

## 2023-07-01 DIAGNOSIS — Z86718 Personal history of other venous thrombosis and embolism: Secondary | ICD-10-CM | POA: Insufficient documentation

## 2023-07-01 DIAGNOSIS — C50919 Malignant neoplasm of unspecified site of unspecified female breast: Secondary | ICD-10-CM | POA: Insufficient documentation

## 2023-07-01 DIAGNOSIS — R9430 Abnormal result of cardiovascular function study, unspecified: Secondary | ICD-10-CM | POA: Diagnosis not present

## 2023-07-01 DIAGNOSIS — I427 Cardiomyopathy due to drug and external agent: Secondary | ICD-10-CM

## 2023-07-01 DIAGNOSIS — I10 Essential (primary) hypertension: Secondary | ICD-10-CM | POA: Insufficient documentation

## 2023-07-01 DIAGNOSIS — Z87891 Personal history of nicotine dependence: Secondary | ICD-10-CM | POA: Insufficient documentation

## 2023-07-01 DIAGNOSIS — Z510 Encounter for antineoplastic radiation therapy: Secondary | ICD-10-CM | POA: Diagnosis not present

## 2023-07-01 DIAGNOSIS — Z0189 Encounter for other specified special examinations: Secondary | ICD-10-CM

## 2023-07-01 DIAGNOSIS — Z85048 Personal history of other malignant neoplasm of rectum, rectosigmoid junction, and anus: Secondary | ICD-10-CM

## 2023-07-01 DIAGNOSIS — Z9221 Personal history of antineoplastic chemotherapy: Secondary | ICD-10-CM | POA: Diagnosis not present

## 2023-07-01 LAB — RAD ONC ARIA SESSION SUMMARY
Course Elapsed Days: 27
Plan Fractions Treated to Date: 18
Plan Fractions Treated to Date: 18
Plan Prescribed Dose Per Fraction: 1.8 Gy
Plan Prescribed Dose Per Fraction: 1.8 Gy
Plan Total Fractions Prescribed: 28
Plan Total Fractions Prescribed: 28
Plan Total Prescribed Dose: 50.4 Gy
Plan Total Prescribed Dose: 50.4 Gy
Reference Point Dosage Given to Date: 32.4 Gy
Reference Point Dosage Given to Date: 32.4 Gy
Reference Point Session Dosage Given: 1.8 Gy
Reference Point Session Dosage Given: 1.8 Gy
Session Number: 18

## 2023-07-01 LAB — ECHOCARDIOGRAM COMPLETE
AR max vel: 2.18 cm2
AV Area VTI: 2.13 cm2
AV Area mean vel: 2.07 cm2
AV Mean grad: 5.5 mmHg
AV Peak grad: 8.8 mmHg
Ao pk vel: 1.48 m/s
Area-P 1/2: 3.65 cm2
Calc EF: 51.4 %
MV VTI: 2.81 cm2
S' Lateral: 2.6 cm
Single Plane A2C EF: 50.2 %
Single Plane A4C EF: 51.8 %

## 2023-07-02 ENCOUNTER — Other Ambulatory Visit: Payer: Self-pay

## 2023-07-02 ENCOUNTER — Ambulatory Visit
Admission: RE | Admit: 2023-07-02 | Discharge: 2023-07-02 | Disposition: A | Discharge status: Home / Self Care | Payer: Medicare HMO | Source: Ambulatory Visit | Attending: Radiation Oncology | Admitting: Radiation Oncology

## 2023-07-02 DIAGNOSIS — R9430 Abnormal result of cardiovascular function study, unspecified: Secondary | ICD-10-CM | POA: Diagnosis not present

## 2023-07-02 DIAGNOSIS — Z170 Estrogen receptor positive status [ER+]: Secondary | ICD-10-CM | POA: Diagnosis not present

## 2023-07-02 DIAGNOSIS — C50412 Malignant neoplasm of upper-outer quadrant of left female breast: Secondary | ICD-10-CM | POA: Diagnosis not present

## 2023-07-02 DIAGNOSIS — Z510 Encounter for antineoplastic radiation therapy: Secondary | ICD-10-CM | POA: Diagnosis not present

## 2023-07-02 DIAGNOSIS — Z87891 Personal history of nicotine dependence: Secondary | ICD-10-CM | POA: Diagnosis not present

## 2023-07-02 DIAGNOSIS — I10 Essential (primary) hypertension: Secondary | ICD-10-CM | POA: Diagnosis not present

## 2023-07-02 DIAGNOSIS — C50512 Malignant neoplasm of lower-outer quadrant of left female breast: Secondary | ICD-10-CM | POA: Diagnosis not present

## 2023-07-02 DIAGNOSIS — Z86718 Personal history of other venous thrombosis and embolism: Secondary | ICD-10-CM | POA: Diagnosis not present

## 2023-07-02 DIAGNOSIS — Z9221 Personal history of antineoplastic chemotherapy: Secondary | ICD-10-CM | POA: Diagnosis not present

## 2023-07-02 LAB — RAD ONC ARIA SESSION SUMMARY
Course Elapsed Days: 28
Plan Fractions Treated to Date: 19
Plan Fractions Treated to Date: 19
Plan Prescribed Dose Per Fraction: 1.8 Gy
Plan Prescribed Dose Per Fraction: 1.8 Gy
Plan Total Fractions Prescribed: 28
Plan Total Fractions Prescribed: 28
Plan Total Prescribed Dose: 50.4 Gy
Plan Total Prescribed Dose: 50.4 Gy
Reference Point Dosage Given to Date: 34.2 Gy
Reference Point Dosage Given to Date: 34.2 Gy
Reference Point Session Dosage Given: 1.8 Gy
Reference Point Session Dosage Given: 1.8 Gy
Session Number: 19

## 2023-07-03 ENCOUNTER — Ambulatory Visit
Admission: RE | Admit: 2023-07-03 | Discharge: 2023-07-03 | Disposition: A | Discharge status: Home / Self Care | Payer: Medicare HMO | Source: Ambulatory Visit | Attending: Radiation Oncology | Admitting: Radiation Oncology

## 2023-07-03 ENCOUNTER — Other Ambulatory Visit: Payer: Self-pay

## 2023-07-03 DIAGNOSIS — R9430 Abnormal result of cardiovascular function study, unspecified: Secondary | ICD-10-CM | POA: Diagnosis not present

## 2023-07-03 DIAGNOSIS — Z9221 Personal history of antineoplastic chemotherapy: Secondary | ICD-10-CM | POA: Diagnosis not present

## 2023-07-03 DIAGNOSIS — I10 Essential (primary) hypertension: Secondary | ICD-10-CM | POA: Diagnosis not present

## 2023-07-03 DIAGNOSIS — C50412 Malignant neoplasm of upper-outer quadrant of left female breast: Secondary | ICD-10-CM | POA: Diagnosis not present

## 2023-07-03 DIAGNOSIS — Z87891 Personal history of nicotine dependence: Secondary | ICD-10-CM | POA: Diagnosis not present

## 2023-07-03 DIAGNOSIS — Z86718 Personal history of other venous thrombosis and embolism: Secondary | ICD-10-CM | POA: Diagnosis not present

## 2023-07-03 DIAGNOSIS — Z170 Estrogen receptor positive status [ER+]: Secondary | ICD-10-CM | POA: Diagnosis not present

## 2023-07-03 DIAGNOSIS — C50512 Malignant neoplasm of lower-outer quadrant of left female breast: Secondary | ICD-10-CM | POA: Diagnosis not present

## 2023-07-03 DIAGNOSIS — Z510 Encounter for antineoplastic radiation therapy: Secondary | ICD-10-CM | POA: Diagnosis not present

## 2023-07-03 LAB — RAD ONC ARIA SESSION SUMMARY
Course Elapsed Days: 29
Plan Fractions Treated to Date: 20
Plan Fractions Treated to Date: 20
Plan Prescribed Dose Per Fraction: 1.8 Gy
Plan Prescribed Dose Per Fraction: 1.8 Gy
Plan Total Fractions Prescribed: 28
Plan Total Fractions Prescribed: 28
Plan Total Prescribed Dose: 50.4 Gy
Plan Total Prescribed Dose: 50.4 Gy
Reference Point Dosage Given to Date: 36 Gy
Reference Point Dosage Given to Date: 36 Gy
Reference Point Session Dosage Given: 1.8 Gy
Reference Point Session Dosage Given: 1.8 Gy
Session Number: 20

## 2023-07-04 ENCOUNTER — Ambulatory Visit
Admission: RE | Admit: 2023-07-04 | Discharge: 2023-07-04 | Disposition: A | Discharge status: Home / Self Care | Payer: Medicare HMO | Source: Ambulatory Visit | Attending: Radiation Oncology | Admitting: Radiation Oncology

## 2023-07-04 ENCOUNTER — Other Ambulatory Visit: Payer: Self-pay

## 2023-07-04 ENCOUNTER — Inpatient Hospital Stay: Payer: Medicare HMO | Attending: Hematology and Oncology

## 2023-07-04 VITALS — BP 129/74 | HR 72 | Resp 18 | Ht 63.0 in | Wt 182.5 lb

## 2023-07-04 DIAGNOSIS — Z170 Estrogen receptor positive status [ER+]: Secondary | ICD-10-CM | POA: Diagnosis not present

## 2023-07-04 DIAGNOSIS — Z79899 Other long term (current) drug therapy: Secondary | ICD-10-CM | POA: Diagnosis not present

## 2023-07-04 DIAGNOSIS — C20 Malignant neoplasm of rectum: Secondary | ICD-10-CM | POA: Insufficient documentation

## 2023-07-04 DIAGNOSIS — Z86718 Personal history of other venous thrombosis and embolism: Secondary | ICD-10-CM | POA: Diagnosis not present

## 2023-07-04 DIAGNOSIS — Z85048 Personal history of other malignant neoplasm of rectum, rectosigmoid junction, and anus: Secondary | ICD-10-CM | POA: Diagnosis not present

## 2023-07-04 DIAGNOSIS — Z923 Personal history of irradiation: Secondary | ICD-10-CM | POA: Diagnosis not present

## 2023-07-04 DIAGNOSIS — Z5111 Encounter for antineoplastic chemotherapy: Secondary | ICD-10-CM | POA: Diagnosis not present

## 2023-07-04 DIAGNOSIS — C50512 Malignant neoplasm of lower-outer quadrant of left female breast: Secondary | ICD-10-CM | POA: Insufficient documentation

## 2023-07-04 DIAGNOSIS — C50412 Malignant neoplasm of upper-outer quadrant of left female breast: Secondary | ICD-10-CM | POA: Insufficient documentation

## 2023-07-04 LAB — RAD ONC ARIA SESSION SUMMARY
Course Elapsed Days: 30
Plan Fractions Treated to Date: 21
Plan Fractions Treated to Date: 21
Plan Prescribed Dose Per Fraction: 1.8 Gy
Plan Prescribed Dose Per Fraction: 1.8 Gy
Plan Total Fractions Prescribed: 28
Plan Total Fractions Prescribed: 28
Plan Total Prescribed Dose: 50.4 Gy
Plan Total Prescribed Dose: 50.4 Gy
Reference Point Dosage Given to Date: 37.8 Gy
Reference Point Dosage Given to Date: 37.8 Gy
Reference Point Session Dosage Given: 1.8 Gy
Reference Point Session Dosage Given: 1.8 Gy
Session Number: 21

## 2023-07-04 MED ORDER — DIPHENHYDRAMINE HCL 25 MG PO CAPS
50.0000 mg | ORAL_CAPSULE | Freq: Once | ORAL | Status: AC
  Administered 2023-07-04: 50 mg via ORAL
  Filled 2023-07-04: qty 2

## 2023-07-04 MED ORDER — SODIUM CHLORIDE 0.9 % IV SOLN: Freq: Once | INTRAVENOUS | Status: AC

## 2023-07-04 MED ORDER — TRASTUZUMAB-ANNS CHEMO 150 MG IV SOLR
6.0000 mg/kg | Freq: Once | INTRAVENOUS | Status: AC
  Administered 2023-07-04: 525 mg via INTRAVENOUS
  Filled 2023-07-04: qty 25

## 2023-07-04 MED ORDER — HEPARIN SOD (PORK) LOCK FLUSH 100 UNIT/ML IV SOLN
500.0000 [IU] | Freq: Once | INTRAVENOUS | Status: AC | PRN
  Administered 2023-07-04: 500 [IU]

## 2023-07-04 MED ORDER — ACETAMINOPHEN 325 MG PO TABS
650.0000 mg | ORAL_TABLET | Freq: Once | ORAL | Status: AC
  Administered 2023-07-04: 650 mg via ORAL
  Filled 2023-07-04: qty 2

## 2023-07-04 MED ORDER — SODIUM CHLORIDE 0.9% FLUSH
10.0000 mL | INTRAVENOUS | Status: DC | PRN
  Administered 2023-07-04: 10 mL

## 2023-07-04 NOTE — Patient Instructions (Signed)

## 2023-07-05 ENCOUNTER — Other Ambulatory Visit: Payer: Self-pay

## 2023-07-05 ENCOUNTER — Ambulatory Visit
Admission: RE | Admit: 2023-07-05 | Discharge: 2023-07-05 | Disposition: A | Discharge status: Home / Self Care | Payer: Medicare HMO | Source: Ambulatory Visit | Attending: Radiation Oncology | Admitting: Radiation Oncology

## 2023-07-05 DIAGNOSIS — F423 Hoarding disorder: Secondary | ICD-10-CM | POA: Diagnosis not present

## 2023-07-05 DIAGNOSIS — Z510 Encounter for antineoplastic radiation therapy: Secondary | ICD-10-CM | POA: Diagnosis not present

## 2023-07-05 DIAGNOSIS — E1169 Type 2 diabetes mellitus with other specified complication: Secondary | ICD-10-CM | POA: Diagnosis not present

## 2023-07-05 DIAGNOSIS — C50412 Malignant neoplasm of upper-outer quadrant of left female breast: Secondary | ICD-10-CM | POA: Diagnosis not present

## 2023-07-05 DIAGNOSIS — I82621 Acute embolism and thrombosis of deep veins of right upper extremity: Secondary | ICD-10-CM | POA: Diagnosis not present

## 2023-07-05 DIAGNOSIS — C20 Malignant neoplasm of rectum: Secondary | ICD-10-CM | POA: Diagnosis not present

## 2023-07-05 DIAGNOSIS — C50512 Malignant neoplasm of lower-outer quadrant of left female breast: Secondary | ICD-10-CM | POA: Diagnosis not present

## 2023-07-05 DIAGNOSIS — Z170 Estrogen receptor positive status [ER+]: Secondary | ICD-10-CM | POA: Diagnosis not present

## 2023-07-05 DIAGNOSIS — Z0001 Encounter for general adult medical examination with abnormal findings: Secondary | ICD-10-CM | POA: Diagnosis not present

## 2023-07-05 DIAGNOSIS — K2100 Gastro-esophageal reflux disease with esophagitis, without bleeding: Secondary | ICD-10-CM | POA: Diagnosis not present

## 2023-07-05 DIAGNOSIS — I10 Essential (primary) hypertension: Secondary | ICD-10-CM | POA: Diagnosis not present

## 2023-07-05 LAB — RAD ONC ARIA SESSION SUMMARY
Course Elapsed Days: 31
Plan Fractions Treated to Date: 22
Plan Fractions Treated to Date: 22
Plan Prescribed Dose Per Fraction: 1.8 Gy
Plan Prescribed Dose Per Fraction: 1.8 Gy
Plan Total Fractions Prescribed: 28
Plan Total Fractions Prescribed: 28
Plan Total Prescribed Dose: 50.4 Gy
Plan Total Prescribed Dose: 50.4 Gy
Reference Point Dosage Given to Date: 39.6 Gy
Reference Point Dosage Given to Date: 39.6 Gy
Reference Point Session Dosage Given: 1.8 Gy
Reference Point Session Dosage Given: 1.8 Gy
Session Number: 22

## 2023-07-08 ENCOUNTER — Ambulatory Visit
Admission: RE | Admit: 2023-07-08 | Discharge: 2023-07-08 | Disposition: A | Discharge status: Home / Self Care | Payer: Medicare HMO | Source: Ambulatory Visit | Attending: Radiation Oncology | Admitting: Radiation Oncology

## 2023-07-08 ENCOUNTER — Other Ambulatory Visit: Payer: Self-pay

## 2023-07-08 ENCOUNTER — Ambulatory Visit: Admission: RE | Admit: 2023-07-08 | Payer: Medicare HMO | Source: Ambulatory Visit

## 2023-07-08 DIAGNOSIS — C20 Malignant neoplasm of rectum: Secondary | ICD-10-CM

## 2023-07-08 DIAGNOSIS — Z170 Estrogen receptor positive status [ER+]: Secondary | ICD-10-CM | POA: Diagnosis not present

## 2023-07-08 DIAGNOSIS — C50512 Malignant neoplasm of lower-outer quadrant of left female breast: Secondary | ICD-10-CM

## 2023-07-08 DIAGNOSIS — C50412 Malignant neoplasm of upper-outer quadrant of left female breast: Secondary | ICD-10-CM | POA: Diagnosis not present

## 2023-07-08 DIAGNOSIS — Z510 Encounter for antineoplastic radiation therapy: Secondary | ICD-10-CM | POA: Diagnosis not present

## 2023-07-08 LAB — RAD ONC ARIA SESSION SUMMARY
Course Elapsed Days: 34
Plan Fractions Treated to Date: 23
Plan Fractions Treated to Date: 23
Plan Prescribed Dose Per Fraction: 1.8 Gy
Plan Prescribed Dose Per Fraction: 1.8 Gy
Plan Total Fractions Prescribed: 28
Plan Total Fractions Prescribed: 28
Plan Total Prescribed Dose: 50.4 Gy
Plan Total Prescribed Dose: 50.4 Gy
Reference Point Dosage Given to Date: 41.4 Gy
Reference Point Dosage Given to Date: 41.4 Gy
Reference Point Session Dosage Given: 1.8 Gy
Reference Point Session Dosage Given: 1.8 Gy
Session Number: 23

## 2023-07-08 MED ORDER — RADIAPLEXRX EX GEL: Freq: Once | CUTANEOUS | Status: AC

## 2023-07-09 ENCOUNTER — Other Ambulatory Visit: Payer: Self-pay

## 2023-07-09 ENCOUNTER — Ambulatory Visit: Payer: Medicare HMO

## 2023-07-09 ENCOUNTER — Ambulatory Visit: Admission: RE | Admit: 2023-07-09 | Payer: Medicare HMO | Source: Ambulatory Visit

## 2023-07-09 DIAGNOSIS — C50412 Malignant neoplasm of upper-outer quadrant of left female breast: Secondary | ICD-10-CM | POA: Diagnosis not present

## 2023-07-09 DIAGNOSIS — Z170 Estrogen receptor positive status [ER+]: Secondary | ICD-10-CM | POA: Diagnosis not present

## 2023-07-09 DIAGNOSIS — C20 Malignant neoplasm of rectum: Secondary | ICD-10-CM | POA: Diagnosis not present

## 2023-07-09 DIAGNOSIS — C50512 Malignant neoplasm of lower-outer quadrant of left female breast: Secondary | ICD-10-CM | POA: Diagnosis not present

## 2023-07-09 DIAGNOSIS — Z510 Encounter for antineoplastic radiation therapy: Secondary | ICD-10-CM | POA: Diagnosis not present

## 2023-07-09 LAB — RAD ONC ARIA SESSION SUMMARY
Course Elapsed Days: 35
Plan Fractions Treated to Date: 24
Plan Fractions Treated to Date: 24
Plan Prescribed Dose Per Fraction: 1.8 Gy
Plan Prescribed Dose Per Fraction: 1.8 Gy
Plan Total Fractions Prescribed: 28
Plan Total Fractions Prescribed: 28
Plan Total Prescribed Dose: 50.4 Gy
Plan Total Prescribed Dose: 50.4 Gy
Reference Point Dosage Given to Date: 43.2 Gy
Reference Point Dosage Given to Date: 43.2 Gy
Reference Point Session Dosage Given: 1.8 Gy
Reference Point Session Dosage Given: 1.8 Gy
Session Number: 24

## 2023-07-10 ENCOUNTER — Other Ambulatory Visit: Payer: Self-pay

## 2023-07-10 ENCOUNTER — Ambulatory Visit
Admission: RE | Admit: 2023-07-10 | Discharge: 2023-07-10 | Disposition: A | Discharge status: Home / Self Care | Payer: Medicare HMO | Source: Ambulatory Visit | Attending: Radiation Oncology | Admitting: Radiation Oncology

## 2023-07-10 DIAGNOSIS — C50412 Malignant neoplasm of upper-outer quadrant of left female breast: Secondary | ICD-10-CM | POA: Diagnosis not present

## 2023-07-10 DIAGNOSIS — Z510 Encounter for antineoplastic radiation therapy: Secondary | ICD-10-CM | POA: Diagnosis not present

## 2023-07-10 DIAGNOSIS — Z170 Estrogen receptor positive status [ER+]: Secondary | ICD-10-CM | POA: Diagnosis not present

## 2023-07-10 DIAGNOSIS — C50512 Malignant neoplasm of lower-outer quadrant of left female breast: Secondary | ICD-10-CM | POA: Diagnosis not present

## 2023-07-10 DIAGNOSIS — C20 Malignant neoplasm of rectum: Secondary | ICD-10-CM | POA: Diagnosis not present

## 2023-07-10 LAB — RAD ONC ARIA SESSION SUMMARY
Course Elapsed Days: 36
Plan Fractions Treated to Date: 25
Plan Fractions Treated to Date: 25
Plan Prescribed Dose Per Fraction: 1.8 Gy
Plan Prescribed Dose Per Fraction: 1.8 Gy
Plan Total Fractions Prescribed: 28
Plan Total Fractions Prescribed: 28
Plan Total Prescribed Dose: 50.4 Gy
Plan Total Prescribed Dose: 50.4 Gy
Reference Point Dosage Given to Date: 45 Gy
Reference Point Dosage Given to Date: 45 Gy
Reference Point Session Dosage Given: 1.8 Gy
Reference Point Session Dosage Given: 1.8 Gy
Session Number: 25

## 2023-07-11 ENCOUNTER — Ambulatory Visit
Admission: RE | Admit: 2023-07-11 | Discharge: 2023-07-11 | Disposition: A | Discharge status: Home / Self Care | Payer: Medicare HMO | Source: Ambulatory Visit | Attending: Radiation Oncology | Admitting: Radiation Oncology

## 2023-07-11 ENCOUNTER — Other Ambulatory Visit: Payer: Self-pay

## 2023-07-11 DIAGNOSIS — C50512 Malignant neoplasm of lower-outer quadrant of left female breast: Secondary | ICD-10-CM | POA: Diagnosis not present

## 2023-07-11 DIAGNOSIS — Z170 Estrogen receptor positive status [ER+]: Secondary | ICD-10-CM | POA: Diagnosis not present

## 2023-07-11 DIAGNOSIS — C50412 Malignant neoplasm of upper-outer quadrant of left female breast: Secondary | ICD-10-CM | POA: Diagnosis not present

## 2023-07-11 DIAGNOSIS — Z510 Encounter for antineoplastic radiation therapy: Secondary | ICD-10-CM | POA: Diagnosis not present

## 2023-07-11 DIAGNOSIS — C20 Malignant neoplasm of rectum: Secondary | ICD-10-CM | POA: Diagnosis not present

## 2023-07-11 LAB — RAD ONC ARIA SESSION SUMMARY
Course Elapsed Days: 37
Plan Fractions Treated to Date: 26
Plan Fractions Treated to Date: 26
Plan Prescribed Dose Per Fraction: 1.8 Gy
Plan Prescribed Dose Per Fraction: 1.8 Gy
Plan Total Fractions Prescribed: 28
Plan Total Fractions Prescribed: 28
Plan Total Prescribed Dose: 50.4 Gy
Plan Total Prescribed Dose: 50.4 Gy
Reference Point Dosage Given to Date: 46.8 Gy
Reference Point Dosage Given to Date: 46.8 Gy
Reference Point Session Dosage Given: 1.8 Gy
Reference Point Session Dosage Given: 1.8 Gy
Session Number: 26

## 2023-07-12 ENCOUNTER — Other Ambulatory Visit: Payer: Self-pay

## 2023-07-12 ENCOUNTER — Ambulatory Visit
Admission: RE | Admit: 2023-07-12 | Discharge: 2023-07-12 | Disposition: A | Discharge status: Home / Self Care | Payer: Medicare HMO | Source: Ambulatory Visit | Attending: Radiation Oncology | Admitting: Radiation Oncology

## 2023-07-12 DIAGNOSIS — Z510 Encounter for antineoplastic radiation therapy: Secondary | ICD-10-CM | POA: Diagnosis not present

## 2023-07-12 DIAGNOSIS — C50412 Malignant neoplasm of upper-outer quadrant of left female breast: Secondary | ICD-10-CM | POA: Diagnosis not present

## 2023-07-12 DIAGNOSIS — Z170 Estrogen receptor positive status [ER+]: Secondary | ICD-10-CM | POA: Diagnosis not present

## 2023-07-12 DIAGNOSIS — C20 Malignant neoplasm of rectum: Secondary | ICD-10-CM | POA: Diagnosis not present

## 2023-07-12 DIAGNOSIS — C50512 Malignant neoplasm of lower-outer quadrant of left female breast: Secondary | ICD-10-CM | POA: Diagnosis not present

## 2023-07-12 LAB — RAD ONC ARIA SESSION SUMMARY
Course Elapsed Days: 38
Plan Fractions Treated to Date: 27
Plan Fractions Treated to Date: 27
Plan Prescribed Dose Per Fraction: 1.8 Gy
Plan Prescribed Dose Per Fraction: 1.8 Gy
Plan Total Fractions Prescribed: 28
Plan Total Fractions Prescribed: 28
Plan Total Prescribed Dose: 50.4 Gy
Plan Total Prescribed Dose: 50.4 Gy
Reference Point Dosage Given to Date: 48.6 Gy
Reference Point Dosage Given to Date: 48.6 Gy
Reference Point Session Dosage Given: 1.8 Gy
Reference Point Session Dosage Given: 1.8 Gy
Session Number: 27

## 2023-07-15 ENCOUNTER — Other Ambulatory Visit: Payer: Self-pay

## 2023-07-15 ENCOUNTER — Ambulatory Visit
Admission: RE | Admit: 2023-07-15 | Discharge: 2023-07-15 | Disposition: A | Discharge status: Home / Self Care | Payer: Medicare HMO | Source: Ambulatory Visit | Attending: Radiation Oncology | Admitting: Radiation Oncology

## 2023-07-15 ENCOUNTER — Ambulatory Visit: Payer: Medicare HMO

## 2023-07-15 DIAGNOSIS — Z510 Encounter for antineoplastic radiation therapy: Secondary | ICD-10-CM | POA: Diagnosis not present

## 2023-07-15 DIAGNOSIS — Z170 Estrogen receptor positive status [ER+]: Secondary | ICD-10-CM | POA: Diagnosis not present

## 2023-07-15 DIAGNOSIS — C50512 Malignant neoplasm of lower-outer quadrant of left female breast: Secondary | ICD-10-CM | POA: Diagnosis not present

## 2023-07-15 DIAGNOSIS — C50412 Malignant neoplasm of upper-outer quadrant of left female breast: Secondary | ICD-10-CM | POA: Diagnosis not present

## 2023-07-15 DIAGNOSIS — C20 Malignant neoplasm of rectum: Secondary | ICD-10-CM | POA: Diagnosis not present

## 2023-07-15 LAB — RAD ONC ARIA SESSION SUMMARY
Course Elapsed Days: 41
Plan Fractions Treated to Date: 28
Plan Fractions Treated to Date: 28
Plan Prescribed Dose Per Fraction: 1.8 Gy
Plan Prescribed Dose Per Fraction: 1.8 Gy
Plan Total Fractions Prescribed: 28
Plan Total Fractions Prescribed: 28
Plan Total Prescribed Dose: 50.4 Gy
Plan Total Prescribed Dose: 50.4 Gy
Reference Point Dosage Given to Date: 50.4 Gy
Reference Point Dosage Given to Date: 50.4 Gy
Reference Point Session Dosage Given: 1.8 Gy
Reference Point Session Dosage Given: 1.8 Gy
Session Number: 28

## 2023-07-16 ENCOUNTER — Other Ambulatory Visit: Payer: Self-pay

## 2023-07-16 ENCOUNTER — Ambulatory Visit
Admission: RE | Admit: 2023-07-16 | Discharge: 2023-07-16 | Disposition: A | Discharge status: Home / Self Care | Payer: Medicare HMO | Source: Ambulatory Visit | Attending: Radiation Oncology | Admitting: Radiation Oncology

## 2023-07-16 DIAGNOSIS — Z170 Estrogen receptor positive status [ER+]: Secondary | ICD-10-CM | POA: Diagnosis not present

## 2023-07-16 DIAGNOSIS — C50512 Malignant neoplasm of lower-outer quadrant of left female breast: Secondary | ICD-10-CM | POA: Diagnosis not present

## 2023-07-16 DIAGNOSIS — C50412 Malignant neoplasm of upper-outer quadrant of left female breast: Secondary | ICD-10-CM | POA: Diagnosis not present

## 2023-07-16 DIAGNOSIS — Z510 Encounter for antineoplastic radiation therapy: Secondary | ICD-10-CM | POA: Diagnosis not present

## 2023-07-16 DIAGNOSIS — C20 Malignant neoplasm of rectum: Secondary | ICD-10-CM | POA: Diagnosis not present

## 2023-07-16 LAB — RAD ONC ARIA SESSION SUMMARY
Course Elapsed Days: 42
Plan Fractions Treated to Date: 1
Plan Prescribed Dose Per Fraction: 2 Gy
Plan Total Fractions Prescribed: 6
Plan Total Prescribed Dose: 12 Gy
Reference Point Dosage Given to Date: 2 Gy
Reference Point Session Dosage Given: 2 Gy
Session Number: 29

## 2023-07-17 ENCOUNTER — Ambulatory Visit: Admission: RE | Admit: 2023-07-17 | Payer: Medicare HMO | Source: Ambulatory Visit

## 2023-07-17 ENCOUNTER — Other Ambulatory Visit: Payer: Self-pay

## 2023-07-17 DIAGNOSIS — Z510 Encounter for antineoplastic radiation therapy: Secondary | ICD-10-CM | POA: Diagnosis not present

## 2023-07-17 DIAGNOSIS — C50412 Malignant neoplasm of upper-outer quadrant of left female breast: Secondary | ICD-10-CM | POA: Diagnosis not present

## 2023-07-17 DIAGNOSIS — C50512 Malignant neoplasm of lower-outer quadrant of left female breast: Secondary | ICD-10-CM | POA: Diagnosis not present

## 2023-07-17 DIAGNOSIS — C20 Malignant neoplasm of rectum: Secondary | ICD-10-CM | POA: Diagnosis not present

## 2023-07-17 DIAGNOSIS — Z170 Estrogen receptor positive status [ER+]: Secondary | ICD-10-CM | POA: Diagnosis not present

## 2023-07-17 LAB — RAD ONC ARIA SESSION SUMMARY
Course Elapsed Days: 43
Plan Fractions Treated to Date: 2
Plan Prescribed Dose Per Fraction: 2 Gy
Plan Total Fractions Prescribed: 6
Plan Total Prescribed Dose: 12 Gy
Reference Point Dosage Given to Date: 4 Gy
Reference Point Session Dosage Given: 2 Gy
Session Number: 30

## 2023-07-18 ENCOUNTER — Other Ambulatory Visit: Payer: Self-pay

## 2023-07-18 ENCOUNTER — Ambulatory Visit
Admission: RE | Admit: 2023-07-18 | Discharge: 2023-07-18 | Disposition: A | Discharge status: Home / Self Care | Payer: Medicare HMO | Source: Ambulatory Visit | Attending: Radiation Oncology | Admitting: Radiation Oncology

## 2023-07-18 DIAGNOSIS — Z170 Estrogen receptor positive status [ER+]: Secondary | ICD-10-CM | POA: Diagnosis not present

## 2023-07-18 DIAGNOSIS — C50512 Malignant neoplasm of lower-outer quadrant of left female breast: Secondary | ICD-10-CM | POA: Diagnosis not present

## 2023-07-18 DIAGNOSIS — C20 Malignant neoplasm of rectum: Secondary | ICD-10-CM | POA: Diagnosis not present

## 2023-07-18 LAB — RAD ONC ARIA SESSION SUMMARY
Course Elapsed Days: 44
Plan Fractions Treated to Date: 3
Plan Prescribed Dose Per Fraction: 2 Gy
Plan Total Fractions Prescribed: 6
Plan Total Prescribed Dose: 12 Gy
Reference Point Dosage Given to Date: 6 Gy
Reference Point Session Dosage Given: 2 Gy
Session Number: 31

## 2023-07-19 ENCOUNTER — Other Ambulatory Visit: Payer: Self-pay

## 2023-07-19 ENCOUNTER — Ambulatory Visit
Admission: RE | Admit: 2023-07-19 | Discharge: 2023-07-19 | Disposition: A | Discharge status: Home / Self Care | Payer: Medicare HMO | Source: Ambulatory Visit | Attending: Radiation Oncology | Admitting: Radiation Oncology

## 2023-07-19 DIAGNOSIS — Z170 Estrogen receptor positive status [ER+]: Secondary | ICD-10-CM | POA: Diagnosis not present

## 2023-07-19 DIAGNOSIS — C20 Malignant neoplasm of rectum: Secondary | ICD-10-CM | POA: Diagnosis not present

## 2023-07-19 DIAGNOSIS — C50512 Malignant neoplasm of lower-outer quadrant of left female breast: Secondary | ICD-10-CM | POA: Diagnosis not present

## 2023-07-19 LAB — RAD ONC ARIA SESSION SUMMARY
Course Elapsed Days: 45
Plan Fractions Treated to Date: 4
Plan Prescribed Dose Per Fraction: 2 Gy
Plan Total Fractions Prescribed: 6
Plan Total Prescribed Dose: 12 Gy
Reference Point Dosage Given to Date: 8 Gy
Reference Point Session Dosage Given: 2 Gy
Session Number: 32

## 2023-07-22 ENCOUNTER — Encounter: Payer: Self-pay | Admitting: *Deleted

## 2023-07-22 ENCOUNTER — Ambulatory Visit
Admission: RE | Admit: 2023-07-22 | Discharge: 2023-07-22 | Disposition: A | Discharge status: Home / Self Care | Payer: Medicare HMO | Source: Ambulatory Visit | Attending: Radiation Oncology | Admitting: Radiation Oncology

## 2023-07-22 ENCOUNTER — Other Ambulatory Visit: Payer: Self-pay

## 2023-07-22 ENCOUNTER — Ambulatory Visit: Admission: RE | Admit: 2023-07-22 | Payer: Medicare HMO | Source: Ambulatory Visit

## 2023-07-22 ENCOUNTER — Ambulatory Visit: Payer: Medicare HMO

## 2023-07-22 DIAGNOSIS — C20 Malignant neoplasm of rectum: Secondary | ICD-10-CM | POA: Diagnosis not present

## 2023-07-22 DIAGNOSIS — C50512 Malignant neoplasm of lower-outer quadrant of left female breast: Secondary | ICD-10-CM | POA: Diagnosis not present

## 2023-07-22 DIAGNOSIS — Z170 Estrogen receptor positive status [ER+]: Secondary | ICD-10-CM | POA: Diagnosis not present

## 2023-07-22 LAB — RAD ONC ARIA SESSION SUMMARY
Course Elapsed Days: 48
Plan Fractions Treated to Date: 5
Plan Prescribed Dose Per Fraction: 2 Gy
Plan Total Fractions Prescribed: 6
Plan Total Prescribed Dose: 12 Gy
Reference Point Dosage Given to Date: 10 Gy
Reference Point Session Dosage Given: 2 Gy
Session Number: 33

## 2023-07-23 ENCOUNTER — Other Ambulatory Visit: Payer: Self-pay

## 2023-07-23 ENCOUNTER — Ambulatory Visit
Admission: RE | Admit: 2023-07-23 | Discharge: 2023-07-23 | Disposition: A | Discharge status: Home / Self Care | Payer: Medicare HMO | Source: Ambulatory Visit | Attending: Radiation Oncology | Admitting: Radiation Oncology

## 2023-07-23 DIAGNOSIS — Z170 Estrogen receptor positive status [ER+]: Secondary | ICD-10-CM | POA: Diagnosis not present

## 2023-07-23 DIAGNOSIS — C20 Malignant neoplasm of rectum: Secondary | ICD-10-CM | POA: Diagnosis not present

## 2023-07-23 DIAGNOSIS — Z510 Encounter for antineoplastic radiation therapy: Secondary | ICD-10-CM | POA: Diagnosis not present

## 2023-07-23 DIAGNOSIS — C50512 Malignant neoplasm of lower-outer quadrant of left female breast: Secondary | ICD-10-CM | POA: Diagnosis not present

## 2023-07-23 DIAGNOSIS — C50412 Malignant neoplasm of upper-outer quadrant of left female breast: Secondary | ICD-10-CM | POA: Diagnosis not present

## 2023-07-23 LAB — RAD ONC ARIA SESSION SUMMARY
Course Elapsed Days: 49
Plan Fractions Treated to Date: 6
Plan Prescribed Dose Per Fraction: 2 Gy
Plan Total Fractions Prescribed: 6
Plan Total Prescribed Dose: 12 Gy
Reference Point Dosage Given to Date: 12 Gy
Reference Point Session Dosage Given: 2 Gy
Session Number: 34

## 2023-07-24 NOTE — Radiation Completion Notes (Signed)
Patient Name: Tonya Vincent, Tonya Vincent MRN: [id number] Date of Birth: 30-Sep-1951 Referring Physician: [name], M.D. Date of Service: 2023-07-24 Radiation Oncologist: Arnette Schaumann, M.D. [facility]                             RADIATION ONCOLOGY END OF TREATMENT NOTE     Diagnosis: C50.512 Malignant neoplasm of lower-outer quadrant of left female breast Staging on 2013-12-28: History of rectal cancer T=T2, N=N0, M=cM0 Staging on 2013-12-28: History of rectal cancer T=T2, N=N0, M=M0 Staging on 2022-10-31: Malignant neoplasm of upper-outer quadrant of left breast in female, estrogen receptor positive (HCC) T=cT1a, N=cN0, M=cM0 Intent: Curative     ==========DELIVERED PLANS==========  First Treatment Date: 2023-06-04 - Last Treatment Date: 2023-07-23   Plan Name: Breast_L_BH Site: Breast, Left Technique: 3D Mode: Photon Dose Per Fraction: 1.8 Gy Prescribed Dose (Delivered / Prescribed): 50.4 Gy / 50.4 Gy Prescribed Fxs (Delivered / Prescribed): 28 / 28   Plan Name: Brst_L_Scv_BH Site: Breast, Left Technique: 3D Mode: Photon Dose Per Fraction: 1.8 Gy Prescribed Dose (Delivered / Prescribed): 50.4 Gy / 50.4 Gy Prescribed Fxs (Delivered / Prescribed): 28 / 28   Plan Name: Brst_L_Bst_BH Site: Breast, Left Technique: 3D Mode: Photon Dose Per Fraction: 2 Gy Prescribed Dose (Delivered / Prescribed): 12 Gy / 12 Gy Prescribed Fxs (Delivered / Prescribed): 6 / 6     ==========ON TREATMENT VISIT DATES========== 2023-06-04, 2023-06-17, 2023-06-24, 2023-07-03, 2023-07-08, 2023-07-15, 2023-07-22     ==========UPCOMING VISITS==========       ==========APPENDIX - ON TREATMENT VISIT NOTES==========   See weekly On Treatment Notes in Epic for details.

## 2023-07-25 ENCOUNTER — Inpatient Hospital Stay: Payer: Medicare HMO

## 2023-07-25 ENCOUNTER — Telehealth: Payer: Self-pay | Admitting: *Deleted

## 2023-07-25 ENCOUNTER — Encounter: Payer: Self-pay | Admitting: Adult Health

## 2023-07-25 ENCOUNTER — Other Ambulatory Visit (HOSPITAL_COMMUNITY): Payer: Self-pay | Admitting: Adult Health

## 2023-07-25 ENCOUNTER — Other Ambulatory Visit: Payer: Self-pay

## 2023-07-25 ENCOUNTER — Ambulatory Visit: Payer: Medicare HMO

## 2023-07-25 ENCOUNTER — Inpatient Hospital Stay (HOSPITAL_BASED_OUTPATIENT_CLINIC_OR_DEPARTMENT_OTHER): Payer: Medicare HMO | Admitting: Adult Health

## 2023-07-25 ENCOUNTER — Ambulatory Visit (HOSPITAL_COMMUNITY)
Admission: RE | Admit: 2023-07-25 | Discharge: 2023-07-25 | Disposition: A | Discharge status: Home / Self Care | Payer: Medicare HMO | Source: Ambulatory Visit | Attending: Adult Health | Admitting: Adult Health

## 2023-07-25 VITALS — BP 131/65 | HR 66 | Temp 98.0°F | Resp 16 | Wt 183.2 lb

## 2023-07-25 VITALS — BP 145/65 | HR 65 | Resp 16

## 2023-07-25 DIAGNOSIS — C50412 Malignant neoplasm of upper-outer quadrant of left female breast: Secondary | ICD-10-CM

## 2023-07-25 DIAGNOSIS — R0789 Other chest pain: Secondary | ICD-10-CM | POA: Diagnosis not present

## 2023-07-25 DIAGNOSIS — Z79899 Other long term (current) drug therapy: Secondary | ICD-10-CM | POA: Diagnosis not present

## 2023-07-25 DIAGNOSIS — Z923 Personal history of irradiation: Secondary | ICD-10-CM | POA: Diagnosis not present

## 2023-07-25 DIAGNOSIS — Z170 Estrogen receptor positive status [ER+]: Secondary | ICD-10-CM | POA: Insufficient documentation

## 2023-07-25 DIAGNOSIS — Z86718 Personal history of other venous thrombosis and embolism: Secondary | ICD-10-CM | POA: Diagnosis not present

## 2023-07-25 DIAGNOSIS — Z85048 Personal history of other malignant neoplasm of rectum, rectosigmoid junction, and anus: Secondary | ICD-10-CM | POA: Diagnosis not present

## 2023-07-25 DIAGNOSIS — R079 Chest pain, unspecified: Secondary | ICD-10-CM

## 2023-07-25 DIAGNOSIS — Z95828 Presence of other vascular implants and grafts: Secondary | ICD-10-CM

## 2023-07-25 DIAGNOSIS — C50512 Malignant neoplasm of lower-outer quadrant of left female breast: Secondary | ICD-10-CM | POA: Diagnosis not present

## 2023-07-25 DIAGNOSIS — Z5111 Encounter for antineoplastic chemotherapy: Secondary | ICD-10-CM | POA: Diagnosis not present

## 2023-07-25 LAB — CBC WITH DIFFERENTIAL (CANCER CENTER ONLY)
Abs Immature Granulocytes: 0.01 10*3/uL (ref 0.00–0.07)
Basophils Absolute: 0 10*3/uL (ref 0.0–0.1)
Basophils Relative: 1 %
Eosinophils Absolute: 0.2 10*3/uL (ref 0.0–0.5)
Eosinophils Relative: 6 %
HCT: 33 % — ABNORMAL LOW (ref 36.0–46.0)
Hemoglobin: 10.5 g/dL — ABNORMAL LOW (ref 12.0–15.0)
Immature Granulocytes: 0 %
Lymphocytes Relative: 15 %
Lymphs Abs: 0.5 10*3/uL — ABNORMAL LOW (ref 0.7–4.0)
MCH: 29.3 pg (ref 26.0–34.0)
MCHC: 31.8 g/dL (ref 30.0–36.0)
MCV: 92.2 fL (ref 80.0–100.0)
Monocytes Absolute: 0.4 10*3/uL (ref 0.1–1.0)
Monocytes Relative: 13 %
Neutro Abs: 2.1 10*3/uL (ref 1.7–7.7)
Neutrophils Relative %: 65 %
Platelet Count: 196 10*3/uL (ref 150–400)
RBC: 3.58 MIL/uL — ABNORMAL LOW (ref 3.87–5.11)
RDW: 16.8 % — ABNORMAL HIGH (ref 11.5–15.5)
WBC Count: 3.2 10*3/uL — ABNORMAL LOW (ref 4.0–10.5)
nRBC: 0 % (ref 0.0–0.2)

## 2023-07-25 LAB — CMP (CANCER CENTER ONLY)
ALT: 17 U/L (ref 0–44)
AST: 15 U/L (ref 15–41)
Albumin: 3.7 g/dL (ref 3.5–5.0)
Alkaline Phosphatase: 83 U/L (ref 38–126)
Anion gap: 9 (ref 5–15)
BUN: 19 mg/dL (ref 8–23)
CO2: 24 mmol/L (ref 22–32)
Calcium: 8.9 mg/dL (ref 8.9–10.3)
Chloride: 104 mmol/L (ref 98–111)
Creatinine: 1.16 mg/dL — ABNORMAL HIGH (ref 0.44–1.00)
GFR, Estimated: 51 mL/min — ABNORMAL LOW (ref >60)
Glucose, Bld: 139 mg/dL — ABNORMAL HIGH (ref 70–99)
Potassium: 4.1 mmol/L (ref 3.5–5.1)
Sodium: 137 mmol/L (ref 135–145)
Total Bilirubin: 0.6 mg/dL (ref 0.3–1.2)
Total Protein: 7.2 g/dL (ref 6.5–8.1)

## 2023-07-25 MED ORDER — ACETAMINOPHEN 325 MG PO TABS
650.0000 mg | ORAL_TABLET | Freq: Once | ORAL | Status: AC
  Administered 2023-07-25: 650 mg via ORAL
  Filled 2023-07-25: qty 2

## 2023-07-25 MED ORDER — SODIUM CHLORIDE 0.9% FLUSH
10.0000 mL | INTRAVENOUS | Status: DC | PRN
  Administered 2023-07-25: 10 mL

## 2023-07-25 MED ORDER — ANASTROZOLE 1 MG PO TABS: 1.0000 mg | ORAL_TABLET | Freq: Every day | ORAL | 3 refills | Status: DC

## 2023-07-25 MED ORDER — SODIUM CHLORIDE 0.9 % IV SOLN: Freq: Once | INTRAVENOUS | Status: AC

## 2023-07-25 MED ORDER — DIPHENHYDRAMINE HCL 25 MG PO CAPS
50.0000 mg | ORAL_CAPSULE | Freq: Once | ORAL | Status: AC
  Administered 2023-07-25: 50 mg via ORAL
  Filled 2023-07-25: qty 2

## 2023-07-25 MED ORDER — OMEPRAZOLE 40 MG PO CPDR
40.0000 mg | DELAYED_RELEASE_CAPSULE | Freq: Every day | ORAL | 1 refills | Status: DC
Start: 2023-07-25 — End: 2024-01-22

## 2023-07-25 MED ORDER — SODIUM CHLORIDE 0.9% FLUSH
10.0000 mL | INTRAVENOUS | Status: DC | PRN
  Administered 2023-07-25: 10 mL via INTRAVENOUS

## 2023-07-25 MED ORDER — HEPARIN SOD (PORK) LOCK FLUSH 100 UNIT/ML IV SOLN
500.0000 [IU] | Freq: Once | INTRAVENOUS | Status: AC | PRN
  Administered 2023-07-25: 500 [IU]

## 2023-07-25 MED ORDER — TRASTUZUMAB-ANNS CHEMO 150 MG IV SOLR
6.0000 mg/kg | Freq: Once | INTRAVENOUS | Status: AC
  Administered 2023-07-25: 525 mg via INTRAVENOUS
  Filled 2023-07-25: qty 25

## 2023-07-25 NOTE — Telephone Encounter (Signed)
This RN spoke with pt post xray and review by LCC/DNP - informed her of reading - with current port in right placement.  Per request assessed pt's legs with no reported swelling by pt- of note she was wearing shorts and this RN was able to assess legs- with noted symmetry and normal definition.  Pt will monitor upper right chest pain- which she states as intermittent and let this office know.

## 2023-07-25 NOTE — Assessment & Plan Note (Addendum)
10/17/2022 Diffuse bilateral breast pains.  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 Right chest wall pain: We will obtain stat chest x-ray after her treatment today.  I will see if she can pop back over here once she completes her chest x-ray so we can review the results and discuss next steps.   At risk for heart failure: Her most recent echocardiogram in July was normal and stable.  She will repeat an echocardiogram in October 2024.  Port associated DVT: Improved, taking Xarelto daily with good tolerance  Next steps: I recommended that she start anastrozole as her antiestrogen therapy which will begin in September 2024.  We discussed the mechanism of action, risks, and benefits of the medication and she is agreeable to proceed.  She will return to clinic every 3 weeks for Herceptin, and will see myself or Dr. Pamelia Hoit with every other treatment.

## 2023-07-25 NOTE — Patient Instructions (Signed)
[facility]  Discharge Instructions: Thank you for choosing [facility] to provide your oncology and hematology care.   If you have a lab appointment with the Cancer Center, please go directly to the Cancer Center and check in at the registration area.   Wear comfortable clothing and clothing appropriate for easy access to any Portacath or PICC line.   We strive to give you quality time with your provider. You may need to reschedule your appointment if you arrive late (15 or more minutes).  Arriving late affects you and other patients whose appointments are after yours.  Also, if you miss three or more appointments without notifying the office, you may be dismissed from the clinic at the provider's discretion.      For prescription refill requests, have your pharmacy contact our office and allow 72 hours for refills to be completed.    Today you received the following chemotherapy and/or immunotherapy agents: Kanjinti.      To help prevent nausea and vomiting after your treatment, we encourage you to take your nausea medication as directed.  BELOW ARE SYMPTOMS THAT SHOULD BE REPORTED IMMEDIATELY: *FEVER GREATER THAN 100.4 F (38 C) OR HIGHER *CHILLS OR SWEATING *NAUSEA AND VOMITING THAT IS NOT CONTROLLED WITH YOUR NAUSEA MEDICATION *UNUSUAL SHORTNESS OF BREATH *UNUSUAL BRUISING OR BLEEDING *URINARY PROBLEMS (pain or burning when urinating, or frequent urination) *BOWEL PROBLEMS (unusual diarrhea, constipation, pain near the anus) TENDERNESS IN MOUTH AND THROAT WITH OR WITHOUT PRESENCE OF ULCERS (sore throat, sores in mouth, or a toothache) UNUSUAL RASH, SWELLING OR PAIN  UNUSUAL VAGINAL DISCHARGE OR ITCHING   Items with * indicate a potential emergency and should be followed up as soon as possible or go to the Emergency Department if any problems should occur.  Please show the CHEMOTHERAPY ALERT CARD or IMMUNOTHERAPY ALERT CARD at  check-in to the Emergency Department and triage nurse.  Should you have questions after your visit or need to cancel or reschedule your appointment, please contact [facility]  Dept: [phone]  and follow the prompts.  Office hours are 8:00 a.m. to 4:30 p.m. Monday - Friday. Please note that voicemails left after 4:00 p.m. may not be returned until the following business day.  We are closed weekends and major holidays. You have access to a nurse at all times for urgent questions. Please call the main number to the clinic Dept: [phone] and follow the prompts.   For any non-urgent questions, you may also contact your provider using MyChart. We now offer e-Visits for anyone 18 and older to request care online for non-urgent symptoms. For details visit [email or web address].   Also download the MyChart app! Go to the app store, search "MyChart", open the app, select [facility], and log in with your MyChart username and password.   

## 2023-07-25 NOTE — Patient Instructions (Signed)
Anastrozole Tablets What is this medication? ANASTROZOLE (an AS troe zole) treats breast cancer. It works by decreasing the amount of estrogen hormone your body makes, which slows or stops breast cancer cells from spreading or growing. This medicine may be used for other purposes; ask your health care provider or pharmacist if you have questions. COMMON BRAND NAME(S): Arimidex What should I tell my care team before I take this medication? They need to know if you have any of these conditions: Bone problems Heart disease High cholesterol An unusual or allergic reaction to anastrozole, other medications, foods, dyes, or preservatives Pregnant or trying to get pregnant Breast-feeding How should I use this medication? Take this medication by mouth with a glass of water. Follow the directions on the prescription label. You can take it with or without food. If it upsets your stomach, take it with food. Take your medication at regular intervals. Do not take it more often than directed. Do not stop taking except on your care team's advice. Talk to your care team about the use of this medication in children. Special care may be needed. Overdosage: If you think you have taken too much of this medicine contact a poison control center or emergency room at once. NOTE: This medicine is only for you. Do not share this medicine with others. What if I miss a dose? If you miss a dose, take it as soon as you can. If it is almost time for your next dose, take only that dose. Do not take double or extra doses. What may interact with this medication? Estrogen and progestin hormones Tamoxifen This list may not describe all possible interactions. Give your health care provider a list of all the medicines, herbs, non-prescription drugs, or dietary supplements you use. Also tell them if you smoke, drink alcohol, or use illegal drugs. Some items may interact with your medicine. What should I watch for while using this  medication? Visit your care team for regular checks on your progress. Let your care team know about any unusual vaginal bleeding. Using this medication for a long time may weaken your bones. The risk of bone fractures may be increased. Talk to your care team about your bone health. You should make sure that you get enough calcium and vitamin D while you are taking this medication. Discuss the foods you eat and the vitamins you take with your care team. Talk to your care team if you may be pregnant. Serious birth defects can occur if you take this medication during pregnancy and for 3 weeks after the last dose. You will need a negative pregnancy test before starting this medication. Estrogen and progestin hormones may not work as well while you are taking this medication. Contraception is recommended while taking this medication and for 3 weeks after the last dose. Your care team can help you find the option that works for you. Do not breastfeed while taking this medication and for 2 weeks after the last dose. This medication may cause infertility. Talk with your care team if you are concerned about your fertility. What side effects may I notice from receiving this medication? Side effects that you should report to your care team as soon as possible: Allergic reactions--skin rash, itching, hives, swelling of the face, lips, tongue, or throat Side effects that usually do not require medical attention (report to your care team if they continue or are bothersome): Bone, joint, or muscle pain Headache Hot flashes Nausea Sore throat Swelling of the ankles,  hands, or feet Unusual weakness or fatigue This list may not describe all possible side effects. Call your doctor for medical advice about side effects. You may report side effects to FDA at 1-800-FDA-1088. Where should I keep my medication? Keep out of the reach of children and pets. Store at room temperature between 20 and 25 degrees C (68 and 77  degrees F). Get rid of any unused medication after the expiration date. To get rid of medications that are no longer wanted or have expired: Take the medication to a medication take-back program. Check with your pharmacy or law enforcement to find a location. If you cannot return the medication, check the label or package insert to see if the medication should be thrown out in the garbage or flushed down the toilet. If you are not sure, ask your care team. If it is safe to put it in the trash, empty the medication out of the container. Mix the medication with cat litter, dirt, coffee grounds, or other unwanted substance. Seal the mixture in a bag or container. Put it in the trash. NOTE: This sheet is a summary. It may not cover all possible information. If you have questions about this medicine, talk to your doctor, pharmacist, or health care provider.  2024 [employer]/Gold Standard (2022-04-06 00:00:00)

## 2023-07-25 NOTE — Progress Notes (Signed)
[facility] Cancer Follow up:    Tonya Rakers, MD [address] [place] [address]   DIAGNOSIS:  Cancer Staging  History of rectal cancer Staging form: Colon and Rectum, AJCC 7th Edition - Clinical: Stage I (T2, N0, M0) - Signed by Artis Delay, MD on 12/28/2013 Staged by: Managing physician Diagnostic confirmation: Positive histology Specimen type: Excision Histopathologic type: Adenocarcinoma, NOS - Pathologic: Stage I (T2, N0, cM0) - Signed by Artis Delay, MD on 12/28/2013 Diagnostic confirmation: Positive histology Specimen type: Excision Histopathologic type: Adenocarcinoma, NOS  Malignant neoplasm of upper-outer quadrant of left breast in female, estrogen receptor positive (HCC) Staging form: Breast, AJCC 8th Edition - Clinical: Stage IA (cT1a, cN0, cM0, G2, ER+, PR+, HER2+) - Signed by [name], MD on 10/31/2022 Stage prefix: Initial diagnosis Histologic grading system: 3 grade system   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 after neoadjuvant chemotherapy and radiation therapy   Primary site: Colon and Rectum   Staging method: AJCC 7th Edition   Clinical: Stage I (T2, N0, M0) signed by Artis Delay, MD on 12/28/2013  1:49 PM   Pathologic: Stage I (T2, N0, cM0) signed by Artis Delay, MD on 12/28/2013  1:49 PM   Summary: Stage I (T2, N0, cM0)     History of rectal cancer  11/17/2009 Procedure   Colonoscopy and rectal biopsy confirmed moderately differentiated adenocarcinoma   11/18/2009 Imaging   Transrectal ultrasound place staging T3 lesion   11/18/2009 Imaging   Staging CT scan of the chest, abdomen and pelvis showed multiple lesions in the liver as well as paraspinal mass of unknown etiology   12/05/2009 - 04/03/2010 Chemotherapy   The patient completed new adjuvant chemotherapy with 5-FU and radiation therapy   12/14/2009 Imaging   PET CT scan show faint  metabolic activity in the paraspinal mass with no activity in the liver   04/24/2010 Surgery   She had surgical resection with negative margins. Final pathology was T2, N0, M0 (down staged by chemoradiation therapy from T3, N0, M0)   11/19/2012 Imaging   MRI of the liver confirmed benign hemangioma   01/08/2014 Procedure   Colonoscopy and biopsy was negative   12/21/2014 Imaging   CT scan showed no recurrence of colon cancer. Incidentally, paraspinal mass is slightly larger.   10/04/2016 Procedure   She had repeat colonoscopy which showed two 3 mm polyps in the transverse colon, removed with a cold snare. Resected and retrieved. The examination was otherwise normal on direct and retroflexion views.   10/04/2016 Pathology Results   Surgical [P], transverse, polyp(s) - SERRATED POLYP WITH FOCAL FEATURES SUGGESTIVE OF SESSILE SERRATED POLYP/ADENOMA, ONE FRAGMENT. - HYPERPLASTIC POLYP WITHOUT DYSPLASIA, ONE FRAGMENT. - BENIGN COLORECTAL MUCOSA WITHOUT DYSPLASIA, ONE FRAGMENT. - SEE COMMENT. Microscopic Comment After routine specimen processing, three fragments are identified. One of the fragments demonstrates a serrated horizontally situated crypt, which is suggestive of superficial sampling of a sessile serrated polyp/adenoma (the differential includes another fragment of hyperplastic polyp). Although this is the case, the finding is very focal and not definitive.    11/09/2022 Genetic Testing   Negative genetics for [facility] Multi-Cancer +RNA Panel.  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 Report date is 11/09/2022.   The Multi-Cancer + RNA Panel offered by [facility] includes sequencing and/or deletion/duplication analysis of the following 70 genes:  AIP*, ALK, APC*, ATM*, AXIN2*, BAP1*, BARD1*, BLM*, BMPR1A*, BRCA1*,  BRCA2*, BRIP1*, CDC73*, CDH1*, CDK4, CDKN1B*, CDKN2A, CHEK2*, CTNNA1*, DICER1*, EPCAM (del/dup only), EGFR, FH*, FLCN*, GREM1 (promoter dup only), HOXB13,  KIT, LZTR1, MAX*, MBD4, MEN1*, MET, MITF, MLH1*, MSH2*, MSH3*, MSH6*, MUTYH*, NF1*, NF2*, NTHL1*, PALB2*, PDGFRA, PMS2*, POLD1*, POLE*, POT1*, PRKAR1A*, PTCH1*, PTEN*, RAD51C*, RAD51D*, RB1*, RET, SDHA* (sequencing only), SDHAF2*, SDHB*, SDHC*, SDHD*, SMAD4*, SMARCA4*, SMARCB1*, SMARCE1*, STK11*, SUFU*, TMEM127*, TP53*, TSC1*, TSC2*, VHL*. RNA analysis is performed for * genes.    11/09/2022 Genetic Testing   Negative genetics for [facility] Multi-Cancer +RNA Panel.  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 Report date is 11/09/2022.   The Multi-Cancer + RNA Panel offered by [facility] includes sequencing and/or deletion/duplication analysis of the following 70 genes:  AIP*, ALK, APC*, ATM*, AXIN2*, BAP1*, BARD1*, BLM*, BMPR1A*, BRCA1*, BRCA2*, BRIP1*, CDC73*, CDH1*, CDK4, CDKN1B*, CDKN2A, CHEK2*, CTNNA1*, DICER1*, EPCAM (del/dup only), EGFR, FH*, FLCN*, GREM1 (promoter dup only), HOXB13, KIT, LZTR1, MAX*, MBD4, MEN1*, MET, MITF, MLH1*, MSH2*, MSH3*, MSH6*, MUTYH*, NF1*, NF2*, NTHL1*, PALB2*, PDGFRA, PMS2*, POLD1*, POLE*, POT1*, PRKAR1A*, PTCH1*, PTEN*, RAD51C*, RAD51D*, RB1*, RET, SDHA* (sequencing only), SDHAF2*, SDHB*, SDHC*, SDHD*, SMAD4*, SMARCA4*, SMARCB1*, SMARCE1*, STK11*, SUFU*, TMEM127*, TP53*, TSC1*, TSC2*, VHL*. RNA analysis is performed for * genes.    Malignant neoplasm of upper-outer quadrant of left breast in female, estrogen receptor positive (HCC)  10/17/2022 Initial Diagnosis   Diffuse bilateral breast pains.  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 Report date is 11/09/2022.   The Multi-Cancer + RNA Panel offered by [facility] includes sequencing and/or deletion/duplication analysis of the following 70 genes:  AIP*, ALK, APC*, ATM*, AXIN2*, BAP1*, BARD1*, BLM*, BMPR1A*, BRCA1*, BRCA2*, BRIP1*, CDC73*, CDH1*, CDK4, CDKN1B*, CDKN2A, CHEK2*, CTNNA1*, DICER1*, EPCAM (del/dup only), EGFR, FH*, FLCN*, GREM1 (promoter dup only), HOXB13, KIT, LZTR1, MAX*, MBD4, MEN1*, MET, MITF, MLH1*, MSH2*, MSH3*, MSH6*, MUTYH*, NF1*, NF2*, NTHL1*, PALB2*, PDGFRA, PMS2*, POLD1*, POLE*, POT1*, PRKAR1A*, PTCH1*, PTEN*, RAD51C*, RAD51D*, RB1*, RET, SDHA* (sequencing only), SDHAF2*, SDHB*, SDHC*, SDHD*, SMAD4*, SMARCA4*, SMARCB1*, SMARCE1*, STK11*, SUFU*, TMEM127*, TP53*, TSC1*, TSC2*, VHL*. RNA analysis is performed for * genes.    11/14/2022 Surgery   Left lumpectomy: Grade 2 ILC with LCIS 3.5 cm, 1/8 lymph node with micrometastases ER 100%, PR 40%, Ki-67 20%, HER2 3+ positive; lymph node prognostic panel: ER 100% PR 20% Ki-67 10%, HER2 2+ by IHC, FISH negative ratio 1.14   06/04/2023 - 07/23/2023 Radiation Therapy   Plan Name: Breast_L_BH Site: Breast, Left Technique: 3D Mode: Photon Dose Per Fraction: 1.8 Gy Prescribed Dose (Delivered / Prescribed): 50.4 Gy / 50.4 Gy Prescribed Fxs (Delivered / Prescribed): 28 / 28   Plan Name: Brst_L_Scv_BH Site: Breast, Left Technique: 3D Mode: Photon Dose Per Fraction: 1.8 Gy Prescribed Dose (Delivered / Prescribed): 50.4 Gy / 50.4 Gy Prescribed Fxs (Delivered / Prescribed): 28 / 28   Plan Name: Brst_L_Bst_BH  Site: Breast, Left Technique: 3D Mode: Photon Dose Per Fraction: 2 Gy Prescribed Dose (Delivered / Prescribed): 12 Gy / 12 Gy Prescribed Fxs  (Delivered / Prescribed): 6 / 6   Malignant neoplasm of lower-outer quadrant of left breast of female, estrogen receptor positive (HCC)  10/30/2022 Initial Diagnosis   Malignant neoplasm of lower-outer quadrant of left breast of female, estrogen receptor positive (HCC)   12/27/2022 -  Chemotherapy   Patient is on Treatment Plan : BREAST Docetaxel + Carboplatin + Trastuzumab (TCH) q21d / Trastuzumab q21d       CURRENT THERAPY: Herceptin  INTERVAL HISTORY: Tonya Vincent 72 y.o. female returns for follow-up of her history of breast cancer currently on treatment with Herceptin every 3 weeks.  Her most recent echocardiogram occurred on July 01, 2023 demonstrating a left ventricular ejection fraction of 55 to 60%.  Shari Prows recently completed radiation therapy and says that her skin has been doing quite well.  She does have a pain in her throat when swallowing.  She also notes of right chest wall discomfort that radiates posteriorly.  Her heart rate and oxygenation are normal today.  The nurse was able to draw blood from her port without difficulty.  She is taking Xarelto daily with good tolerance.   Patient Active Problem List   Diagnosis Date Noted   Deep venous thrombosis (HCC) 02/28/2023   Malignant neoplasm of rectum (HCC) 01/18/2023   Port-A-Cath in place 12/27/2022   Genetic testing 11/12/2022   Malignant neoplasm of lower-outer quadrant of left breast of female, estrogen receptor positive (HCC) 10/30/2022   Malignant neoplasm of upper-outer quadrant of left breast in female, estrogen receptor positive (HCC) 10/23/2022   S/P ORIF (open reduction internal fixation) fracture right ankle 04/11/20 04/21/2020   Closed trimalleolar fracture of right ankle    History of colonic polyps 12/18/2019   Fecal smearing 12/18/2019   Left arm pain 02/04/2019   Rectal bleeding 02/06/2018   Microcytosis 01/01/2017   Essential hypertension 01/02/2016   Change in stool caliber 12/31/2014   Paraspinal  mass 12/29/2014   Anemia in chronic illness 12/29/2014   History of rectal cancer 10/04/2011    is allergic to advanced hand sanitizer [alcohol], norgesic [orphenadrine-aspirin-caffeine], hydrocodone, oxycodone, and antibacterial hand soap [triclosan].  MEDICAL HISTORY: Past Medical History:  Diagnosis Date   ADHD (attention deficit hyperactivity disorder)    Anemia    Anxiety    Arthritis    Breast cancer (HCC) 10/2022   left breast ILC   Complication of anesthesia    difficulty waking up   Depression    Diabetes mellitus    type 2 DM   Dyspnea    with exertion   Heart murmur    History of rectal cancer 10/04/2011   Hypertension    Overactive bladder    Paraspinal mass 12/29/2014   Rectal cancer (HCC) dx'd 11/16/09   xrt comp 01/2010; chemo comp 01/2011   Tubular adenoma of colon 01/08/2014    SURGICAL HISTORY: Past Surgical History:  Procedure Laterality Date   ABDOMINAL HYSTERECTOMY     for uterine fibroids   BREAST LUMPECTOMY WITH RADIOACTIVE SEED LOCALIZATION Left 11/14/2022   Procedure: LEFT BREAST RADIOACTIVE SEED GUIDED LUMPECTOMY x2;  Surgeon: Abigail Miyamoto, MD;  Location: [facility];  Service: General;  Laterality: Left;   COLON SURGERY N/A 04/24/2010   "took out half rectum"; rectal cancer   EYE SURGERY     lasik surgery   HAND SURGERY  carpal tunnel- left   HEMORRHOID SURGERY N/A 1980   IR CV LINE INJECTION  03/22/2023   IR IMAGING GUIDED PORT INSERTION  04/09/2023   IR REMOVAL TUN ACCESS W/ PORT W/O FL MOD SED  04/09/2023   ORIF ANKLE FRACTURE Right 04/11/2020   Procedure: OPEN REDUCTION INTERNAL FIXATION (ORIF) ANKLE FRACTURE;  Surgeon: Vickki Hearing, MD;  Location: [facility];  Service: Orthopedics;  Laterality: Right;   PORT-A-CATH REMOVAL N/A 02/17/2019   Procedure: REMOVAL PORT-A-CATH;  Surgeon: Abigail Miyamoto, MD;  Location: [facility] OR;  Service: General;  Laterality: N/A;   PORTACATH PLACEMENT     2011   PORTACATH PLACEMENT N/A  12/17/2022   Procedure: INSERTION PORT-A-CATH;  Surgeon: Abigail Miyamoto, MD;  Location: [facility] OR;  Service: General;  Laterality: N/A;   RECTAL SURGERY     2010   SENTINEL NODE BIOPSY Left 11/14/2022   Procedure: SENTINEL LYMPH NODE BIOPSY;  Surgeon: Abigail Miyamoto, MD;  Location: [facility];  Service: General;  Laterality: Left;   WISDOM TOOTH EXTRACTION      SOCIAL HISTORY: Social History   Socioeconomic History   Marital status: Single    Spouse name: Not on file   Number of children: Not on file   Years of education: Not on file   Highest education level: Not on file  Occupational History   Not on file  Tobacco Use   Smoking status: Former    Current packs/day: 0.00    Average packs/day: 0.5 packs/day for 35.0 years (17.5 ttl pk-yrs)    Types: Cigarettes, Cigars    Start date: 12/03/1974    Quit date: 12/03/2009    Years since quitting: 13.6   Smokeless tobacco: Never  Vaping Use   Vaping status: Never Used  Substance and Sexual Activity   Alcohol use: Yes    Alcohol/week: 7.0 standard drinks of alcohol    Types: 7 Cans of beer per week    Comment: socially   Drug use: No   Sexual activity: Not Currently    Birth control/protection: Surgical    Comment: Hyst  Other Topics Concern   Not on file  Social History Narrative   Not on file   Social Determinants of Health   Financial Resource Strain: Not on file  Food Insecurity: No Food Insecurity (05/22/2023)   Hunger Vital Sign    Worried About Running Out of Food in the Last Year: Never true    Ran Out of Food in the Last Year: Never true  Transportation Needs: No Transportation Needs (05/22/2023)   PRAPARE - Administrator, Civil Service (Medical): No    Lack of Transportation (Non-Medical): No  Physical Activity: Not on file  Stress: Not on file  Social Connections: Not on file  Intimate Partner Violence: Not At Risk (05/22/2023)   Humiliation, Afraid, Rape, and Kick questionnaire     Fear of Current or Ex-Partner: No    Emotionally Abused: No    Physically Abused: No    Sexually Abused: No    FAMILY HISTORY: Family History  Problem Relation Age of Onset   Leukemia Father 81   Breast cancer Maternal Aunt        dx > 50; mother's maternal half sister   Kidney cancer Maternal Uncle        dx > 50   Breast cancer Paternal Aunt        dx > 50   Breast cancer Cousin  dx > 52; paternal female cousin   Mesothelioma Cousin        paternal female cousin   Colon cancer Neg Hx    Esophageal cancer Neg Hx    Stomach cancer Neg Hx    Rectal cancer Neg Hx     Review of Systems  Constitutional:  Negative for appetite change, chills, fatigue, fever and unexpected weight change.  HENT:   Negative for hearing loss, lump/mass and trouble swallowing.   Eyes:  Negative for eye problems and icterus.  Respiratory:  Negative for chest tightness, cough and shortness of breath.   Cardiovascular:  Negative for chest pain, leg swelling and palpitations.  Gastrointestinal:  Negative for abdominal distention, abdominal pain, constipation, diarrhea, nausea and vomiting.  Endocrine: Negative for hot flashes.  Genitourinary:  Negative for difficulty urinating.   Musculoskeletal:  Negative for arthralgias.  Skin:  Negative for itching and rash.  Neurological:  Negative for dizziness, extremity weakness, headaches and numbness.  Hematological:  Negative for adenopathy. Does not bruise/bleed easily.  Psychiatric/Behavioral:  Negative for depression. The patient is not nervous/anxious.     PHYSICAL EXAMINATION   Onc Performance Status - 07/25/23 0800       KPS SCALE   KPS % SCORE Normal, no compliants, no evidence of disease             Vitals:   07/25/23 0844  BP: 131/65  Pulse: 66  Resp: 16  Temp: 98 F (36.7 C)  SpO2: 98%    Physical Exam Constitutional:      General: She is not in acute distress.    Appearance: Normal appearance. She is not toxic-appearing.   HENT:     Head: Normocephalic and atraumatic.     Mouth/Throat:     Mouth: Mucous membranes are moist.     Pharynx: Oropharynx is clear. No oropharyngeal exudate or posterior oropharyngeal erythema.  Eyes:     General: No scleral icterus. Cardiovascular:     Rate and Rhythm: Normal rate and regular rhythm.     Pulses: Normal pulses.     Heart sounds: Normal heart sounds.  Pulmonary:     Effort: Pulmonary effort is normal.     Breath sounds: Normal breath sounds.  Abdominal:     General: Abdomen is flat. Bowel sounds are normal. There is no distension.     Palpations: Abdomen is soft.     Tenderness: There is no abdominal tenderness.  Musculoskeletal:        General: No swelling.     Cervical back: Neck supple.  Lymphadenopathy:     Cervical: No cervical adenopathy.  Skin:    General: Skin is warm and dry.     Findings: No rash.  Neurological:     General: No focal deficit present.     Mental Status: She is alert.  Psychiatric:        Mood and Affect: Mood normal.        Behavior: Behavior normal.     LABORATORY DATA:  CBC    Component Value Date/Time   WBC 3.2 (L) 07/25/2023 0818   WBC 10.2 01/30/2023 2208   RBC 3.58 (L) 07/25/2023 0818   HGB 10.5 (L) 07/25/2023 0818   HGB 12.6 01/01/2017 0841   HCT 33.0 (L) 07/25/2023 0818   HCT 39.2 01/01/2017 0841   PLT 196 07/25/2023 0818   PLT 301 01/01/2017 0841   MCV 92.2 07/25/2023 0818   MCV 78.7 (L) 01/01/2017 0841   MCH  29.3 07/25/2023 0818   MCHC 31.8 07/25/2023 0818   RDW 16.8 (H) 07/25/2023 0818   RDW 17.9 (H) 01/01/2017 0841   LYMPHSABS 0.5 (L) 07/25/2023 0818   LYMPHSABS 1.3 01/01/2017 0841   MONOABS 0.4 07/25/2023 0818   MONOABS 0.5 01/01/2017 0841   EOSABS 0.2 07/25/2023 0818   EOSABS 0.1 01/01/2017 0841   BASOSABS 0.0 07/25/2023 0818   BASOSABS 0.0 01/01/2017 0841    CMP     Component Value Date/Time   NA 137 07/25/2023 0818   NA 141 01/01/2017 0841   K 4.1 07/25/2023 0818   K 3.7 01/01/2017  0841   CL 104 07/25/2023 0818   CL 99 11/05/2012 0842   CO2 24 07/25/2023 0818   CO2 25 01/01/2017 0841   GLUCOSE 139 (H) 07/25/2023 0818   GLUCOSE 106 01/01/2017 0841   GLUCOSE 167 (H) 11/05/2012 0842   BUN 19 07/25/2023 0818   BUN 18.2 01/01/2017 0841   CREATININE 1.16 (H) 07/25/2023 0818   CREATININE 1.3 (H) 01/01/2017 0841   CALCIUM 8.9 07/25/2023 0818   CALCIUM 9.5 01/01/2017 0841   PROT 7.2 07/25/2023 0818   PROT 7.5 01/01/2017 0841   ALBUMIN 3.7 07/25/2023 0818   ALBUMIN 4.0 01/01/2017 0841   AST 15 07/25/2023 0818   AST 13 01/01/2017 0841   ALT 17 07/25/2023 0818   ALT 16 01/01/2017 0841   ALKPHOS 83 07/25/2023 0818   ALKPHOS 89 01/01/2017 0841   BILITOT 0.6 07/25/2023 0818   BILITOT 0.29 01/01/2017 0841   GFRNONAA 51 (L) 07/25/2023 0818   GFRAA >60 05/27/2020 1443   GFRAA 52 (L) 01/12/2019 1330        ASSESSMENT and THERAPY PLAN:   Malignant neoplasm of upper-outer quadrant of left breast in female, estrogen receptor positive (HCC) 10/17/2022 Diffuse bilateral breast pains.  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 Right chest wall pain: We will obtain stat chest x-ray after her treatment today.  I will see if she can pop back over here once she completes her chest x-ray so we can review the results and discuss next steps.   At risk for heart failure: Her most recent echocardiogram in July was normal and stable.  She will repeat an echocardiogram in October 2024.  Port associated DVT: Improved, taking Xarelto daily with good tolerance  Next steps: I recommended that she start anastrozole as her antiestrogen therapy which will begin in September 2024.  We discussed the mechanism of action, risks, and benefits of the medication and she is agreeable to proceed.  She will return to clinic every 3 weeks for Herceptin, and will see  myself or Dr. Pamelia Hoit with every other treatment.     All questions were answered. The patient knows to call the clinic with any problems, questions or concerns. We can certainly see the patient much sooner if necessary.  Total encounter time:30 minutes*in face-to-face visit time, chart review, lab review, care coordination, order entry, and documentation of the encounter time.    Lillard Anes, NP 07/25/23 9:38 AM Medical Oncology and Hematology [facility] [address] [place], [place] [address] Tel. [phone]    Fax. [phone]  *Total Encounter Time as defined by the Centers for Medicare and Medicaid Services includes, in addition to the face-to-face time of a patient visit (documented in the note above) non-face-to-face time: obtaining and reviewing outside history, ordering and reviewing medications, tests or procedures, care coordination (communications with other health care professionals or caregivers) and  documentation in the medical record.

## 2023-08-15 ENCOUNTER — Inpatient Hospital Stay: Payer: Medicare HMO | Attending: Hematology and Oncology

## 2023-08-15 VITALS — BP 145/71 | HR 79 | Temp 98.3°F | Resp 16 | Ht 63.0 in | Wt 185.5 lb

## 2023-08-15 DIAGNOSIS — C50512 Malignant neoplasm of lower-outer quadrant of left female breast: Secondary | ICD-10-CM | POA: Insufficient documentation

## 2023-08-15 DIAGNOSIS — C50412 Malignant neoplasm of upper-outer quadrant of left female breast: Secondary | ICD-10-CM | POA: Insufficient documentation

## 2023-08-15 DIAGNOSIS — Z170 Estrogen receptor positive status [ER+]: Secondary | ICD-10-CM | POA: Diagnosis not present

## 2023-08-15 DIAGNOSIS — Z5111 Encounter for antineoplastic chemotherapy: Secondary | ICD-10-CM | POA: Diagnosis present

## 2023-08-15 DIAGNOSIS — Z85048 Personal history of other malignant neoplasm of rectum, rectosigmoid junction, and anus: Secondary | ICD-10-CM | POA: Insufficient documentation

## 2023-08-15 DIAGNOSIS — Z923 Personal history of irradiation: Secondary | ICD-10-CM | POA: Diagnosis not present

## 2023-08-15 DIAGNOSIS — Z79899 Other long term (current) drug therapy: Secondary | ICD-10-CM | POA: Insufficient documentation

## 2023-08-15 MED ORDER — DIPHENHYDRAMINE HCL 25 MG PO CAPS
50.0000 mg | ORAL_CAPSULE | Freq: Once | ORAL | Status: AC
  Administered 2023-08-15: 50 mg via ORAL
  Filled 2023-08-15: qty 2

## 2023-08-15 MED ORDER — HEPARIN SOD (PORK) LOCK FLUSH 100 UNIT/ML IV SOLN
500.0000 [IU] | Freq: Once | INTRAVENOUS | Status: AC | PRN
  Administered 2023-08-15: 500 [IU]

## 2023-08-15 MED ORDER — SODIUM CHLORIDE 0.9% FLUSH
10.0000 mL | INTRAVENOUS | Status: DC | PRN
  Administered 2023-08-15: 10 mL

## 2023-08-15 MED ORDER — ACETAMINOPHEN 325 MG PO TABS
650.0000 mg | ORAL_TABLET | Freq: Once | ORAL | Status: AC
  Administered 2023-08-15: 650 mg via ORAL
  Filled 2023-08-15: qty 2

## 2023-08-15 MED ORDER — SODIUM CHLORIDE 0.9 % IV SOLN: Freq: Once | INTRAVENOUS | Status: AC

## 2023-08-15 MED ORDER — TRASTUZUMAB-ANNS CHEMO 150 MG IV SOLR
6.0000 mg/kg | Freq: Once | INTRAVENOUS | Status: AC
  Administered 2023-08-15: 525 mg via INTRAVENOUS
  Filled 2023-08-15: qty 25

## 2023-08-15 NOTE — Patient Instructions (Signed)
[facility]  Discharge Instructions: Thank you for choosing [facility] to provide your oncology and hematology care.   If you have a lab appointment with the Cancer Center, please go directly to the Cancer Center and check in at the registration area.   Wear comfortable clothing and clothing appropriate for easy access to any Portacath or PICC line.   We strive to give you quality time with your provider. You may need to reschedule your appointment if you arrive late (15 or more minutes).  Arriving late affects you and other patients whose appointments are after yours.  Also, if you miss three or more appointments without notifying the office, you may be dismissed from the clinic at the provider's discretion.      For prescription refill requests, have your pharmacy contact our office and allow 72 hours for refills to be completed.    Today you received the following chemotherapy and/or immunotherapy agents: trastuzumab-anns      To help prevent nausea and vomiting after your treatment, we encourage you to take your nausea medication as directed.  BELOW ARE SYMPTOMS THAT SHOULD BE REPORTED IMMEDIATELY: *FEVER GREATER THAN 100.4 F (38 C) OR HIGHER *CHILLS OR SWEATING *NAUSEA AND VOMITING THAT IS NOT CONTROLLED WITH YOUR NAUSEA MEDICATION *UNUSUAL SHORTNESS OF BREATH *UNUSUAL BRUISING OR BLEEDING *URINARY PROBLEMS (pain or burning when urinating, or frequent urination) *BOWEL PROBLEMS (unusual diarrhea, constipation, pain near the anus) TENDERNESS IN MOUTH AND THROAT WITH OR WITHOUT PRESENCE OF ULCERS (sore throat, sores in mouth, or a toothache) UNUSUAL RASH, SWELLING OR PAIN  UNUSUAL VAGINAL DISCHARGE OR ITCHING   Items with * indicate a potential emergency and should be followed up as soon as possible or go to the Emergency Department if any problems should occur.  Please show the CHEMOTHERAPY ALERT CARD or IMMUNOTHERAPY ALERT CARD  at check-in to the Emergency Department and triage nurse.  Should you have questions after your visit or need to cancel or reschedule your appointment, please contact [facility]  Dept: [phone]  and follow the prompts.  Office hours are 8:00 a.m. to 4:30 p.m. Monday - Friday. Please note that voicemails left after 4:00 p.m. may not be returned until the following business day.  We are closed weekends and major holidays. You have access to a nurse at all times for urgent questions. Please call the main number to the clinic Dept: [phone] and follow the prompts.   For any non-urgent questions, you may also contact your provider using MyChart. We now offer e-Visits for anyone 18 and older to request care online for non-urgent symptoms. For details visit [email or web address].   Also download the MyChart app! Go to the app store, search "MyChart", open the app, select [facility], and log in with your MyChart username and password.   

## 2023-08-20 ENCOUNTER — Encounter: Payer: Self-pay | Admitting: *Deleted

## 2023-08-26 ENCOUNTER — Ambulatory Visit: Payer: Medicare HMO | Attending: Surgery

## 2023-08-26 VITALS — Wt 187.4 lb

## 2023-08-26 DIAGNOSIS — Z483 Aftercare following surgery for neoplasm: Secondary | ICD-10-CM

## 2023-08-26 NOTE — Therapy (Signed)
OUTPATIENT PHYSICAL THERAPY SOZO SCREENING NOTE   Patient Name: Tonya Vincent MRN: [id number] DOB:[birth year], 72 y.o., female Today's Date: 08/26/2023  PCP: Renaye Rakers, MD REFERRING PROVIDER: Abigail Miyamoto, MD   PT End of Session - 08/26/23 1005     Visit Number 2   # unchanged due to screen only   PT Start Time 1003    PT Stop Time 1007    PT Time Calculation (min) 4 min    Activity Tolerance Patient tolerated treatment well    Behavior During Therapy [facility] for tasks assessed/performed             Past Medical History:  Diagnosis Date   ADHD (attention deficit hyperactivity disorder)    Anemia    Anxiety    Arthritis    Breast cancer (HCC) 10/2022   left breast ILC   Complication of anesthesia    difficulty waking up   Depression    Diabetes mellitus    type 2 DM   Dyspnea    with exertion   Heart murmur    History of rectal cancer 10/04/2011   Hypertension    Overactive bladder    Paraspinal mass 12/29/2014   Rectal cancer (HCC) dx'd 11/16/09   xrt comp 01/2010; chemo comp 01/2011   Tubular adenoma of colon 01/08/2014   Past Surgical History:  Procedure Laterality Date   ABDOMINAL HYSTERECTOMY     for uterine fibroids   BREAST LUMPECTOMY WITH RADIOACTIVE SEED LOCALIZATION Left 11/14/2022   Procedure: LEFT BREAST RADIOACTIVE SEED GUIDED LUMPECTOMY x2;  Surgeon: Abigail Miyamoto, MD;  Location: [facility];  Service: General;  Laterality: Left;   COLON SURGERY N/A 04/24/2010   "took out half rectum"; rectal cancer   EYE SURGERY     lasik surgery   HAND SURGERY     carpal tunnel- left   HEMORRHOID SURGERY N/A 1980   IR CV LINE INJECTION  03/22/2023   IR IMAGING GUIDED PORT INSERTION  04/09/2023   IR REMOVAL TUN ACCESS W/ PORT W/O FL MOD SED  04/09/2023   ORIF ANKLE FRACTURE Right 04/11/2020   Procedure: OPEN REDUCTION INTERNAL FIXATION (ORIF) ANKLE FRACTURE;  Surgeon: Vickki Hearing, MD;  Location: [facility];  Service: Orthopedics;   Laterality: Right;   PORT-A-CATH REMOVAL N/A 02/17/2019   Procedure: REMOVAL PORT-A-CATH;  Surgeon: Abigail Miyamoto, MD;  Location: [facility] OR;  Service: General;  Laterality: N/A;   PORTACATH PLACEMENT     2011   PORTACATH PLACEMENT N/A 12/17/2022   Procedure: INSERTION PORT-A-CATH;  Surgeon: Abigail Miyamoto, MD;  Location: [facility] OR;  Service: General;  Laterality: N/A;   RECTAL SURGERY     2010   SENTINEL NODE BIOPSY Left 11/14/2022   Procedure: SENTINEL LYMPH NODE BIOPSY;  Surgeon: Abigail Miyamoto, MD;  Location: [facility];  Service: General;  Laterality: Left;   WISDOM TOOTH EXTRACTION     Patient Active Problem List   Diagnosis Date Noted   Deep venous thrombosis (HCC) 02/28/2023   Malignant neoplasm of rectum (HCC) 01/18/2023   Port-A-Cath in place 12/27/2022   Genetic testing 11/12/2022   Malignant neoplasm of lower-outer quadrant of left breast of female, estrogen receptor positive (HCC) 10/30/2022   Malignant neoplasm of upper-outer quadrant of left breast in female, estrogen receptor positive (HCC) 10/23/2022   S/P ORIF (open reduction internal fixation) fracture right ankle 04/11/20 04/21/2020   Closed trimalleolar fracture of right ankle    History of colonic polyps 12/18/2019  Fecal smearing 12/18/2019   Left arm pain 02/04/2019   Rectal bleeding 02/06/2018   Microcytosis 01/01/2017   Essential hypertension 01/02/2016   Change in stool caliber 12/31/2014   Paraspinal mass 12/29/2014   Anemia in chronic illness 12/29/2014   History of rectal cancer 10/04/2011    REFERRING DIAG: left breast cancer at risk for lymphedema  THERAPY DIAG:  Aftercare following surgery for neoplasm  PERTINENT HISTORY: Patient was diagnosed on 10/12/2022 with 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 It measures 4 mm and 8 mm and is located in the upper outer quadrant. The smaller mass is triple positive with a Ki67 of 40%. The larger mass is ER/PR positive and  HER2 negative with a Ki67 of 15% . Pt had left lumpectomy with SLNB on 11/14/2022. 1/8 LN's positive. She will be having chemotherapy with Herceptin maintenance, radiation and anti estrogens. There is a deep seroma at the lumpectomy site identified by surgeon with no further treatment required at present per MD. Pt is a part time pharmacist   PRECAUTIONS: left UE Lymphedema risk, None  SUBJECTIVE: Pt returns for her first 3 month L-Dex screen.   PAIN:  Are you having pain? No  SOZO SCREENING: Patient was assessed today using the SOZO machine to determine the lymphedema index score. This was compared to her baseline score. It was determined that she is within the recommended range when compared to her baseline and no further action is needed at this time. She will continue SOZO screenings. These are done every 3 months for 2 years post operatively followed by every 6 months for 2 years, and then annually.   L-DEX FLOWSHEETS - 08/26/23 1000       L-DEX LYMPHEDEMA SCREENING   Measurement Type Unilateral    L-DEX MEASUREMENT EXTREMITY Upper Extremity    POSITION  Standing    DOMINANT SIDE Right    At Risk Side Left    BASELINE SCORE (UNILATERAL) 2.5    L-DEX SCORE (UNILATERAL) -3.3    VALUE CHANGE (UNILAT) -5.8              Hermenia Bers, PTA 08/26/2023, 10:06 AM

## 2023-08-27 ENCOUNTER — Other Ambulatory Visit: Payer: Self-pay

## 2023-08-28 ENCOUNTER — Encounter: Payer: Self-pay | Admitting: Radiation Oncology

## 2023-08-28 NOTE — Progress Notes (Signed)
Radiation Oncology         (336) [phone] ________________________________  Name: Tonya Vincent MRN: [id number]  Date: 08/29/2023  DOB: 1950/12/09  Follow-Up Visit Note  CC: Renaye Rakers, MD  Renaye Rakers, MD  No diagnosis found.  Diagnosis: S/p lumpectomies x 2 and SLN biopsies : Left Breast LOQ and UOQ, Invasive and in situ lobular carcinoma, ER+ / PR+ / Her2+, Grade 2: with clean margins and 1 positive node (prognostics performed on the positive SLN showed ER/PR+, Her2-)   Stage (cT1c, Nx ) Left Breast UOQ, Invasive and in-situ Lobular Carcinoma of 2 different sites:  -- 3 o'clock left breast: ER+ / PR+ / Her2+, Grade 2 -- 2 o'clock left breast: ER+ / PR+ / Her2-, Grade 2    Interval Since Last Radiation: 1 month and 6 days   Indication for treatment: Curative       Radiation treatment dates:  06/05/23 through 07/23/23 Site/dose:   1) Left breast - 50.04 Gy delivered 28 Fx at 1.8 Gy/Fx  2) Left supraclavicular area - 50.04 Gy delivered 28 Fx at 1.8 Gy/Fx  3) Left breast boost - 12 Gy delivered in 6 Fx at 2 Gy/Fx Beams/energy:   1&2) 10X, 6X-FFF 3) 6X, 15X  Narrative:  The patient returns today for routine follow-up.  The patient tolerated radiation treatment relatively well. During her final weekly treatment check on 07/22/23, the patient endorsed some tenderness to her breast, increasing fatigue, hyperpigmentation changes (used radiaplex as directed). Physical exam performed on that same date showed hyperpigmentation changes to the left breast and supraclavicular area, as well as some mild dry desquamation. No skin breakdown was appreciated.         While undergoing radiation therapy (and since completing radiation therapy) she has continued with maintenance herceptin (given every 3 weeks) under Dr. Pamelia Hoit. She has tolerated herceptin maintenance well thus far other than odynophagia (managed with omeprazole 40 mg daily), and right chest wall pain which she reported during her  most recent follow-up visit with Dr. Pamelia Hoit on 07/25/23. A chest x-ray was subsequently performed that day which showed no acute cardiopulmonary abnormalities.   She will continue receiving herceptin and has opted to proceed with anastrozle for her antiestrogen therapy.     ***                       Allergies:  is allergic to advanced hand sanitizer [alcohol], norgesic [orphenadrine-aspirin-caffeine], hydrocodone, oxycodone, and antibacterial hand soap [triclosan].  Meds: Current Outpatient Medications  Medication Sig Dispense Refill   amLODipine (NORVASC) 10 MG tablet Take 10 mg by mouth at bedtime.      anastrozole (ARIMIDEX) 1 MG tablet Take 1 tablet (1 mg total) by mouth daily. 90 tablet 3   Aromatic Inhalants (VICKS VAPOINHALER IN) Inhale 1 puff into the lungs daily as needed (congestion).     atorvastatin (LIPITOR) 10 MG tablet Take 10 mg by mouth daily.     doxazosin (CARDURA) 2 MG tablet      empagliflozin (JARDIANCE) 25 MG TABS tablet Take 25 mg by mouth daily with supper.      ibuprofen (ADVIL) 800 MG tablet Take 800 mg by mouth every 8 (eight) hours as needed for moderate pain.     latanoprost (XALATAN) 0.005 % ophthalmic solution Place 1 drop into both eyes at bedtime.      lidocaine-prilocaine (EMLA) cream Apply to affected area once 30 g 3   loperamide (IMODIUM A-D) 2 MG  tablet Take 2 mg by mouth 4 (four) times daily as needed for diarrhea or loose stools.     loratadine (CLARITIN REDITABS) 10 MG dissolvable tablet Take 10 mg by mouth daily.     olmesartan (BENICAR) 40 MG tablet Take 40 mg by mouth daily.     omeprazole (PRILOSEC) 40 MG capsule Take 1 capsule (40 mg total) by mouth daily. 90 capsule 1   oxymetazoline (AFRIN) 0.05 % nasal spray Place 1 spray into both nostrils 2 (two) times daily. (Patient not taking: Reported on 07/25/2023)     prochlorperazine (COMPAZINE) 10 MG tablet Take 1 tablet (10 mg total) by mouth every 6 (six) hours as needed for nausea or vomiting.  (Patient not taking: Reported on 07/25/2023) 30 tablet 1   rivaroxaban (XARELTO) 20 MG TABS tablet Take 1 tablet (20 mg total) by mouth daily with supper. 90 tablet 3   Semaglutide, 1 MG/DOSE, 2 MG/1.5ML SOPN Inject 1 mg into the skin every Sunday.     spironolactone (ALDACTONE) 25 MG tablet Take 50 mg by mouth daily.     vortioxetine HBr (TRINTELLIX) 10 MG TABS tablet Take 10 mg by mouth daily with supper.     No current facility-administered medications for this encounter.    Physical Findings: The patient is in no acute distress. Patient is alert and oriented.  vitals were not taken for this visit. .  No significant changes. Lungs are clear to auscultation bilaterally. Heart has regular rate and rhythm. No palpable cervical, supraclavicular, or axillary adenopathy. Abdomen soft, non-tender, normal bowel sounds.  Right Breast: no palpable mass, nipple discharge or bleeding. Left Breast: ***  Lab Findings: Lab Results  Component Value Date   WBC 3.2 (L) 07/25/2023   HGB 10.5 (L) 07/25/2023   HCT 33.0 (L) 07/25/2023   MCV 92.2 07/25/2023   PLT 196 07/25/2023    Radiographic Findings: No results found.  Impression: S/p lumpectomies x 2 and SLN biopsies : Left Breast LOQ and UOQ, Invasive and in situ lobular carcinoma, ER+ / PR+ / Her2+, Grade 2: with clean margins and 1 positive node (prognostics performed on the positive SLN showed ER/PR+, Her2-)   Stage (cT1c, Nx ) Left Breast UOQ, Invasive and in-situ Lobular Carcinoma of 2 different sites:  -- 3 o'clock left breast: ER+ / PR+ / Her2+, Grade 2 -- 2 o'clock left breast: ER+ / PR+ / Her2-, Grade 2    The patient is recovering from the effects of radiation.  ***  Plan:  ***   *** minutes of total time was spent for this patient encounter, including preparation, face-to-face counseling with the patient and coordination of care, physical exam, and documentation of the encounter. ____________________________________  Billie Lade, PhD, MD  This document serves as a record of services personally performed by Antony Blackbird, MD. It was created on his behalf by Neena Rhymes, a trained medical scribe. The creation of this record is based on the scribe's personal observations and the provider's statements to them. This document has been checked and approved by the attending provider.

## 2023-08-28 NOTE — Progress Notes (Signed)
Radiation Oncology         (336) [phone] ________________________________  Name: Tonya Vincent MRN: [id number]  Date: 08/29/2023  DOB: 1951-09-05  End of Treatment Note  Diagnosis: S/p lumpectomies x 2 and SLN biopsies : Left Breast LOQ and UOQ, Invasive and in situ lobular carcinoma, ER+ / PR+ / Her2+, Grade 2: with clean margins and 1 positive node (prognostics performed on the positive SLN showed ER/PR+, Her2-)   Stage (cT1c, Nx ) Left Breast UOQ, Invasive and in-situ Lobular Carcinoma of 2 different sites:  -- 3 o'clock left breast: ER+ / PR+ / Her2+, Grade 2 -- 2 o'clock left breast: ER+ / PR+ / Her2-, Grade 2     Indication for treatment: Curative        Radiation treatment dates:  06/05/23 through 07/23/23  Site/dose:   1) Left breast - 50.04 Gy delivered 28 Fx at 1.8 Gy/Fx  2) Left supraclavicular area - 50.04 Gy delivered 28 Fx at 1.8 Gy/Fx  3) Left breast boost - 12 Gy delivered in 6 Fx at 2 Gy/Fx  Beams/energy:   1) 10X, 6X-FFF 3) 6X, 15X  Technique/Mode: 3D / Photon   Narrative: The patient tolerated radiation treatment relatively well. During her final weekly treatment check on 07/22/23, the patient endorsed some tenderness to her breast, increasing fatigue, hyperpigmentation changes (used radiaplex as directed). Physical exam performed on that same date showed hyperpigmentation changes to the left breast and supraclavicular area, as well as some mild dry desquamation. No skin breakdown was appreciated.   Plan: The patient has completed radiation treatment. The patient will return to radiation oncology clinic for routine followup in one month. I advised them to call or return sooner if they have any questions or concerns related to their recovery or treatment.  -----------------------------------  Billie Lade, PhD, MD  This document serves as a record of services personally performed by Antony Blackbird, MD. It was created on his behalf by Neena Rhymes, a  trained medical scribe. The creation of this record is based on the scribe's personal observations and the provider's statements to them. This document has been checked and approved by the attending provider.

## 2023-08-29 ENCOUNTER — Encounter: Payer: Self-pay | Admitting: Radiation Oncology

## 2023-08-29 ENCOUNTER — Ambulatory Visit
Admission: RE | Admit: 2023-08-29 | Discharge: 2023-08-29 | Disposition: A | Discharge status: Home / Self Care | Payer: Medicare HMO | Source: Ambulatory Visit | Attending: Radiation Oncology | Admitting: Radiation Oncology

## 2023-08-29 VITALS — BP 151/75 | HR 70 | Temp 96.9°F | Resp 20 | Ht 63.0 in | Wt 184.2 lb

## 2023-08-29 DIAGNOSIS — C50512 Malignant neoplasm of lower-outer quadrant of left female breast: Secondary | ICD-10-CM | POA: Insufficient documentation

## 2023-08-29 DIAGNOSIS — Z923 Personal history of irradiation: Secondary | ICD-10-CM | POA: Insufficient documentation

## 2023-08-29 DIAGNOSIS — Z170 Estrogen receptor positive status [ER+]: Secondary | ICD-10-CM | POA: Diagnosis not present

## 2023-08-29 HISTORY — DX: Personal history of irradiation: Z92.3

## 2023-08-29 NOTE — Progress Notes (Addendum)
STEFFI BARSNESS is here today for follow up post radiation to the breast.   Breast Side:Left   They completed their radiation on: 07/23/2023  Does the patient complain of any of the following: Post radiation skin issues: Itchiness and discoloration Breast Tenderness: Yes Breast Swelling: Yes Lymphadema: Yes Range of Motion limitations: No Fatigue post radiation: Yes Appetite good/fair/poor: Good, but she reports her taste is still off.

## 2023-09-05 ENCOUNTER — Inpatient Hospital Stay: Payer: Medicare HMO

## 2023-09-05 ENCOUNTER — Inpatient Hospital Stay: Payer: Medicare HMO | Attending: Hematology and Oncology | Admitting: Hematology and Oncology

## 2023-09-05 ENCOUNTER — Ambulatory Visit: Payer: Medicare HMO

## 2023-09-05 ENCOUNTER — Ambulatory Visit: Payer: Medicare HMO | Admitting: Hematology and Oncology

## 2023-09-05 VITALS — BP 160/75 | HR 77 | Temp 98.0°F | Resp 18 | Ht 63.0 in | Wt 187.6 lb

## 2023-09-05 DIAGNOSIS — C50412 Malignant neoplasm of upper-outer quadrant of left female breast: Secondary | ICD-10-CM | POA: Diagnosis not present

## 2023-09-05 DIAGNOSIS — Z79899 Other long term (current) drug therapy: Secondary | ICD-10-CM | POA: Insufficient documentation

## 2023-09-05 DIAGNOSIS — Z170 Estrogen receptor positive status [ER+]: Secondary | ICD-10-CM | POA: Diagnosis not present

## 2023-09-05 DIAGNOSIS — Z923 Personal history of irradiation: Secondary | ICD-10-CM | POA: Insufficient documentation

## 2023-09-05 DIAGNOSIS — C50512 Malignant neoplasm of lower-outer quadrant of left female breast: Secondary | ICD-10-CM

## 2023-09-05 DIAGNOSIS — I10 Essential (primary) hypertension: Secondary | ICD-10-CM | POA: Diagnosis not present

## 2023-09-05 DIAGNOSIS — Z79811 Long term (current) use of aromatase inhibitors: Secondary | ICD-10-CM | POA: Insufficient documentation

## 2023-09-05 DIAGNOSIS — Z5111 Encounter for antineoplastic chemotherapy: Secondary | ICD-10-CM | POA: Diagnosis present

## 2023-09-05 DIAGNOSIS — I427 Cardiomyopathy due to drug and external agent: Secondary | ICD-10-CM

## 2023-09-05 LAB — CBC WITH DIFFERENTIAL (CANCER CENTER ONLY)
Abs Immature Granulocytes: 0.01 10*3/uL (ref 0.00–0.07)
Basophils Absolute: 0 10*3/uL (ref 0.0–0.1)
Basophils Relative: 1 %
Eosinophils Absolute: 0.1 10*3/uL (ref 0.0–0.5)
Eosinophils Relative: 3 %
HCT: 33.4 % — ABNORMAL LOW (ref 36.0–46.0)
Hemoglobin: 10.9 g/dL — ABNORMAL LOW (ref 12.0–15.0)
Immature Granulocytes: 0 %
Lymphocytes Relative: 15 %
Lymphs Abs: 0.5 10*3/uL — ABNORMAL LOW (ref 0.7–4.0)
MCH: 29.1 pg (ref 26.0–34.0)
MCHC: 32.6 g/dL (ref 30.0–36.0)
MCV: 89.3 fL (ref 80.0–100.0)
Monocytes Absolute: 0.4 10*3/uL (ref 0.1–1.0)
Monocytes Relative: 13 %
Neutro Abs: 2.2 10*3/uL (ref 1.7–7.7)
Neutrophils Relative %: 68 %
Platelet Count: 206 10*3/uL (ref 150–400)
RBC: 3.74 MIL/uL — ABNORMAL LOW (ref 3.87–5.11)
RDW: 15.6 % — ABNORMAL HIGH (ref 11.5–15.5)
WBC Count: 3.3 10*3/uL — ABNORMAL LOW (ref 4.0–10.5)
nRBC: 0 % (ref 0.0–0.2)

## 2023-09-05 LAB — CMP (CANCER CENTER ONLY)
ALT: 12 U/L (ref 0–44)
AST: 13 U/L — ABNORMAL LOW (ref 15–41)
Albumin: 4 g/dL (ref 3.5–5.0)
Alkaline Phosphatase: 92 U/L (ref 38–126)
Anion gap: 7 (ref 5–15)
BUN: 12 mg/dL (ref 8–23)
CO2: 26 mmol/L (ref 22–32)
Calcium: 9 mg/dL (ref 8.9–10.3)
Chloride: 108 mmol/L (ref 98–111)
Creatinine: 1.15 mg/dL — ABNORMAL HIGH (ref 0.44–1.00)
GFR, Estimated: 51 mL/min — ABNORMAL LOW (ref >60)
Glucose, Bld: 105 mg/dL — ABNORMAL HIGH (ref 70–99)
Potassium: 3.7 mmol/L (ref 3.5–5.1)
Sodium: 141 mmol/L (ref 135–145)
Total Bilirubin: 0.5 mg/dL (ref 0.3–1.2)
Total Protein: 7.1 g/dL (ref 6.5–8.1)

## 2023-09-05 MED ORDER — TRASTUZUMAB-ANNS CHEMO 150 MG IV SOLR
6.0000 mg/kg | Freq: Once | INTRAVENOUS | Status: AC
  Administered 2023-09-05: 525 mg via INTRAVENOUS
  Filled 2023-09-05: qty 25

## 2023-09-05 MED ORDER — SODIUM CHLORIDE 0.9 % IV SOLN: Freq: Once | INTRAVENOUS | Status: AC

## 2023-09-05 MED ORDER — SODIUM CHLORIDE 0.9% FLUSH
10.0000 mL | INTRAVENOUS | Status: DC | PRN
  Administered 2023-09-05: 10 mL

## 2023-09-05 MED ORDER — DIPHENHYDRAMINE HCL 25 MG PO CAPS
50.0000 mg | ORAL_CAPSULE | Freq: Once | ORAL | Status: AC
  Administered 2023-09-05: 50 mg via ORAL
  Filled 2023-09-05: qty 2

## 2023-09-05 MED ORDER — ACETAMINOPHEN 325 MG PO TABS
650.0000 mg | ORAL_TABLET | Freq: Once | ORAL | Status: AC
  Administered 2023-09-05: 650 mg via ORAL
  Filled 2023-09-05: qty 2

## 2023-09-05 MED ORDER — HEPARIN SOD (PORK) LOCK FLUSH 100 UNIT/ML IV SOLN
500.0000 [IU] | Freq: Once | INTRAVENOUS | Status: AC | PRN
  Administered 2023-09-05: 500 [IU]

## 2023-09-05 NOTE — Progress Notes (Signed)
Patient Care Team: Renaye Rakers, MD as PCP - General (Family Medicine) Artis Delay, MD as Consulting Physician (Hematology and Oncology) Donnelly Angelica, RN as Oncology Nurse Navigator Pershing Proud, RN as Oncology Nurse Navigator [name], MD as Consulting Physician (Hematology and Oncology) Abigail Miyamoto, MD as Consulting Physician (General Surgery) Antony Blackbird, MD as Consulting Physician (Radiation Oncology)  DIAGNOSIS:  Encounter Diagnoses  Name Primary?   Malignant neoplasm of upper-outer quadrant of left breast in female, estrogen receptor positive (HCC) Yes   Malignant neoplasm of lower-outer quadrant of left breast of female, estrogen receptor positive (HCC)     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 after neoadjuvant chemotherapy and radiation therapy   Primary site: Colon and Rectum   Staging method: AJCC 7th Edition   Clinical: Stage I (T2, N0, M0) signed by Artis Delay, MD on 12/28/2013  1:49 PM   Pathologic: Stage I (T2, N0, cM0) signed by Artis Delay, MD on 12/28/2013  1:49 PM   Summary: Stage I (T2, N0, cM0)     History of rectal cancer  11/17/2009 Procedure   Colonoscopy and rectal biopsy confirmed moderately differentiated adenocarcinoma   11/18/2009 Imaging   Transrectal ultrasound place staging T3 lesion   11/18/2009 Imaging   Staging CT scan of the chest, abdomen and pelvis showed multiple lesions in the liver as well as paraspinal mass of unknown etiology   12/05/2009 - 04/03/2010 Chemotherapy   The patient completed new adjuvant chemotherapy with 5-FU and radiation therapy   12/14/2009 Imaging   PET CT scan show faint metabolic activity in the paraspinal mass with no activity in the liver   04/24/2010 Surgery   She had surgical resection with negative margins. Final pathology was T2, N0, M0 (down staged by chemoradiation therapy from T3, N0, M0)   11/19/2012  Imaging   MRI of the liver confirmed benign hemangioma   01/08/2014 Procedure   Colonoscopy and biopsy was negative   12/21/2014 Imaging   CT scan showed no recurrence of colon cancer. Incidentally, paraspinal mass is slightly larger.   10/04/2016 Procedure   She had repeat colonoscopy which showed two 3 mm polyps in the transverse colon, removed with a cold snare. Resected and retrieved. The examination was otherwise normal on direct and retroflexion views.   10/04/2016 Pathology Results   Surgical [P], transverse, polyp(s) - SERRATED POLYP WITH FOCAL FEATURES SUGGESTIVE OF SESSILE SERRATED POLYP/ADENOMA, ONE FRAGMENT. - HYPERPLASTIC POLYP WITHOUT DYSPLASIA, ONE FRAGMENT. - BENIGN COLORECTAL MUCOSA WITHOUT DYSPLASIA, ONE FRAGMENT. - SEE COMMENT. Microscopic Comment After routine specimen processing, three fragments are identified. One of the fragments demonstrates a serrated horizontally situated crypt, which is suggestive of superficial sampling of a sessile serrated polyp/adenoma (the differential includes another fragment of hyperplastic polyp). Although this is the case, the finding is very focal and not definitive.    11/09/2022 Genetic Testing   Negative genetics for [facility] Multi-Cancer +RNA Panel.  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 Report date is 11/09/2022.   The Multi-Cancer + RNA Panel offered by [facility] includes sequencing and/or deletion/duplication analysis of the following 70 genes:  AIP*, ALK, APC*, ATM*, AXIN2*, BAP1*, BARD1*, BLM*, BMPR1A*, BRCA1*, BRCA2*, BRIP1*, CDC73*, CDH1*, CDK4, CDKN1B*, CDKN2A, CHEK2*, CTNNA1*, DICER1*, EPCAM (del/dup only), EGFR, FH*, FLCN*, GREM1 (promoter dup only), HOXB13, KIT, LZTR1, MAX*, MBD4, MEN1*, MET, MITF, MLH1*, MSH2*, MSH3*, MSH6*, MUTYH*, NF1*, NF2*, NTHL1*, PALB2*, PDGFRA, PMS2*, POLD1*, POLE*,  POT1*, PRKAR1A*, PTCH1*, PTEN*, RAD51C*, RAD51D*, RB1*, RET, SDHA* (sequencing only), SDHAF2*, SDHB*, SDHC*, SDHD*,  SMAD4*, SMARCA4*, SMARCB1*, SMARCE1*, STK11*, SUFU*, TMEM127*, TP53*, TSC1*, TSC2*, VHL*. RNA analysis is performed for * genes.    11/09/2022 Genetic Testing   Negative genetics for [facility] Multi-Cancer +RNA Panel.  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 Report date is 11/09/2022.   The Multi-Cancer + RNA Panel offered by [facility] includes sequencing and/or deletion/duplication analysis of the following 70 genes:  AIP*, ALK, APC*, ATM*, AXIN2*, BAP1*, BARD1*, BLM*, BMPR1A*, BRCA1*, BRCA2*, BRIP1*, CDC73*, CDH1*, CDK4, CDKN1B*, CDKN2A, CHEK2*, CTNNA1*, DICER1*, EPCAM (del/dup only), EGFR, FH*, FLCN*, GREM1 (promoter dup only), HOXB13, KIT, LZTR1, MAX*, MBD4, MEN1*, MET, MITF, MLH1*, MSH2*, MSH3*, MSH6*, MUTYH*, NF1*, NF2*, NTHL1*, PALB2*, PDGFRA, PMS2*, POLD1*, POLE*, POT1*, PRKAR1A*, PTCH1*, PTEN*, RAD51C*, RAD51D*, RB1*, RET, SDHA* (sequencing only), SDHAF2*, SDHB*, SDHC*, SDHD*, SMAD4*, SMARCA4*, SMARCB1*, SMARCE1*, STK11*, SUFU*, TMEM127*, TP53*, TSC1*, TSC2*, VHL*. RNA analysis is performed for * genes.    Malignant neoplasm of upper-outer quadrant of left breast in female, estrogen receptor positive (HCC)  10/17/2022 Initial Diagnosis   Diffuse bilateral breast pains.  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  Variant of uncertain significance in KIT at  c.839C>T (p.Ala280Val). Report date is 11/09/2022.   The Multi-Cancer + RNA Panel offered by [facility] includes sequencing and/or deletion/duplication analysis of the following 70 genes:  AIP*, ALK, APC*, ATM*, AXIN2*, BAP1*, BARD1*, BLM*, BMPR1A*, BRCA1*, BRCA2*, BRIP1*, CDC73*, CDH1*, CDK4, CDKN1B*, CDKN2A, CHEK2*, CTNNA1*, DICER1*, EPCAM (del/dup only), EGFR, FH*, FLCN*, GREM1 (promoter dup only), HOXB13, KIT, LZTR1, MAX*, MBD4, MEN1*, MET, MITF, MLH1*, MSH2*, MSH3*, MSH6*, MUTYH*, NF1*, NF2*, NTHL1*, PALB2*, PDGFRA, PMS2*, POLD1*, POLE*, POT1*, PRKAR1A*, PTCH1*, PTEN*, RAD51C*, RAD51D*, RB1*, RET, SDHA* (sequencing only), SDHAF2*, SDHB*, SDHC*, SDHD*, SMAD4*, SMARCA4*, SMARCB1*, SMARCE1*, STK11*, SUFU*, TMEM127*, TP53*, TSC1*, TSC2*, VHL*. RNA analysis is performed for * genes.    11/14/2022 Surgery   Left lumpectomy: Grade 2 ILC with LCIS 3.5 cm, 1/8 lymph node with micrometastases ER 100%, PR 40%, Ki-67 20%, HER2 3+ positive; lymph node prognostic panel: ER 100% PR 20% Ki-67 10%, HER2 2+ by IHC, FISH negative ratio 1.14   06/04/2023 - 07/23/2023 Radiation Therapy   Plan Name: Breast_L_BH Site: Breast, Left Technique: 3D Mode: Photon Dose Per Fraction: 1.8 Gy Prescribed Dose (Delivered / Prescribed): 50.4 Gy / 50.4 Gy Prescribed Fxs (Delivered / Prescribed): 28 / 28   Plan Name: Brst_L_Scv_BH Site: Breast, Left Technique: 3D Mode: Photon Dose Per Fraction: 1.8 Gy Prescribed Dose (Delivered / Prescribed): 50.4 Gy / 50.4 Gy Prescribed Fxs (Delivered / Prescribed): 28 / 28   Plan Name: Brst_L_Bst_BH Site: Breast, Left Technique: 3D Mode: Photon Dose Per Fraction: 2 Gy Prescribed Dose (Delivered / Prescribed): 12 Gy / 12 Gy Prescribed Fxs (Delivered / Prescribed): 6 / 6   Malignant neoplasm of lower-outer quadrant of left breast of  female, estrogen receptor positive (HCC)  10/30/2022 Initial Diagnosis   Malignant neoplasm of lower-outer quadrant of left breast of female, estrogen  receptor positive (HCC)   12/27/2022 -  Chemotherapy   Patient is on Treatment Plan : BREAST Docetaxel + Carboplatin + Trastuzumab (TCH) q21d / Trastuzumab q21d       CHIEF COMPLIANT: Herceptin maintenance therapy, anastrozole   History of Present Illness   The patient, with a history of breast cancer, recently completed radiation and chemotherapy. She reports that her breast is healing well from the radiation, with no skin breakdown, but occasional numbness. She has been diligent with her exercises to manage this.  She started hormone therapy with anastrozole at the beginning of the month, and so far, she has experienced minimal side effects. She reports an occasional hot flash and possible joint stiffness, but she is still adjusting to the medication.  She is also on Herceptin, which has been causing diarrhea when she consumes certain foods, such as dairy and lettuce.  In addition, she had a chest x-ray in August due to some chest pain, which has since resolved. Her white blood cell count and hemoglobin levels are slowly improving after her treatments.      ALLERGIES:  is allergic to norgesic [orphenadrine-aspirin-caffeine], hydrocodone, oxycodone, and antibacterial hand soap [triclosan].  MEDICATIONS:  Current Outpatient Medications  Medication Sig Dispense Refill   amLODipine (NORVASC) 10 MG tablet Take 10 mg by mouth at bedtime.      anastrozole (ARIMIDEX) 1 MG tablet Take 1 tablet (1 mg total) by mouth daily. 90 tablet 3   Aromatic Inhalants (VICKS VAPOINHALER IN) Inhale 1 puff into the lungs daily as needed (congestion).     atorvastatin (LIPITOR) 10 MG tablet Take 10 mg by mouth daily.     doxazosin (CARDURA) 2 MG tablet      empagliflozin (JARDIANCE) 25 MG TABS tablet Take 25 mg by mouth daily with supper.      ibuprofen (ADVIL) 800 MG tablet Take 800 mg by mouth every 8 (eight) hours as needed for moderate pain.     latanoprost (XALATAN) 0.005 % ophthalmic solution Place 1  drop into both eyes at bedtime.      lidocaine-prilocaine (EMLA) cream Apply to affected area once 30 g 3   loperamide (IMODIUM A-D) 2 MG tablet Take 2 mg by mouth 4 (four) times daily as needed for diarrhea or loose stools.     olmesartan (BENICAR) 40 MG tablet Take 40 mg by mouth daily.     omeprazole (PRILOSEC) 40 MG capsule Take 1 capsule (40 mg total) by mouth daily. (Patient taking differently: Take 40 mg by mouth daily. PRN) 90 capsule 1   oxymetazoline (AFRIN) 0.05 % nasal spray Place 1 spray into both nostrils 2 (two) times daily.     prochlorperazine (COMPAZINE) 10 MG tablet Take 1 tablet (10 mg total) by mouth every 6 (six) hours as needed for nausea or vomiting. 30 tablet 1   rivaroxaban (XARELTO) 20 MG TABS tablet Take 1 tablet (20 mg total) by mouth daily with supper. 90 tablet 3   Semaglutide, 1 MG/DOSE, 2 MG/1.5ML SOPN Inject 1 mg into the skin every Sunday.     spironolactone (ALDACTONE) 25 MG tablet Take 50 mg by mouth daily.     vortioxetine HBr (TRINTELLIX) 10 MG TABS tablet Take 10 mg by mouth daily with supper.     No current facility-administered medications for this visit.    PHYSICAL EXAMINATION: ECOG PERFORMANCE STATUS:  1 - Symptomatic but completely ambulatory  Vitals:   09/05/23 0830  BP: (!) 160/75  Pulse: 77  Resp: 18  Temp: 98 F (36.7 C)  SpO2: 97%   Filed Weights   09/05/23 0830  Weight: 187 lb 9.6 oz (85.1 kg)      LABORATORY DATA:  I have reviewed the data as listed    Latest Ref Rng & Units 07/25/2023    8:18 AM 06/13/2023    7:48 AM 05/02/2023    8:42 AM  CMP  Glucose 70 - 99 mg/dL 161  096  045   BUN 8 - 23 mg/dL 19  16  11    Creatinine 0.44 - 1.00 mg/dL 4.09  8.11  9.14   Sodium 135 - 145 mmol/L 137  138  139   Potassium 3.5 - 5.1 mmol/L 4.1  4.3  4.0   Chloride 98 - 111 mmol/L 104  105  107   CO2 22 - 32 mmol/L 24  24  26    Calcium 8.9 - 10.3 mg/dL 8.9  9.2  9.2   Total Protein 6.5 - 8.1 g/dL 7.2  7.4  7.4   Total Bilirubin 0.3 -  1.2 mg/dL 0.6  0.2  0.4   Alkaline Phos 38 - 126 U/L 83  97  98   AST 15 - 41 U/L 15  13  13    ALT 0 - 44 U/L 17  13  14      Lab Results  Component Value Date   WBC 3.3 (L) 09/05/2023   HGB 10.9 (L) 09/05/2023   HCT 33.4 (L) 09/05/2023   MCV 89.3 09/05/2023   PLT 206 09/05/2023   NEUTROABS 2.2 09/05/2023    ASSESSMENT & PLAN:  Malignant neoplasm of upper-outer quadrant of left breast in female, estrogen receptor positive (HCC) 10/17/2022 Diffuse bilateral breast pains.  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  Mild hot flash and slight joint stiffness  Return to infusion every 3 weeks for Herceptin maintenance every 6 weeks follow-up with me. Echocardiogram will be done at the end of October.  No orders of the defined types were placed in this encounter.  The patient has a good understanding of the  overall plan. she agrees with it. she will call with any problems that may develop before the next visit here. Total time spent: 30 mins including face to face time and time spent for planning, charting and co-ordination of care   Tamsen Meek, MD 09/05/23

## 2023-09-05 NOTE — Assessment & Plan Note (Addendum)
10/17/2022 Diffuse bilateral breast pains.  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  Mild hot flash and slight joint stiffness  Return to infusion every 3 weeks for Herceptin maintenance every 6 weeks follow-up with me. Echocardiogram will be done at the end of October.

## 2023-09-05 NOTE — Patient Instructions (Signed)
[facility]  Discharge Instructions: Thank you for choosing [facility] to provide your oncology and hematology care.   If you have a lab appointment with the Cancer Center, please go directly to the Cancer Center and check in at the registration area.   Wear comfortable clothing and clothing appropriate for easy access to any Portacath or PICC line.   We strive to give you quality time with your provider. You may need to reschedule your appointment if you arrive late (15 or more minutes).  Arriving late affects you and other patients whose appointments are after yours.  Also, if you miss three or more appointments without notifying the office, you may be dismissed from the clinic at the provider's discretion.      For prescription refill requests, have your pharmacy contact our office and allow 72 hours for refills to be completed.    Today you received the following chemotherapy and/or immunotherapy agents: trastuzumab-anns      To help prevent nausea and vomiting after your treatment, we encourage you to take your nausea medication as directed.  BELOW ARE SYMPTOMS THAT SHOULD BE REPORTED IMMEDIATELY: *FEVER GREATER THAN 100.4 F (38 C) OR HIGHER *CHILLS OR SWEATING *NAUSEA AND VOMITING THAT IS NOT CONTROLLED WITH YOUR NAUSEA MEDICATION *UNUSUAL SHORTNESS OF BREATH *UNUSUAL BRUISING OR BLEEDING *URINARY PROBLEMS (pain or burning when urinating, or frequent urination) *BOWEL PROBLEMS (unusual diarrhea, constipation, pain near the anus) TENDERNESS IN MOUTH AND THROAT WITH OR WITHOUT PRESENCE OF ULCERS (sore throat, sores in mouth, or a toothache) UNUSUAL RASH, SWELLING OR PAIN  UNUSUAL VAGINAL DISCHARGE OR ITCHING   Items with * indicate a potential emergency and should be followed up as soon as possible or go to the Emergency Department if any problems should occur.  Please show the CHEMOTHERAPY ALERT CARD or IMMUNOTHERAPY ALERT CARD  at check-in to the Emergency Department and triage nurse.  Should you have questions after your visit or need to cancel or reschedule your appointment, please contact [facility]  Dept: [phone]  and follow the prompts.  Office hours are 8:00 a.m. to 4:30 p.m. Monday - Friday. Please note that voicemails left after 4:00 p.m. may not be returned until the following business day.  We are closed weekends and major holidays. You have access to a nurse at all times for urgent questions. Please call the main number to the clinic Dept: [phone] and follow the prompts.   For any non-urgent questions, you may also contact your provider using MyChart. We now offer e-Visits for anyone 18 and older to request care online for non-urgent symptoms. For details visit [email or web address].   Also download the MyChart app! Go to the app store, search "MyChart", open the app, select [facility], and log in with your MyChart username and password.   

## 2023-09-06 ENCOUNTER — Other Ambulatory Visit: Payer: Self-pay

## 2023-09-17 DIAGNOSIS — H40023 Open angle with borderline findings, high risk, bilateral: Secondary | ICD-10-CM | POA: Diagnosis not present

## 2023-09-25 ENCOUNTER — Encounter: Payer: Self-pay | Admitting: *Deleted

## 2023-09-26 ENCOUNTER — Inpatient Hospital Stay: Payer: Medicare HMO

## 2023-09-26 ENCOUNTER — Other Ambulatory Visit: Payer: Self-pay

## 2023-09-26 VITALS — BP 134/60 | HR 77 | Temp 98.3°F | Resp 16 | Wt 182.8 lb

## 2023-09-26 DIAGNOSIS — C50512 Malignant neoplasm of lower-outer quadrant of left female breast: Secondary | ICD-10-CM

## 2023-09-26 DIAGNOSIS — Z5111 Encounter for antineoplastic chemotherapy: Secondary | ICD-10-CM | POA: Diagnosis not present

## 2023-09-26 MED ORDER — HEPARIN SOD (PORK) LOCK FLUSH 100 UNIT/ML IV SOLN
500.0000 [IU] | Freq: Once | INTRAVENOUS | Status: AC | PRN
  Administered 2023-09-26: 500 [IU]

## 2023-09-26 MED ORDER — ACETAMINOPHEN 325 MG PO TABS
650.0000 mg | ORAL_TABLET | Freq: Once | ORAL | Status: AC
  Administered 2023-09-26: 650 mg via ORAL
  Filled 2023-09-26: qty 2

## 2023-09-26 MED ORDER — SODIUM CHLORIDE 0.9% FLUSH
10.0000 mL | INTRAVENOUS | Status: DC | PRN
  Administered 2023-09-26: 10 mL

## 2023-09-26 MED ORDER — SODIUM CHLORIDE 0.9 % IV SOLN: Freq: Once | INTRAVENOUS | Status: AC

## 2023-09-26 MED ORDER — TRASTUZUMAB-ANNS CHEMO 150 MG IV SOLR
6.0000 mg/kg | Freq: Once | INTRAVENOUS | Status: AC
  Administered 2023-09-26: 525 mg via INTRAVENOUS
  Filled 2023-09-26: qty 25

## 2023-09-26 MED ORDER — DIPHENHYDRAMINE HCL 25 MG PO CAPS
50.0000 mg | ORAL_CAPSULE | Freq: Once | ORAL | Status: AC
  Administered 2023-09-26: 50 mg via ORAL
  Filled 2023-09-26: qty 2

## 2023-09-26 NOTE — Patient Instructions (Signed)
[facility]  Discharge Instructions: Thank you for choosing [facility] to provide your oncology and hematology care.   If you have a lab appointment with the Cancer Center, please go directly to the Cancer Center and check in at the registration area.   Wear comfortable clothing and clothing appropriate for easy access to any Portacath or PICC line.   We strive to give you quality time with your provider. You may need to reschedule your appointment if you arrive late (15 or more minutes).  Arriving late affects you and other patients whose appointments are after yours.  Also, if you miss three or more appointments without notifying the office, you may be dismissed from the clinic at the provider's discretion.      For prescription refill requests, have your pharmacy contact our office and allow 72 hours for refills to be completed.    Today you received the following chemotherapy and/or immunotherapy agents: Kanjinti.      To help prevent nausea and vomiting after your treatment, we encourage you to take your nausea medication as directed.  BELOW ARE SYMPTOMS THAT SHOULD BE REPORTED IMMEDIATELY: *FEVER GREATER THAN 100.4 F (38 C) OR HIGHER *CHILLS OR SWEATING *NAUSEA AND VOMITING THAT IS NOT CONTROLLED WITH YOUR NAUSEA MEDICATION *UNUSUAL SHORTNESS OF BREATH *UNUSUAL BRUISING OR BLEEDING *URINARY PROBLEMS (pain or burning when urinating, or frequent urination) *BOWEL PROBLEMS (unusual diarrhea, constipation, pain near the anus) TENDERNESS IN MOUTH AND THROAT WITH OR WITHOUT PRESENCE OF ULCERS (sore throat, sores in mouth, or a toothache) UNUSUAL RASH, SWELLING OR PAIN  UNUSUAL VAGINAL DISCHARGE OR ITCHING   Items with * indicate a potential emergency and should be followed up as soon as possible or go to the Emergency Department if any problems should occur.  Please show the CHEMOTHERAPY ALERT CARD or IMMUNOTHERAPY ALERT CARD at  check-in to the Emergency Department and triage nurse.  Should you have questions after your visit or need to cancel or reschedule your appointment, please contact [facility]  Dept: [phone]  and follow the prompts.  Office hours are 8:00 a.m. to 4:30 p.m. Monday - Friday. Please note that voicemails left after 4:00 p.m. may not be returned until the following business day.  We are closed weekends and major holidays. You have access to a nurse at all times for urgent questions. Please call the main number to the clinic Dept: [phone] and follow the prompts.   For any non-urgent questions, you may also contact your provider using MyChart. We now offer e-Visits for anyone 18 and older to request care online for non-urgent symptoms. For details visit [email or web address].   Also download the MyChart app! Go to the app store, search "MyChart", open the app, select [facility], and log in with your MyChart username and password.   

## 2023-09-26 NOTE — Progress Notes (Signed)
Per Dr. Pamelia Hoit OK to proceed with ECHO from 07/01/2023, Pt scheduled for updated ECHO to be obtained on 09/30/2023.

## 2023-09-30 ENCOUNTER — Ambulatory Visit (HOSPITAL_COMMUNITY)
Admission: RE | Admit: 2023-09-30 | Discharge: 2023-09-30 | Disposition: A | Discharge status: Home / Self Care | Payer: Medicare HMO | Source: Ambulatory Visit | Attending: Hematology and Oncology

## 2023-09-30 DIAGNOSIS — E119 Type 2 diabetes mellitus without complications: Secondary | ICD-10-CM | POA: Diagnosis not present

## 2023-09-30 DIAGNOSIS — T451X5A Adverse effect of antineoplastic and immunosuppressive drugs, initial encounter: Secondary | ICD-10-CM | POA: Insufficient documentation

## 2023-09-30 DIAGNOSIS — I427 Cardiomyopathy due to drug and external agent: Secondary | ICD-10-CM | POA: Diagnosis not present

## 2023-09-30 DIAGNOSIS — Z170 Estrogen receptor positive status [ER+]: Secondary | ICD-10-CM | POA: Insufficient documentation

## 2023-09-30 DIAGNOSIS — I10 Essential (primary) hypertension: Secondary | ICD-10-CM | POA: Diagnosis not present

## 2023-09-30 DIAGNOSIS — Z0189 Encounter for other specified special examinations: Secondary | ICD-10-CM

## 2023-09-30 DIAGNOSIS — C50412 Malignant neoplasm of upper-outer quadrant of left female breast: Secondary | ICD-10-CM | POA: Insufficient documentation

## 2023-09-30 DIAGNOSIS — C50512 Malignant neoplasm of lower-outer quadrant of left female breast: Secondary | ICD-10-CM | POA: Diagnosis not present

## 2023-09-30 LAB — ECHOCARDIOGRAM COMPLETE
AR max vel: 1.63 cm2
AV Area VTI: 1.43 cm2
AV Area mean vel: 1.49 cm2
AV Mean grad: 5 mm[Hg]
AV Peak grad: 8.8 mm[Hg]
Ao pk vel: 1.48 m/s
Area-P 1/2: 2.85 cm2
S' Lateral: 2.5 cm

## 2023-10-11 DIAGNOSIS — I10 Essential (primary) hypertension: Secondary | ICD-10-CM | POA: Diagnosis not present

## 2023-10-11 DIAGNOSIS — C50412 Malignant neoplasm of upper-outer quadrant of left female breast: Secondary | ICD-10-CM | POA: Diagnosis not present

## 2023-10-11 DIAGNOSIS — H838X9 Other specified diseases of inner ear, unspecified ear: Secondary | ICD-10-CM | POA: Diagnosis not present

## 2023-10-11 DIAGNOSIS — Z6832 Body mass index (BMI) 32.0-32.9, adult: Secondary | ICD-10-CM | POA: Diagnosis not present

## 2023-10-11 DIAGNOSIS — E1169 Type 2 diabetes mellitus with other specified complication: Secondary | ICD-10-CM | POA: Diagnosis not present

## 2023-10-11 DIAGNOSIS — C50512 Malignant neoplasm of lower-outer quadrant of left female breast: Secondary | ICD-10-CM | POA: Diagnosis not present

## 2023-10-17 ENCOUNTER — Inpatient Hospital Stay: Payer: Medicare HMO | Attending: Hematology and Oncology

## 2023-10-17 ENCOUNTER — Inpatient Hospital Stay: Payer: Medicare HMO | Admitting: Hematology and Oncology

## 2023-10-17 ENCOUNTER — Inpatient Hospital Stay: Payer: Medicare HMO

## 2023-10-17 VITALS — BP 135/63 | HR 76 | Temp 97.9°F | Resp 18

## 2023-10-17 VITALS — BP 130/62 | HR 84 | Temp 97.8°F | Resp 18 | Ht 63.0 in | Wt 183.8 lb

## 2023-10-17 DIAGNOSIS — Z95828 Presence of other vascular implants and grafts: Secondary | ICD-10-CM

## 2023-10-17 DIAGNOSIS — Z79811 Long term (current) use of aromatase inhibitors: Secondary | ICD-10-CM | POA: Diagnosis not present

## 2023-10-17 DIAGNOSIS — Z170 Estrogen receptor positive status [ER+]: Secondary | ICD-10-CM | POA: Diagnosis not present

## 2023-10-17 DIAGNOSIS — Z79899 Other long term (current) drug therapy: Secondary | ICD-10-CM | POA: Insufficient documentation

## 2023-10-17 DIAGNOSIS — C50512 Malignant neoplasm of lower-outer quadrant of left female breast: Secondary | ICD-10-CM | POA: Insufficient documentation

## 2023-10-17 DIAGNOSIS — Z5111 Encounter for antineoplastic chemotherapy: Secondary | ICD-10-CM | POA: Insufficient documentation

## 2023-10-17 DIAGNOSIS — C50412 Malignant neoplasm of upper-outer quadrant of left female breast: Secondary | ICD-10-CM | POA: Diagnosis not present

## 2023-10-17 DIAGNOSIS — C20 Malignant neoplasm of rectum: Secondary | ICD-10-CM

## 2023-10-17 DIAGNOSIS — Z85048 Personal history of other malignant neoplasm of rectum, rectosigmoid junction, and anus: Secondary | ICD-10-CM | POA: Diagnosis not present

## 2023-10-17 DIAGNOSIS — Z923 Personal history of irradiation: Secondary | ICD-10-CM | POA: Diagnosis not present

## 2023-10-17 LAB — CBC WITH DIFFERENTIAL (CANCER CENTER ONLY)
Abs Immature Granulocytes: 0.02 10*3/uL (ref 0.00–0.07)
Basophils Absolute: 0 10*3/uL (ref 0.0–0.1)
Basophils Relative: 1 %
Eosinophils Absolute: 0.1 10*3/uL (ref 0.0–0.5)
Eosinophils Relative: 3 %
HCT: 36.7 % (ref 36.0–46.0)
Hemoglobin: 11.6 g/dL — ABNORMAL LOW (ref 12.0–15.0)
Immature Granulocytes: 1 %
Lymphocytes Relative: 16 %
Lymphs Abs: 0.7 10*3/uL (ref 0.7–4.0)
MCH: 28 pg (ref 26.0–34.0)
MCHC: 31.6 g/dL (ref 30.0–36.0)
MCV: 88.6 fL (ref 80.0–100.0)
Monocytes Absolute: 0.5 10*3/uL (ref 0.1–1.0)
Monocytes Relative: 13 %
Neutro Abs: 2.9 10*3/uL (ref 1.7–7.7)
Neutrophils Relative %: 66 %
Platelet Count: 228 10*3/uL (ref 150–400)
RBC: 4.14 MIL/uL (ref 3.87–5.11)
RDW: 15.5 % (ref 11.5–15.5)
WBC Count: 4.3 10*3/uL (ref 4.0–10.5)
nRBC: 0 % (ref 0.0–0.2)

## 2023-10-17 LAB — CMP (CANCER CENTER ONLY)
ALT: 17 U/L (ref 0–44)
AST: 14 U/L — ABNORMAL LOW (ref 15–41)
Albumin: 4.1 g/dL (ref 3.5–5.0)
Alkaline Phosphatase: 89 U/L (ref 38–126)
Anion gap: 8 (ref 5–15)
BUN: 23 mg/dL (ref 8–23)
CO2: 27 mmol/L (ref 22–32)
Calcium: 9.6 mg/dL (ref 8.9–10.3)
Chloride: 107 mmol/L (ref 98–111)
Creatinine: 1.49 mg/dL — ABNORMAL HIGH (ref 0.44–1.00)
GFR, Estimated: 38 mL/min — ABNORMAL LOW (ref >60)
Glucose, Bld: 117 mg/dL — ABNORMAL HIGH (ref 70–99)
Potassium: 4.4 mmol/L (ref 3.5–5.1)
Sodium: 142 mmol/L (ref 135–145)
Total Bilirubin: 0.3 mg/dL (ref <1.2)
Total Protein: 7.5 g/dL (ref 6.5–8.1)

## 2023-10-17 MED ORDER — DIPHENHYDRAMINE HCL 25 MG PO CAPS
50.0000 mg | ORAL_CAPSULE | Freq: Once | ORAL | Status: AC
  Administered 2023-10-17: 50 mg via ORAL
  Filled 2023-10-17: qty 2

## 2023-10-17 MED ORDER — SODIUM CHLORIDE 0.9 % IV SOLN: Freq: Once | INTRAVENOUS | Status: AC

## 2023-10-17 MED ORDER — ACETAMINOPHEN 325 MG PO TABS
650.0000 mg | ORAL_TABLET | Freq: Once | ORAL | Status: AC
  Administered 2023-10-17: 650 mg via ORAL
  Filled 2023-10-17: qty 2

## 2023-10-17 MED ORDER — HEPARIN SOD (PORK) LOCK FLUSH 100 UNIT/ML IV SOLN
500.0000 [IU] | Freq: Once | INTRAVENOUS | Status: AC | PRN
  Administered 2023-10-17: 500 [IU]

## 2023-10-17 MED ORDER — TRASTUZUMAB-ANNS CHEMO 150 MG IV SOLR
6.0000 mg/kg | Freq: Once | INTRAVENOUS | Status: AC
  Administered 2023-10-17: 525 mg via INTRAVENOUS
  Filled 2023-10-17: qty 25

## 2023-10-17 MED ORDER — SODIUM CHLORIDE 0.9% FLUSH
10.0000 mL | INTRAVENOUS | Status: DC | PRN
  Administered 2023-10-17: 10 mL via INTRAVENOUS

## 2023-10-17 MED ORDER — SODIUM CHLORIDE 0.9% FLUSH
10.0000 mL | INTRAVENOUS | Status: DC | PRN
  Administered 2023-10-17: 10 mL

## 2023-10-17 NOTE — Assessment & Plan Note (Addendum)
10/17/2022 Diffuse bilateral breast pains.  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  Mild hot flash and slight joint stiffness Echocardiogram 09/30/2023: EF 55 to 60%  Return to infusion every 3 weeks for Herceptin maintenance every 6 weeks follow-up with me.  Last Herceptin 12/19/2023

## 2023-10-17 NOTE — Patient Instructions (Signed)
[facility] - A DEPT OF [facility]  Discharge Instructions: Thank you for choosing [facility] to provide your oncology and hematology care.   If you have a lab appointment with the Cancer Center, please go directly to the Cancer Center and check in at the registration area.   Wear comfortable clothing and clothing appropriate for easy access to any Portacath or PICC line.   We strive to give you quality time with your provider. You may need to reschedule your appointment if you arrive late (15 or more minutes).  Arriving late affects you and other patients whose appointments are after yours.  Also, if you miss three or more appointments without notifying the office, you may be dismissed from the clinic at the provider's discretion.      For prescription refill requests, have your pharmacy contact our office and allow 72 hours for refills to be completed.    Today you received the following chemotherapy and/or immunotherapy agents: Kanjinti      To help prevent nausea and vomiting after your treatment, we encourage you to take your nausea medication as directed.  BELOW ARE SYMPTOMS THAT SHOULD BE REPORTED IMMEDIATELY: *FEVER GREATER THAN 100.4 F (38 C) OR HIGHER *CHILLS OR SWEATING *NAUSEA AND VOMITING THAT IS NOT CONTROLLED WITH YOUR NAUSEA MEDICATION *UNUSUAL SHORTNESS OF BREATH *UNUSUAL BRUISING OR BLEEDING *URINARY PROBLEMS (pain or burning when urinating, or frequent urination) *BOWEL PROBLEMS (unusual diarrhea, constipation, pain near the anus) TENDERNESS IN MOUTH AND THROAT WITH OR WITHOUT PRESENCE OF ULCERS (sore throat, sores in mouth, or a toothache) UNUSUAL RASH, SWELLING OR PAIN  UNUSUAL VAGINAL DISCHARGE OR ITCHING   Items with * indicate a potential emergency and should be followed up as soon as possible or go to the Emergency Department if any problems should occur.  Please show the CHEMOTHERAPY ALERT CARD or IMMUNOTHERAPY  ALERT CARD at check-in to the Emergency Department and triage nurse.  Should you have questions after your visit or need to cancel or reschedule your appointment, please contact [facility] - A DEPT OF [facility]  Dept: [phone]  and follow the prompts.  Office hours are 8:00 a.m. to 4:30 p.m. Monday - Friday. Please note that voicemails left after 4:00 p.m. may not be returned until the following business day.  We are closed weekends and major holidays. You have access to a nurse at all times for urgent questions. Please call the main number to the clinic Dept: [phone] and follow the prompts.   For any non-urgent questions, you may also contact your provider using MyChart. We now offer e-Visits for anyone 29 and older to request care online for non-urgent symptoms. For details visit [email or web address].   Also download the MyChart app! Go to the app store, search "MyChart", open the app, select [place], and log in with your MyChart username and password.

## 2023-10-17 NOTE — Progress Notes (Signed)
Patient Care Team: Renaye Rakers, MD as PCP - General (Family Medicine) Artis Delay, MD as Consulting Physician (Hematology and Oncology) Donnelly Angelica, RN as Oncology Nurse Navigator Pershing Proud, RN as Oncology Nurse Navigator [name], MD as Consulting Physician (Hematology and Oncology) Abigail Miyamoto, MD as Consulting Physician (General Surgery) Antony Blackbird, MD as Consulting Physician (Radiation Oncology)  DIAGNOSIS:  Encounter Diagnosis  Name Primary?   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 after neoadjuvant chemotherapy and radiation therapy   Primary site: Colon and Rectum   Staging method: AJCC 7th Edition   Clinical: Stage I (T2, N0, M0) signed by Artis Delay, MD on 12/28/2013  1:49 PM   Pathologic: Stage I (T2, N0, cM0) signed by Artis Delay, MD on 12/28/2013  1:49 PM   Summary: Stage I (T2, N0, cM0)     History of rectal cancer  11/17/2009 Procedure   Colonoscopy and rectal biopsy confirmed moderately differentiated adenocarcinoma   11/18/2009 Imaging   Transrectal ultrasound place staging T3 lesion   11/18/2009 Imaging   Staging CT scan of the chest, abdomen and pelvis showed multiple lesions in the liver as well as paraspinal mass of unknown etiology   12/05/2009 - 04/03/2010 Chemotherapy   The patient completed new adjuvant chemotherapy with 5-FU and radiation therapy   12/14/2009 Imaging   PET CT scan show faint metabolic activity in the paraspinal mass with no activity in the liver   04/24/2010 Surgery   She had surgical resection with negative margins. Final pathology was T2, N0, M0 (down staged by chemoradiation therapy from T3, N0, M0)   11/19/2012 Imaging   MRI of the liver confirmed benign hemangioma   01/08/2014 Procedure   Colonoscopy and biopsy  was negative   12/21/2014 Imaging   CT scan showed no recurrence of colon cancer. Incidentally, paraspinal mass is slightly larger.   10/04/2016 Procedure   She had repeat colonoscopy which showed two 3 mm polyps in the transverse colon, removed with a cold snare. Resected and retrieved. The examination was otherwise normal on direct and retroflexion views.   10/04/2016 Pathology Results   Surgical [P], transverse, polyp(s) - SERRATED POLYP WITH FOCAL FEATURES SUGGESTIVE OF SESSILE SERRATED POLYP/ADENOMA, ONE FRAGMENT. - HYPERPLASTIC POLYP WITHOUT DYSPLASIA, ONE FRAGMENT. - BENIGN COLORECTAL MUCOSA WITHOUT DYSPLASIA, ONE FRAGMENT. - SEE COMMENT. Microscopic Comment After routine specimen processing, three fragments are identified. One of the fragments demonstrates a serrated horizontally situated crypt, which is suggestive of superficial sampling of a sessile serrated polyp/adenoma (the differential includes another fragment of hyperplastic polyp). Although this is the case, the finding is very focal and not definitive.    11/09/2022 Genetic Testing   Negative genetics for [facility] Multi-Cancer +RNA Panel.  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 Report date is 11/09/2022.   The Multi-Cancer + RNA Panel offered by [facility] includes sequencing and/or deletion/duplication analysis of the following 70 genes:  AIP*, ALK, APC*, ATM*, AXIN2*, BAP1*, BARD1*, BLM*, BMPR1A*, BRCA1*, BRCA2*, BRIP1*, CDC73*, CDH1*, CDK4, CDKN1B*, CDKN2A, CHEK2*, CTNNA1*, DICER1*, EPCAM (del/dup only), EGFR, FH*, FLCN*, GREM1 (promoter dup only), HOXB13, KIT, LZTR1, MAX*, MBD4, MEN1*, MET, MITF, MLH1*, MSH2*, MSH3*, MSH6*, MUTYH*, NF1*, NF2*, NTHL1*, PALB2*, PDGFRA, PMS2*, POLD1*, POLE*, POT1*, PRKAR1A*, PTCH1*, PTEN*, RAD51C*, RAD51D*, RB1*, RET, SDHA* (sequencing only), SDHAF2*, SDHB*, SDHC*, SDHD*, SMAD4*, SMARCA4*,  SMARCB1*, SMARCE1*, STK11*, SUFU*, TMEM127*, TP53*, TSC1*, TSC2*, VHL*. RNA analysis  is performed for * genes.    11/09/2022 Genetic Testing   Negative genetics for [facility] Multi-Cancer +RNA Panel.  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 Report date is 11/09/2022.   The Multi-Cancer + RNA Panel offered by [facility] includes sequencing and/or deletion/duplication analysis of the following 70 genes:  AIP*, ALK, APC*, ATM*, AXIN2*, BAP1*, BARD1*, BLM*, BMPR1A*, BRCA1*, BRCA2*, BRIP1*, CDC73*, CDH1*, CDK4, CDKN1B*, CDKN2A, CHEK2*, CTNNA1*, DICER1*, EPCAM (del/dup only), EGFR, FH*, FLCN*, GREM1 (promoter dup only), HOXB13, KIT, LZTR1, MAX*, MBD4, MEN1*, MET, MITF, MLH1*, MSH2*, MSH3*, MSH6*, MUTYH*, NF1*, NF2*, NTHL1*, PALB2*, PDGFRA, PMS2*, POLD1*, POLE*, POT1*, PRKAR1A*, PTCH1*, PTEN*, RAD51C*, RAD51D*, RB1*, RET, SDHA* (sequencing only), SDHAF2*, SDHB*, SDHC*, SDHD*, SMAD4*, SMARCA4*, SMARCB1*, SMARCE1*, STK11*, SUFU*, TMEM127*, TP53*, TSC1*, TSC2*, VHL*. RNA analysis is performed for * genes.    Malignant neoplasm of upper-outer quadrant of left breast in female, estrogen receptor positive (HCC)  10/17/2022 Initial Diagnosis   Diffuse bilateral breast pains.  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 Report date is 11/09/2022.   The Multi-Cancer + RNA Panel offered by [facility]  includes sequencing and/or deletion/duplication analysis of the following 70 genes:  AIP*, ALK, APC*, ATM*, AXIN2*, BAP1*, BARD1*, BLM*, BMPR1A*, BRCA1*, BRCA2*, BRIP1*, CDC73*, CDH1*, CDK4, CDKN1B*, CDKN2A, CHEK2*, CTNNA1*, DICER1*, EPCAM (del/dup only), EGFR, FH*, FLCN*, GREM1 (promoter dup only), HOXB13, KIT, LZTR1, MAX*, MBD4, MEN1*, MET, MITF, MLH1*, MSH2*, MSH3*, MSH6*, MUTYH*, NF1*, NF2*, NTHL1*, PALB2*, PDGFRA, PMS2*, POLD1*, POLE*, POT1*, PRKAR1A*, PTCH1*, PTEN*, RAD51C*, RAD51D*, RB1*, RET, SDHA* (sequencing only), SDHAF2*, SDHB*, SDHC*, SDHD*, SMAD4*, SMARCA4*, SMARCB1*, SMARCE1*, STK11*, SUFU*, TMEM127*, TP53*, TSC1*, TSC2*, VHL*. RNA analysis is performed for * genes.    11/14/2022 Surgery   Left lumpectomy: Grade 2 ILC with LCIS 3.5 cm, 1/8 lymph node with micrometastases ER 100%, PR 40%, Ki-67 20%, HER2 3+ positive; lymph node prognostic panel: ER 100% PR 20% Ki-67 10%, HER2 2+ by IHC, FISH negative ratio 1.14   06/04/2023 - 07/23/2023 Radiation Therapy   Plan Name: Breast_L_BH Site: Breast, Left Technique: 3D Mode: Photon Dose Per Fraction: 1.8 Gy Prescribed Dose (Delivered / Prescribed): 50.4 Gy / 50.4 Gy Prescribed Fxs (Delivered / Prescribed): 28 / 28   Plan Name: Brst_L_Scv_BH Site: Breast, Left Technique: 3D Mode: Photon Dose Per Fraction: 1.8 Gy Prescribed Dose (Delivered / Prescribed): 50.4 Gy / 50.4 Gy Prescribed Fxs (Delivered / Prescribed): 28 / 28   Plan Name: Brst_L_Bst_BH Site: Breast, Left Technique: 3D Mode: Photon Dose Per Fraction: 2 Gy Prescribed Dose (Delivered / Prescribed): 12 Gy / 12 Gy Prescribed Fxs (Delivered / Prescribed): 6 / 6   Malignant neoplasm of lower-outer quadrant of left breast of female, estrogen receptor positive (HCC)  10/30/2022 Initial Diagnosis   Malignant neoplasm of lower-outer quadrant of  left breast of female, estrogen receptor positive (HCC)   12/27/2022 -  Chemotherapy   Patient is on Treatment Plan : BREAST Docetaxel +  Carboplatin + Trastuzumab (TCH) q21d / Trastuzumab q21d       CHIEF COMPLIANT: Follow-up on Herceptin and anastrozole    History of Present Illness   The patient, with a H/O breast cancer on Herceptin infusions, reports no issues related to the infusions. She recently had an echocardiogram that was reported as normal. She denies any bowel issues such as diarrhea. The patient's blood work shows improvement with a normal white count and near-normal hemoglobin. Despite this, she reports significant fatigue and a need for regular naps.  Tolerating anastrozole extremely well        ALLERGIES:  is allergic to norgesic [orphenadrine-aspirin-caffeine], hydrocodone, oxycodone, and antibacterial hand soap [triclosan].  MEDICATIONS:  Current Outpatient Medications  Medication Sig Dispense Refill   amLODipine (NORVASC) 10 MG tablet Take 10 mg by mouth at bedtime.      anastrozole (ARIMIDEX) 1 MG tablet Take 1 tablet (1 mg total) by mouth daily. 90 tablet 3   Aromatic Inhalants (VICKS VAPOINHALER IN) Inhale 1 puff into the lungs daily as needed (congestion).     atorvastatin (LIPITOR) 10 MG tablet Take 10 mg by mouth daily.     doxazosin (CARDURA) 2 MG tablet      empagliflozin (JARDIANCE) 25 MG TABS tablet Take 25 mg by mouth daily with supper.      ibuprofen (ADVIL) 800 MG tablet Take 800 mg by mouth every 8 (eight) hours as needed for moderate pain.     latanoprost (XALATAN) 0.005 % ophthalmic solution Place 1 drop into both eyes at bedtime.      lidocaine-prilocaine (EMLA) cream Apply to affected area once 30 g 3   loperamide (IMODIUM A-D) 2 MG tablet Take 2 mg by mouth 4 (four) times daily as needed for diarrhea or loose stools.     olmesartan (BENICAR) 40 MG tablet Take 40 mg by mouth daily.     omeprazole (PRILOSEC) 40 MG capsule Take 1 capsule (40 mg total) by mouth daily. (Patient taking differently: Take 40 mg by mouth daily. PRN) 90 capsule 1   oxymetazoline (AFRIN) 0.05 % nasal spray  Place 1 spray into both nostrils 2 (two) times daily.     prochlorperazine (COMPAZINE) 10 MG tablet Take 1 tablet (10 mg total) by mouth every 6 (six) hours as needed for nausea or vomiting. 30 tablet 1   rivaroxaban (XARELTO) 20 MG TABS tablet Take 1 tablet (20 mg total) by mouth daily with supper. 90 tablet 3   Semaglutide, 1 MG/DOSE, 2 MG/1.5ML SOPN Inject 1 mg into the skin every Sunday.     spironolactone (ALDACTONE) 25 MG tablet Take 50 mg by mouth daily.     vortioxetine HBr (TRINTELLIX) 10 MG TABS tablet Take 10 mg by mouth daily with supper.     No current facility-administered medications for this visit.    PHYSICAL EXAMINATION: ECOG PERFORMANCE STATUS: 1 - Symptomatic but completely ambulatory  Vitals:   10/17/23 0850  BP: 130/62  Pulse: 84  Resp: 18  Temp: 97.8 F (36.6 C)  SpO2: 100%   Filed Weights   10/17/23 0850  Weight: 183 lb 12.8 oz (83.4 kg)      LABORATORY DATA:  I have reviewed the data as listed    Latest Ref Rng & Units 10/17/2023    8:19 AM 09/05/2023    8:16 AM 07/25/2023  8:18 AM  CMP  Glucose 70 - 99 mg/dL 782  956  213   BUN 8 - 23 mg/dL 23  12  19    Creatinine 0.44 - 1.00 mg/dL 0.86  5.78  4.69   Sodium 135 - 145 mmol/L 142  141  137   Potassium 3.5 - 5.1 mmol/L 4.4  3.7  4.1   Chloride 98 - 111 mmol/L 107  108  104   CO2 22 - 32 mmol/L 27  26  24    Calcium 8.9 - 10.3 mg/dL 9.6  9.0  8.9   Total Protein 6.5 - 8.1 g/dL 7.5  7.1  7.2   Total Bilirubin <1.2 mg/dL 0.3  0.5  0.6   Alkaline Phos 38 - 126 U/L 89  92  83   AST 15 - 41 U/L 14  13  15    ALT 0 - 44 U/L 17  12  17      Lab Results  Component Value Date   WBC 4.3 10/17/2023   HGB 11.6 (L) 10/17/2023   HCT 36.7 10/17/2023   MCV 88.6 10/17/2023   PLT 228 10/17/2023   NEUTROABS 2.9 10/17/2023    ASSESSMENT & PLAN:  Malignant neoplasm of upper-outer quadrant of left breast in female, estrogen receptor positive (HCC) 10/17/2022 Diffuse bilateral breast pains.  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  Mild hot flash and slight joint stiffness Echocardiogram 09/30/2023: EF 55 to 60%  Return to infusion every 3 weeks for Herceptin maintenance every 6 weeks follow-up with me.  Last Herceptin 12/19/2023    General Health Maintenance Up to date with COVID and flu vaccinations, planning to receive RSV vaccine. -Continue with planned vaccinations.     No orders of the defined types were placed in this encounter.  The patient has a good understanding of the overall plan. she agrees with it. she will call with any problems that may develop before the next visit here. Total time spent: 30 mins including face to face time and time spent for planning, charting and co-ordination of care   Tamsen Meek, MD 10/17/23

## 2023-11-05 DIAGNOSIS — H02412 Mechanical ptosis of left eyelid: Secondary | ICD-10-CM | POA: Diagnosis not present

## 2023-11-05 DIAGNOSIS — H02422 Myogenic ptosis of left eyelid: Secondary | ICD-10-CM | POA: Diagnosis not present

## 2023-11-05 DIAGNOSIS — H02411 Mechanical ptosis of right eyelid: Secondary | ICD-10-CM | POA: Diagnosis not present

## 2023-11-05 DIAGNOSIS — H02421 Myogenic ptosis of right eyelid: Secondary | ICD-10-CM | POA: Diagnosis not present

## 2023-11-05 DIAGNOSIS — Z01818 Encounter for other preprocedural examination: Secondary | ICD-10-CM | POA: Diagnosis not present

## 2023-11-05 DIAGNOSIS — H53483 Generalized contraction of visual field, bilateral: Secondary | ICD-10-CM | POA: Diagnosis not present

## 2023-11-05 DIAGNOSIS — H57813 Brow ptosis, bilateral: Secondary | ICD-10-CM | POA: Diagnosis not present

## 2023-11-05 DIAGNOSIS — H02423 Myogenic ptosis of bilateral eyelids: Secondary | ICD-10-CM | POA: Diagnosis not present

## 2023-11-05 DIAGNOSIS — H02831 Dermatochalasis of right upper eyelid: Secondary | ICD-10-CM | POA: Diagnosis not present

## 2023-11-05 DIAGNOSIS — H02834 Dermatochalasis of left upper eyelid: Secondary | ICD-10-CM | POA: Diagnosis not present

## 2023-11-05 DIAGNOSIS — H0279 Other degenerative disorders of eyelid and periocular area: Secondary | ICD-10-CM | POA: Diagnosis not present

## 2023-11-05 DIAGNOSIS — H02413 Mechanical ptosis of bilateral eyelids: Secondary | ICD-10-CM | POA: Diagnosis not present

## 2023-11-07 ENCOUNTER — Inpatient Hospital Stay: Payer: Medicare HMO

## 2023-11-07 ENCOUNTER — Inpatient Hospital Stay (HOSPITAL_BASED_OUTPATIENT_CLINIC_OR_DEPARTMENT_OTHER): Payer: Medicare HMO | Admitting: Hematology and Oncology

## 2023-11-07 ENCOUNTER — Inpatient Hospital Stay: Payer: Medicare HMO | Attending: Hematology and Oncology

## 2023-11-07 VITALS — BP 130/63 | HR 73 | Temp 97.6°F | Resp 18 | Ht 63.0 in | Wt 192.5 lb

## 2023-11-07 DIAGNOSIS — Z5112 Encounter for antineoplastic immunotherapy: Secondary | ICD-10-CM | POA: Diagnosis present

## 2023-11-07 DIAGNOSIS — C50412 Malignant neoplasm of upper-outer quadrant of left female breast: Secondary | ICD-10-CM | POA: Diagnosis not present

## 2023-11-07 DIAGNOSIS — C50512 Malignant neoplasm of lower-outer quadrant of left female breast: Secondary | ICD-10-CM | POA: Insufficient documentation

## 2023-11-07 DIAGNOSIS — Z923 Personal history of irradiation: Secondary | ICD-10-CM | POA: Diagnosis not present

## 2023-11-07 DIAGNOSIS — Z86718 Personal history of other venous thrombosis and embolism: Secondary | ICD-10-CM | POA: Insufficient documentation

## 2023-11-07 DIAGNOSIS — Z170 Estrogen receptor positive status [ER+]: Secondary | ICD-10-CM | POA: Insufficient documentation

## 2023-11-07 DIAGNOSIS — Z9221 Personal history of antineoplastic chemotherapy: Secondary | ICD-10-CM | POA: Diagnosis not present

## 2023-11-07 DIAGNOSIS — Z95828 Presence of other vascular implants and grafts: Secondary | ICD-10-CM

## 2023-11-07 DIAGNOSIS — Z79899 Other long term (current) drug therapy: Secondary | ICD-10-CM | POA: Diagnosis not present

## 2023-11-07 DIAGNOSIS — Z79811 Long term (current) use of aromatase inhibitors: Secondary | ICD-10-CM | POA: Insufficient documentation

## 2023-11-07 DIAGNOSIS — Z7901 Long term (current) use of anticoagulants: Secondary | ICD-10-CM | POA: Insufficient documentation

## 2023-11-07 DIAGNOSIS — C20 Malignant neoplasm of rectum: Secondary | ICD-10-CM

## 2023-11-07 LAB — CBC WITH DIFFERENTIAL (CANCER CENTER ONLY)
Abs Immature Granulocytes: 0.02 10*3/uL (ref 0.00–0.07)
Basophils Absolute: 0 10*3/uL (ref 0.0–0.1)
Basophils Relative: 1 %
Eosinophils Absolute: 0.1 10*3/uL (ref 0.0–0.5)
Eosinophils Relative: 3 %
HCT: 32.6 % — ABNORMAL LOW (ref 36.0–46.0)
Hemoglobin: 10.6 g/dL — ABNORMAL LOW (ref 12.0–15.0)
Immature Granulocytes: 1 %
Lymphocytes Relative: 17 %
Lymphs Abs: 0.6 10*3/uL — ABNORMAL LOW (ref 0.7–4.0)
MCH: 28.3 pg (ref 26.0–34.0)
MCHC: 32.5 g/dL (ref 30.0–36.0)
MCV: 87.2 fL (ref 80.0–100.0)
Monocytes Absolute: 0.5 10*3/uL (ref 0.1–1.0)
Monocytes Relative: 15 %
Neutro Abs: 2.3 10*3/uL (ref 1.7–7.7)
Neutrophils Relative %: 63 %
Platelet Count: 214 10*3/uL (ref 150–400)
RBC: 3.74 MIL/uL — ABNORMAL LOW (ref 3.87–5.11)
RDW: 16.3 % — ABNORMAL HIGH (ref 11.5–15.5)
WBC Count: 3.6 10*3/uL — ABNORMAL LOW (ref 4.0–10.5)
nRBC: 0 % (ref 0.0–0.2)

## 2023-11-07 LAB — CMP (CANCER CENTER ONLY)
ALT: 18 U/L (ref 0–44)
AST: 12 U/L — ABNORMAL LOW (ref 15–41)
Albumin: 3.9 g/dL (ref 3.5–5.0)
Alkaline Phosphatase: 80 U/L (ref 38–126)
Anion gap: 5 (ref 5–15)
BUN: 18 mg/dL (ref 8–23)
CO2: 28 mmol/L (ref 22–32)
Calcium: 9.6 mg/dL (ref 8.9–10.3)
Chloride: 107 mmol/L (ref 98–111)
Creatinine: 1.19 mg/dL — ABNORMAL HIGH (ref 0.44–1.00)
GFR, Estimated: 49 mL/min — ABNORMAL LOW (ref >60)
Glucose, Bld: 111 mg/dL — ABNORMAL HIGH (ref 70–99)
Potassium: 4.1 mmol/L (ref 3.5–5.1)
Sodium: 140 mmol/L (ref 135–145)
Total Bilirubin: 0.3 mg/dL (ref <1.2)
Total Protein: 6.8 g/dL (ref 6.5–8.1)

## 2023-11-07 MED ORDER — SODIUM CHLORIDE 0.9 % IV SOLN: Freq: Once | INTRAVENOUS | Status: AC

## 2023-11-07 MED ORDER — DIPHENHYDRAMINE HCL 25 MG PO CAPS
50.0000 mg | ORAL_CAPSULE | Freq: Once | ORAL | Status: AC
  Administered 2023-11-07: 50 mg via ORAL
  Filled 2023-11-07: qty 2

## 2023-11-07 MED ORDER — SODIUM CHLORIDE 0.9% FLUSH
10.0000 mL | INTRAVENOUS | Status: DC | PRN
  Administered 2023-11-07: 10 mL via INTRAVENOUS

## 2023-11-07 MED ORDER — ACETAMINOPHEN 325 MG PO TABS
650.0000 mg | ORAL_TABLET | Freq: Once | ORAL | Status: AC
  Administered 2023-11-07: 650 mg via ORAL
  Filled 2023-11-07: qty 2

## 2023-11-07 MED ORDER — SODIUM CHLORIDE 0.9% FLUSH
10.0000 mL | INTRAVENOUS | Status: DC | PRN
  Administered 2023-11-07: 10 mL

## 2023-11-07 MED ORDER — TRASTUZUMAB-ANNS CHEMO 150 MG IV SOLR
6.0000 mg/kg | Freq: Once | INTRAVENOUS | Status: AC
  Administered 2023-11-07: 525 mg via INTRAVENOUS
  Filled 2023-11-07: qty 25

## 2023-11-07 MED ORDER — HEPARIN SOD (PORK) LOCK FLUSH 100 UNIT/ML IV SOLN
500.0000 [IU] | Freq: Once | INTRAVENOUS | Status: AC | PRN
  Administered 2023-11-07: 500 [IU] via INTRAVENOUS

## 2023-11-07 MED ORDER — HEPARIN SOD (PORK) LOCK FLUSH 100 UNIT/ML IV SOLN
500.0000 [IU] | Freq: Once | INTRAVENOUS | Status: AC | PRN
  Administered 2023-11-07: 500 [IU]

## 2023-11-07 NOTE — Assessment & Plan Note (Signed)
10/17/2022 Diffuse bilateral breast pains.  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  Mild hot flash and slight joint stiffness Echocardiogram 09/30/2023: EF 55 to 60%   Breast cancer surveillance: Will order mammograms Return to infusion every 3 weeks for Herceptin maintenance every 6 weeks follow-up with me.  Last Herceptin 12/19/2023

## 2023-11-07 NOTE — Patient Instructions (Signed)
[facility] - A DEPT OF [facility]  Discharge Instructions: Thank you for choosing [facility] to provide your oncology and hematology care.   If you have a lab appointment with the Cancer Center, please go directly to the Cancer Center and check in at the registration area.   Wear comfortable clothing and clothing appropriate for easy access to any Portacath or PICC line.   We strive to give you quality time with your provider. You may need to reschedule your appointment if you arrive late (15 or more minutes).  Arriving late affects you and other patients whose appointments are after yours.  Also, if you miss three or more appointments without notifying the office, you may be dismissed from the clinic at the provider's discretion.      For prescription refill requests, have your pharmacy contact our office and allow 72 hours for refills to be completed.    Today you received the following chemotherapy and/or immunotherapy agents Kanjinti      To help prevent nausea and vomiting after your treatment, we encourage you to take your nausea medication as directed.  BELOW ARE SYMPTOMS THAT SHOULD BE REPORTED IMMEDIATELY: *FEVER GREATER THAN 100.4 F (38 C) OR HIGHER *CHILLS OR SWEATING *NAUSEA AND VOMITING THAT IS NOT CONTROLLED WITH YOUR NAUSEA MEDICATION *UNUSUAL SHORTNESS OF BREATH *UNUSUAL BRUISING OR BLEEDING *URINARY PROBLEMS (pain or burning when urinating, or frequent urination) *BOWEL PROBLEMS (unusual diarrhea, constipation, pain near the anus) TENDERNESS IN MOUTH AND THROAT WITH OR WITHOUT PRESENCE OF ULCERS (sore throat, sores in mouth, or a toothache) UNUSUAL RASH, SWELLING OR PAIN  UNUSUAL VAGINAL DISCHARGE OR ITCHING   Items with * indicate a potential emergency and should be followed up as soon as possible or go to the Emergency Department if any problems should occur.  Please show the CHEMOTHERAPY ALERT CARD or IMMUNOTHERAPY  ALERT CARD at check-in to the Emergency Department and triage nurse.  Should you have questions after your visit or need to cancel or reschedule your appointment, please contact [facility] - A DEPT OF [facility]  Dept: [phone]  and follow the prompts.  Office hours are 8:00 a.m. to 4:30 p.m. Monday - Friday. Please note that voicemails left after 4:00 p.m. may not be returned until the following business day.  We are closed weekends and major holidays. You have access to a nurse at all times for urgent questions. Please call the main number to the clinic Dept: [phone] and follow the prompts.   For any non-urgent questions, you may also contact your provider using MyChart. We now offer e-Visits for anyone 69 and older to request care online for non-urgent symptoms. For details visit [email or web address].   Also download the MyChart app! Go to the app store, search "MyChart", open the app, select [place], and log in with your MyChart username and password.

## 2023-11-07 NOTE — Progress Notes (Signed)
Patient Care Team: Renaye Rakers, MD as PCP - General (Family Medicine) Artis Delay, MD as Consulting Physician (Hematology and Oncology) Donnelly Angelica, RN as Oncology Nurse Navigator Pershing Proud, RN as Oncology Nurse Navigator [name], MD as Consulting Physician (Hematology and Oncology) Abigail Miyamoto, MD as Consulting Physician (General Surgery) Antony Blackbird, MD as Consulting Physician (Radiation Oncology)  DIAGNOSIS:  Encounter Diagnosis  Name Primary?   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 after neoadjuvant chemotherapy and radiation therapy   Primary site: Colon and Rectum   Staging method: AJCC 7th Edition   Clinical: Stage I (T2, N0, M0) signed by Artis Delay, MD on 12/28/2013  1:49 PM   Pathologic: Stage I (T2, N0, cM0) signed by Artis Delay, MD on 12/28/2013  1:49 PM   Summary: Stage I (T2, N0, cM0)     History of rectal cancer  11/17/2009 Procedure   Colonoscopy and rectal biopsy confirmed moderately differentiated adenocarcinoma   11/18/2009 Imaging   Transrectal ultrasound place staging T3 lesion   11/18/2009 Imaging   Staging CT scan of the chest, abdomen and pelvis showed multiple lesions in the liver as well as paraspinal mass of unknown etiology   12/05/2009 - 04/03/2010 Chemotherapy   The patient completed new adjuvant chemotherapy with 5-FU and radiation therapy   12/14/2009 Imaging   PET CT scan show faint metabolic activity in the paraspinal mass with no activity in the liver   04/24/2010 Surgery   She had surgical resection with negative margins. Final pathology was T2, N0, M0 (down staged by chemoradiation therapy from T3, N0, M0)   11/19/2012 Imaging   MRI of the liver confirmed benign hemangioma   01/08/2014 Procedure   Colonoscopy and biopsy  was negative   12/21/2014 Imaging   CT scan showed no recurrence of colon cancer. Incidentally, paraspinal mass is slightly larger.   10/04/2016 Procedure   She had repeat colonoscopy which showed two 3 mm polyps in the transverse colon, removed with a cold snare. Resected and retrieved. The examination was otherwise normal on direct and retroflexion views.   10/04/2016 Pathology Results   Surgical [P], transverse, polyp(s) - SERRATED POLYP WITH FOCAL FEATURES SUGGESTIVE OF SESSILE SERRATED POLYP/ADENOMA, ONE FRAGMENT. - HYPERPLASTIC POLYP WITHOUT DYSPLASIA, ONE FRAGMENT. - BENIGN COLORECTAL MUCOSA WITHOUT DYSPLASIA, ONE FRAGMENT. - SEE COMMENT. Microscopic Comment After routine specimen processing, three fragments are identified. One of the fragments demonstrates a serrated horizontally situated crypt, which is suggestive of superficial sampling of a sessile serrated polyp/adenoma (the differential includes another fragment of hyperplastic polyp). Although this is the case, the finding is very focal and not definitive.    11/09/2022 Genetic Testing   Negative genetics for [facility] Multi-Cancer +RNA Panel.  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 Report date is 11/09/2022.   The Multi-Cancer + RNA Panel offered by [facility] includes sequencing and/or deletion/duplication analysis of the following 70 genes:  AIP*, ALK, APC*, ATM*, AXIN2*, BAP1*, BARD1*, BLM*, BMPR1A*, BRCA1*, BRCA2*, BRIP1*, CDC73*, CDH1*, CDK4, CDKN1B*, CDKN2A, CHEK2*, CTNNA1*, DICER1*, EPCAM (del/dup only), EGFR, FH*, FLCN*, GREM1 (promoter dup only), HOXB13, KIT, LZTR1, MAX*, MBD4, MEN1*, MET, MITF, MLH1*, MSH2*, MSH3*, MSH6*, MUTYH*, NF1*, NF2*, NTHL1*, PALB2*, PDGFRA, PMS2*, POLD1*, POLE*, POT1*, PRKAR1A*, PTCH1*, PTEN*, RAD51C*, RAD51D*, RB1*, RET, SDHA* (sequencing only), SDHAF2*, SDHB*, SDHC*, SDHD*, SMAD4*, SMARCA4*,  SMARCB1*, SMARCE1*, STK11*, SUFU*, TMEM127*, TP53*, TSC1*, TSC2*, VHL*. RNA analysis  is performed for * genes.    11/09/2022 Genetic Testing   Negative genetics for [facility] Multi-Cancer +RNA Panel.  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 Report date is 11/09/2022.   The Multi-Cancer + RNA Panel offered by [facility] includes sequencing and/or deletion/duplication analysis of the following 70 genes:  AIP*, ALK, APC*, ATM*, AXIN2*, BAP1*, BARD1*, BLM*, BMPR1A*, BRCA1*, BRCA2*, BRIP1*, CDC73*, CDH1*, CDK4, CDKN1B*, CDKN2A, CHEK2*, CTNNA1*, DICER1*, EPCAM (del/dup only), EGFR, FH*, FLCN*, GREM1 (promoter dup only), HOXB13, KIT, LZTR1, MAX*, MBD4, MEN1*, MET, MITF, MLH1*, MSH2*, MSH3*, MSH6*, MUTYH*, NF1*, NF2*, NTHL1*, PALB2*, PDGFRA, PMS2*, POLD1*, POLE*, POT1*, PRKAR1A*, PTCH1*, PTEN*, RAD51C*, RAD51D*, RB1*, RET, SDHA* (sequencing only), SDHAF2*, SDHB*, SDHC*, SDHD*, SMAD4*, SMARCA4*, SMARCB1*, SMARCE1*, STK11*, SUFU*, TMEM127*, TP53*, TSC1*, TSC2*, VHL*. RNA analysis is performed for * genes.    Malignant neoplasm of upper-outer quadrant of left breast in female, estrogen receptor positive (HCC)  10/17/2022 Initial Diagnosis   Diffuse bilateral breast pains.  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 Report date is 11/09/2022.   The Multi-Cancer + RNA Panel offered by [facility]  includes sequencing and/or deletion/duplication analysis of the following 70 genes:  AIP*, ALK, APC*, ATM*, AXIN2*, BAP1*, BARD1*, BLM*, BMPR1A*, BRCA1*, BRCA2*, BRIP1*, CDC73*, CDH1*, CDK4, CDKN1B*, CDKN2A, CHEK2*, CTNNA1*, DICER1*, EPCAM (del/dup only), EGFR, FH*, FLCN*, GREM1 (promoter dup only), HOXB13, KIT, LZTR1, MAX*, MBD4, MEN1*, MET, MITF, MLH1*, MSH2*, MSH3*, MSH6*, MUTYH*, NF1*, NF2*, NTHL1*, PALB2*, PDGFRA, PMS2*, POLD1*, POLE*, POT1*, PRKAR1A*, PTCH1*, PTEN*, RAD51C*, RAD51D*, RB1*, RET, SDHA* (sequencing only), SDHAF2*, SDHB*, SDHC*, SDHD*, SMAD4*, SMARCA4*, SMARCB1*, SMARCE1*, STK11*, SUFU*, TMEM127*, TP53*, TSC1*, TSC2*, VHL*. RNA analysis is performed for * genes.    11/14/2022 Surgery   Left lumpectomy: Grade 2 ILC with LCIS 3.5 cm, 1/8 lymph node with micrometastases ER 100%, PR 40%, Ki-67 20%, HER2 3+ positive; lymph node prognostic panel: ER 100% PR 20% Ki-67 10%, HER2 2+ by IHC, FISH negative ratio 1.14   06/04/2023 - 07/23/2023 Radiation Therapy   Plan Name: Breast_L_BH Site: Breast, Left Technique: 3D Mode: Photon Dose Per Fraction: 1.8 Gy Prescribed Dose (Delivered / Prescribed): 50.4 Gy / 50.4 Gy Prescribed Fxs (Delivered / Prescribed): 28 / 28   Plan Name: Brst_L_Scv_BH Site: Breast, Left Technique: 3D Mode: Photon Dose Per Fraction: 1.8 Gy Prescribed Dose (Delivered / Prescribed): 50.4 Gy / 50.4 Gy Prescribed Fxs (Delivered / Prescribed): 28 / 28   Plan Name: Brst_L_Bst_BH Site: Breast, Left Technique: 3D Mode: Photon Dose Per Fraction: 2 Gy Prescribed Dose (Delivered / Prescribed): 12 Gy / 12 Gy Prescribed Fxs (Delivered / Prescribed): 6 / 6   Malignant neoplasm of lower-outer quadrant of left breast of female, estrogen receptor positive (HCC)  10/30/2022 Initial Diagnosis   Malignant neoplasm of lower-outer quadrant of  left breast of female, estrogen receptor positive (HCC)   12/27/2022 -  Chemotherapy   Patient is on Treatment Plan : BREAST Docetaxel +  Carboplatin + Trastuzumab (TCH) q21d / Trastuzumab q21d       CHIEF COMPLIANT: Herceptin maintenance therapy  HISTORY OF PRESENT ILLNESS:   History of Present Illness   The patient, with a history of cancer, is currently on letrozole and Herceptin, with no new or different issues reported. She has been regularly monitored with echocardiograms, the most recent of which was normal. Her basic labs from today show a slight decrease in counts, but nothing worrisome. The white blood cell count has been improving overall, despite a slight dip from the last reading. Hemoglobin is at 10.6, down from 11.6, but has been consistently between 10.5 and 11 most of the time. Platelets are fine. The patient is also on Xarelto for a blood clot in the right jugular and subclavian veins, which was started in February of this year. She also has a port in place for treatment.         ALLERGIES:  is allergic to norgesic [orphenadrine-aspirin-caffeine], hydrocodone, oxycodone, and antibacterial hand soap [triclosan].  MEDICATIONS:  Current Outpatient Medications  Medication Sig Dispense Refill   amLODipine (NORVASC) 10 MG tablet Take 10 mg by mouth at bedtime.      anastrozole (ARIMIDEX) 1 MG tablet Take 1 tablet (1 mg total) by mouth daily. 90 tablet 3   Aromatic Inhalants (VICKS VAPOINHALER IN) Inhale 1 puff into the lungs daily as needed (congestion).     atorvastatin (LIPITOR) 10 MG tablet Take 10 mg by mouth daily.     doxazosin (CARDURA) 2 MG tablet      empagliflozin (JARDIANCE) 25 MG TABS tablet Take 25 mg by mouth daily with supper.      ibuprofen (ADVIL) 800 MG tablet Take 800 mg by mouth every 8 (eight) hours as needed for moderate pain.     latanoprost (XALATAN) 0.005 % ophthalmic solution Place 1 drop into both eyes at bedtime.      lidocaine-prilocaine (EMLA) cream Apply to affected area once 30 g 3   loperamide (IMODIUM A-D) 2 MG tablet Take 2 mg by mouth 4 (four) times daily as needed for  diarrhea or loose stools.     olmesartan (BENICAR) 40 MG tablet Take 40 mg by mouth daily.     omeprazole (PRILOSEC) 40 MG capsule Take 1 capsule (40 mg total) by mouth daily. (Patient taking differently: Take 40 mg by mouth daily. PRN) 90 capsule 1   oxymetazoline (AFRIN) 0.05 % nasal spray Place 1 spray into both nostrils 2 (two) times daily.     prochlorperazine (COMPAZINE) 10 MG tablet Take 1 tablet (10 mg total) by mouth every 6 (six) hours as needed for nausea or vomiting. 30 tablet 1   rivaroxaban (XARELTO) 20 MG TABS tablet Take 1 tablet (20 mg total) by mouth daily with supper. 90 tablet 3   Semaglutide, 1 MG/DOSE, 2 MG/1.5ML SOPN Inject 1 mg into the skin every Sunday.     spironolactone (ALDACTONE) 25 MG tablet Take 50 mg by mouth daily.     vortioxetine HBr (TRINTELLIX) 10 MG TABS tablet Take 10 mg by mouth daily with supper.     No current facility-administered medications for this visit.    PHYSICAL EXAMINATION: ECOG PERFORMANCE STATUS: 1 - Symptomatic but completely ambulatory  Vitals:   11/07/23 1058  BP: 130/63  Pulse: 73  Resp: 18  Temp: 97.6  F (36.4 C)  SpO2: 97%   Filed Weights   11/07/23 1058  Weight: 192 lb 8 oz (87.3 kg)     LABORATORY DATA:  I have reviewed the data as listed    Latest Ref Rng & Units 11/07/2023   10:44 AM 10/17/2023    8:19 AM 09/05/2023    8:16 AM  CMP  Glucose 70 - 99 mg/dL 829  562  130   BUN 8 - 23 mg/dL 18  23  12    Creatinine 0.44 - 1.00 mg/dL 8.65  7.84  6.96   Sodium 135 - 145 mmol/L 140  142  141   Potassium 3.5 - 5.1 mmol/L 4.1  4.4  3.7   Chloride 98 - 111 mmol/L 107  107  108   CO2 22 - 32 mmol/L 28  27  26    Calcium 8.9 - 10.3 mg/dL 9.6  9.6  9.0   Total Protein 6.5 - 8.1 g/dL 6.8  7.5  7.1   Total Bilirubin <1.2 mg/dL 0.3  0.3  0.5   Alkaline Phos 38 - 126 U/L 80  89  92   AST 15 - 41 U/L 12  14  13    ALT 0 - 44 U/L 18  17  12      Lab Results  Component Value Date   WBC 3.6 (L) 11/07/2023   HGB 10.6 (L)  11/07/2023   HCT 32.6 (L) 11/07/2023   MCV 87.2 11/07/2023   PLT 214 11/07/2023   NEUTROABS 2.3 11/07/2023    ASSESSMENT & PLAN:  Malignant neoplasm of upper-outer quadrant of left breast in female, estrogen receptor positive (HCC) 10/17/2022 Diffuse bilateral breast pains.  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  Mild hot flash and slight joint stiffness Echocardiogram 09/30/2023: EF 55 to 60%   Breast cancer surveillance: Will order mammograms      Breast Cancer on Letrozole and Herceptin No new issues reported. Echocardiogram on October 28th was normal. Last Herceptin treatment scheduled for January 16th. -Continue current treatment plan. -Plan for port removal on January 17th.  Venous Thromboembolism On Xarelto since February for right jugular and subclavian vein clots. -Continue Xarelto until  February, one month after port removal.  Hematologic Slight decrease in white blood cell count and hemoglobin, but overall improving trend. -Monitor lab results.  Follow-up Next appointment on January 16th for last Herceptin treatment.          Orders Placed This Encounter  Procedures   IR Removal Tun Access W/ Port W/O FL    Standing Status:  Future    Standing Expiration Date:   11/06/2024    Order Specific Question:   Reason for exam:    Answer:   Remove port after chemo is done    Order Specific Question:   Preferred Imaging Location?    Answer:   [facility]   The patient has a good understanding of the overall plan. she agrees with it. she will call with any problems that may develop before the next visit here. Total time spent: 30 mins including face to face time and time spent for planning, charting and co-ordination of care   Tamsen Meek, MD 11/07/23

## 2023-11-12 ENCOUNTER — Other Ambulatory Visit: Payer: Self-pay

## 2023-11-15 DIAGNOSIS — C50512 Malignant neoplasm of lower-outer quadrant of left female breast: Secondary | ICD-10-CM | POA: Diagnosis not present

## 2023-11-15 DIAGNOSIS — F331 Major depressive disorder, recurrent, moderate: Secondary | ICD-10-CM | POA: Diagnosis not present

## 2023-11-15 DIAGNOSIS — F902 Attention-deficit hyperactivity disorder, combined type: Secondary | ICD-10-CM | POA: Diagnosis not present

## 2023-11-15 DIAGNOSIS — I10 Essential (primary) hypertension: Secondary | ICD-10-CM | POA: Diagnosis not present

## 2023-11-15 DIAGNOSIS — K6281 Anal sphincter tear (healed) (nontraumatic) (old): Secondary | ICD-10-CM | POA: Diagnosis not present

## 2023-11-15 DIAGNOSIS — Z853 Personal history of malignant neoplasm of breast: Secondary | ICD-10-CM | POA: Diagnosis not present

## 2023-11-19 ENCOUNTER — Encounter: Payer: Self-pay | Admitting: Hematology and Oncology

## 2023-11-22 ENCOUNTER — Encounter: Payer: Self-pay | Admitting: *Deleted

## 2023-11-25 ENCOUNTER — Ambulatory Visit: Payer: Medicare HMO | Attending: Surgery

## 2023-11-25 VITALS — Wt 194.5 lb

## 2023-11-25 DIAGNOSIS — Z483 Aftercare following surgery for neoplasm: Secondary | ICD-10-CM | POA: Insufficient documentation

## 2023-11-25 NOTE — Therapy (Signed)
OUTPATIENT PHYSICAL THERAPY SOZO SCREENING NOTE   Patient Name: Tonya Vincent MRN: [id number] DOB:October 02, 1951, 72 y.o., female Today's Date: 11/25/2023  PCP: Renaye Rakers, MD REFERRING PROVIDER: Abigail Miyamoto, MD   PT End of Session - 11/25/23 0827     Visit Number 2   # unchanged due to screen only   PT Start Time 0825    PT Stop Time 0830    PT Time Calculation (min) 5 min    Activity Tolerance Patient tolerated treatment well    Behavior During Therapy [facility] for tasks assessed/performed             Past Medical History:  Diagnosis Date   ADHD (attention deficit hyperactivity disorder)    Anemia    Anxiety    Arthritis    Breast cancer (HCC) 10/2022   left breast ILC   Complication of anesthesia    difficulty waking up   Depression    Diabetes mellitus    type 2 DM   Dyspnea    with exertion   Heart murmur    History of radiation therapy    Left breast- 06/04/23-07/23/23-Dr. Antony Blackbird   History of rectal cancer 10/04/2011   Hypertension    Overactive bladder    Paraspinal mass 12/29/2014   Rectal cancer (HCC) dx'd 11/16/09   xrt comp 01/2010; chemo comp 01/2011   Tubular adenoma of colon 01/08/2014   Past Surgical History:  Procedure Laterality Date   ABDOMINAL HYSTERECTOMY     for uterine fibroids   BREAST LUMPECTOMY WITH RADIOACTIVE SEED LOCALIZATION Left 11/14/2022   Procedure: LEFT BREAST RADIOACTIVE SEED GUIDED LUMPECTOMY x2;  Surgeon: Abigail Miyamoto, MD;  Location: [facility];  Service: General;  Laterality: Left;   COLON SURGERY N/A 04/24/2010   "took out half rectum"; rectal cancer   EYE SURGERY     lasik surgery   HAND SURGERY     carpal tunnel- left   HEMORRHOID SURGERY N/A 1980   IR CV LINE INJECTION  03/22/2023   IR IMAGING GUIDED PORT INSERTION  04/09/2023   IR REMOVAL TUN ACCESS W/ PORT W/O FL MOD SED  04/09/2023   ORIF ANKLE FRACTURE Right 04/11/2020   Procedure: OPEN REDUCTION INTERNAL FIXATION (ORIF) ANKLE  FRACTURE;  Surgeon: Vickki Hearing, MD;  Location: [facility];  Service: Orthopedics;  Laterality: Right;   PORT-A-CATH REMOVAL N/A 02/17/2019   Procedure: REMOVAL PORT-A-CATH;  Surgeon: Abigail Miyamoto, MD;  Location: [facility] OR;  Service: General;  Laterality: N/A;   PORTACATH PLACEMENT     2011   PORTACATH PLACEMENT N/A 12/17/2022   Procedure: INSERTION PORT-A-CATH;  Surgeon: Abigail Miyamoto, MD;  Location: [facility] OR;  Service: General;  Laterality: N/A;   RECTAL SURGERY     2010   SENTINEL NODE BIOPSY Left 11/14/2022   Procedure: SENTINEL LYMPH NODE BIOPSY;  Surgeon: Abigail Miyamoto, MD;  Location: [facility];  Service: General;  Laterality: Left;   WISDOM TOOTH EXTRACTION     Patient Active Problem List   Diagnosis Date Noted   Deep venous thrombosis (HCC) 02/28/2023   Malignant neoplasm of rectum (HCC) 01/18/2023   Port-A-Cath in place 12/27/2022   Genetic testing 11/12/2022   Malignant neoplasm of lower-outer quadrant of left breast of female, estrogen receptor positive (HCC) 10/30/2022   Malignant neoplasm of upper-outer quadrant of left breast in female, estrogen receptor positive (HCC) 10/23/2022   S/P ORIF (open reduction internal fixation) fracture right ankle 04/11/20 04/21/2020   Closed trimalleolar  fracture of right ankle    History of colonic polyps 12/18/2019   Fecal smearing 12/18/2019   Left arm pain 02/04/2019   Rectal bleeding 02/06/2018   Microcytosis 01/01/2017   Essential hypertension 01/02/2016   Change in stool caliber 12/31/2014   Paraspinal mass 12/29/2014   Anemia in chronic illness 12/29/2014   History of rectal cancer 10/04/2011    REFERRING DIAG: left breast cancer at risk for lymphedema  THERAPY DIAG:  Aftercare following surgery for neoplasm  PERTINENT HISTORY: Patient was diagnosed on 10/12/2022 with 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 It measures 4 mm and 8 mm and is located in the upper outer quadrant. The  smaller mass is triple positive with a Ki67 of 40%. The larger mass is ER/PR positive and HER2 negative with a Ki67 of 15% . Pt had left lumpectomy with SLNB on 11/14/2022. 1/8 LN's positive. She will be having chemotherapy with Herceptin maintenance, radiation and anti estrogens. There is a deep seroma at the lumpectomy site identified by surgeon with no further treatment required at present per MD. Pt is a part time pharmacist   PRECAUTIONS: left UE Lymphedema risk, None  SUBJECTIVE: Pt returns for her first 3 month L-Dex screen.   PAIN:  Are you having pain? No  SOZO SCREENING: Patient was assessed today using the SOZO machine to determine the lymphedema index score. This was compared to her baseline score. It was determined that she is within the recommended range when compared to her baseline and no further action is needed at this time. She will continue SOZO screenings. These are done every 3 months for 2 years post operatively followed by every 6 months for 2 years, and then annually.   L-DEX FLOWSHEETS - 11/25/23 0800       L-DEX LYMPHEDEMA SCREENING   Measurement Type Unilateral    L-DEX MEASUREMENT EXTREMITY Upper Extremity    POSITION  Standing    DOMINANT SIDE Right    At Risk Side Left    BASELINE SCORE (UNILATERAL) 2.5    L-DEX SCORE (UNILATERAL) 1.3    VALUE CHANGE (UNILAT) -1.2              Hermenia Bers, PTA 11/25/2023, 8:30 AM

## 2023-11-27 ENCOUNTER — Other Ambulatory Visit: Payer: Self-pay

## 2023-11-28 ENCOUNTER — Inpatient Hospital Stay: Payer: Medicare HMO

## 2023-11-28 VITALS — BP 129/78 | HR 80 | Temp 98.1°F | Resp 18 | Wt 195.1 lb

## 2023-11-28 DIAGNOSIS — C50512 Malignant neoplasm of lower-outer quadrant of left female breast: Secondary | ICD-10-CM

## 2023-11-28 DIAGNOSIS — Z5112 Encounter for antineoplastic immunotherapy: Secondary | ICD-10-CM | POA: Diagnosis not present

## 2023-11-28 MED ORDER — SODIUM CHLORIDE 0.9% FLUSH
10.0000 mL | INTRAVENOUS | Status: DC | PRN
  Administered 2023-11-28: 10 mL

## 2023-11-28 MED ORDER — DIPHENHYDRAMINE HCL 25 MG PO CAPS
50.0000 mg | ORAL_CAPSULE | Freq: Once | ORAL | Status: AC
  Administered 2023-11-28: 50 mg via ORAL
  Filled 2023-11-28: qty 2

## 2023-11-28 MED ORDER — SODIUM CHLORIDE 0.9 % IV SOLN: Freq: Once | INTRAVENOUS | Status: AC

## 2023-11-28 MED ORDER — ACETAMINOPHEN 325 MG PO TABS
650.0000 mg | ORAL_TABLET | Freq: Once | ORAL | Status: AC
Start: 2023-11-28 — End: 2023-11-28
  Administered 2023-11-28: 650 mg via ORAL
  Filled 2023-11-28: qty 2

## 2023-11-28 MED ORDER — TRASTUZUMAB-ANNS CHEMO 150 MG IV SOLR
6.0000 mg/kg | Freq: Once | INTRAVENOUS | Status: AC
  Administered 2023-11-28: 525 mg via INTRAVENOUS
  Filled 2023-11-28: qty 25

## 2023-11-28 MED ORDER — HEPARIN SOD (PORK) LOCK FLUSH 100 UNIT/ML IV SOLN
500.0000 [IU] | Freq: Once | INTRAVENOUS | Status: AC | PRN
  Administered 2023-11-28: 500 [IU]

## 2023-11-28 NOTE — Research (Signed)
[id number], ICE COMPRESS: RANDOMIZED TRIAL OF LIMB CRYOCOMPRESSION VERSUS CONTINUOUS COMPRESSION VERSUS LOW CYCLIC COMPRESSION FOR THE PREVENTION  OF TAXANE-INDUCED PERIPHERAL NEUROPATHY   Patient arrives today Unaccompanied for the 52 week visit. Confirmed patient does not have wounds, sores, or lesions to extremities.   PROs:  Per study protocol, all PROs required for this visit were completed prior to other study activities and completeness has been verified.     ADVERSE EVENTS: Patient reports AE as below; no new AEs. Attribution per Dr Pamelia Hoit.  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 Patient NOVAH GREGSTON tolerated all testing without complaint.     DISPOSITION: Upon completion off all study requirements, patient walked to exit.   The patient was thanked for their time and continued voluntary participation in this study. Patient ATASHA SPEESE has been provided direct contact information and is encouraged to contact this Nurse for any needs or questions. This is the last required study contact; encouraged her to call if she has further questions.  Margret Chance Muzamil Harker, RN, BSN, [facility]  [facility]  [facility] Clinical Research  Nurse [facility] Direct Dial [phone] 11/28/2023 12:45 PM

## 2023-11-28 NOTE — Patient Instructions (Signed)
 [facility] - A DEPT OF [facility]  Discharge Instructions: Thank you for choosing [facility] to provide your oncology and hematology care.   If you have a lab appointment with the Cancer Center, please go directly to the Cancer Center and check in at the registration area.   Wear comfortable clothing and clothing appropriate for easy access to any Portacath or PICC line.   We strive to give you quality time with your provider. You may need to reschedule your appointment if you arrive late (15 or more minutes).  Arriving late affects you and other patients whose appointments are after yours.  Also, if you miss three or more appointments without notifying the office, you may be dismissed from the clinic at the provider's discretion.      For prescription refill requests, have your pharmacy contact our office and allow 72 hours for refills to be completed.    Today you received the following chemotherapy and/or immunotherapy agents Kanjinti      To help prevent nausea and vomiting after your treatment, we encourage you to take your nausea medication as directed.  BELOW ARE SYMPTOMS THAT SHOULD BE REPORTED IMMEDIATELY: *FEVER GREATER THAN 100.4 F (38 C) OR HIGHER *CHILLS OR SWEATING *NAUSEA AND VOMITING THAT IS NOT CONTROLLED WITH YOUR NAUSEA MEDICATION *UNUSUAL SHORTNESS OF BREATH *UNUSUAL BRUISING OR BLEEDING *URINARY PROBLEMS (pain or burning when urinating, or frequent urination) *BOWEL PROBLEMS (unusual diarrhea, constipation, pain near the anus) TENDERNESS IN MOUTH AND THROAT WITH OR WITHOUT PRESENCE OF ULCERS (sore throat, sores in mouth, or a toothache) UNUSUAL RASH, SWELLING OR PAIN  UNUSUAL VAGINAL DISCHARGE OR ITCHING   Items with * indicate a potential emergency and should be followed up as soon as possible or go to the Emergency Department if any problems should occur.  Please show the CHEMOTHERAPY ALERT CARD or IMMUNOTHERAPY  ALERT CARD at check-in to the Emergency Department and triage nurse.  Should you have questions after your visit or need to cancel or reschedule your appointment, please contact [facility] - A DEPT OF [facility]  Dept: [phone]  and follow the prompts.  Office hours are 8:00 a.m. to 4:30 p.m. Monday - Friday. Please note that voicemails left after 4:00 p.m. may not be returned until the following business day.  We are closed weekends and major holidays. You have access to a nurse at all times for urgent questions. Please call the main number to the clinic Dept: [phone] and follow the prompts.   For any non-urgent questions, you may also contact your provider using MyChart. We now offer e-Visits for anyone 69 and older to request care online for non-urgent symptoms. For details visit [email or web address].   Also download the MyChart app! Go to the app store, search "MyChart", open the app, select [place], and log in with your MyChart username and password.

## 2023-12-12 ENCOUNTER — Encounter: Payer: Self-pay | Admitting: Hematology and Oncology

## 2023-12-17 ENCOUNTER — Encounter: Payer: Self-pay | Admitting: Hematology and Oncology

## 2023-12-19 ENCOUNTER — Encounter: Payer: Self-pay | Admitting: *Deleted

## 2023-12-19 ENCOUNTER — Other Ambulatory Visit: Payer: Self-pay | Admitting: *Deleted

## 2023-12-19 ENCOUNTER — Inpatient Hospital Stay: Payer: HMO | Admitting: Hematology and Oncology

## 2023-12-19 ENCOUNTER — Encounter: Payer: Self-pay | Admitting: Hematology and Oncology

## 2023-12-19 ENCOUNTER — Inpatient Hospital Stay: Payer: HMO

## 2023-12-19 ENCOUNTER — Inpatient Hospital Stay: Payer: HMO | Attending: Hematology and Oncology

## 2023-12-19 VITALS — BP 131/69 | HR 78 | Temp 98.0°F | Resp 18 | Ht 63.0 in | Wt 199.1 lb

## 2023-12-19 DIAGNOSIS — Z7901 Long term (current) use of anticoagulants: Secondary | ICD-10-CM | POA: Diagnosis not present

## 2023-12-19 DIAGNOSIS — C50412 Malignant neoplasm of upper-outer quadrant of left female breast: Secondary | ICD-10-CM | POA: Insufficient documentation

## 2023-12-19 DIAGNOSIS — Z170 Estrogen receptor positive status [ER+]: Secondary | ICD-10-CM

## 2023-12-19 DIAGNOSIS — E559 Vitamin D deficiency, unspecified: Secondary | ICD-10-CM | POA: Insufficient documentation

## 2023-12-19 DIAGNOSIS — Z923 Personal history of irradiation: Secondary | ICD-10-CM | POA: Insufficient documentation

## 2023-12-19 DIAGNOSIS — Z5112 Encounter for antineoplastic immunotherapy: Secondary | ICD-10-CM | POA: Insufficient documentation

## 2023-12-19 DIAGNOSIS — Z1721 Progesterone receptor positive status: Secondary | ICD-10-CM | POA: Diagnosis not present

## 2023-12-19 DIAGNOSIS — Z79811 Long term (current) use of aromatase inhibitors: Secondary | ICD-10-CM | POA: Insufficient documentation

## 2023-12-19 DIAGNOSIS — Z1731 Human epidermal growth factor receptor 2 positive status: Secondary | ICD-10-CM | POA: Diagnosis not present

## 2023-12-19 DIAGNOSIS — Z79899 Other long term (current) drug therapy: Secondary | ICD-10-CM | POA: Insufficient documentation

## 2023-12-19 LAB — CMP (CANCER CENTER ONLY)
ALT: 19 U/L (ref 0–44)
AST: 14 U/L — ABNORMAL LOW (ref 15–41)
Albumin: 4.1 g/dL (ref 3.5–5.0)
Alkaline Phosphatase: 85 U/L (ref 38–126)
Anion gap: 6 (ref 5–15)
BUN: 14 mg/dL (ref 8–23)
CO2: 28 mmol/L (ref 22–32)
Calcium: 9.6 mg/dL (ref 8.9–10.3)
Chloride: 104 mmol/L (ref 98–111)
Creatinine: 1.2 mg/dL — ABNORMAL HIGH (ref 0.44–1.00)
GFR, Estimated: 48 mL/min — ABNORMAL LOW (ref >60)
Glucose, Bld: 105 mg/dL — ABNORMAL HIGH (ref 70–99)
Potassium: 4.4 mmol/L (ref 3.5–5.1)
Sodium: 138 mmol/L (ref 135–145)
Total Bilirubin: 0.4 mg/dL (ref 0.0–1.2)
Total Protein: 7.3 g/dL (ref 6.5–8.1)

## 2023-12-19 LAB — CBC WITH DIFFERENTIAL (CANCER CENTER ONLY)
Abs Immature Granulocytes: 0.02 10*3/uL (ref 0.00–0.07)
Basophils Absolute: 0 10*3/uL (ref 0.0–0.1)
Basophils Relative: 1 %
Eosinophils Absolute: 0.1 10*3/uL (ref 0.0–0.5)
Eosinophils Relative: 4 %
HCT: 34.5 % — ABNORMAL LOW (ref 36.0–46.0)
Hemoglobin: 11.2 g/dL — ABNORMAL LOW (ref 12.0–15.0)
Immature Granulocytes: 1 %
Lymphocytes Relative: 19 %
Lymphs Abs: 0.8 10*3/uL (ref 0.7–4.0)
MCH: 27.7 pg (ref 26.0–34.0)
MCHC: 32.5 g/dL (ref 30.0–36.0)
MCV: 85.2 fL (ref 80.0–100.0)
Monocytes Absolute: 0.4 10*3/uL (ref 0.1–1.0)
Monocytes Relative: 10 %
Neutro Abs: 2.7 10*3/uL (ref 1.7–7.7)
Neutrophils Relative %: 65 %
Platelet Count: 213 10*3/uL (ref 150–400)
RBC: 4.05 MIL/uL (ref 3.87–5.11)
RDW: 16.4 % — ABNORMAL HIGH (ref 11.5–15.5)
WBC Count: 4 10*3/uL (ref 4.0–10.5)
nRBC: 0 % (ref 0.0–0.2)

## 2023-12-19 MED ORDER — DIPHENHYDRAMINE HCL 25 MG PO CAPS
50.0000 mg | ORAL_CAPSULE | Freq: Once | ORAL | Status: AC
Start: 2023-12-19 — End: 2023-12-19
  Administered 2023-12-19: 50 mg via ORAL
  Filled 2023-12-19: qty 2

## 2023-12-19 MED ORDER — HEPARIN SOD (PORK) LOCK FLUSH 100 UNIT/ML IV SOLN
500.0000 [IU] | Freq: Once | INTRAVENOUS | Status: AC | PRN
  Administered 2023-12-19: 500 [IU]

## 2023-12-19 MED ORDER — SODIUM CHLORIDE 0.9 % IV SOLN: Freq: Once | INTRAVENOUS | Status: AC

## 2023-12-19 MED ORDER — ERGOCALCIFEROL 1.25 MG (50000 UT) PO CAPS: 50000.0000 [IU] | ORAL_CAPSULE | ORAL | Status: AC

## 2023-12-19 MED ORDER — TRASTUZUMAB-ANNS CHEMO 150 MG IV SOLR
6.0000 mg/kg | Freq: Once | INTRAVENOUS | Status: AC
  Administered 2023-12-19: 525 mg via INTRAVENOUS
  Filled 2023-12-19: qty 25

## 2023-12-19 MED ORDER — ACETAMINOPHEN 325 MG PO TABS
650.0000 mg | ORAL_TABLET | Freq: Once | ORAL | Status: AC
Start: 2023-12-19 — End: 2023-12-19
  Administered 2023-12-19: 650 mg via ORAL
  Filled 2023-12-19: qty 2

## 2023-12-19 MED ORDER — SODIUM CHLORIDE 0.9% FLUSH
10.0000 mL | INTRAVENOUS | Status: DC | PRN
Start: 2023-12-19 — End: 2023-12-19
  Administered 2023-12-19: 10 mL

## 2023-12-19 NOTE — Research (Signed)
Trial:  [id number]: COGNITIVE TRAINING FOR CANCER RELATED COGNITIVE IMPAIRMENT IN BREAST CANCER SURVIVORS: A MULTI-CENTER RANDOMIZED DOUBLE- BLINDED CONTROLLED TRIAL  [name] was identified by Dr Pamelia Hoit as a potential candidate for the above listed study.  This Clinical Research Nurse met with Tonya Vincent, [id number], on 12/19/23 in a manner and location that ensures patient privacy to discuss participation in the above listed research study.  Patient is Unaccompanied.  A copy of the informed consent document and separate HIPAA Authorization was provided to the patient.  Patient reads, speaks, and understands [place].   Patient was provided with the business card of this Nurse and encouraged to contact the research team with any questions.  Approximately 15 minutes were spent with the patient reviewing the informed consent documents.  Patient was provided the option of taking informed consent documents home to review and was encouraged to review at their convenience with their support network, including other care providers. Patient took the consent documents home to review. Reviewed screening questions with patient; based on her answers, she is not eligible for study enrollment at this time (#1=9; #2=6; #3=3). Notified Dr Pamelia Hoit via inbasket message. Plan follow up with patient in March to see if she is eligible and still interested at that time. Margret Chance Phillis Thackeray, RN, BSN, [facility]  [facility]  [facility] Clinical Research Nurse [facility] Direct Dial [phone] 12/19/2023 11:50 AM

## 2023-12-19 NOTE — Assessment & Plan Note (Signed)
10/17/2022 Diffuse bilateral breast pains.  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  Mild hot flash and slight joint stiffness Echocardiogram 09/30/2023: EF 55 to 60%   Breast cancer surveillance:  11/15/2023: Mammogram: [facility]: Benign breast density category B 12/19/2023: Breast exam: Benign  Return to clinic in 1 year for follow-up

## 2023-12-19 NOTE — Progress Notes (Signed)
Patient Care Team: Renaye Rakers, MD as PCP - General (Family Medicine) Artis Delay, MD as Consulting Physician (Hematology and Oncology) Donnelly Angelica, RN as Oncology Nurse Navigator Pershing Proud, RN as Oncology Nurse Navigator [name], MD as Consulting Physician (Hematology and Oncology) Abigail Miyamoto, MD as Consulting Physician (General Surgery) Antony Blackbird, MD as Consulting Physician (Radiation Oncology)  DIAGNOSIS:  Encounter Diagnosis  Name Primary?   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 after neoadjuvant chemotherapy and radiation therapy   Primary site: Colon and Rectum   Staging method: AJCC 7th Edition   Clinical: Stage I (T2, N0, M0) signed by Artis Delay, MD on 12/28/2013  1:49 PM   Pathologic: Stage I (T2, N0, cM0) signed by Artis Delay, MD on 12/28/2013  1:49 PM   Summary: Stage I (T2, N0, cM0)     History of rectal cancer  11/17/2009 Procedure   Colonoscopy and rectal biopsy confirmed moderately differentiated adenocarcinoma   11/18/2009 Imaging   Transrectal ultrasound place staging T3 lesion   11/18/2009 Imaging   Staging CT scan of the chest, abdomen and pelvis showed multiple lesions in the liver as well as paraspinal mass of unknown etiology   12/05/2009 - 04/03/2010 Chemotherapy   The patient completed new adjuvant chemotherapy with 5-FU and radiation therapy   12/14/2009 Imaging   PET CT scan show faint metabolic activity in the paraspinal mass with no activity in the liver   04/24/2010 Surgery   She had surgical resection with negative margins. Final pathology was T2, N0, M0 (down staged by chemoradiation therapy from T3, N0, M0)   11/19/2012 Imaging   MRI of the liver confirmed benign hemangioma   01/08/2014 Procedure   Colonoscopy and biopsy  was negative   12/21/2014 Imaging   CT scan showed no recurrence of colon cancer. Incidentally, paraspinal mass is slightly larger.   10/04/2016 Procedure   She had repeat colonoscopy which showed two 3 mm polyps in the transverse colon, removed with a cold snare. Resected and retrieved. The examination was otherwise normal on direct and retroflexion views.   10/04/2016 Pathology Results   Surgical [P], transverse, polyp(s) - SERRATED POLYP WITH FOCAL FEATURES SUGGESTIVE OF SESSILE SERRATED POLYP/ADENOMA, ONE FRAGMENT. - HYPERPLASTIC POLYP WITHOUT DYSPLASIA, ONE FRAGMENT. - BENIGN COLORECTAL MUCOSA WITHOUT DYSPLASIA, ONE FRAGMENT. - SEE COMMENT. Microscopic Comment After routine specimen processing, three fragments are identified. One of the fragments demonstrates a serrated horizontally situated crypt, which is suggestive of superficial sampling of a sessile serrated polyp/adenoma (the differential includes another fragment of hyperplastic polyp). Although this is the case, the finding is very focal and not definitive.    11/09/2022 Genetic Testing   Negative genetics for [facility] Multi-Cancer +RNA Panel.  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 Report date is 11/09/2022.   The Multi-Cancer + RNA Panel offered by [facility] includes sequencing and/or deletion/duplication analysis of the following 70 genes:  AIP*, ALK, APC*, ATM*, AXIN2*, BAP1*, BARD1*, BLM*, BMPR1A*, BRCA1*, BRCA2*, BRIP1*, CDC73*, CDH1*, CDK4, CDKN1B*, CDKN2A, CHEK2*, CTNNA1*, DICER1*, EPCAM (del/dup only), EGFR, FH*, FLCN*, GREM1 (promoter dup only), HOXB13, KIT, LZTR1, MAX*, MBD4, MEN1*, MET, MITF, MLH1*, MSH2*, MSH3*, MSH6*, MUTYH*, NF1*, NF2*, NTHL1*, PALB2*, PDGFRA, PMS2*, POLD1*, POLE*, POT1*, PRKAR1A*, PTCH1*, PTEN*, RAD51C*, RAD51D*, RB1*, RET, SDHA* (sequencing only), SDHAF2*, SDHB*, SDHC*, SDHD*, SMAD4*, SMARCA4*,  SMARCB1*, SMARCE1*, STK11*, SUFU*, TMEM127*, TP53*, TSC1*, TSC2*, VHL*. RNA analysis  is performed for * genes.    11/09/2022 Genetic Testing   Negative genetics for [facility] Multi-Cancer +RNA Panel.  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 Report date is 11/09/2022.   The Multi-Cancer + RNA Panel offered by [facility] includes sequencing and/or deletion/duplication analysis of the following 70 genes:  AIP*, ALK, APC*, ATM*, AXIN2*, BAP1*, BARD1*, BLM*, BMPR1A*, BRCA1*, BRCA2*, BRIP1*, CDC73*, CDH1*, CDK4, CDKN1B*, CDKN2A, CHEK2*, CTNNA1*, DICER1*, EPCAM (del/dup only), EGFR, FH*, FLCN*, GREM1 (promoter dup only), HOXB13, KIT, LZTR1, MAX*, MBD4, MEN1*, MET, MITF, MLH1*, MSH2*, MSH3*, MSH6*, MUTYH*, NF1*, NF2*, NTHL1*, PALB2*, PDGFRA, PMS2*, POLD1*, POLE*, POT1*, PRKAR1A*, PTCH1*, PTEN*, RAD51C*, RAD51D*, RB1*, RET, SDHA* (sequencing only), SDHAF2*, SDHB*, SDHC*, SDHD*, SMAD4*, SMARCA4*, SMARCB1*, SMARCE1*, STK11*, SUFU*, TMEM127*, TP53*, TSC1*, TSC2*, VHL*. RNA analysis is performed for * genes.    Malignant neoplasm of upper-outer quadrant of left breast in female, estrogen receptor positive (HCC)  10/17/2022 Initial Diagnosis   Diffuse bilateral breast pains.  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 Report date is 11/09/2022.   The Multi-Cancer + RNA Panel offered by [facility]  includes sequencing and/or deletion/duplication analysis of the following 70 genes:  AIP*, ALK, APC*, ATM*, AXIN2*, BAP1*, BARD1*, BLM*, BMPR1A*, BRCA1*, BRCA2*, BRIP1*, CDC73*, CDH1*, CDK4, CDKN1B*, CDKN2A, CHEK2*, CTNNA1*, DICER1*, EPCAM (del/dup only), EGFR, FH*, FLCN*, GREM1 (promoter dup only), HOXB13, KIT, LZTR1, MAX*, MBD4, MEN1*, MET, MITF, MLH1*, MSH2*, MSH3*, MSH6*, MUTYH*, NF1*, NF2*, NTHL1*, PALB2*, PDGFRA, PMS2*, POLD1*, POLE*, POT1*, PRKAR1A*, PTCH1*, PTEN*, RAD51C*, RAD51D*, RB1*, RET, SDHA* (sequencing only), SDHAF2*, SDHB*, SDHC*, SDHD*, SMAD4*, SMARCA4*, SMARCB1*, SMARCE1*, STK11*, SUFU*, TMEM127*, TP53*, TSC1*, TSC2*, VHL*. RNA analysis is performed for * genes.    11/14/2022 Surgery   Left lumpectomy: Grade 2 ILC with LCIS 3.5 cm, 1/8 lymph node with micrometastases ER 100%, PR 40%, Ki-67 20%, HER2 3+ positive; lymph node prognostic panel: ER 100% PR 20% Ki-67 10%, HER2 2+ by IHC, FISH negative ratio 1.14   06/04/2023 - 07/23/2023 Radiation Therapy   Plan Name: Breast_L_BH Site: Breast, Left Technique: 3D Mode: Photon Dose Per Fraction: 1.8 Gy Prescribed Dose (Delivered / Prescribed): 50.4 Gy / 50.4 Gy Prescribed Fxs (Delivered / Prescribed): 28 / 28   Plan Name: Brst_L_Scv_BH Site: Breast, Left Technique: 3D Mode: Photon Dose Per Fraction: 1.8 Gy Prescribed Dose (Delivered / Prescribed): 50.4 Gy / 50.4 Gy Prescribed Fxs (Delivered / Prescribed): 28 / 28   Plan Name: Brst_L_Bst_BH Site: Breast, Left Technique: 3D Mode: Photon Dose Per Fraction: 2 Gy Prescribed Dose (Delivered / Prescribed): 12 Gy / 12 Gy Prescribed Fxs (Delivered / Prescribed): 6 / 6   Malignant neoplasm of lower-outer quadrant of left breast of female, estrogen receptor positive (HCC)  10/30/2022 Initial Diagnosis   Malignant neoplasm of lower-outer quadrant of  left breast of female, estrogen receptor positive (HCC)   12/27/2022 -  Chemotherapy   Patient is on Treatment Plan : BREAST Docetaxel +  Carboplatin + Trastuzumab (TCH) q21d / Trastuzumab q21d       CHIEF COMPLIANT: Final treatment of Herceptin today  HISTORY OF PRESENT ILLNESS:  History of Present Illness   The patient, with a history of breast cancer, presents for her final chemotherapy treatment. She has been managing well with the winter weather and has been taking Vitamin D3 50,000 units weekly, as prescribed by her primary care physician. She reports potential memory issues, which she attributes to chemotherapy. She expresses interest in a small study using game theory to improve memory. She also reports that she has been taking more naps recently.  The patient has a port for chemotherapy administration, which she is planning to have removed. She has not yet received a call to schedule the removal. She recently had a mammogram, which showed no concerning findings. She is also taking anastrozole, with no reported issues.         ALLERGIES:  is allergic to norgesic [orphenadrine-aspirin-caffeine], hydrocodone, oxycodone, and antibacterial hand soap [triclosan].  MEDICATIONS:  Current Outpatient Medications  Medication Sig Dispense Refill   ergocalciferol (VITAMIN D2) 1.25 MG (50000 UT) capsule Take 1 capsule (50,000 Units total) by mouth once a week.     amLODipine (NORVASC) 10 MG tablet Take 10 mg by mouth at bedtime.      anastrozole (ARIMIDEX) 1 MG tablet Take 1 tablet (1 mg total) by mouth daily. 90 tablet 3   Aromatic Inhalants (VICKS VAPOINHALER IN) Inhale 1 puff into the lungs daily as needed (congestion).     atorvastatin (LIPITOR) 10 MG tablet Take 10 mg by mouth daily.     doxazosin (CARDURA) 2 MG tablet      empagliflozin (JARDIANCE) 25 MG TABS tablet Take 25 mg by mouth daily with supper.      ibuprofen (ADVIL) 800 MG tablet Take 800 mg by mouth every 8 (eight) hours as needed for moderate pain.     latanoprost (XALATAN) 0.005 % ophthalmic solution Place 1 drop into both eyes at bedtime.       lidocaine-prilocaine (EMLA) cream Apply to affected area once 30 g 3   loperamide (IMODIUM A-D) 2 MG tablet Take 2 mg by mouth 4 (four) times daily as needed for diarrhea or loose stools.     olmesartan (BENICAR) 40 MG tablet Take 40 mg by mouth daily.     omeprazole (PRILOSEC) 40 MG capsule Take 1 capsule (40 mg total) by mouth daily. (Patient taking differently: Take 40 mg by mouth daily. PRN) 90 capsule 1   oxymetazoline (AFRIN) 0.05 % nasal spray Place 1 spray into both nostrils 2 (two) times daily.     prochlorperazine (COMPAZINE) 10 MG tablet Take 1 tablet (10 mg total) by mouth every 6 (six) hours as needed for nausea or vomiting. 30 tablet 1   rivaroxaban (XARELTO) 20 MG TABS tablet Take 1 tablet (20 mg total) by mouth daily with supper. 90 tablet 3   Semaglutide, 1 MG/DOSE, 2 MG/1.5ML SOPN Inject 1 mg into the skin every Sunday.     spironolactone (ALDACTONE) 25 MG tablet Take 50 mg by mouth daily.     vortioxetine HBr (TRINTELLIX) 10 MG TABS tablet Take 10 mg by mouth daily with supper.     No current facility-administered medications for this visit.    PHYSICAL EXAMINATION: ECOG PERFORMANCE STATUS: 1 -  Symptomatic but completely ambulatory  Vitals:   12/19/23 1049  BP: 131/69  Pulse: 78  Resp: 18  Temp: 98 F (36.7 C)  SpO2: 99%   Filed Weights   12/19/23 1049  Weight: 199 lb 1.6 oz (90.3 kg)    LABORATORY DATA:  I have reviewed the data as listed    Latest Ref Rng & Units 11/07/2023   10:44 AM 10/17/2023    8:19 AM 09/05/2023    8:16 AM  CMP  Glucose 70 - 99 mg/dL 161  096  045   BUN 8 - 23 mg/dL 18  23  12    Creatinine 0.44 - 1.00 mg/dL 4.09  8.11  9.14   Sodium 135 - 145 mmol/L 140  142  141   Potassium 3.5 - 5.1 mmol/L 4.1  4.4  3.7   Chloride 98 - 111 mmol/L 107  107  108   CO2 22 - 32 mmol/L 28  27  26    Calcium 8.9 - 10.3 mg/dL 9.6  9.6  9.0   Total Protein 6.5 - 8.1 g/dL 6.8  7.5  7.1   Total Bilirubin <1.2 mg/dL 0.3  0.3  0.5   Alkaline Phos 38 - 126  U/L 80  89  92   AST 15 - 41 U/L 12  14  13    ALT 0 - 44 U/L 18  17  12      Lab Results  Component Value Date   WBC 3.6 (L) 11/07/2023   HGB 10.6 (L) 11/07/2023   HCT 32.6 (L) 11/07/2023   MCV 87.2 11/07/2023   PLT 214 11/07/2023   NEUTROABS 2.3 11/07/2023    ASSESSMENT & PLAN:  Malignant neoplasm of upper-outer quadrant of left breast in female, estrogen receptor positive (HCC) 10/17/2022 Diffuse bilateral breast pains.  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  Mild hot flash and slight joint stiffness Echocardiogram 09/30/2023: EF 55 to 60%   Breast cancer surveillance:  11/15/2023: Mammogram: [facility]: Benign breast density category B 12/19/2023: Breast exam: Benign  Return to clinic in 1 year for follow-up   ------------------------------------- Assessment and Plan    Breast Cancer Completed  chemotherapy. Last mammogram in the middle of last month was normal. No issues reported with Anastrozole. -Continue Anastrozole as prescribed. -Set up Gardant Reveal blood test for surveillance every six months. -Next follow-up appointment in six months.  Vitamin D Deficiency On Vitamin D3 50,000 units weekly. -Continue Vitamin D3 as prescribed.  Port Removal Port still in place post-chemotherapy. Appointment for removal not yet scheduled. -Follow up on scheduling port  removal with radiology.  Cognitive Concerns Patient reports potential memory issues post-chemotherapy. -Provide information on non-pharmacological study to potentially improve memory.  General Health Maintenance -Continue current medications as prescribed.          No orders of the defined types were placed in this encounter.  The patient has a good understanding of the overall plan. she agrees with it. she will call with any problems that may develop before the next visit here. Total time spent: 30 mins including face to face time and time spent for planning, charting and co-ordination of care   Tamsen Meek, MD 12/19/23

## 2023-12-19 NOTE — Progress Notes (Signed)
Received message from research team stating pt completed a questioner and reported little interest or pleasure in doing things and felling down, depressed, or hopeless. Pt currently on Tintellix prescribed by PCP Dr. Renaye Rakers.  Pt requested RN reach out to Dr. Lora Havens office to alert them.  RN attempt x1.  No answer.  LVM for nurse regarding pt depression and needing office visit to further discuss Trintellix dose.  Referral also placed to social work.

## 2023-12-19 NOTE — Patient Instructions (Signed)
 [facility] - A DEPT OF [facility]  Discharge Instructions: Thank you for choosing [facility] to provide your oncology and hematology care.   If you have a lab appointment with the Cancer Center, please go directly to the Cancer Center and check in at the registration area.   Wear comfortable clothing and clothing appropriate for easy access to any Portacath or PICC line.   We strive to give you quality time with your provider. You may need to reschedule your appointment if you arrive late (15 or more minutes).  Arriving late affects you and other patients whose appointments are after yours.  Also, if you miss three or more appointments without notifying the office, you may be dismissed from the clinic at the provider's discretion.      For prescription refill requests, have your pharmacy contact our office and allow 72 hours for refills to be completed.    Today you received the following chemotherapy and/or immunotherapy agents Kanjinti      To help prevent nausea and vomiting after your treatment, we encourage you to take your nausea medication as directed.  BELOW ARE SYMPTOMS THAT SHOULD BE REPORTED IMMEDIATELY: *FEVER GREATER THAN 100.4 F (38 C) OR HIGHER *CHILLS OR SWEATING *NAUSEA AND VOMITING THAT IS NOT CONTROLLED WITH YOUR NAUSEA MEDICATION *UNUSUAL SHORTNESS OF BREATH *UNUSUAL BRUISING OR BLEEDING *URINARY PROBLEMS (pain or burning when urinating, or frequent urination) *BOWEL PROBLEMS (unusual diarrhea, constipation, pain near the anus) TENDERNESS IN MOUTH AND THROAT WITH OR WITHOUT PRESENCE OF ULCERS (sore throat, sores in mouth, or a toothache) UNUSUAL RASH, SWELLING OR PAIN  UNUSUAL VAGINAL DISCHARGE OR ITCHING   Items with * indicate a potential emergency and should be followed up as soon as possible or go to the Emergency Department if any problems should occur.  Please show the CHEMOTHERAPY ALERT CARD or IMMUNOTHERAPY  ALERT CARD at check-in to the Emergency Department and triage nurse.  Should you have questions after your visit or need to cancel or reschedule your appointment, please contact [facility] - A DEPT OF [facility]  Dept: [phone]  and follow the prompts.  Office hours are 8:00 a.m. to 4:30 p.m. Monday - Friday. Please note that voicemails left after 4:00 p.m. may not be returned until the following business day.  We are closed weekends and major holidays. You have access to a nurse at all times for urgent questions. Please call the main number to the clinic Dept: [phone] and follow the prompts.   For any non-urgent questions, you may also contact your provider using MyChart. We now offer e-Visits for anyone 69 and older to request care online for non-urgent symptoms. For details visit [email or web address].   Also download the MyChart app! Go to the app store, search "MyChart", open the app, select [place], and log in with your MyChart username and password.

## 2023-12-20 ENCOUNTER — Telehealth: Payer: Self-pay | Admitting: Licensed Clinical Social Worker

## 2023-12-20 NOTE — Telephone Encounter (Signed)
[facility] Clinical Social Work  Clinical Social Work was referred by nurse for positive depression screen.  Clinical Social Worker attempted to contact patient by phone to offer support and assess for needs.   No answer. Left VM with direct contact information.     Albie Arizpe E Sanaai Doane, LCSW  Clinical Social Worker [facility]

## 2023-12-22 ENCOUNTER — Other Ambulatory Visit: Payer: Self-pay

## 2023-12-24 ENCOUNTER — Telehealth: Payer: Self-pay | Admitting: Licensed Clinical Social Worker

## 2023-12-24 NOTE — Telephone Encounter (Signed)
[facility] Clinical Social Work  CSW attempted to reach pt x2 per referral from medical team. No answer, left VM. Due to multiple attempts with no answer and messages left, referral will be closed at this time. Pt may be referred back to support services as needed.   Tonya Vincent E Damian Hofstra, LCSW

## 2023-12-25 ENCOUNTER — Ambulatory Visit (HOSPITAL_COMMUNITY)
Admission: RE | Admit: 2023-12-25 | Discharge: 2023-12-25 | Disposition: A | Discharge status: Home / Self Care | Payer: HMO | Source: Ambulatory Visit | Attending: Hematology and Oncology | Admitting: Hematology and Oncology

## 2023-12-25 DIAGNOSIS — Z452 Encounter for adjustment and management of vascular access device: Secondary | ICD-10-CM | POA: Diagnosis not present

## 2023-12-25 DIAGNOSIS — Z170 Estrogen receptor positive status [ER+]: Secondary | ICD-10-CM | POA: Insufficient documentation

## 2023-12-25 DIAGNOSIS — C50412 Malignant neoplasm of upper-outer quadrant of left female breast: Secondary | ICD-10-CM | POA: Diagnosis not present

## 2023-12-25 HISTORY — PX: IR REMOVAL TUN ACCESS W/ PORT W/O FL MOD SED: IMG2290

## 2023-12-25 MED ORDER — LIDOCAINE-EPINEPHRINE 1 %-1:100000 IJ SOLN
20.0000 mL | Freq: Once | INTRAMUSCULAR | Status: AC
  Administered 2023-12-25: 6 mL via INTRADERMAL

## 2023-12-25 MED ORDER — LIDOCAINE-EPINEPHRINE 1 %-1:100000 IJ SOLN
INTRAMUSCULAR | Status: AC
  Filled 2023-12-25: qty 1

## 2023-12-25 NOTE — Procedures (Signed)
Pre Procedural Dx: Poor venous access Post Procedural Dx: Same  Successful removal of anterior chest wall port-a-cath.  EBL: Trace No immediate post procedural complications.   Jay Yareth Kearse, MD Pager #: [phone]  

## 2023-12-26 ENCOUNTER — Telehealth: Payer: Self-pay | Admitting: Licensed Clinical Social Worker

## 2023-12-26 NOTE — Telephone Encounter (Signed)
Received VM from pt returning call regarding referral for depression. Per pt, she was not taking her full prescribed dose of Trintellix (was taking 10mg  instead of 20). She is picking up the correct dose today and feels this will improve her mood. She is aware she can reach out if concerns persist.   [name] E Kanyon Bunn, LCSW

## 2024-01-10 ENCOUNTER — Encounter (HOSPITAL_BASED_OUTPATIENT_CLINIC_OR_DEPARTMENT_OTHER): Payer: Self-pay | Admitting: Family

## 2024-01-10 ENCOUNTER — Other Ambulatory Visit: Payer: Self-pay | Admitting: Cardiology

## 2024-01-10 NOTE — Progress Notes (Signed)
 Date:  01/10/2024  ID:  Tonya Vincent, DOB Nov 13, 1951, MRN [id number] PCP: Benjamine Aland, MD    History of Present Illness: .   Tonya Vincent is a 73 y.o. female with hx of DM2, hypercoagulable state, breast cancer, hypertension.   She was attending an exercise class at the [facility] event when she began to feel hot, dizzy and sat down however progressed to syncope. Participants in the class slowly lowered her to the floor. On site medical providers including myself, Camie Shutter, RN, Dr. Sheena, and Dr. Carson Sharps. She had complete loss of consciousness with some jerking motion. She did not hit her head (is on anticoagulation due to prior clot). She was incontinent of urine. No loss of pulse, after approximately 15 seconds she suddenly woke and screamed in surprise. She was thereafter able to answer questions appropriately.   Her initial BP was 160/76, HR 71, CBG 168. On exam she was diaphoretic. She had not taken her medications that morning nor had anything to eat other than a couple Hers[employer] Kisses. She endorsed no prior syncopal history.   Her family member, Garrie Hasty, was called and updated.   911 was called and on arrival firefighters got BP 110/43. EMS was called and she was transported to EMS van for EKG and further workup. She was encouraged by medial team on site to proceed to ED for further evaluation.       Physical Exam:   VS:  BP   160/76 ? 110/43  HR 71  CBG 168  Wt Readings from Last 3 Encounters:  12/19/23 199 lb 1.6 oz (90.3 kg)  11/28/23 195 lb 1.9 oz (88.5 kg)  11/25/23 194 lb 8 oz (88.2 kg)    GEN: Well nourished, well developed. Diaphoretic NECK: No JVD; No carotid bruits CARDIAC: RRR, no murmurs, rubs, gallops RESPIRATORY:  No audible wheeze ABDOMEN: Soft, non-tender, non-distended EXTREMITIES:  No edema; No deformity  ASSESSMENT AND PLAN: .    Syncope - Episode of syncope while attending [facility] exercise  class. Prodrome of feeling hot, lightheaded. Diaphoretic on exam. Incontinence of urine at time of event raises concern for possible seizure activity. No prior syncope nor seizure. Did not eat, drink, nor take medications prior to the event. Alternate etiology consider orthostasis or vasovagal episode. EMS was appropriately called and she received EKG and further workup in truck, not available to me. She was encouraged to proceed to ED for eval, appears she elected to return home. Echo 09/2023 normal LVEF 55-60%, g1dd, no significant valvular abnormalities to contribute to syncope.  Recommend further workup of syncope with PCP. Consider lab work, carotid duplex, possible referral to neurology if concern for seizure.        Dispo: Will route to primary care team for further evaluation  Signed, Reche GORMAN Finder, NP

## 2024-01-11 LAB — LIPID PANEL W/O CHOL/HDL RATIO
Cholesterol, Total: 153 mg/dL (ref 100–199)
HDL: 68 mg/dL (ref >39)
LDL Chol Calc (NIH): 71 mg/dL (ref 0–99)
Triglycerides: 72 mg/dL (ref 0–149)
VLDL Cholesterol Cal: 14 mg/dL (ref 5–40)

## 2024-01-17 ENCOUNTER — Encounter: Payer: Self-pay | Admitting: Cardiology

## 2024-01-17 DIAGNOSIS — C50011 Malignant neoplasm of nipple and areola, right female breast: Secondary | ICD-10-CM | POA: Diagnosis not present

## 2024-01-17 DIAGNOSIS — Z6834 Body mass index (BMI) 34.0-34.9, adult: Secondary | ICD-10-CM | POA: Diagnosis not present

## 2024-01-17 DIAGNOSIS — R55 Syncope and collapse: Secondary | ICD-10-CM | POA: Diagnosis not present

## 2024-01-17 DIAGNOSIS — F331 Major depressive disorder, recurrent, moderate: Secondary | ICD-10-CM | POA: Diagnosis not present

## 2024-01-17 DIAGNOSIS — E1169 Type 2 diabetes mellitus with other specified complication: Secondary | ICD-10-CM | POA: Diagnosis not present

## 2024-01-17 DIAGNOSIS — I10 Essential (primary) hypertension: Secondary | ICD-10-CM | POA: Diagnosis not present

## 2024-01-22 ENCOUNTER — Other Ambulatory Visit: Payer: Self-pay | Admitting: Adult Health

## 2024-01-25 ENCOUNTER — Encounter: Payer: Self-pay | Admitting: Hematology and Oncology

## 2024-02-05 DIAGNOSIS — E1169 Type 2 diabetes mellitus with other specified complication: Secondary | ICD-10-CM | POA: Diagnosis not present

## 2024-02-05 DIAGNOSIS — I10 Essential (primary) hypertension: Secondary | ICD-10-CM | POA: Diagnosis not present

## 2024-02-05 DIAGNOSIS — H8303 Labyrinthitis, bilateral: Secondary | ICD-10-CM | POA: Diagnosis not present

## 2024-02-19 ENCOUNTER — Telehealth: Payer: Self-pay | Admitting: Adult Health

## 2024-02-19 DIAGNOSIS — F331 Major depressive disorder, recurrent, moderate: Secondary | ICD-10-CM | POA: Diagnosis not present

## 2024-02-19 DIAGNOSIS — F423 Hoarding disorder: Secondary | ICD-10-CM | POA: Diagnosis not present

## 2024-02-19 DIAGNOSIS — E1169 Type 2 diabetes mellitus with other specified complication: Secondary | ICD-10-CM | POA: Diagnosis not present

## 2024-02-19 DIAGNOSIS — C50412 Malignant neoplasm of upper-outer quadrant of left female breast: Secondary | ICD-10-CM | POA: Diagnosis not present

## 2024-02-19 DIAGNOSIS — I10 Essential (primary) hypertension: Secondary | ICD-10-CM | POA: Diagnosis not present

## 2024-02-24 ENCOUNTER — Ambulatory Visit: Payer: Medicare HMO | Attending: Surgery

## 2024-02-24 VITALS — Wt 199.2 lb

## 2024-02-24 DIAGNOSIS — Z483 Aftercare following surgery for neoplasm: Secondary | ICD-10-CM | POA: Insufficient documentation

## 2024-02-24 NOTE — Therapy (Signed)
 OUTPATIENT PHYSICAL THERAPY SOZO SCREENING NOTE   Patient Name: Tonya Vincent MRN: [id number] DOB:Jan 16, 1951, 73 y.o., female Today's Date: 02/24/2024  PCP: Renaye Rakers, MD REFERRING PROVIDER: Abigail Miyamoto, MD   PT End of Session - 02/24/24 [phone]     Visit Number 2   # unchanged due to screen only   PT Start Time 0837    PT Stop Time 0842    PT Time Calculation (min) 5 min    Activity Tolerance Patient tolerated treatment well    Behavior During Therapy [facility] for tasks assessed/performed             Past Medical History:  Diagnosis Date   ADHD (attention deficit hyperactivity disorder)    Anemia    Anxiety    Arthritis    Breast cancer (HCC) 10/2022   left breast ILC   Complication of anesthesia    difficulty waking up   Depression    Diabetes mellitus    type 2 DM   Dyspnea    with exertion   Heart murmur    History of radiation therapy    Left breast- 06/04/23-07/23/23-Dr. Antony Blackbird   History of rectal cancer 10/04/2011   Hypertension    Overactive bladder    Paraspinal mass 12/29/2014   Rectal cancer (HCC) dx'd 11/16/09   xrt comp 01/2010; chemo comp 01/2011   Tubular adenoma of colon 01/08/2014   Past Surgical History:  Procedure Laterality Date   ABDOMINAL HYSTERECTOMY     for uterine fibroids   BREAST LUMPECTOMY WITH RADIOACTIVE SEED LOCALIZATION Left 11/14/2022   Procedure: LEFT BREAST RADIOACTIVE SEED GUIDED LUMPECTOMY x2;  Surgeon: Abigail Miyamoto, MD;  Location: [facility];  Service: General;  Laterality: Left;   COLON SURGERY N/A 04/24/2010   "took out half rectum"; rectal cancer   EYE SURGERY     lasik surgery   HAND SURGERY     carpal tunnel- left   HEMORRHOID SURGERY N/A 1980   IR CV LINE INJECTION  03/22/2023   IR IMAGING GUIDED PORT INSERTION  04/09/2023   IR REMOVAL TUN ACCESS W/ PORT W/O FL MOD SED  04/09/2023   IR REMOVAL TUN ACCESS W/ PORT W/O FL MOD SED  12/25/2023   ORIF ANKLE FRACTURE Right 04/11/2020    Procedure: OPEN REDUCTION INTERNAL FIXATION (ORIF) ANKLE FRACTURE;  Surgeon: Vickki Hearing, MD;  Location: [facility];  Service: Orthopedics;  Laterality: Right;   PORT-A-CATH REMOVAL N/A 02/17/2019   Procedure: REMOVAL PORT-A-CATH;  Surgeon: Abigail Miyamoto, MD;  [facility];  Service: General;  Laterality: N/A;   PORTACATH PLACEMENT     2011   PORTACATH PLACEMENT N/A 12/17/2022   Procedure: INSERTION PORT-A-CATH;  Surgeon: Abigail Miyamoto, MD;  Location: [facility] OR;  Service: General;  Laterality: N/A;   RECTAL SURGERY     2010   SENTINEL NODE BIOPSY Left 11/14/2022   Procedure: SENTINEL LYMPH NODE BIOPSY;  Surgeon: Abigail Miyamoto, MD;  Location: [facility];  Service: General;  Laterality: Left;   WISDOM TOOTH EXTRACTION     Patient Active Problem List   Diagnosis Date Noted   Deep venous thrombosis (HCC) 02/28/2023   Malignant neoplasm of rectum (HCC) 01/18/2023   Port-A-Cath in place 12/27/2022   Genetic testing 11/12/2022   Malignant neoplasm of lower-outer quadrant of left breast of female, estrogen receptor positive (HCC) 10/30/2022   Malignant neoplasm of upper-outer quadrant of left breast in female, estrogen receptor positive (HCC) 10/23/2022   S/P  ORIF (open reduction internal fixation) fracture right ankle 04/11/20 04/21/2020   Closed trimalleolar fracture of right ankle    History of colonic polyps 12/18/2019   Fecal smearing 12/18/2019   Left arm pain 02/04/2019   Rectal bleeding 02/06/2018   Microcytosis 01/01/2017   Essential hypertension 01/02/2016   Change in stool caliber 12/31/2014   Paraspinal mass 12/29/2014   Anemia in chronic illness 12/29/2014   History of rectal cancer 10/04/2011    REFERRING DIAG: left breast cancer at risk for lymphedema  THERAPY DIAG:  Aftercare following surgery for neoplasm  PERTINENT HISTORY: Patient was diagnosed on 10/12/2022 with 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 It measures 4 mm and 8  mm and is located in the upper outer quadrant. The smaller mass is triple positive with a Ki67 of 40%. The larger mass is ER/PR positive and HER2 negative with a Ki67 of 15% . Pt had left lumpectomy with SLNB on 11/14/2022. 1/8 LN's positive. She will be having chemotherapy with Herceptin maintenance, radiation and anti estrogens. There is a deep seroma at the lumpectomy site identified by surgeon with no further treatment required at present per MD. Pt is a part time pharmacist   PRECAUTIONS: left UE Lymphedema risk, None  SUBJECTIVE: Pt returns for her 3 month L-Dex screen.   PAIN:  Are you having pain? No  SOZO SCREENING: Patient was assessed today using the SOZO machine to determine the lymphedema index score. This was compared to her baseline score. It was determined that she is within the recommended range when compared to her baseline and no further action is needed at this time. She will continue SOZO screenings. These are done every 3 months for 2 years post operatively followed by every 6 months for 2 years, and then annually.   L-DEX FLOWSHEETS - 02/24/24 0800       L-DEX LYMPHEDEMA SCREENING   Measurement Type Unilateral    L-DEX MEASUREMENT EXTREMITY Upper Extremity    POSITION  Standing    DOMINANT SIDE Right    At Risk Side Left    BASELINE SCORE (UNILATERAL) 2.5    L-DEX SCORE (UNILATERAL) -2.7    VALUE CHANGE (UNILAT) -5.2              Hermenia Bers, PTA 02/24/2024, 8:41 AM

## 2024-03-09 ENCOUNTER — Encounter: Payer: Self-pay | Admitting: *Deleted

## 2024-03-10 ENCOUNTER — Encounter: Payer: Self-pay | Admitting: Adult Health

## 2024-03-10 ENCOUNTER — Inpatient Hospital Stay: Payer: Self-pay | Attending: Hematology and Oncology | Admitting: Adult Health

## 2024-03-10 ENCOUNTER — Telehealth: Payer: Self-pay | Admitting: *Deleted

## 2024-03-10 ENCOUNTER — Ambulatory Visit (HOSPITAL_COMMUNITY)
Admission: RE | Admit: 2024-03-10 | Discharge: 2024-03-10 | Disposition: A | Discharge status: Home / Self Care | Source: Ambulatory Visit | Attending: Adult Health | Admitting: Adult Health

## 2024-03-10 VITALS — BP 134/57 | HR 80 | Temp 98.1°F | Resp 18 | Ht 63.0 in | Wt 192.4 lb

## 2024-03-10 DIAGNOSIS — Z170 Estrogen receptor positive status [ER+]: Secondary | ICD-10-CM | POA: Diagnosis not present

## 2024-03-10 DIAGNOSIS — C50412 Malignant neoplasm of upper-outer quadrant of left female breast: Secondary | ICD-10-CM

## 2024-03-10 DIAGNOSIS — M85851 Other specified disorders of bone density and structure, right thigh: Secondary | ICD-10-CM | POA: Diagnosis not present

## 2024-03-10 DIAGNOSIS — M25551 Pain in right hip: Secondary | ICD-10-CM | POA: Insufficient documentation

## 2024-03-10 NOTE — Telephone Encounter (Signed)
 Bone Density faxed to [facility]. Receipt of confirmation received.

## 2024-03-10 NOTE — Progress Notes (Unsigned)
 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 after neoadjuvant chemotherapy and radiation therapy   Primary site: Colon and Rectum   Staging method: AJCC 7th Edition   Clinical: Stage I (T2, N0, M0) signed by Artis Delay, MD on 12/28/2013  1:49 PM   Pathologic: Stage I (T2, N0, cM0) signed by Artis Delay, MD on 12/28/2013  1:49 PM   Summary: Stage I (T2, N0, cM0)     History of rectal cancer  11/17/2009 Procedure   Colonoscopy and rectal biopsy confirmed moderately differentiated adenocarcinoma   11/18/2009 Imaging   Transrectal ultrasound place staging T3 lesion   11/18/2009 Imaging   Staging CT scan of the chest, abdomen and pelvis showed multiple lesions in the liver as well as paraspinal mass of unknown etiology   12/05/2009 - 04/03/2010 Chemotherapy   The patient completed new adjuvant chemotherapy with 5-FU and radiation therapy   12/14/2009 Imaging   PET CT scan show faint metabolic activity in the paraspinal mass with no activity in the liver   04/24/2010 Surgery   She had surgical resection with negative margins. Final pathology was T2, N0, M0 (down staged by chemoradiation therapy from T3, N0, M0)   11/19/2012 Imaging   MRI of the liver confirmed benign hemangioma   01/08/2014 Procedure   Colonoscopy and biopsy was negative   12/21/2014 Imaging   CT scan showed no recurrence of colon cancer. Incidentally, paraspinal mass is slightly larger.   10/04/2016 Procedure   She had repeat colonoscopy which showed two 3 mm polyps in the transverse colon, removed with a cold snare. Resected and retrieved. The examination was otherwise normal on direct and retroflexion views.   10/04/2016 Pathology Results   Surgical [P], transverse, polyp(s) - SERRATED POLYP WITH FOCAL FEATURES SUGGESTIVE OF SESSILE SERRATED POLYP/ADENOMA, ONE FRAGMENT. - HYPERPLASTIC POLYP WITHOUT DYSPLASIA, ONE  FRAGMENT. - BENIGN COLORECTAL MUCOSA WITHOUT DYSPLASIA, ONE FRAGMENT. - SEE COMMENT. Microscopic Comment After routine specimen processing, three fragments are identified. One of the fragments demonstrates a serrated horizontally situated crypt, which is suggestive of superficial sampling of a sessile serrated polyp/adenoma (the differential includes another fragment of hyperplastic polyp). Although this is the case, the finding is very focal and not definitive.    11/09/2022 Genetic Testing   Negative genetics for [facility] Multi-Cancer +RNA Panel.  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 Report date is 11/09/2022.   The Multi-Cancer + RNA Panel offered by [facility] includes sequencing and/or deletion/duplication analysis of the following 70 genes:  AIP*, ALK, APC*, ATM*, AXIN2*, BAP1*, BARD1*, BLM*, BMPR1A*, BRCA1*, BRCA2*, BRIP1*, CDC73*, CDH1*, CDK4, CDKN1B*, CDKN2A, CHEK2*, CTNNA1*, DICER1*, EPCAM (del/dup only), EGFR, FH*, FLCN*, GREM1 (promoter dup only), HOXB13, KIT, LZTR1, MAX*, MBD4, MEN1*, MET, MITF, MLH1*, MSH2*, MSH3*, MSH6*, MUTYH*, NF1*, NF2*, NTHL1*, PALB2*, PDGFRA, PMS2*, POLD1*, POLE*, POT1*, PRKAR1A*, PTCH1*, PTEN*, RAD51C*, RAD51D*, RB1*, RET, SDHA* (sequencing only), SDHAF2*, SDHB*, SDHC*, SDHD*, SMAD4*, SMARCA4*, SMARCB1*, SMARCE1*, STK11*, SUFU*, TMEM127*, TP53*, TSC1*, TSC2*, VHL*. RNA analysis is performed for * genes.    11/09/2022 Genetic Testing   Negative genetics for [facility] Multi-Cancer +RNA Panel.  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 Report date is 11/09/2022.   The Multi-Cancer + RNA Panel offered by [facility] includes sequencing and/or deletion/duplication analysis of the following 70 genes:  AIP*, ALK, APC*, ATM*, AXIN2*, BAP1*, BARD1*, BLM*, BMPR1A*, BRCA1*, BRCA2*, BRIP1*, CDC73*, CDH1*, CDK4, CDKN1B*, CDKN2A, CHEK2*, CTNNA1*, DICER1*, EPCAM (del/dup  only), EGFR, FH*, FLCN*, GREM1 (promoter dup only), HOXB13, KIT,  LZTR1, MAX*, MBD4, MEN1*, MET, MITF, MLH1*, MSH2*, MSH3*, MSH6*, MUTYH*, NF1*, NF2*, NTHL1*, PALB2*, PDGFRA, PMS2*, POLD1*, POLE*, POT1*, PRKAR1A*, PTCH1*, PTEN*, RAD51C*, RAD51D*, RB1*, RET, SDHA* (sequencing only), SDHAF2*, SDHB*, SDHC*, SDHD*, SMAD4*, SMARCA4*, SMARCB1*, SMARCE1*, STK11*, SUFU*, TMEM127*, TP53*, TSC1*, TSC2*, VHL*. RNA analysis is performed for * genes.    Malignant neoplasm of upper-outer quadrant of left breast in female, estrogen receptor positive (HCC)  10/17/2022 Initial Diagnosis   Diffuse bilateral breast pains.  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 Report date is 11/09/2022.   The Multi-Cancer + RNA Panel offered by [facility] includes sequencing and/or deletion/duplication analysis of the following 70 genes:  AIP*, ALK, APC*, ATM*, AXIN2*, BAP1*, BARD1*, BLM*, BMPR1A*, BRCA1*, BRCA2*, BRIP1*, CDC73*, CDH1*, CDK4, CDKN1B*, CDKN2A, CHEK2*, CTNNA1*, DICER1*, EPCAM (del/dup only), EGFR, FH*, FLCN*, GREM1 (promoter dup only), HOXB13, KIT, LZTR1, MAX*, MBD4, MEN1*, MET, MITF, MLH1*, MSH2*, MSH3*, MSH6*, MUTYH*, NF1*, NF2*, NTHL1*, PALB2*, PDGFRA, PMS2*, POLD1*, POLE*, POT1*, PRKAR1A*, PTCH1*, PTEN*, RAD51C*, RAD51D*, RB1*, RET, SDHA* (sequencing only), SDHAF2*, SDHB*, SDHC*, SDHD*, SMAD4*, SMARCA4*,  SMARCB1*, SMARCE1*, STK11*, SUFU*, TMEM127*, TP53*, TSC1*, TSC2*, VHL*. RNA analysis is performed for * genes.    11/14/2022 Surgery   Left lumpectomy: Grade 2 ILC with LCIS 3.5 cm, 1/8 lymph node with micrometastases ER 100%, PR 40%, Ki-67 20%, HER2 3+ positive; lymph node prognostic panel: ER 100% PR 20% Ki-67 10%, HER2 2+ by IHC, FISH negative ratio 1.14   06/04/2023 - 07/23/2023 Radiation Therapy   Plan Name: Breast_L_BH Site: Breast, Left Technique: 3D Mode: Photon Dose Per Fraction: 1.8 Gy Prescribed Dose (Delivered / Prescribed): 50.4 Gy / 50.4 Gy Prescribed Fxs (Delivered / Prescribed): 28 / 28   Plan Name: Brst_L_Scv_BH Site: Breast, Left Technique: 3D Mode: Photon Dose Per Fraction: 1.8 Gy Prescribed Dose (Delivered / Prescribed): 50.4 Gy / 50.4 Gy Prescribed Fxs (Delivered / Prescribed): 28 / 28   Plan Name: Brst_L_Bst_BH Site: Breast, Left Technique: 3D Mode: Photon Dose Per Fraction: 2 Gy Prescribed Dose (Delivered / Prescribed): 12 Gy / 12 Gy Prescribed Fxs (Delivered / Prescribed): 6 / 6   07/2023 -  Anti-estrogen oral therapy   Anastrozole   Malignant neoplasm of lower-outer quadrant of left breast of female, estrogen receptor positive (HCC)  10/30/2022 Initial Diagnosis   Malignant neoplasm of lower-outer quadrant of left breast of female, estrogen receptor positive (HCC)   12/27/2022 -  Chemotherapy   Patient is on Treatment Plan : BREAST Docetaxel + Carboplatin + Trastuzumab (TCH) q21d / Trastuzumab q21d       INTERVAL HISTORY:  Tonya Vincent to review her survivorship care plan detailing her treatment course for breast cancer, as well as monitoring long-term side effects of that treatment, education regarding health maintenance, screening, and overall wellness and health  promotion.     Overall, Tonya Vincent reports feeling quite well.  She is taking anastrozole daily and is tolerating it moderately well. She notes some residual difficulty with remembering  things since receiving treatment, but otherwise is feeling well.  She has been experiencing right hip pain.  She wants to know what could be causing this.  REVIEW OF SYSTEMS:  Review of Systems  Constitutional:  Negative for appetite change, chills, fatigue, fever and unexpected weight change.  HENT:   Negative for hearing loss, lump/mass and trouble swallowing.   Eyes:  Negative for eye problems and icterus.  Respiratory:  Negative for chest tightness, cough and shortness of breath.   Cardiovascular:  Negative for chest pain, leg swelling and palpitations.  Gastrointestinal:  Negative for abdominal distention, abdominal pain, constipation, diarrhea, nausea and vomiting.  Endocrine: Negative for hot flashes.  Genitourinary:  Negative for difficulty urinating.   Musculoskeletal:  Negative for arthralgias.  Skin:  Negative for itching and rash.  Neurological:  Negative for dizziness, extremity weakness, headaches and numbness.  Hematological:  Negative for adenopathy. Does not bruise/bleed easily.  Psychiatric/Behavioral:  Negative for depression. The patient is not nervous/anxious.    Breast: Denies any new nodularity, masses, tenderness, nipple changes, or nipple discharge.       PAST MEDICAL/SURGICAL HISTORY:  Past Medical History:  Diagnosis Date   ADHD (attention deficit hyperactivity disorder)    Anemia    Anxiety    Arthritis    Breast cancer (HCC) 10/2022   left breast ILC   Complication of anesthesia    difficulty waking up   Depression    Diabetes mellitus    type 2 DM   Dyspnea    with exertion   Heart murmur    History of radiation therapy    Left breast- 06/04/23-07/23/23-Dr. Antony Blackbird   History of rectal cancer 10/04/2011   Hypertension    Overactive bladder    Paraspinal mass 12/29/2014   Rectal cancer (HCC) dx'd 11/16/09   xrt comp 01/2010; chemo comp 01/2011   Tubular adenoma of colon 01/08/2014   Past Surgical History:  Procedure Laterality Date    ABDOMINAL HYSTERECTOMY     for uterine fibroids   BREAST LUMPECTOMY WITH RADIOACTIVE SEED LOCALIZATION Left 11/14/2022   Procedure: LEFT BREAST RADIOACTIVE SEED GUIDED LUMPECTOMY x2;  Surgeon: Abigail Miyamoto, MD;  Location: [facility];  Service: General;  Laterality: Left;   COLON SURGERY N/A 04/24/2010   "took out half rectum"; rectal cancer   EYE SURGERY     lasik surgery   HAND SURGERY     carpal tunnel- left   HEMORRHOID SURGERY N/A 1980   IR CV LINE INJECTION  03/22/2023   IR IMAGING GUIDED PORT INSERTION  04/09/2023   IR REMOVAL TUN ACCESS W/ PORT W/O FL MOD SED  04/09/2023   IR REMOVAL TUN ACCESS W/ PORT W/O FL MOD SED  12/25/2023   ORIF ANKLE FRACTURE Right 04/11/2020   Procedure: OPEN REDUCTION INTERNAL FIXATION (ORIF) ANKLE FRACTURE;  Surgeon: Vickki Hearing, MD;  Location: [facility];  Service: Orthopedics;  Laterality: Right;   PORT-A-CATH REMOVAL N/A 02/17/2019   Procedure: REMOVAL PORT-A-CATH;  Surgeon: Abigail Miyamoto, MD;  Location: [facility];  Service: General;  Laterality: N/A;   PORTACATH PLACEMENT     2011   PORTACATH PLACEMENT N/A 12/17/2022   Procedure: INSERTION PORT-A-CATH;  Surgeon: Abigail Miyamoto, MD;  Location: [facility];  Service: General;  Laterality: N/A;   RECTAL SURGERY  2010   SENTINEL NODE BIOPSY Left 11/14/2022   Procedure: SENTINEL LYMPH NODE BIOPSY;  Surgeon: Abigail Miyamoto, MD;  Location: [facility];  Service: General;  Laterality: Left;   WISDOM TOOTH EXTRACTION       ALLERGIES:  Allergies  Allergen Reactions   Norgesic [Orphenadrine-Aspirin-Caffeine] Hives   Hydrocodone Hives   Oxycodone Hives   Antibacterial Hand Soap [Triclosan] Dermatitis     CURRENT MEDICATIONS:  Outpatient Encounter Medications as of 03/10/2024  Medication Sig Note   amLODipine (NORVASC) 10 MG tablet Take 10 mg by mouth at bedtime.     anastrozole (ARIMIDEX) 1 MG tablet Take 1 tablet (1 mg total) by mouth daily.    Aromatic Inhalants  (VICKS VAPOINHALER IN) Inhale 1 puff into the lungs daily as needed (congestion).    atorvastatin (LIPITOR) 10 MG tablet Take 10 mg by mouth daily.    doxazosin (CARDURA) 2 MG tablet     empagliflozin (JARDIANCE) 25 MG TABS tablet Take 25 mg by mouth daily with supper.     ergocalciferol (VITAMIN D2) 1.25 MG (50000 UT) capsule Take 1 capsule (50,000 Units total) by mouth once a week.    ibuprofen (ADVIL) 800 MG tablet Take 800 mg by mouth every 8 (eight) hours as needed for moderate pain.    latanoprost (XALATAN) 0.005 % ophthalmic solution Place 1 drop into both eyes at bedtime.     lidocaine-prilocaine (EMLA) cream Apply to affected area once 12/14/2022: Hasn't started   loperamide (IMODIUM A-D) 2 MG tablet Take 2 mg by mouth 4 (four) times daily as needed for diarrhea or loose stools.    olmesartan (BENICAR) 40 MG tablet Take 40 mg by mouth daily.    omeprazole (PRILOSEC) 40 MG capsule TAKE 1 CAPSULE (40 MG TOTAL) BY MOUTH DAILY.    oxymetazoline (AFRIN) 0.05 % nasal spray Place 1 spray into both nostrils 2 (two) times daily.    prochlorperazine (COMPAZINE) 10 MG tablet Take 1 tablet (10 mg total) by mouth every 6 (six) hours as needed for nausea or vomiting. 12/14/2022: Hasn't started   rivaroxaban (XARELTO) 20 MG TABS tablet Take 1 tablet (20 mg total) by mouth daily with supper.    Semaglutide, 1 MG/DOSE, 2 MG/1.5ML SOPN Inject 1 mg into the skin every Sunday.    spironolactone (ALDACTONE) 25 MG tablet Take 50 mg by mouth daily.    vortioxetine HBr (TRINTELLIX) 10 MG TABS tablet Take 10 mg by mouth daily with supper.    No facility-administered encounter medications on file as of 03/10/2024.     ONCOLOGIC FAMILY HISTORY:  Family History  Problem Relation Age of Onset   Leukemia Father 40   Breast cancer Maternal Aunt        dx > 50; mother's maternal half sister   Kidney cancer Maternal Uncle        dx > 50   Breast cancer Paternal Aunt        dx > 50   Breast cancer Cousin         dx > 27; paternal female cousin   Mesothelioma Cousin        paternal female cousin   Colon cancer Neg Hx    Esophageal cancer Neg Hx    Stomach cancer Neg Hx    Rectal cancer Neg Hx      SOCIAL HISTORY:  Social History   Socioeconomic History   Marital status: Single    Spouse name: Not on file   Number  of children: Not on file   Years of education: Not on file   Highest education level: Not on file  Occupational History   Not on file  Tobacco Use   Smoking status: Former    Current packs/day: 0.00    Average packs/day: 0.5 packs/day for 35.0 years (17.5 ttl pk-yrs)    Types: Cigarettes, Cigars    Start date: 12/03/1974    Quit date: 12/03/2009    Years since quitting: 14.2   Smokeless tobacco: Never  Vaping Use   Vaping status: Never Used  Substance and Sexual Activity   Alcohol use: Yes    Alcohol/week: 7.0 standard drinks of alcohol    Types: 7 Cans of beer per week    Comment: socially   Drug use: No   Sexual activity: Not Currently    Birth control/protection: Surgical    Comment: Hyst  Other Topics Concern   Not on file  Social History Narrative   Not on file   Social Drivers of Health   Financial Resource Strain: Not on file  Food Insecurity: No Food Insecurity (05/22/2023)   Hunger Vital Sign    Worried About Running Out of Food in the Last Year: Never true    Ran Out of Food in the Last Year: Never true  Transportation Needs: No Transportation Needs (05/22/2023)   PRAPARE - Administrator, Civil Service (Medical): No    Lack of Transportation (Non-Medical): No  Physical Activity: Not on file  Stress: Not on file  Social Connections: Not on file  Intimate Partner Violence: Not At Risk (05/22/2023)   Humiliation, Afraid, Rape, and Kick questionnaire    Fear of Current or Ex-Partner: No    Emotionally Abused: No    Physically Abused: No    Sexually Abused: No     OBSERVATIONS/OBJECTIVE:  There were no vitals taken for this  visit. GENERAL: Patient is a well appearing female in no acute distress HEENT:  Sclerae anicteric.  Oropharynx clear and moist. No ulcerations or evidence of oropharyngeal candidiasis. Neck is supple.  NODES:  No cervical, supraclavicular, or axillary lymphadenopathy palpated.  BREAST EXAM:  left breast s/p lumpectomy and radiation, no sign of local recurrence right breast benign LUNGS:  Clear to auscultation bilaterally.  No wheezes or rhonchi. HEART:  Regular rate and rhythm. No murmur appreciated. ABDOMEN:  Soft, nontender.  Positive, normoactive bowel sounds. No organomegaly palpated. MSK:  No focal spinal tenderness to palpation. Full range of motion bilaterally in the upper extremities.  Exquisite tenderness to palpation to the right greater trochanter with internal rotation EXTREMITIES:  No peripheral edema.   SKIN:  Clear with no obvious rashes or skin changes. No nail dyscrasia. NEURO:  Nonfocal. Well oriented.  Appropriate affect.   LABORATORY DATA:  None for this visit.  DIAGNOSTIC IMAGING:  None for this visit.      ASSESSMENT AND PLAN:  Ms.. Vincent is a pleasant 73 y.o. female with Stage IA left breast invasive lobular carcinoma, ER+/PR+/HER2+, diagnosed in 10/2022, treated with lumpectomy, adjuvant chemotherapy, adjuvant radiation therapy, maintenance Herceptin therapy, and anti-estrogen therapy with Anastrozole beginning in 07/2023.  She presents to the Survivorship Clinic for our initial meeting and routine follow-up post-completion of treatment for breast cancer.    1. Stage IA left breast cancer:  Tonya Vincent is continuing to recover from definitive treatment for breast cancer. She will follow-up with her medical oncologist, Dr.  Pamelia Hoit in 6 months with history and physical exam per  surveillance protocol.  She will continue her anti-estrogen therapy with Anastrozole. Thus far, she is tolerating the Anastrozole well, with minimal side effects. Her mammogram is due  11/2024; orders placed today.   Today, a comprehensive survivorship care plan and treatment summary was reviewed with the patient today detailing her breast cancer diagnosis, treatment course, potential late/long-term effects of treatment, appropriate follow-up care with recommendations for the future, and patient education resources.  A copy of this summary, along with a letter will be sent to the patient's primary care provider via mail/fax/In Basket message after today's visit.    2. Bone health:  Given Tonya Vincent's age/history of breast cancer and her current treatment regimen including anti-estrogen therapy with Anastrozole, she is at risk for bone demineralization.  We are in the process of obtaining her most recent bone density testing.  I placed orders for repeat testing to occur within the next few months.  She was given education on specific activities to promote bone health.  3. Cancer screening:  Due to Tonya Vincent's history and her age, she should receive screening for skin cancers, colon cancer, and gynecologic cancers.  The information and recommendations are listed on the patient's comprehensive care plan/treatment summary and were reviewed in detail with the patient.    4. Health maintenance and wellness promotion: Tonya Vincent was encouraged to consume 5-7 servings of fruits and vegetables per day. We reviewed the "Nutrition Rainbow" handout.  She was also encouraged to engage in moderate to vigorous exercise for 30 minutes per day most days of the week.  She was instructed to limit her alcohol consumption and continue to abstain from tobacco use.     5. Support services/counseling: It is not uncommon for this period of the patient's cancer care trajectory to be one of many emotions and stressors.   She was given information regarding our available services and encouraged to contact me with any questions or for help enrolling in any of our support group/programs.   6. Hip pain:  Consistent with greater trochanter bursitis.  Will obtain xrays.  Recommend aleve BID x 5 days.  IF it doesn't improve consider sports medicine referral.     Follow up instructions:    -Return to cancer center in 6 months for f/u with Dr. Pamelia Hoit  -Mammogram due in 11/2024 -She is welcome to return back to the Survivorship Clinic at any time; no additional follow-up needed at this time.  -Consider referral back to survivorship as a long-term survivor for continued surveillance  The patient was provided an opportunity to ask questions and all were answered. The patient agreed with the plan and demonstrated an understanding of the instructions.   Total encounter time:45 minutes*in face-to-face visit time, chart review, lab review, care coordination, order entry, and documentation of the encounter time.    Lillard Anes, NP 03/10/24 1:37 PM Medical Oncology and Hematology [facility] [address] [place], [place] [address] Tel. [phone]    Fax. [phone]  *Total Encounter Time as defined by the Centers for Medicare and Medicaid Services includes, in addition to the face-to-face time of a patient visit (documented in the note above) non-face-to-face time: obtaining and reviewing outside history, ordering and reviewing medications, tests or procedures, care coordination (communications with other health care professionals or caregivers) and documentation in the medical record.

## 2024-03-11 ENCOUNTER — Other Ambulatory Visit: Payer: Self-pay

## 2024-03-11 ENCOUNTER — Emergency Department (HOSPITAL_COMMUNITY)
Admission: EM | Admit: 2024-03-11 | Discharge: 2024-03-11 | Disposition: A | Discharge status: Home / Self Care | Attending: Emergency Medicine | Admitting: Emergency Medicine

## 2024-03-11 ENCOUNTER — Encounter (HOSPITAL_COMMUNITY): Payer: Self-pay | Admitting: Emergency Medicine

## 2024-03-11 DIAGNOSIS — R197 Diarrhea, unspecified: Secondary | ICD-10-CM | POA: Diagnosis not present

## 2024-03-11 DIAGNOSIS — R112 Nausea with vomiting, unspecified: Secondary | ICD-10-CM | POA: Diagnosis not present

## 2024-03-11 DIAGNOSIS — R079 Chest pain, unspecified: Secondary | ICD-10-CM | POA: Diagnosis not present

## 2024-03-11 DIAGNOSIS — R1110 Vomiting, unspecified: Secondary | ICD-10-CM | POA: Diagnosis not present

## 2024-03-11 DIAGNOSIS — R7989 Other specified abnormal findings of blood chemistry: Secondary | ICD-10-CM | POA: Diagnosis not present

## 2024-03-11 DIAGNOSIS — R0689 Other abnormalities of breathing: Secondary | ICD-10-CM | POA: Diagnosis not present

## 2024-03-11 DIAGNOSIS — R000 Tachycardia, unspecified: Secondary | ICD-10-CM | POA: Diagnosis not present

## 2024-03-11 LAB — LIPASE, BLOOD: Lipase: 59 U/L — ABNORMAL HIGH (ref 11–51)

## 2024-03-11 LAB — COMPREHENSIVE METABOLIC PANEL WITH GFR
ALT: 19 U/L (ref 0–44)
AST: 21 U/L (ref 15–41)
Albumin: 3.7 g/dL (ref 3.5–5.0)
Alkaline Phosphatase: 82 U/L (ref 38–126)
Anion gap: 14 (ref 5–15)
BUN: 18 mg/dL (ref 8–23)
CO2: 21 mmol/L — ABNORMAL LOW (ref 22–32)
Calcium: 9 mg/dL (ref 8.9–10.3)
Chloride: 101 mmol/L (ref 98–111)
Creatinine, Ser: 1.35 mg/dL — ABNORMAL HIGH (ref 0.44–1.00)
GFR, Estimated: 42 mL/min — ABNORMAL LOW (ref >60)
Glucose, Bld: 199 mg/dL — ABNORMAL HIGH (ref 70–99)
Potassium: 3.7 mmol/L (ref 3.5–5.1)
Sodium: 136 mmol/L (ref 135–145)
Total Bilirubin: 0.4 mg/dL (ref 0.0–1.2)
Total Protein: 7.1 g/dL (ref 6.5–8.1)

## 2024-03-11 LAB — CBC WITH DIFFERENTIAL/PLATELET
Abs Immature Granulocytes: 0.08 10*3/uL — ABNORMAL HIGH (ref 0.00–0.07)
Basophils Absolute: 0 10*3/uL (ref 0.0–0.1)
Basophils Relative: 1 %
Eosinophils Absolute: 0.1 10*3/uL (ref 0.0–0.5)
Eosinophils Relative: 1 %
HCT: 37.4 % (ref 36.0–46.0)
Hemoglobin: 11.8 g/dL — ABNORMAL LOW (ref 12.0–15.0)
Immature Granulocytes: 1 %
Lymphocytes Relative: 15 %
Lymphs Abs: 1.2 10*3/uL (ref 0.7–4.0)
MCH: 26.9 pg (ref 26.0–34.0)
MCHC: 31.6 g/dL (ref 30.0–36.0)
MCV: 85.4 fL (ref 80.0–100.0)
Monocytes Absolute: 0.5 10*3/uL (ref 0.1–1.0)
Monocytes Relative: 6 %
Neutro Abs: 5.9 10*3/uL (ref 1.7–7.7)
Neutrophils Relative %: 76 %
Platelets: 246 10*3/uL (ref 150–400)
RBC: 4.38 MIL/uL (ref 3.87–5.11)
RDW: 15.8 % — ABNORMAL HIGH (ref 11.5–15.5)
WBC: 7.8 10*3/uL (ref 4.0–10.5)
nRBC: 0 % (ref 0.0–0.2)

## 2024-03-11 LAB — TROPONIN I (HIGH SENSITIVITY)
Troponin I (High Sensitivity): <2 ng/L (ref <18)
Troponin I (High Sensitivity): <2 ng/L (ref <18)

## 2024-03-11 MED ORDER — ONDANSETRON HCL 4 MG/2ML IJ SOLN
4.0000 mg | Freq: Once | INTRAMUSCULAR | Status: AC
  Administered 2024-03-11: 4 mg via INTRAVENOUS
  Filled 2024-03-11: qty 2

## 2024-03-11 MED ORDER — ONDANSETRON 4 MG PO TBDP: 4.0000 mg | ORAL_TABLET | Freq: Three times a day (TID) | ORAL | 0 refills | Status: AC | PRN

## 2024-03-11 MED ORDER — SODIUM CHLORIDE 0.9 % IV BOLUS
500.0000 mL | Freq: Once | INTRAVENOUS | Status: AC
  Administered 2024-03-11: 500 mL via INTRAVENOUS

## 2024-03-11 NOTE — ED Provider Notes (Signed)
 [place] EMERGENCY DEPARTMENT AT [facility] Provider Note   CSN: [id number] Arrival date & time: 03/11/24  1333     History  Chief Complaint  Patient presents with   Emesis    Tonya Vincent is a 73 y.o. female.  Patient is a 73 year old female who presents to the emergency department with a chief complaint of onset of vomiting just prior to arrival.  Patient notes that she was sitting at a table after she just finished eating when she became diaphoretic and began vomiting shortly thereafter.  Patient notes that her symptoms greatly improved after the vomiting episode was over.  She has had diarrhea in the emergency department.  She denies any known sick contacts.  She denies any abdominal pain, chest pain or shortness of breath.  She denies any dizziness, lightheadedness or syncope.   Emesis      Home Medications Prior to Admission medications   Medication Sig Start Date End Date Taking? Authorizing Provider  amLODipine (NORVASC) 10 MG tablet Take 10 mg by mouth at bedtime.  01/24/19   [provider]  anastrozole (ARIMIDEX) 1 MG tablet Take 1 tablet (1 mg total) by mouth daily. 07/25/23   Loa Socks, NP  Aromatic Inhalants (VICKS VAPOINHALER IN) Inhale 1 puff into the lungs daily as needed (congestion).    [provider]  atorvastatin (LIPITOR) 10 MG tablet Take 10 mg by mouth daily. 09/30/22   [provider]  doxazosin (CARDURA) 2 MG tablet  06/04/23   [provider]  empagliflozin (JARDIANCE) 25 MG TABS tablet Take 25 mg by mouth daily with supper.     [provider]  ergocalciferol (VITAMIN D2) 1.25 MG (50000 UT) capsule Take 1 capsule (50,000 Units total) by mouth once a week. 12/19/23   [name], MD  ibuprofen (ADVIL) 800 MG tablet Take 800 mg by mouth every 8 (eight) hours as needed for moderate pain.    [provider]  latanoprost (XALATAN) 0.005 % ophthalmic solution Place 1 drop into  both eyes at bedtime.     [provider]  loperamide (IMODIUM A-D) 2 MG tablet Take 2 mg by mouth 4 (four) times daily as needed for diarrhea or loose stools.    [provider]  olmesartan (BENICAR) 40 MG tablet Take 40 mg by mouth daily. 02/28/21   [provider]  omeprazole (PRILOSEC) 40 MG capsule TAKE 1 CAPSULE (40 MG TOTAL) BY MOUTH DAILY. 01/22/24   Loa Socks, NP  oxymetazoline (AFRIN) 0.05 % nasal spray Place 1 spray into both nostrils 2 (two) times daily.    [provider]  spironolactone (ALDACTONE) 25 MG tablet Take 50 mg by mouth daily. 02/28/21   [provider]  vortioxetine HBr (TRINTELLIX) 10 MG TABS tablet Take 10 mg by mouth daily with supper.    [provider]      Allergies    Norgesic [orphenadrine-aspirin-caffeine], Hydrocodone, Oxycodone, and Antibacterial hand soap [triclosan]    Review of Systems   Review of Systems  Gastrointestinal:  Positive for nausea and vomiting.  All other systems reviewed and are negative.   Physical Exam Updated Vital Signs BP (!) 105/45   Pulse 68   SpO2 94%  Physical Exam Vitals and nursing note reviewed.  Constitutional:      Appearance: Normal appearance.  HENT:     Head: Normocephalic and atraumatic.     Nose: Nose normal.     Mouth/Throat:  Mouth: Mucous membranes are moist.  Eyes:     Extraocular Movements: Extraocular movements intact.     Conjunctiva/sclera: Conjunctivae normal.     Pupils: Pupils are equal, round, and reactive to light.  Cardiovascular:     Rate and Rhythm: Normal rate and regular rhythm.     Pulses: Normal pulses.     Heart sounds: Normal heart sounds. No murmur heard.    No gallop.  Pulmonary:     Effort: Pulmonary effort is normal. No respiratory distress.     Breath sounds: Normal breath sounds. No wheezing or rales.  Abdominal:     General: Abdomen is flat. Bowel sounds are normal. There is no distension.      Palpations: Abdomen is soft.     Tenderness: There is no abdominal tenderness. There is no guarding.  Musculoskeletal:        General: Normal range of motion.     Cervical back: Normal range of motion and neck supple.     Right lower leg: No edema.     Left lower leg: No edema.  Skin:    General: Skin is warm and dry.     Findings: No rash.  Neurological:     General: No focal deficit present.     Mental Status: She is alert and oriented to person, place, and time. Mental status is at baseline.  Psychiatric:        Mood and Affect: Mood normal.        Behavior: Behavior normal.        Thought Content: Thought content normal.        Judgment: Judgment normal.     ED Results / Procedures / Treatments   Labs (all labs ordered are listed, but only abnormal results are displayed) Labs Reviewed  CBC WITH DIFFERENTIAL/PLATELET - Abnormal; Notable for the following components:      Result Value   Hemoglobin 11.8 (*)    RDW 15.8 (*)    Abs Immature Granulocytes 0.08 (*)    All other components within normal limits  COMPREHENSIVE METABOLIC PANEL WITH GFR  LIPASE, BLOOD  URINALYSIS, ROUTINE W REFLEX MICROSCOPIC  TROPONIN I (HIGH SENSITIVITY)    EKG None  Radiology DG HIP UNILAT WITH PELVIS 2-3 VIEWS RIGHT Result Date: 03/10/2024 CLINICAL DATA:  Right hip pain for 1 week without known injury. EXAM: DG HIP (WITH OR WITHOUT PELVIS) 2-3V RIGHT COMPARISON:  November 07, 2016. FINDINGS: There is no evidence of hip fracture or dislocation. There is no evidence of arthropathy or other focal bone abnormality. IMPRESSION: Negative. Electronically Signed   By: Lupita Raider M.D.   On: 03/10/2024 16:05    Procedures Procedures    Medications Ordered in ED Medications  sodium chloride 0.9 % bolus 500 mL (has no administration in time range)  ondansetron (ZOFRAN) injection 4 mg (has no administration in time range)    ED Course/ Medical Decision Making/ A&P                                  Medical Decision Making Amount and/or Complexity of Data Reviewed Labs: ordered.  Risk Prescription drug management.   This patient presents to the ED for concern of nausea, vomiting, diarrhea differential diagnosis includes acute viral syndrome, food poisoning, acute appendicitis, cholecystitis, bowel obstruction, diverticulitis, ovarian torsion or cyst, pyelonephritis, kidney stone, ACS, mesenteric ischemia, pancreatitis    Additional history obtained:  Additional history obtained from none External records from outside source obtained and reviewed including none   Lab Tests:  I Ordered, and personally interpreted labs.  The pertinent results include: Elevated creatinine, elevated glucose, elevated lipase, no leukocytosis, no anemia, normal serial troponins   Medicines ordered and prescription drug management:  I ordered medication including IV fluids, Zofran for nausea and vomiting Reevaluation of the patient after these medicines showed that the patient improved I have reviewed the patients home medicines and have made adjustments as needed   Problem List / ED Course:  Patient is doing much better at this time.  Discussed with patient that workup in the emergency department has been unremarkable.  Patient does appear to be dehydrated at this point and was provided with IV fluids.  She has had no further nausea or vomiting in the emergency department and is tolerating p.o. intake without difficulty at this time.  Blood sugar was elevated but patient does not meet criteria for DKA or HHS.  Abdominal exam is benign with no focal tenderness throughout.  Do not suspect acute appendicitis, cholecystitis, bowel torsion, diverticulitis, ovarian torsion or cyst, pyelonephritis, kidney stone, mesenteric ischemia, pancreatitis.  Do not suspect ACS at this time as patient had EKG with no acute ischemic changes and negative serial troponins.  She has had no chest pain or shortness of  breath.  The need for close follow-up with primary care doctor on outpatient basis was discussed as well as strict return precautions for any new or worsening symptoms.  Patient voiced understanding had no additional questions.   Social Determinants of Health:  None           Final Clinical Impression(s) / ED Diagnoses Final diagnoses:  None    Rx / DC Orders ED Discharge Orders     None         Kathlen Mody 03/11/24 1826    Eber Hong, MD 03/12/24 1027

## 2024-03-11 NOTE — ED Notes (Signed)
 Patient discharged. Provider spoke to patient. Paperwork given to patient and reviewed. Pt verbalized understanding. VSS. A+Ox4. Patient ambulated out of the ER with steady, independent gait. Iv removed intact without complications.

## 2024-03-11 NOTE — Discharge Instructions (Signed)
 Please follow-up closely with your primary care doctor on an outpatient basis.  Return to emergency department immediately for any new or worsening symptoms.

## 2024-03-11 NOTE — ED Triage Notes (Signed)
 Pt [facility] with reports of diaphoresis and vomiting. Pt reports [facility] she became sweaty and shortly after vomited for approx 5-10 minutes. Pt reports after vomiting she felt better. Denies pain. Pt was given 4mg  zofran.

## 2024-03-12 ENCOUNTER — Encounter: Payer: Self-pay | Admitting: Adult Health

## 2024-03-12 ENCOUNTER — Telehealth: Payer: Self-pay | Admitting: Internal Medicine

## 2024-03-12 DIAGNOSIS — I10 Essential (primary) hypertension: Secondary | ICD-10-CM | POA: Diagnosis not present

## 2024-03-12 DIAGNOSIS — E1169 Type 2 diabetes mellitus with other specified complication: Secondary | ICD-10-CM | POA: Diagnosis not present

## 2024-03-12 DIAGNOSIS — K51511 Left sided colitis with rectal bleeding: Secondary | ICD-10-CM | POA: Diagnosis not present

## 2024-03-12 DIAGNOSIS — F423 Hoarding disorder: Secondary | ICD-10-CM | POA: Diagnosis not present

## 2024-03-12 NOTE — Telephone Encounter (Signed)
 Please let pt know that she has been scheduled in our first available slot with Quentin Mulling PA 03/23/24 at 1:30pm.

## 2024-03-12 NOTE — Telephone Encounter (Signed)
 Inbound call from patient stating she started having rectal bleeding with mucous. Patient requesting a call from nurse to discuss sooner availability before 4/30. Please advise, thank you.

## 2024-03-16 ENCOUNTER — Telehealth: Payer: Self-pay

## 2024-03-16 NOTE — Telephone Encounter (Signed)
 Called per Alwin Baars, NP, Hip xray is negative. She verbalized understanding.

## 2024-03-19 DIAGNOSIS — M810 Age-related osteoporosis without current pathological fracture: Secondary | ICD-10-CM | POA: Diagnosis not present

## 2024-03-23 ENCOUNTER — Encounter: Payer: Self-pay | Admitting: Adult Health

## 2024-03-23 ENCOUNTER — Ambulatory Visit (INDEPENDENT_AMBULATORY_CARE_PROVIDER_SITE_OTHER): Admitting: Physician Assistant

## 2024-03-23 ENCOUNTER — Encounter: Payer: Self-pay | Admitting: Physician Assistant

## 2024-03-23 ENCOUNTER — Other Ambulatory Visit (INDEPENDENT_AMBULATORY_CARE_PROVIDER_SITE_OTHER)

## 2024-03-23 VITALS — BP 118/78 | HR 83 | Ht 63.0 in | Wt 191.0 lb

## 2024-03-23 DIAGNOSIS — K5900 Constipation, unspecified: Secondary | ICD-10-CM | POA: Diagnosis not present

## 2024-03-23 DIAGNOSIS — K625 Hemorrhage of anus and rectum: Secondary | ICD-10-CM

## 2024-03-23 DIAGNOSIS — E538 Deficiency of other specified B group vitamins: Secondary | ICD-10-CM

## 2024-03-23 DIAGNOSIS — H40023 Open angle with borderline findings, high risk, bilateral: Secondary | ICD-10-CM | POA: Diagnosis not present

## 2024-03-23 DIAGNOSIS — C20 Malignant neoplasm of rectum: Secondary | ICD-10-CM

## 2024-03-23 DIAGNOSIS — D649 Anemia, unspecified: Secondary | ICD-10-CM

## 2024-03-23 DIAGNOSIS — E119 Type 2 diabetes mellitus without complications: Secondary | ICD-10-CM | POA: Diagnosis not present

## 2024-03-23 DIAGNOSIS — R61 Generalized hyperhidrosis: Secondary | ICD-10-CM | POA: Diagnosis not present

## 2024-03-23 DIAGNOSIS — Z7984 Long term (current) use of oral hypoglycemic drugs: Secondary | ICD-10-CM | POA: Diagnosis not present

## 2024-03-23 LAB — CBC WITH DIFFERENTIAL/PLATELET
Basophils Absolute: 0 10*3/uL (ref 0.0–0.1)
Basophils Relative: 1.1 % (ref 0.0–3.0)
Eosinophils Absolute: 0.2 10*3/uL (ref 0.0–0.7)
Eosinophils Relative: 3.9 % (ref 0.0–5.0)
HCT: 37.6 % (ref 36.0–46.0)
Hemoglobin: 11.9 g/dL — ABNORMAL LOW (ref 12.0–15.0)
Lymphocytes Relative: 27.3 % (ref 12.0–46.0)
Lymphs Abs: 1.1 10*3/uL (ref 0.7–4.0)
MCHC: 31.7 g/dL (ref 30.0–36.0)
MCV: 84.4 fl (ref 78.0–100.0)
Monocytes Absolute: 0.5 10*3/uL (ref 0.1–1.0)
Monocytes Relative: 12.9 % — ABNORMAL HIGH (ref 3.0–12.0)
Neutro Abs: 2.3 10*3/uL (ref 1.4–7.7)
Neutrophils Relative %: 54.8 % (ref 43.0–77.0)
Platelets: 252 10*3/uL (ref 150.0–400.0)
RBC: 4.45 Mil/uL (ref 3.87–5.11)
RDW: 17.2 % — ABNORMAL HIGH (ref 11.5–15.5)
WBC: 4.2 10*3/uL (ref 4.0–10.5)

## 2024-03-23 LAB — COMPREHENSIVE METABOLIC PANEL WITH GFR
ALT: 16 U/L (ref 0–35)
AST: 13 U/L (ref 0–37)
Albumin: 4.4 g/dL (ref 3.5–5.2)
Alkaline Phosphatase: 74 U/L (ref 39–117)
BUN: 14 mg/dL (ref 6–23)
CO2: 26 meq/L (ref 19–32)
Calcium: 9.7 mg/dL (ref 8.4–10.5)
Chloride: 102 meq/L (ref 96–112)
Creatinine, Ser: 1.34 mg/dL — ABNORMAL HIGH (ref 0.40–1.20)
GFR: 39.95 mL/min — ABNORMAL LOW (ref >60.00)
Glucose, Bld: 108 mg/dL — ABNORMAL HIGH (ref 70–99)
Potassium: 4.5 meq/L (ref 3.5–5.1)
Sodium: 138 meq/L (ref 135–145)
Total Bilirubin: 0.3 mg/dL (ref 0.2–1.2)
Total Protein: 7.8 g/dL (ref 6.0–8.3)

## 2024-03-23 LAB — IBC + FERRITIN
Ferritin: 87.6 ng/mL (ref 10.0–291.0)
Iron: 55 ug/dL (ref 42–145)
Saturation Ratios: 16.6 % — ABNORMAL LOW (ref 20.0–50.0)
TIBC: 331.8 ug/dL (ref 250.0–450.0)
Transferrin: 237 mg/dL (ref 212.0–360.0)

## 2024-03-23 LAB — VITAMIN B12: Vitamin B-12: 224 pg/mL (ref 211–911)

## 2024-03-23 MED ORDER — NA SULFATE-K SULFATE-MG SULF 17.5-3.13-1.6 GM/177ML PO SOLN: 1.0000 | Freq: Once | ORAL | 0 refills | Status: AC

## 2024-03-23 NOTE — Progress Notes (Signed)
 03/23/2024 Tonya Vincent [id number] 1951-04-09  Referring provider: Jonathon Neighbors, MD Primary GI doctor: Dr. 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 -Will schedule for repeat colonoscopy, 2 day prep at [facility] with Dr. Elvin Hammer We have discussed the risks of bleeding, infection, perforation, medication reactions, and remote risk of death associated with colonoscopy. All questions were answered and the patient acknowledges these risk and wishes to proceed. - given miralax purge and miralax daily before colonoscopy.   Normocyctic anemia 03/11/2024  HGB 11.8 MCV 85.4 Platelets 246 Recent Labs    04/11/23 0745 04/11/23 0826 05/02/23 0842 06/13/23 0748 07/25/23 0818 09/05/23 0816 10/17/23 0819 11/07/23 1044 12/19/23 1138 03/11/24 1415  HGB 11.2* 10.7* 11.1* 11.2* 10.5* 10.9* 11.6* 10.6* 11.2* 11.8*  Check iron, ferritin, B12  Type 2 diabetes On Jardiance and Ozempic Increased to the 2 mg 4 weeks ago, possibly causing her issues Discussed GLP1 with the patient, mechanism of action. Gastroparesis diet given to the patient.  Patient should be instructed to hold this medications if dose falls within 7 days of endoscopic procedure, due to increased risk of retained gastric contents.  Patient Care Team: Jonathon Neighbors, MD as PCP - General (Family Medicine) Almeda Jacobs, MD as Consulting Physician (Hematology and Oncology) Cameron Cea, MD as Consulting Physician (Hematology and Oncology) Oza Blumenthal, MD as Consulting Physician (General Surgery) Retta Caster, MD as Consulting Physician (Radiation Oncology)  HISTORY OF  PRESENT ILLNESS: 73 y.o. female with a past medical history listed below presents for evaluation of rectal bleeding with history malignant neoplasm of the rectum.  Discussed the use of AI scribe software for clinical note transcription with the patient, who gave verbal consent to proceed.  History of Present Illness   Tonya Vincent is a 73 year old female with rectal cancer who presents with rectal bleeding and gastrointestinal symptoms.  She has a history of rectal cancer diagnosed in 2010, treated with chemotherapy and surgery. Her last colonoscopy in February 2021 revealed two tubular adenomatous polyps, which were removed, and radiation proctitis was noted. She is due for her next colonoscopy in February 2026.  Recently, she has experienced a recurrence of rectal bleeding accompanied by mucus discharge. This began after an emergency room visit on March 11, 2024, where she presented with sweating, vomiting, and diarrhea. During this visit, she was found to have slight anemia and a slightly elevated lipase level. The mucus is described as 'tinged with blood' and significant enough to require wearing a pad.  Her bowel movements have slowed, with stools becoming small and pellet-like despite using fiber supplements and consuming yogurt. She experiences these small bowel movements four to five times a day and feels they are incomplete. She has tried using Miralax, but it has not been effective.  She occasionally experiences difficulty swallowing water, feeling as though it gets stuck in her mid-chest, but denies issues with solid foods. She has noticed increased belching recently, suggesting some reflux symptoms.  She is currently on Ozempic for diabetes, which she has been taking for a couple of years, with a recent dose increase to 2 mg four weeks ago. She is also on Jardiance. She denies the use  of Aleve, ibuprofen , or prescription pain medications. No chest discomfort, shortness of breath, or  significant abdominal pain. No rectal pain or discomfort with bowel movements.      She  reports that she quit smoking about 14 years ago. Her smoking use included cigarettes and cigars. She started smoking about 49 years ago. She has a 17.5 pack-year smoking history. She has never used smokeless tobacco. She reports current alcohol use of about 7.0 standard drinks of alcohol per week. She reports that she does not use drugs.  RELEVANT GI HISTORY, IMAGING AND LABS: Results   LABS Troponin: Negative (03/11/2024) Lipase: Mildly elevated (03/11/2024) CBC: Mild anemia, decreased hemoglobin (03/11/2024) Hemoccult: Negative for blood (03/23/2024)  DIAGNOSTIC Colonoscopy: Two tubular adenomatous polyps removed, radiation proctitis (01/2020)      CBC    Component Value Date/Time   WBC 7.8 03/11/2024 1415   RBC 4.38 03/11/2024 1415   HGB 11.8 (L) 03/11/2024 1415   HGB 11.2 (L) 12/19/2023 1138   HGB 12.6 01/01/2017 0841   HCT 37.4 03/11/2024 1415   HCT 39.2 01/01/2017 0841   PLT 246 03/11/2024 1415   PLT 213 12/19/2023 1138   PLT 301 01/01/2017 0841   MCV 85.4 03/11/2024 1415   MCV 78.7 (L) 01/01/2017 0841   MCH 26.9 03/11/2024 1415   MCHC 31.6 03/11/2024 1415   RDW 15.8 (H) 03/11/2024 1415   RDW 17.9 (H) 01/01/2017 0841   LYMPHSABS 1.2 03/11/2024 1415   LYMPHSABS 1.3 01/01/2017 0841   MONOABS 0.5 03/11/2024 1415   MONOABS 0.5 01/01/2017 0841   EOSABS 0.1 03/11/2024 1415   EOSABS 0.1 01/01/2017 0841   BASOSABS 0.0 03/11/2024 1415   BASOSABS 0.0 01/01/2017 0841   Recent Labs    04/11/23 0745 04/11/23 0826 05/02/23 0842 06/13/23 0748 07/25/23 0818 09/05/23 0816 10/17/23 0819 11/07/23 1044 12/19/23 1138 03/11/24 1415  HGB 11.2* 10.7* 11.1* 11.2* 10.5* 10.9* 11.6* 10.6* 11.2* 11.8*    CMP     Component Value Date/Time   NA 136 03/11/2024 1415   NA 141 01/01/2017 0841   K 3.7 03/11/2024 1415   K 3.7 01/01/2017 0841   CL 101 03/11/2024 1415   CL 99 11/05/2012 0842    CO2 21 (L) 03/11/2024 1415   CO2 25 01/01/2017 0841   GLUCOSE 199 (H) 03/11/2024 1415   GLUCOSE 106 01/01/2017 0841   GLUCOSE 167 (H) 11/05/2012 0842   BUN 18 03/11/2024 1415   BUN 18.2 01/01/2017 0841   CREATININE 1.35 (H) 03/11/2024 1415   CREATININE 1.20 (H) 12/19/2023 1138   CREATININE 1.3 (H) 01/01/2017 0841   CALCIUM 9.0 03/11/2024 1415   CALCIUM 9.5 01/01/2017 0841   PROT 7.1 03/11/2024 1415   PROT 7.5 01/01/2017 0841   ALBUMIN 3.7 03/11/2024 1415   ALBUMIN 4.0 01/01/2017 0841   AST 21 03/11/2024 1415   AST 14 (L) 12/19/2023 1138   AST 13 01/01/2017 0841   ALT 19 03/11/2024 1415   ALT 19 12/19/2023 1138   ALT 16 01/01/2017 0841   ALKPHOS 82 03/11/2024 1415   ALKPHOS 89 01/01/2017 0841   BILITOT 0.4 03/11/2024 1415   BILITOT 0.4 12/19/2023 1138   BILITOT 0.29 01/01/2017 0841   GFRNONAA 42 (L) 03/11/2024 1415   GFRNONAA 48 (L) 12/19/2023 1138   GFRAA >60 05/27/2020 1443   GFRAA 52 (L) 01/12/2019 1330      Latest Ref Rng & Units 03/11/2024    2:15 PM 12/19/2023   11:38 AM 11/07/2023   10:44  AM  Hepatic Function  Total Protein 6.5 - 8.1 g/dL 7.1  7.3  6.8   Albumin 3.5 - 5.0 g/dL 3.7  4.1  3.9   AST 15 - 41 U/L 21  14  12    ALT 0 - 44 U/L 19  19  18    Alk Phosphatase 38 - 126 U/L 82  85  80   Total Bilirubin 0.0 - 1.2 mg/dL 0.4  0.4  0.3       Current Medications:   Current Outpatient Medications (Endocrine & Metabolic):    empagliflozin (JARDIANCE) 25 MG TABS tablet, Take 25 mg by mouth daily with supper.   Current Outpatient Medications (Cardiovascular):    amLODipine (NORVASC) 10 MG tablet, Take 10 mg by mouth at bedtime.    atorvastatin (LIPITOR) 10 MG tablet, Take 10 mg by mouth daily.   doxazosin (CARDURA) 2 MG tablet,    olmesartan (BENICAR) 40 MG tablet, Take 40 mg by mouth daily.   spironolactone (ALDACTONE) 25 MG tablet, Take 50 mg by mouth daily.  Current Outpatient Medications (Respiratory):    Aromatic Inhalants (VICKS VAPOINHALER IN), Inhale  1 puff into the lungs daily as needed (congestion).   oxymetazoline  (AFRIN) 0.05 % nasal spray, Place 1 spray into both nostrils 2 (two) times daily.  Current Outpatient Medications (Analgesics):    ibuprofen  (ADVIL ) 800 MG tablet, Take 800 mg by mouth every 8 (eight) hours as needed for moderate pain.   Current Outpatient Medications (Other):    anastrozole  (ARIMIDEX ) 1 MG tablet, Take 1 tablet (1 mg total) by mouth daily.   ergocalciferol  (VITAMIN D2) 1.25 MG (50000 UT) capsule, Take 1 capsule (50,000 Units total) by mouth once a week.   latanoprost (XALATAN) 0.005 % ophthalmic solution, Place 1 drop into both eyes at bedtime.    loperamide (IMODIUM A-D) 2 MG tablet, Take 2 mg by mouth 4 (four) times daily as needed for diarrhea or loose stools.   omeprazole  (PRILOSEC) 40 MG capsule, TAKE 1 CAPSULE (40 MG TOTAL) BY MOUTH DAILY.   ondansetron  (ZOFRAN -ODT) 4 MG disintegrating tablet, Take 1 tablet (4 mg total) by mouth every 8 (eight) hours as needed for nausea or vomiting.   vortioxetine HBr (TRINTELLIX) 10 MG TABS tablet, Take 10 mg by mouth daily with supper.  Medical History:  Past Medical History:  Diagnosis Date   ADHD (attention deficit hyperactivity disorder)    Anemia    Anxiety    Arthritis    Breast cancer (HCC) 10/2022   left breast ILC   Complication of anesthesia    difficulty waking up   Depression    Diabetes mellitus    type 2 DM   Dyspnea    with exertion   Heart murmur    History of radiation therapy    Left breast- 06/04/23-07/23/23-Dr. Retta Caster   History of rectal cancer 10/04/2011   Hypertension    Overactive bladder    Paraspinal mass 12/29/2014   Port-A-Cath in place 12/27/2022   Rectal cancer (HCC) dx'd 11/16/09   xrt comp 01/2010; chemo comp 01/2011   Tubular adenoma of colon 01/08/2014   Allergies:  Allergies  Allergen Reactions   Norgesic [Orphenadrine-Aspirin-Caffeine] Hives   Hydrocodone  Hives   Oxycodone  Hives   Antibacterial Hand  Soap [Triclosan] Dermatitis     Surgical History:  She  has a past surgical history that includes Colon surgery (N/A, 04/24/2010); Hemorrhoid surgery (N/A, 1980); Abdominal hysterectomy; Hand surgery; Portacath placement; Wisdom tooth extraction; Eye surgery; Port-a-cath  removal (N/A, 02/17/2019); Rectal surgery; ORIF ankle fracture (Right, 04/11/2020); Breast lumpectomy with radioactive seed localization (Left, 11/14/2022); Sentinel node biopsy (Left, 11/14/2022); Portacath placement (N/A, 12/17/2022); IR CV Line Injection (03/22/2023); IR REMOVAL TUN ACCESS W/ PORT W/O FL MOD SED (04/09/2023); IR IMAGING GUIDED PORT INSERTION (04/09/2023); and IR REMOVAL TUN ACCESS W/ PORT W/O FL MOD SED (12/25/2023). Family History:  Her family history includes Breast cancer in her cousin, maternal aunt, and paternal aunt; Kidney cancer in her maternal uncle; Leukemia (age of onset: 78) in her father; Mesothelioma in her cousin.  REVIEW OF SYSTEMS  : All other systems reviewed and negative except where noted in the History of Present Illness.  PHYSICAL EXAM: BP 118/78   Pulse 83   Ht 5\' 3"  (1.6 m)   Wt 191 lb (86.6 kg)   BMI 33.83 kg/m  Physical Exam   GENERAL APPEARANCE: Well nourished, in no apparent distress. HEENT: No cervical lymphadenopathy, unremarkable thyroid , sclerae anicteric, conjunctiva pink. RESPIRATORY: Respiratory effort normal, breath sounds equal bilaterally without rales, rhonchi, or wheezing. CARDIO: Regular rate and rhythm with no murmurs, rubs, or gallops, peripheral pulses intact. ABDOMEN: Soft, non-distended, hypoactive bowel sounds, tenderness in right and left lower quadrants, no rebound, no mass appreciated. RECTAL: Yellow mucus on posterior rectum, decreased rectal tone, hard stool present, hemoccult negative for blood, no fissures or abnormalities. MUSCULOSKELETAL: Full range of motion, normal gait, without edema. SKIN: Dry, intact without rashes or lesions. No jaundice. NEURO: Alert,  oriented, no focal deficits. PSYCH: Cooperative, normal mood and affect. EXTREMITIES: No edema.      Edmonia Gottron, PA-C 2:10 PM

## 2024-03-23 NOTE — Progress Notes (Signed)
 Noted.

## 2024-03-23 NOTE — Patient Instructions (Addendum)
 Your provider has requested that you go to the basement level for lab work before leaving today. Press "B" on the elevator. The lab is located at the first door on the left as you exit the elevator.  Please do the following: Purchase a bottle of Miralax over the counter as well as a box of 5 mg dulcolax tablets. Take 4 dulcolax tablets. Wait 1 hour. You will then drink 6-8 capfuls of Miralax mixed in an adequate amount of water/juice/gatorade (you may choose which of these liquids to drink) over the next 2-3 hours. You should expect results within 1 to 6 hours after completing the bowel purge. Go to the er if you have severe AB pain, can not pass gas or stool in over 12 hours, can not hold down any food.  Please do an enema with this  Miralax is an osmotic laxative.  It only brings more water into the stool.  This is safe to take daily.  Can take up to 17 gram of miralax twice a day.  Mix with juice or coffee.  Start 1 capful at night for 3-4 days and reassess your response in 3-4 days.  You can increase and decrease the dose based on your response.  Remember, it can take up to 3-4 days to take effect OR for the effects to wear off.   I often pair this with benefiber in the morning to help assure the stool is not too loose.   Toileting tips to help with your constipation - Drink at least 64-80 ounces of water/liquid per day. - Establish a time to try to move your bowels every day.  For many people, this is after a cup of coffee or after a meal such as breakfast. - Sit all of the way back on the toilet keeping your back fairly straight and while sitting up, try to rest the tops of your forearms on your upper thighs.   - Raising your feet with a step stool/squatty potty can be helpful to improve the angle that allows your stool to pass through the rectum. - Relax the rectum feeling it bulge toward the toilet water.  If you feel your rectum raising toward your body, you are contracting rather  than relaxing. - Breathe in and slowly exhale. "Belly breath" by expanding your belly towards your belly button. Keep belly expanded as you gently direct pressure down and back to the anus.  A low pitched GRRR sound can assist with increasing intra-abdominal pressure.  (Can also trying to blow on a pinwheel and make it move, this helps with the same belly breathing) - Repeat 3-4 times. If unsuccessful, contract the pelvic floor to restore normal tone and get off the toilet.  Avoid excessive straining. - To reduce excessive wiping by teaching your anus to normally contract, place hands on outer aspect of knees and resist knee movement outward.  Hold 5-10 second then place hands just inside of knees and resist inward movement of knees.  Hold 5 seconds.  Repeat a few times each way.  Go to the ER if unable to pass gas, severe AB pain, unable to hold down food, any shortness of breath of chest pain.  You have been scheduled for a colonoscopy. Please follow written instructions given to you at your visit today.   If you use inhalers (even only as needed), please bring them with you on the day of your procedure.  DO NOT TAKE 7 DAYS PRIOR TO TEST- Trulicity (dulaglutide) Ozempic,  Wegovy (semaglutide) Mounjaro (tirzepatide) Bydureon Bcise (exanatide extended release)  DO NOT TAKE 1 DAY PRIOR TO YOUR TEST Rybelsus (semaglutide) Adlyxin (lixisenatide) Victoza (liraglutide) Byetta (exanatide) ___________________________________________________________________________    Due to recent changes in healthcare laws, you may see the results of your imaging and laboratory studies on MyChart before your provider has had a chance to review them.  We understand that in some cases there may be results that are confusing or concerning to you. Not all laboratory results come back in the same time frame and the provider may be waiting for multiple results in order to interpret others.  Please give us  48 hours in  order for your provider to thoroughly review all the results before contacting the office for clarification of your results.   I appreciate the  opportunity to care for you  Thank You   [facility]

## 2024-04-02 ENCOUNTER — Telehealth: Payer: Self-pay

## 2024-04-02 NOTE — Telephone Encounter (Signed)
-----   Message from Nurse Porsche C sent at 03/13/2024 10:29 AM EDT ----- I called this pt yesterday and she didn't answer. Can you call when you get time

## 2024-04-02 NOTE — Telephone Encounter (Signed)
 Letter was sent out 5/1 in regarding to pt for x-ray results. Pt was advise to contact office for results.

## 2024-04-03 ENCOUNTER — Telehealth: Payer: Self-pay

## 2024-04-03 NOTE — Telephone Encounter (Addendum)
-----   Message from Nurse Porsche C sent at 03/13/2024 10:29 AM EDT ----- I called this pt yesterday and she didn't answer. Can you call when you get time

## 2024-04-08 NOTE — Progress Notes (Signed)
   04/08/2024  Patient ID: Tonya Vincent, female   DOB: Oct 28, 1951, 73 y.o.   MRN: [id number]  Contacted patient regarding medication adherence from a quality report for [facility]. The patient failed [facility] in 2024.     Per DrFirst fill history:  Jardiance 25 mg - last filled 03/27/24 for a 90-day supply. Atorvastatin 10 mg - last filled 02/28/24 for a 90-day supply. Olmesartan 40 mg - last filled 02/28/24 for a 90-day supply. Semaglutide 2 mg - last filled 02/28/24 for a 84-day supply.   I will follow up for adherence monitoring.   Thank you for allowing pharmacy to be a part of this patient's care.    Livia Riffle, PharmD Clinical Pharmacist  [phone]

## 2024-04-10 NOTE — Progress Notes (Signed)
   04/10/2024  Patient ID: Tonya Vincent, female   DOB: Nov 04, 1951, 73 y.o.   MRN: [id number]  Patient on PAP re-enrollment roster. Called pharmacy to verify patient has zero dollar copay for Ozempic and Jardiance, pharmacy confirmed.    Thank you for allowing pharmacy to be a part of this patient's care.    Livia Riffle, PharmD Clinical Pharmacist  [phone]

## 2024-04-16 ENCOUNTER — Telehealth: Payer: Self-pay

## 2024-04-16 ENCOUNTER — Other Ambulatory Visit (HOSPITAL_COMMUNITY): Payer: Self-pay

## 2024-04-16 NOTE — Telephone Encounter (Signed)
 Pharmacy Patient Advocate Encounter  Insurance verification completed.   The patient is insured through [facility] ADVANTAGE/RX ADVANCE   Ran test claim for Ozempic. Currently a quantity of 9ml is a 84 day supply and the co-pay is $0 .   This test claim was processed through [facility]- copay amounts may vary at other pharmacies due to pharmacy/plan contracts, or as the patient moves through the different stages of their insurance plan.   Pharmacy Patient Advocate Encounter  Insurance verification completed.   The patient is insured through [facility] ADVANTAGE/RX ADVANCE   Ran test claim for Jardiance. Currently a quantity of 90 is a 90 day supply and the co-pay is $0 .   This test claim was processed through [facility]- copay amounts may vary at other pharmacies due to pharmacy/plan contracts, or as the patient moves through the different stages of their insurance plan.

## 2024-05-05 ENCOUNTER — Encounter: Payer: Self-pay | Admitting: Internal Medicine

## 2024-05-05 ENCOUNTER — Ambulatory Visit (AMBULATORY_SURGERY_CENTER): Admitting: Internal Medicine

## 2024-05-05 VITALS — BP 155/62 | HR 72 | Temp 98.1°F | Resp 16 | Ht 63.0 in | Wt 191.0 lb

## 2024-05-05 DIAGNOSIS — Z85038 Personal history of other malignant neoplasm of large intestine: Secondary | ICD-10-CM

## 2024-05-05 DIAGNOSIS — Y842 Radiological procedure and radiotherapy as the cause of abnormal reaction of the patient, or of later complication, without mention of misadventure at the time of the procedure: Secondary | ICD-10-CM

## 2024-05-05 DIAGNOSIS — K625 Hemorrhage of anus and rectum: Secondary | ICD-10-CM | POA: Diagnosis not present

## 2024-05-05 DIAGNOSIS — Z1211 Encounter for screening for malignant neoplasm of colon: Secondary | ICD-10-CM | POA: Diagnosis not present

## 2024-05-05 DIAGNOSIS — Z860100 Personal history of colon polyps, unspecified: Secondary | ICD-10-CM

## 2024-05-05 DIAGNOSIS — Z860101 Personal history of adenomatous and serrated colon polyps: Secondary | ICD-10-CM | POA: Diagnosis not present

## 2024-05-05 DIAGNOSIS — D649 Anemia, unspecified: Secondary | ICD-10-CM

## 2024-05-05 DIAGNOSIS — K627 Radiation proctitis: Secondary | ICD-10-CM | POA: Diagnosis not present

## 2024-05-05 MED ORDER — SODIUM CHLORIDE 0.9 % IV SOLN: 500.0000 mL | Freq: Once | INTRAVENOUS | Status: DC

## 2024-05-05 NOTE — Progress Notes (Signed)
 Expand All Collapse All        03/23/2024 Tonya Vincent [id number] [birth year]   Referring provider: Jonathon Neighbors, MD Primary GI doctor: Dr. 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 -Will schedule for repeat colonoscopy, 2 day prep at [facility] with Dr. Elvin Hammer We have discussed the risks of bleeding, infection, perforation, medication reactions, and remote risk of death associated with colonoscopy. All questions were answered and the patient acknowledges these risk and wishes to proceed. - given miralax purge and miralax daily before colonoscopy.    Normocyctic anemia 03/11/2024  HGB 11.8 MCV 85.4 Platelets 246 Recent Labs (within last 365 days)              Recent Labs    04/11/23 0745 04/11/23 0826 05/02/23 0842 06/13/23 0748 07/25/23 0818 09/05/23 0816 10/17/23 0819 11/07/23 1044 12/19/23 1138 03/11/24 1415  HGB 11.2* 10.7* 11.1* 11.2* 10.5* 10.9* 11.6* 10.6* 11.2* 11.8*    Check iron, ferritin, B12   Type 2 diabetes On Jardiance and Ozempic Increased to the 2 mg 4 weeks ago, possibly causing her issues Discussed GLP1 with the patient, mechanism of action. Gastroparesis diet given to the patient.  Patient should be instructed to hold this medications if dose falls within 7 days of endoscopic procedure, due to increased risk of retained gastric contents.   Patient Care Team: Jonathon Neighbors, MD as PCP - General (Family Medicine) Almeda Jacobs, MD as Consulting Physician (Hematology and Oncology) Cameron Cea, MD as Consulting Physician (Hematology and Oncology) Oza Blumenthal, MD as Consulting Physician (General  Surgery) Retta Caster, MD as Consulting Physician (Radiation Oncology)   HISTORY OF PRESENT ILLNESS: 73 y.o. female with a past medical history listed below presents for evaluation of rectal bleeding with history malignant neoplasm of the rectum.   Discussed the use of AI scribe software for clinical note transcription with the patient, who gave verbal consent to proceed.   History of Present Illness   Tonya Vincent is a 73 year old female with rectal cancer who presents with rectal bleeding and gastrointestinal symptoms.   She has a history of rectal cancer diagnosed in 2010, treated with chemotherapy and surgery. Her last colonoscopy in February 2021 revealed two tubular adenomatous polyps, which were removed, and radiation proctitis was noted. She is due for her next colonoscopy in February 2026.   Recently, she has experienced a recurrence of rectal bleeding accompanied by mucus discharge. This began after an emergency room visit on March 11, 2024, where she presented with sweating, vomiting, and diarrhea. During this visit, she was found to have slight anemia and a slightly elevated lipase level. The mucus is described as 'tinged with blood' and significant enough to require wearing a pad.   Her bowel movements have slowed, with stools becoming small and pellet-like despite using fiber supplements and consuming yogurt. She experiences these small bowel movements four to five times a day and feels they are incomplete. She has tried using Miralax, but it has not been effective.   She occasionally experiences difficulty swallowing water, feeling as though it gets stuck in her mid-chest, but denies issues with solid foods. She has noticed increased belching recently,  suggesting some reflux symptoms.   She is currently on Ozempic for diabetes, which she has been taking for a couple of years, with a recent dose increase to 2 mg four weeks ago. She is also on Jardiance. She denies the use of  Aleve, ibuprofen , or prescription pain medications. No chest discomfort, shortness of breath, or significant abdominal pain. No rectal pain or discomfort with bowel movements.       She  reports that she quit smoking about 14 years ago. Her smoking use included cigarettes and cigars. She started smoking about 49 years ago. She has a 17.5 pack-year smoking history. She has never used smokeless tobacco. She reports current alcohol use of about 7.0 standard drinks of alcohol per week. She reports that she does not use drugs.   RELEVANT GI HISTORY, IMAGING AND LABS: Results   LABS Troponin: Negative (03/11/2024) Lipase: Mildly elevated (03/11/2024) CBC: Mild anemia, decreased hemoglobin (03/11/2024) Hemoccult: Negative for blood (03/23/2024)   DIAGNOSTIC Colonoscopy: Two tubular adenomatous polyps removed, radiation proctitis (01/2020)       CBC Labs (Brief)          Component Value Date/Time    WBC 7.8 03/11/2024 1415    RBC 4.38 03/11/2024 1415    HGB 11.8 (L) 03/11/2024 1415    HGB 11.2 (L) 12/19/2023 1138    HGB 12.6 01/01/2017 0841    HCT 37.4 03/11/2024 1415    HCT 39.2 01/01/2017 0841    PLT 246 03/11/2024 1415    PLT 213 12/19/2023 1138    PLT 301 01/01/2017 0841    MCV 85.4 03/11/2024 1415    MCV 78.7 (L) 01/01/2017 0841    MCH 26.9 03/11/2024 1415    MCHC 31.6 03/11/2024 1415    RDW 15.8 (H) 03/11/2024 1415    RDW 17.9 (H) 01/01/2017 0841    LYMPHSABS 1.2 03/11/2024 1415    LYMPHSABS 1.3 01/01/2017 0841    MONOABS 0.5 03/11/2024 1415    MONOABS 0.5 01/01/2017 0841    EOSABS 0.1 03/11/2024 1415    EOSABS 0.1 01/01/2017 0841    BASOSABS 0.0 03/11/2024 1415    BASOSABS 0.0 01/01/2017 0841      Recent Labs (within last 365 days)              Recent Labs    04/11/23 0745 04/11/23 0826 05/02/23 0842 06/13/23 0748 07/25/23 0818 09/05/23 0816 10/17/23 0819 11/07/23 1044 12/19/23 1138 03/11/24 1415  HGB 11.2* 10.7* 11.1* 11.2* 10.5* 10.9* 11.6* 10.6* 11.2*  11.8*        CMP     Labs (Brief)          Component Value Date/Time    NA 136 03/11/2024 1415    NA 141 01/01/2017 0841    K 3.7 03/11/2024 1415    K 3.7 01/01/2017 0841    CL 101 03/11/2024 1415    CL 99 11/05/2012 0842    CO2 21 (L) 03/11/2024 1415    CO2 25 01/01/2017 0841    GLUCOSE 199 (H) 03/11/2024 1415    GLUCOSE 106 01/01/2017 0841    GLUCOSE 167 (H) 11/05/2012 0842    BUN 18 03/11/2024 1415    BUN 18.2 01/01/2017 0841    CREATININE 1.35 (H) 03/11/2024 1415    CREATININE 1.20 (H) 12/19/2023 1138    CREATININE 1.3 (H) 01/01/2017 0841    CALCIUM 9.0 03/11/2024 1415    CALCIUM 9.5 01/01/2017 0841    PROT 7.1 03/11/2024 1415  PROT 7.5 01/01/2017 0841    ALBUMIN 3.7 03/11/2024 1415    ALBUMIN 4.0 01/01/2017 0841    AST 21 03/11/2024 1415    AST 14 (L) 12/19/2023 1138    AST 13 01/01/2017 0841    ALT 19 03/11/2024 1415    ALT 19 12/19/2023 1138    ALT 16 01/01/2017 0841    ALKPHOS 82 03/11/2024 1415    ALKPHOS 89 01/01/2017 0841    BILITOT 0.4 03/11/2024 1415    BILITOT 0.4 12/19/2023 1138    BILITOT 0.29 01/01/2017 0841    GFRNONAA 42 (L) 03/11/2024 1415    GFRNONAA 48 (L) 12/19/2023 1138    GFRAA >60 05/27/2020 1443    GFRAA 52 (L) 01/12/2019 1330          Latest Ref Rng & Units 03/11/2024    2:15 PM 12/19/2023   11:38 AM 11/07/2023   10:44 AM  Hepatic Function  Total Protein 6.5 - 8.1 g/dL 7.1  7.3  6.8   Albumin 3.5 - 5.0 g/dL 3.7  4.1  3.9   AST 15 - 41 U/L 21  14  12    ALT 0 - 44 U/L 19  19  18    Alk Phosphatase 38 - 126 U/L 82  85  80   Total Bilirubin 0.0 - 1.2 mg/dL 0.4  0.4  0.3       Current Medications:    Current Outpatient Medications (Endocrine & Metabolic):    empagliflozin (JARDIANCE) 25 MG TABS tablet, Take 25 mg by mouth daily with supper.    Current Outpatient Medications (Cardiovascular):    amLODipine (NORVASC) 10 MG tablet, Take 10 mg by mouth at bedtime.    atorvastatin (LIPITOR) 10 MG tablet, Take 10 mg by mouth daily.    doxazosin (CARDURA) 2 MG tablet,    olmesartan (BENICAR) 40 MG tablet, Take 40 mg by mouth daily.   spironolactone (ALDACTONE) 25 MG tablet, Take 50 mg by mouth daily.   Current Outpatient Medications (Respiratory):    Aromatic Inhalants (VICKS VAPOINHALER IN), Inhale 1 puff into the lungs daily as needed (congestion).   oxymetazoline  (AFRIN) 0.05 % nasal spray, Place 1 spray into both nostrils 2 (two) times daily.   Current Outpatient Medications (Analgesics):    ibuprofen  (ADVIL ) 800 MG tablet, Take 800 mg by mouth every 8 (eight) hours as needed for moderate pain.     Current Outpatient Medications (Other):    anastrozole  (ARIMIDEX ) 1 MG tablet, Take 1 tablet (1 mg total) by mouth daily.   ergocalciferol  (VITAMIN D2) 1.25 MG (50000 UT) capsule, Take 1 capsule (50,000 Units total) by mouth once a week.   latanoprost (XALATAN) 0.005 % ophthalmic solution, Place 1 drop into both eyes at bedtime.    loperamide (IMODIUM A-D) 2 MG tablet, Take 2 mg by mouth 4 (four) times daily as needed for diarrhea or loose stools.   omeprazole  (PRILOSEC) 40 MG capsule, TAKE 1 CAPSULE (40 MG TOTAL) BY MOUTH DAILY.   ondansetron  (ZOFRAN -ODT) 4 MG disintegrating tablet, Take 1 tablet (4 mg total) by mouth every 8 (eight) hours as needed for nausea or vomiting.   vortioxetine HBr (TRINTELLIX) 10 MG TABS tablet, Take 10 mg by mouth daily with supper.   Medical History:      Past Medical History:  Diagnosis Date   ADHD (attention deficit hyperactivity disorder)     Anemia     Anxiety     Arthritis     Breast cancer (HCC) 10/2022  left breast ILC   Complication of anesthesia      difficulty waking up   Depression     Diabetes mellitus      type 2 DM   Dyspnea      with exertion   Heart murmur     History of radiation therapy      Left breast- 06/04/23-07/23/23-Dr. Retta Caster   History of rectal cancer 10/04/2011   Hypertension     Overactive bladder     Paraspinal mass 12/29/2014    Port-A-Cath in place 12/27/2022   Rectal cancer (HCC) dx'd 11/16/09    xrt comp 01/2010; chemo comp 01/2011   Tubular adenoma of colon 01/08/2014        Allergies:  Allergies      Allergies  Allergen Reactions   Norgesic [Orphenadrine-Aspirin-Caffeine] Hives   Hydrocodone  Hives   Oxycodone  Hives   Antibacterial Hand Soap [Triclosan] Dermatitis        Surgical History:  She  has a past surgical history that includes Colon surgery (N/A, 04/24/2010); Hemorrhoid surgery (N/A, 1980); Abdominal hysterectomy; Hand surgery; Portacath placement; Wisdom tooth extraction; Eye surgery; Port-a-cath removal (N/A, 02/17/2019); Rectal surgery; ORIF ankle fracture (Right, 04/11/2020); Breast lumpectomy with radioactive seed localization (Left, 11/14/2022); Sentinel node biopsy (Left, 11/14/2022); Portacath placement (N/A, 12/17/2022); IR CV Line Injection (03/22/2023); IR REMOVAL TUN ACCESS W/ PORT W/O FL MOD SED (04/09/2023); IR IMAGING GUIDED PORT INSERTION (04/09/2023); and IR REMOVAL TUN ACCESS W/ PORT W/O FL MOD SED (12/25/2023). Family History:  Her family history includes Breast cancer in her cousin, maternal aunt, and paternal aunt; Kidney cancer in her maternal uncle; Leukemia (age of onset: 52) in her father; Mesothelioma in her cousin.   REVIEW OF SYSTEMS  : All other systems reviewed and negative except where noted in the History of Present Illness.   PHYSICAL EXAM: BP 118/78   Pulse 83   Ht 5\' 3"  (1.6 m)   Wt 191 lb (86.6 kg)   BMI 33.83 kg/m  Physical Exam   GENERAL APPEARANCE: Well nourished, in no apparent distress. HEENT: No cervical lymphadenopathy, unremarkable thyroid , sclerae anicteric, conjunctiva pink. RESPIRATORY: Respiratory effort normal, breath sounds equal bilaterally without rales, rhonchi, or wheezing. CARDIO: Regular rate and rhythm with no murmurs, rubs, or gallops, peripheral pulses intact. ABDOMEN: Soft, non-distended, hypoactive bowel sounds, tenderness in right and left  lower quadrants, no rebound, no mass appreciated. RECTAL: Yellow mucus on posterior rectum, decreased rectal tone, hard stool present, hemoccult negative for blood, no fissures or abnormalities. MUSCULOSKELETAL: Full range of motion, normal gait, without edema. SKIN: Dry, intact without rashes or lesions. No jaundice. NEURO: Alert, oriented, no focal deficits. PSYCH: Cooperative, normal mood and affect. EXTREMITIES: No edema.       Edmonia Gottron, PA-C 2:10 PM        Recent H&P as above.  Not for colonoscopy.  No interval change.

## 2024-05-05 NOTE — Patient Instructions (Signed)
-  repeat colonoscopy for surveillance recommended in 5 years -Continue present medications.   YOU HAD AN ENDOSCOPIC PROCEDURE TODAY AT THE [facility]:   Refer to the procedure report that was given to you for any specific questions about what was found during the examination.  If the procedure report does not answer your questions, please call your gastroenterologist to clarify.  If you requested that your care partner not be given the details of your procedure findings, then the procedure report has been included in a sealed envelope for you to review at your convenience later.  YOU SHOULD EXPECT: Some feelings of bloating in the abdomen. Passage of more gas than usual.  Walking can help get rid of the air that was put into your GI tract during the procedure and reduce the bloating. If you had a lower endoscopy (such as a colonoscopy or flexible sigmoidoscopy) you may notice spotting of blood in your stool or on the toilet paper. If you underwent a bowel prep for your procedure, you may not have a normal bowel movement for a few days.  Please Note:  You might notice some irritation and congestion in your nose or some drainage.  This is from the oxygen used during your procedure.  There is no need for concern and it should clear up in a day or so.  SYMPTOMS TO REPORT IMMEDIATELY:  Following lower endoscopy (colonoscopy or flexible sigmoidoscopy):  Excessive amounts of blood in the stool  Significant tenderness or worsening of abdominal pains  Swelling of the abdomen that is new, acute  Fever of 100F or higher  For urgent or emergent issues, a gastroenterologist can be reached at any hour by calling [phone]. Do not use MyChart messaging for urgent concerns.    DIET:  We do recommend a small meal at first, but then you may proceed to your regular diet.  Drink plenty of fluids but you should avoid alcoholic beverages for 24 hours.  ACTIVITY:  You should plan to take it easy  for the rest of today and you should NOT DRIVE or use heavy machinery until tomorrow (because of the sedation medicines used during the test).    FOLLOW UP: Our staff will call the number listed on your records the next business day following your procedure.  We will call around 7:15- 8:00 am to check on you and address any questions or concerns that you may have regarding the information given to you following your procedure. If we do not reach you, we will leave a message.     If any biopsies were taken you will be contacted by phone or by letter within the next 1-3 weeks.  Please call us  at [phone] if you have not heard about the biopsies in 3 weeks.    SIGNATURES/CONFIDENTIALITY: You and/or your care partner have signed paperwork which will be entered into your electronic medical record.  These signatures attest to the fact that that the information above on your After Visit Summary has been reviewed and is understood.  Full responsibility of the confidentiality of this discharge information lies with you and/or your care-partner.

## 2024-05-05 NOTE — Progress Notes (Signed)
 [facility]  Pt's states no medical or surgical changes since previsit or office visit.

## 2024-05-05 NOTE — Op Note (Signed)
  [facility] Patient Name: Tonya Vincent Procedure Date: 05/05/2024 8:55 AM MRN: [id number] Endoscopist: Murel Arlington. Elvin Hammer , MD, [id number] Age: 73 Referring MD:  Date of Birth: 05/29/1951 Gender: Female Account #: [id number] Procedure:                Colonoscopy Indications:              High risk colon cancer surveillance: Personal                            history of non-advanced adenoma, High risk colon                            cancer surveillance: Personal history of sessile                            serrated colon polyp (less than 10 mm in size) with                            no dysplasia, High risk colon cancer surveillance:                            Personal history of colon cancer. Rectal cancer                            2010 (Dr. Nickey Barn). Underwent neoadjuvant therapy.                            Subsequent examinations 2015, 2017, 2021. Seen in                            the office recently for rectal bleeding. Resolved. Medicines:                Monitored Anesthesia Care Procedure:                Pre-Anesthesia Assessment:                           - Prior to the procedure, a History and Physical                            was performed, and patient medications and                            allergies were reviewed. The patient's tolerance of                            previous anesthesia was also reviewed. The risks                            and benefits of the procedure and the sedation                            options and risks were discussed with the patient.  All questions were answered, and informed consent                            was obtained. Prior Anticoagulants: The patient has                            taken no anticoagulant or antiplatelet agents. ASA                            Grade Assessment: II - A patient with mild systemic                            disease. After reviewing the risks and benefits,                             the patient was deemed in satisfactory condition to                            undergo the procedure.                           After obtaining informed consent, the colonoscope                            was passed under direct vision. Throughout the                            procedure, the patient's blood pressure, pulse, and                            oxygen saturations were monitored continuously. The                            CF [id number] #[id number] was introduced through the anus                            and advanced to the the cecum, identified by                            appendiceal orifice and ileocecal valve. The                            terminal ileum, ileocecal valve, appendiceal                            orifice, and rectum were photographed. The quality                            of the bowel preparation was excellent. The                            colonoscopy was performed without difficulty. The  patient tolerated the procedure well. The bowel                            preparation used was SUPREP via split dose                            instruction. Scope In: 9:11:35 AM Scope Out: 9:23:14 AM Scope Withdrawal Time: 0 hours 8 minutes 20 seconds  Total Procedure Duration: 0 hours 11 minutes 39 seconds  Findings:                 The terminal ileum appeared normal.                           The rectal revealed mild distal radiation proctitis                            without bleeding. The exam was otherwise without                            abnormality. Retroflexion was not performed due to                            narrow rectal vault. However, excellent view from                            anal os photographed. Complications:            No immediate complications. Estimated blood loss:                            None. Estimated Blood Loss:     Estimated blood loss: none. Impression:               - The examined portion of the  ileum was normal.                            Mild radiation proctitis                           - The examination was otherwise normal .                           - No specimens collected. Recommendation:           - Repeat colonoscopy in 5 years for surveillance.                           - Patient has a contact number available for                            emergencies. The signs and symptoms of potential                            delayed complications were discussed with the  patient. Return to normal activities tomorrow.                            Written discharge instructions were provided to the                            patient.                           - Resume previous diet.                           - Continue present medications. Murel Arlington. Elvin Hammer, MD 05/05/2024 9:32:30 AM This report has been signed electronically.

## 2024-05-05 NOTE — Progress Notes (Signed)
 Pt A/O x 3, gd SR's, pleased with anesthesia, report to RN

## 2024-05-06 ENCOUNTER — Telehealth: Payer: Self-pay

## 2024-05-06 NOTE — Telephone Encounter (Signed)
 No answer, left message to call if having any issues or concerns, B.Vale Mousseau RN

## 2024-05-18 ENCOUNTER — Other Ambulatory Visit: Payer: Self-pay | Admitting: Hematology and Oncology

## 2024-05-25 ENCOUNTER — Ambulatory Visit: Attending: Surgery

## 2024-05-25 VITALS — Wt 193.1 lb

## 2024-05-25 DIAGNOSIS — Z483 Aftercare following surgery for neoplasm: Secondary | ICD-10-CM | POA: Insufficient documentation

## 2024-05-25 NOTE — Therapy (Signed)
 OUTPATIENT PHYSICAL THERAPY SOZO SCREENING NOTE   Patient Name: Tonya Vincent MRN: [id number] DOB:Jul 23, 1951, 73 y.o., female Today's Date: 05/25/2024  PCP: Benjamine Aland, MD REFERRING PROVIDER: Vernetta Berg, MD   PT End of Session - 05/25/24 [phone]     Visit Number 2   # unchnanged due to screen only   PT Start Time 0832    PT Stop Time 0835    PT Time Calculation (min) 3 min    Activity Tolerance Patient tolerated treatment well    Behavior During Therapy [facility] for tasks assessed/performed          Past Medical History:  Diagnosis Date   ADHD (attention deficit hyperactivity disorder)    Anemia    Anxiety    Arthritis    Breast cancer (HCC) 10/2022   left breast ILC   Cataract    Complication of anesthesia    difficulty waking up   Depression    Diabetes mellitus    type 2 DM   Dyspnea    with exertion   Heart murmur    History of radiation therapy    Left breast- 06/04/23-07/23/23-Dr. Lynwood Nasuti   History of rectal cancer 10/04/2011   Hypertension    Overactive bladder    Paraspinal mass 12/29/2014   Port-A-Cath in place 12/27/2022   Rectal cancer (HCC) dx'd 11/16/09   xrt comp 01/2010; chemo comp 01/2011   Tubular adenoma of colon 01/08/2014   Past Surgical History:  Procedure Laterality Date   ABDOMINAL HYSTERECTOMY     for uterine fibroids   BREAST LUMPECTOMY WITH RADIOACTIVE SEED LOCALIZATION Left 11/14/2022   Procedure: LEFT BREAST RADIOACTIVE SEED GUIDED LUMPECTOMY x2;  Surgeon: Vernetta Berg, MD;  Location: [facility];  Service: General;  Laterality: Left;   COLON SURGERY N/A 04/24/2010   took out half rectum; rectal cancer   EYE SURGERY     lasik surgery   HAND SURGERY     carpal tunnel- left   HEMORRHOID SURGERY N/A 1980   IR CV LINE INJECTION  03/22/2023   IR IMAGING GUIDED PORT INSERTION  04/09/2023   IR REMOVAL TUN ACCESS W/ PORT W/O FL MOD SED  04/09/2023   IR REMOVAL TUN ACCESS W/ PORT W/O FL MOD SED  12/25/2023    ORIF ANKLE FRACTURE Right 04/11/2020   Procedure: OPEN REDUCTION INTERNAL FIXATION (ORIF) ANKLE FRACTURE;  Surgeon: Margrette Taft BRAVO, MD;  Location: [facility];  Service: Orthopedics;  Laterality: Right;   PORT-A-CATH REMOVAL N/A 02/17/2019   Procedure: REMOVAL PORT-A-CATH;  Surgeon: Vernetta Berg, MD;  Location: [facility] OR;  Service: General;  Laterality: N/A;   PORTACATH PLACEMENT     2011   PORTACATH PLACEMENT N/A 12/17/2022   Procedure: INSERTION PORT-A-CATH;  Surgeon: Vernetta Berg, MD;  Location: [facility] OR;  Service: General;  Laterality: N/A;   RECTAL SURGERY     2010   SENTINEL NODE BIOPSY Left 11/14/2022   Procedure: SENTINEL LYMPH NODE BIOPSY;  Surgeon: Vernetta Berg, MD;  Location: [facility];  Service: General;  Laterality: Left;   WISDOM TOOTH EXTRACTION     Patient Active Problem List   Diagnosis Date Noted   Deep venous thrombosis (HCC) 02/28/2023   Malignant neoplasm of rectum (HCC) 01/18/2023   Genetic testing 11/12/2022   Malignant neoplasm of lower-outer quadrant of left breast of female, estrogen receptor positive (HCC) 10/30/2022   Malignant neoplasm of upper-outer quadrant of left breast in female, estrogen receptor positive (HCC) 10/23/2022  S/P ORIF (open reduction internal fixation) fracture right ankle 04/11/20 04/21/2020   Closed trimalleolar fracture of right ankle    History of colonic polyps 12/18/2019   Fecal smearing 12/18/2019   Left arm pain 02/04/2019   Rectal bleeding 02/06/2018   Microcytosis 01/01/2017   Essential hypertension 01/02/2016   Change in stool caliber 12/31/2014   Paraspinal mass 12/29/2014   Anemia in chronic illness 12/29/2014   History of rectal cancer 10/04/2011    REFERRING DIAG: left breast cancer at risk for lymphedema  THERAPY DIAG: Aftercare following surgery for neoplasm  PERTINENT HISTORY: Patient was diagnosed on 10/12/2022 with 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 It measures 4 mm  and 8 mm and is located in the upper outer quadrant. The smaller mass is triple positive with a Ki67 of 40%. The larger mass is ER/PR positive and HER2 negative with a Ki67 of 15% . Pt had left lumpectomy with SLNB on 11/14/2022. 1/8 LN's positive. She will be having chemotherapy with Herceptin  maintenance, radiation and anti estrogens. There is a deep seroma at the lumpectomy site identified by surgeon with no further treatment required at present per MD. Pt is a part time pharmacist   PRECAUTIONS: left UE Lymphedema risk, None  SUBJECTIVE: Pt returns for her 3 month L-Dex screen.   PAIN:  Are you having pain? No  SOZO SCREENING: Patient was assessed today using the SOZO machine to determine the lymphedema index score. This was compared to her baseline score. It was determined that she is within the recommended range when compared to her baseline and no further action is needed at this time. She will continue SOZO screenings. These are done every 3 months for 2 years post operatively followed by every 6 months for 2 years, and then annually.   L-DEX FLOWSHEETS - 05/25/24 0800       L-DEX LYMPHEDEMA SCREENING   Measurement Type Unilateral    L-DEX MEASUREMENT EXTREMITY Upper Extremity    POSITION  Standing    DOMINANT SIDE Right    At Risk Side Left    BASELINE SCORE (UNILATERAL) 2.5    L-DEX SCORE (UNILATERAL) -4.5    VALUE CHANGE (UNILAT) -7           Aden Berwyn Caldron, PTA 05/25/2024, 8:34 AM

## 2024-06-01 DIAGNOSIS — E7800 Pure hypercholesterolemia, unspecified: Secondary | ICD-10-CM | POA: Diagnosis not present

## 2024-06-01 DIAGNOSIS — Z6833 Body mass index (BMI) 33.0-33.9, adult: Secondary | ICD-10-CM | POA: Diagnosis not present

## 2024-06-01 DIAGNOSIS — I10 Essential (primary) hypertension: Secondary | ICD-10-CM | POA: Diagnosis not present

## 2024-06-01 DIAGNOSIS — F423 Hoarding disorder: Secondary | ICD-10-CM | POA: Diagnosis not present

## 2024-06-01 DIAGNOSIS — E1165 Type 2 diabetes mellitus with hyperglycemia: Secondary | ICD-10-CM | POA: Diagnosis not present

## 2024-06-01 DIAGNOSIS — F331 Major depressive disorder, recurrent, moderate: Secondary | ICD-10-CM | POA: Diagnosis not present

## 2024-06-01 DIAGNOSIS — F900 Attention-deficit hyperactivity disorder, predominantly inattentive type: Secondary | ICD-10-CM | POA: Diagnosis not present

## 2024-06-17 NOTE — Assessment & Plan Note (Signed)
 10/17/2022 Diffuse bilateral breast pains.  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  Mild hot flash and slight joint stiffness Echocardiogram 09/30/2023: EF 55 to 60%   Breast cancer surveillance:  11/15/2023: Mammogram: [facility]: Benign breast density category B 06/18/24: Breast exam: Benign   Return to clinic in 1 year for follow-up

## 2024-06-18 ENCOUNTER — Inpatient Hospital Stay: Payer: HMO | Attending: Hematology and Oncology | Admitting: Hematology and Oncology

## 2024-06-18 VITALS — BP 100/60 | HR 88 | Temp 98.0°F | Resp 18 | Ht 63.0 in | Wt 186.9 lb

## 2024-06-18 DIAGNOSIS — Z170 Estrogen receptor positive status [ER+]: Secondary | ICD-10-CM | POA: Insufficient documentation

## 2024-06-18 DIAGNOSIS — Z85048 Personal history of other malignant neoplasm of rectum, rectosigmoid junction, and anus: Secondary | ICD-10-CM | POA: Insufficient documentation

## 2024-06-18 DIAGNOSIS — Z1732 Human epidermal growth factor receptor 2 negative status: Secondary | ICD-10-CM | POA: Insufficient documentation

## 2024-06-18 DIAGNOSIS — Z79811 Long term (current) use of aromatase inhibitors: Secondary | ICD-10-CM | POA: Insufficient documentation

## 2024-06-18 DIAGNOSIS — C50412 Malignant neoplasm of upper-outer quadrant of left female breast: Secondary | ICD-10-CM | POA: Insufficient documentation

## 2024-06-18 DIAGNOSIS — Z1721 Progesterone receptor positive status: Secondary | ICD-10-CM | POA: Diagnosis not present

## 2024-06-18 DIAGNOSIS — Z79899 Other long term (current) drug therapy: Secondary | ICD-10-CM | POA: Diagnosis not present

## 2024-06-18 DIAGNOSIS — Z923 Personal history of irradiation: Secondary | ICD-10-CM | POA: Insufficient documentation

## 2024-06-18 NOTE — Progress Notes (Signed)
 Patient Care Team: Benjamine Aland, MD as PCP - General (Family Medicine) Lonn Hicks, MD as Consulting Physician (Hematology and Oncology) Odean Potts, MD as Consulting Physician (Hematology and Oncology) Vernetta Berg, MD as Consulting Physician (General Surgery) Shannon Agent, MD as Consulting Physician (Radiation Oncology)  DIAGNOSIS:  Encounter Diagnosis  Name Primary?   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 after neoadjuvant chemotherapy and radiation therapy   Primary site: Colon and Rectum   Staging method: AJCC 7th Edition   Clinical: Stage I (T2, N0, M0) signed by Hicks Lonn, MD on 12/28/2013  1:49 PM   Pathologic: Stage I (T2, N0, cM0) signed by Hicks Lonn, MD on 12/28/2013  1:49 PM   Summary: Stage I (T2, N0, cM0)     History of rectal cancer  11/17/2009 Procedure   Colonoscopy and rectal biopsy confirmed moderately differentiated adenocarcinoma   11/18/2009 Imaging   Transrectal ultrasound place staging T3 lesion   11/18/2009 Imaging   Staging CT scan of the chest, abdomen and pelvis showed multiple lesions in the liver as well as paraspinal mass of unknown etiology   12/05/2009 - 04/03/2010 Chemotherapy   The patient completed new adjuvant chemotherapy with 5-FU and radiation therapy   12/14/2009 Imaging   PET CT scan show faint metabolic activity in the paraspinal mass with no activity in the liver   04/24/2010 Surgery   She had surgical resection with negative margins. Final pathology was T2, N0, M0 (down staged by chemoradiation therapy from T3, N0, M0)   11/19/2012 Imaging   MRI of the liver confirmed benign hemangioma   01/08/2014 Procedure   Colonoscopy and biopsy was negative   12/21/2014 Imaging   CT scan showed no recurrence of colon cancer. Incidentally,  paraspinal mass is slightly larger.   10/04/2016 Procedure   She had repeat colonoscopy which showed two 3 mm polyps in the transverse colon, removed with a cold snare. Resected and retrieved. The examination was otherwise normal on direct and retroflexion views.   10/04/2016 Pathology Results   Surgical [P], transverse, polyp(s) - SERRATED POLYP WITH FOCAL FEATURES SUGGESTIVE OF SESSILE SERRATED POLYP/ADENOMA, ONE FRAGMENT. - HYPERPLASTIC POLYP WITHOUT DYSPLASIA, ONE FRAGMENT. - BENIGN COLORECTAL MUCOSA WITHOUT DYSPLASIA, ONE FRAGMENT. - SEE COMMENT. Microscopic Comment After routine specimen processing, three fragments are identified. One of the fragments demonstrates a serrated horizontally situated crypt, which is suggestive of superficial sampling of a sessile serrated polyp/adenoma (the differential includes another fragment of hyperplastic polyp). Although this is the case, the finding is very focal and not definitive.    11/09/2022 Genetic Testing   Negative genetics for [facility] Multi-Cancer +RNA Panel.  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 Report date is 11/09/2022.   The Multi-Cancer + RNA Panel offered by [facility] includes sequencing and/or deletion/duplication analysis of the following 70 genes:  AIP*, ALK, APC*, ATM*, AXIN2*, BAP1*, BARD1*, BLM*, BMPR1A*, BRCA1*, BRCA2*, BRIP1*, CDC73*, CDH1*, CDK4, CDKN1B*, CDKN2A, CHEK2*, CTNNA1*, DICER1*, EPCAM (del/dup only), EGFR, FH*, FLCN*, GREM1 (promoter dup only), HOXB13, KIT, LZTR1, MAX*, MBD4, MEN1*, MET, MITF, MLH1*, MSH2*, MSH3*, MSH6*, MUTYH*, NF1*, NF2*, NTHL1*, PALB2*, PDGFRA, PMS2*, POLD1*, POLE*, POT1*, PRKAR1A*, PTCH1*, PTEN*, RAD51C*, RAD51D*, RB1*, RET, SDHA* (sequencing only), SDHAF2*, SDHB*, SDHC*, SDHD*, SMAD4*, SMARCA4*, SMARCB1*, SMARCE1*, STK11*, SUFU*, TMEM127*, TP53*, TSC1*, TSC2*, VHL*. RNA analysis is performed for * genes.  11/09/2022 Genetic Testing   Negative genetics for [facility]  Multi-Cancer +RNA Panel.  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 Report date is 11/09/2022.   The Multi-Cancer + RNA Panel offered by [facility] includes sequencing and/or deletion/duplication analysis of the following 70 genes:  AIP*, ALK, APC*, ATM*, AXIN2*, BAP1*, BARD1*, BLM*, BMPR1A*, BRCA1*, BRCA2*, BRIP1*, CDC73*, CDH1*, CDK4, CDKN1B*, CDKN2A, CHEK2*, CTNNA1*, DICER1*, EPCAM (del/dup only), EGFR, FH*, FLCN*, GREM1 (promoter dup only), HOXB13, KIT, LZTR1, MAX*, MBD4, MEN1*, MET, MITF, MLH1*, MSH2*, MSH3*, MSH6*, MUTYH*, NF1*, NF2*, NTHL1*, PALB2*, PDGFRA, PMS2*, POLD1*, POLE*, POT1*, PRKAR1A*, PTCH1*, PTEN*, RAD51C*, RAD51D*, RB1*, RET, SDHA* (sequencing only), SDHAF2*, SDHB*, SDHC*, SDHD*, SMAD4*, SMARCA4*, SMARCB1*, SMARCE1*, STK11*, SUFU*, TMEM127*, TP53*, TSC1*, TSC2*, VHL*. RNA analysis is performed for * genes.    Malignant neoplasm of upper-outer quadrant of left breast in female, estrogen receptor positive (HCC)  10/17/2022 Initial Diagnosis   Diffuse bilateral breast pains.  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 Report date is 11/09/2022.   The Multi-Cancer + RNA Panel offered by [facility] includes sequencing and/or deletion/duplication analysis of the following 70 genes:  AIP*,  ALK, APC*, ATM*, AXIN2*, BAP1*, BARD1*, BLM*, BMPR1A*, BRCA1*, BRCA2*, BRIP1*, CDC73*, CDH1*, CDK4, CDKN1B*, CDKN2A, CHEK2*, CTNNA1*, DICER1*, EPCAM (del/dup only), EGFR, FH*, FLCN*, GREM1 (promoter dup only), HOXB13, KIT, LZTR1, MAX*, MBD4, MEN1*, MET, MITF, MLH1*, MSH2*, MSH3*, MSH6*, MUTYH*, NF1*, NF2*, NTHL1*, PALB2*, PDGFRA, PMS2*, POLD1*, POLE*, POT1*, PRKAR1A*, PTCH1*, PTEN*, RAD51C*, RAD51D*, RB1*, RET, SDHA* (sequencing only), SDHAF2*, SDHB*, SDHC*, SDHD*, SMAD4*, SMARCA4*, SMARCB1*, SMARCE1*, STK11*, SUFU*, TMEM127*, TP53*, TSC1*, TSC2*, VHL*. RNA analysis is performed for * genes.    11/14/2022 Surgery   Left lumpectomy: Grade 2 ILC with LCIS 3.5 cm, 1/8 lymph node with micrometastases ER 100%, PR 40%, Ki-67 20%, HER2 3+ positive; lymph node prognostic panel: ER 100% PR 20% Ki-67 10%, HER2 2+ by IHC, FISH negative ratio 1.14   06/04/2023 - 07/23/2023 Radiation Therapy   Plan Name: Breast_L_BH Site: Breast, Left Technique: 3D Mode: Photon Dose Per Fraction: 1.8 Gy Prescribed Dose (Delivered / Prescribed): 50.4 Gy / 50.4 Gy Prescribed Fxs (Delivered / Prescribed): 28 / 28   Plan Name: Brst_L_Scv_BH Site: Breast, Left Technique: 3D Mode: Photon Dose Per Fraction: 1.8 Gy Prescribed Dose (Delivered / Prescribed): 50.4 Gy / 50.4 Gy Prescribed Fxs (Delivered / Prescribed): 28 / 28   Plan Name: Brst_L_Bst_BH Site: Breast, Left Technique: 3D Mode: Photon Dose Per Fraction: 2 Gy Prescribed Dose (Delivered / Prescribed): 12 Gy / 12 Gy Prescribed Fxs (Delivered / Prescribed): 6 / 6   07/2023 -  Anti-estrogen oral therapy   Anastrozole    Malignant neoplasm of lower-outer quadrant of left breast of female, estrogen receptor positive (HCC)  10/30/2022 Initial Diagnosis   Malignant neoplasm of lower-outer quadrant of left breast of female, estrogen receptor positive (HCC)  12/27/2022 - 12/19/2023 Chemotherapy   Patient is on Treatment Plan : BREAST Docetaxel  + Carboplatin  + Trastuzumab   (TCH) q21d / Trastuzumab  q21d       CHIEF COMPLIANT: Surveillance of breast cancer  HISTORY OF PRESENT ILLNESS:   History of Present Illness Tonya Vincent is a 73 year old female with a history of breast cancer who presents for a follow-up visit.  She is taking anastrozole  without side effects and has refills available until next year. She discontinued Xarelto  in February 2025 after a year of use for a blood clot associated with a foreign body. She experiences occasional chest discomfort when swallowing, without shortness of breath or palpitations. Her last mammogram in December 2024 showed category B breast densities.     ALLERGIES:  is allergic to hydrocodone , norgesic [orphenadrine-aspirin-caffeine], oxycodone , and antibacterial hand soap [triclosan].  MEDICATIONS:  Current Outpatient Medications  Medication Sig Dispense Refill   amLODipine (NORVASC) 10 MG tablet Take 10 mg by mouth at bedtime.      anastrozole  (ARIMIDEX ) 1 MG tablet TAKE 1 TABLET BY MOUTH EVERY DAY 90 tablet 3   Aromatic Inhalants (VICKS VAPOINHALER IN) Inhale 1 puff into the lungs daily as needed (congestion).     atorvastatin (LIPITOR) 10 MG tablet Take 10 mg by mouth daily.     doxazosin (CARDURA) 2 MG tablet      empagliflozin (JARDIANCE) 25 MG TABS tablet Take 25 mg by mouth daily with supper.      ergocalciferol  (VITAMIN D2) 1.25 MG (50000 UT) capsule Take 1 capsule (50,000 Units total) by mouth once a week.     ibuprofen  (ADVIL ) 800 MG tablet Take 800 mg by mouth every 8 (eight) hours as needed for moderate pain.     latanoprost (XALATAN) 0.005 % ophthalmic solution Place 1 drop into both eyes at bedtime.      loperamide (IMODIUM A-D) 2 MG tablet Take 2 mg by mouth 4 (four) times daily as needed for diarrhea or loose stools.     olmesartan (BENICAR) 40 MG tablet Take 40 mg by mouth daily.     ondansetron  (ZOFRAN -ODT) 4 MG disintegrating tablet Take 1 tablet (4 mg total) by mouth every 8 (eight) hours as  needed for nausea or vomiting. 20 tablet 0   Semaglutide (OZEMPIC, 2 MG/DOSE, [place]) Inject 2 mg into the skin.     vortioxetine HBr (TRINTELLIX) 10 MG TABS tablet Take 10 mg by mouth daily with supper.     oxymetazoline  (AFRIN) 0.05 % nasal spray Place 1 spray into both nostrils 2 (two) times daily. (Patient not taking: Reported on 06/18/2024)     No current facility-administered medications for this visit.    PHYSICAL EXAMINATION: ECOG PERFORMANCE STATUS: 1 - Symptomatic but completely ambulatory  Vitals:   06/18/24 1000  BP: 100/60  Pulse: 88  Resp: 18  Temp: 98 F (36.7 C)  SpO2: 97%   Filed Weights   06/18/24 1000  Weight: 186 lb 14.4 oz (84.8 kg)    Physical Exam   (exam performed in the presence of a chaperone)  LABORATORY DATA:  I have reviewed the data as listed    Latest Ref Rng & Units 03/23/2024    2:24 PM 03/11/2024    2:15 PM 12/19/2023   11:38 AM  CMP  Glucose 70 - 99 mg/dL 891  800  894   BUN 6 - 23 mg/dL 14  18  14    Creatinine 0.40 - 1.20 mg/dL 8.65  8.64  8.79  Sodium 135 - 145 mEq/L 138  136  138   Potassium 3.5 - 5.1 mEq/L 4.5  3.7  4.4   Chloride 96 - 112 mEq/L 102  101  104   CO2 19 - 32 mEq/L 26  21  28    Calcium 8.4 - 10.5 mg/dL 9.7  9.0  9.6   Total Protein 6.0 - 8.3 g/dL 7.8  7.1  7.3   Total Bilirubin 0.2 - 1.2 mg/dL 0.3  0.4  0.4   Alkaline Phos 39 - 117 U/L 74  82  85   AST 0 - 37 U/L 13  21  14    ALT 0 - 35 U/L 16  19  19      Lab Results  Component Value Date   WBC 4.2 03/23/2024   HGB 11.9 (L) 03/23/2024   HCT 37.6 03/23/2024   MCV 84.4 03/23/2024   PLT 252.0 03/23/2024   NEUTROABS 2.3 03/23/2024    ASSESSMENT & PLAN:  Malignant neoplasm of upper-outer quadrant of left breast in female, estrogen receptor positive (HCC) 10/17/2022 Diffuse bilateral breast pains.  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  Mild hot flash and slight joint stiffness Echocardiogram 09/30/2023: EF 55 to 60%   Breast cancer surveillance:  11/15/2023: Mammogram: [facility]: Benign breast density category B 06/18/24: Breast exam: Benign   Return to clinic in 1 year for follow-up   ------------------------------------- Assessment and Plan Assessment & Plan Malignant neoplasm of upper-outer quadrant of left breast Breast cancer managed with anastrozole , well-tolerated. Recent mammogram showed less dense breast tissue, slightly lower cancer risk. - Continue anastrozole  until next year. - Schedule follow-up in one year.      No orders of the defined types were placed in this encounter.  The patient has a good understanding of the overall plan. she agrees with it. she will call with any problems that may develop before the next visit here. Total time spent: 30 mins including face to face time and time spent for planning, charting and  co-ordination of care   Viinay K Leontine Radman, MD 06/18/24

## 2024-07-20 ENCOUNTER — Other Ambulatory Visit: Payer: Self-pay | Admitting: Adult Health

## 2024-07-20 NOTE — Telephone Encounter (Signed)
 Dr. Gudena saw this patient last in July 2025. Dee discontinued prilosec. Please advise if refill needs to occur. Andrea CHRISTELLA Plunk, RN

## 2024-07-23 DIAGNOSIS — N951 Menopausal and female climacteric states: Secondary | ICD-10-CM | POA: Diagnosis not present

## 2024-07-23 DIAGNOSIS — C50412 Malignant neoplasm of upper-outer quadrant of left female breast: Secondary | ICD-10-CM | POA: Diagnosis not present

## 2024-07-23 DIAGNOSIS — I10 Essential (primary) hypertension: Secondary | ICD-10-CM | POA: Diagnosis not present

## 2024-07-23 DIAGNOSIS — E1169 Type 2 diabetes mellitus with other specified complication: Secondary | ICD-10-CM | POA: Diagnosis not present

## 2024-08-24 ENCOUNTER — Ambulatory Visit: Attending: Surgery

## 2024-08-24 VITALS — Wt 184.5 lb

## 2024-08-24 DIAGNOSIS — Z483 Aftercare following surgery for neoplasm: Secondary | ICD-10-CM | POA: Insufficient documentation

## 2024-08-24 NOTE — Therapy (Signed)
 OUTPATIENT PHYSICAL THERAPY SOZO SCREENING NOTE   Patient Name: Tonya Vincent MRN: [id number] DOB:15-Dec-1950, 73 y.o., female Today's Date: 08/24/2024  PCP: Benjamine Aland, MD REFERRING PROVIDER: Vernetta Berg, MD   PT End of Session - 08/24/24 1000     Visit Number 2   # unchanged due to screen only   PT Start Time 0958    PT Stop Time 1002    PT Time Calculation (min) 4 min    Activity Tolerance Patient tolerated treatment well    Behavior During Therapy [facility] for tasks assessed/performed          Past Medical History:  Diagnosis Date   ADHD (attention deficit hyperactivity disorder)    Anemia    Anxiety    Arthritis    Breast cancer (HCC) 10/2022   left breast ILC   Cataract    Complication of anesthesia    difficulty waking up   Depression    Diabetes mellitus    type 2 DM   Dyspnea    with exertion   Heart murmur    History of radiation therapy    Left breast- 06/04/23-07/23/23-Dr. Lynwood Nasuti   History of rectal cancer 10/04/2011   Hypertension    Overactive bladder    Paraspinal mass 12/29/2014   Port-A-Cath in place 12/27/2022   Rectal cancer (HCC) dx'd 11/16/09   xrt comp 01/2010; chemo comp 01/2011   Tubular adenoma of colon 01/08/2014   Past Surgical History:  Procedure Laterality Date   ABDOMINAL HYSTERECTOMY     for uterine fibroids   BREAST LUMPECTOMY WITH RADIOACTIVE SEED LOCALIZATION Left 11/14/2022   Procedure: LEFT BREAST RADIOACTIVE SEED GUIDED LUMPECTOMY x2;  Surgeon: Vernetta Berg, MD;  Location: [facility];  Service: General;  Laterality: Left;   COLON SURGERY N/A 04/24/2010   took out half rectum; rectal cancer   EYE SURGERY     lasik surgery   HAND SURGERY     carpal tunnel- left   HEMORRHOID SURGERY N/A 1980   IR CV LINE INJECTION  03/22/2023   IR IMAGING GUIDED PORT INSERTION  04/09/2023   IR REMOVAL TUN ACCESS W/ PORT W/O FL MOD SED  04/09/2023   IR REMOVAL TUN ACCESS W/ PORT W/O FL MOD SED  12/25/2023    ORIF ANKLE FRACTURE Right 04/11/2020   Procedure: OPEN REDUCTION INTERNAL FIXATION (ORIF) ANKLE FRACTURE;  Surgeon: Margrette Taft BRAVO, MD;  Location: [facility];  Service: Orthopedics;  Laterality: Right;   PORT-A-CATH REMOVAL N/A 02/17/2019   Procedure: REMOVAL PORT-A-CATH;  Surgeon: Vernetta Berg, MD;  Location: [facility] OR;  Service: General;  Laterality: N/A;   PORTACATH PLACEMENT     2011   PORTACATH PLACEMENT N/A 12/17/2022   Procedure: INSERTION PORT-A-CATH;  Surgeon: Vernetta Berg, MD;  Location: [facility];  Service: General;  Laterality: N/A;   RECTAL SURGERY     2010   SENTINEL NODE BIOPSY Left 11/14/2022   Procedure: SENTINEL LYMPH NODE BIOPSY;  Surgeon: Vernetta Berg, MD;  Location: [facility];  Service: General;  Laterality: Left;   WISDOM TOOTH EXTRACTION     Patient Active Problem List   Diagnosis Date Noted   Deep venous thrombosis (HCC) 02/28/2023   Malignant neoplasm of rectum (HCC) 01/18/2023   Genetic testing 11/12/2022   Malignant neoplasm of lower-outer quadrant of left breast of female, estrogen receptor positive (HCC) 10/30/2022   Malignant neoplasm of upper-outer quadrant of left breast in female, estrogen receptor positive (HCC) 10/23/2022  S/P ORIF (open reduction internal fixation) fracture right ankle 04/11/20 04/21/2020   Closed trimalleolar fracture of right ankle    History of colonic polyps 12/18/2019   Fecal smearing 12/18/2019   Left arm pain 02/04/2019   Rectal bleeding 02/06/2018   Microcytosis 01/01/2017   Essential hypertension 01/02/2016   Change in stool caliber 12/31/2014   Paraspinal mass 12/29/2014   Anemia in chronic illness 12/29/2014   History of rectal cancer 10/04/2011    REFERRING DIAG: left breast cancer at risk for lymphedema  THERAPY DIAG: Aftercare following surgery for neoplasm  PERTINENT HISTORY: Patient was diagnosed on 10/12/2022 with 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 It measures 4 mm and  8 mm and is located in the upper outer quadrant. The smaller mass is triple positive with a Ki67 of 40%. The larger mass is ER/PR positive and HER2 negative with a Ki67 of 15% . Pt had left lumpectomy with SLNB on 11/14/2022. 1/8 LN's positive. She will be having chemotherapy with Herceptin  maintenance, radiation and anti estrogens. There is a deep seroma at the lumpectomy site identified by surgeon with no further treatment required at present per MD. Pt is a part time pharmacist   PRECAUTIONS: left UE Lymphedema risk, None  SUBJECTIVE: Pt returns for her 3 month L-Dex screen.   PAIN:  Are you having pain? No  SOZO SCREENING: Patient was assessed today using the SOZO machine to determine the lymphedema index score. This was compared to her baseline score. It was determined that she is within the recommended range when compared to her baseline and no further action is needed at this time. She will continue SOZO screenings. These are done every 3 months for 2 years post operatively followed by every 6 months for 2 years, and then annually.   L-DEX FLOWSHEETS - 08/24/24 1000       L-DEX LYMPHEDEMA SCREENING   Measurement Type Unilateral    L-DEX MEASUREMENT EXTREMITY Upper Extremity    POSITION  Standing    DOMINANT SIDE Right    At Risk Side Left    BASELINE SCORE (UNILATERAL) 2.5    L-DEX SCORE (UNILATERAL) -0.3    VALUE CHANGE (UNILAT) -2.8         P: One more 3 month SOZO and then transition to 6 months.   Aden Berwyn Caldron, PTA 08/24/2024, 10:01 AM

## 2024-10-02 DIAGNOSIS — F331 Major depressive disorder, recurrent, moderate: Secondary | ICD-10-CM | POA: Diagnosis not present

## 2024-10-02 DIAGNOSIS — I10 Essential (primary) hypertension: Secondary | ICD-10-CM | POA: Diagnosis not present

## 2024-10-02 DIAGNOSIS — E1165 Type 2 diabetes mellitus with hyperglycemia: Secondary | ICD-10-CM | POA: Diagnosis not present

## 2024-10-02 DIAGNOSIS — L26 Exfoliative dermatitis: Secondary | ICD-10-CM | POA: Diagnosis not present

## 2024-10-02 DIAGNOSIS — F980 Enuresis not due to a substance or known physiological condition: Secondary | ICD-10-CM | POA: Diagnosis not present

## 2024-11-17 ENCOUNTER — Encounter: Payer: Self-pay | Admitting: Hematology and Oncology

## 2024-11-23 ENCOUNTER — Ambulatory Visit: Attending: Surgery

## 2024-11-23 VITALS — Wt 197.0 lb

## 2024-11-23 DIAGNOSIS — Z483 Aftercare following surgery for neoplasm: Secondary | ICD-10-CM | POA: Insufficient documentation

## 2024-11-23 NOTE — Therapy (Signed)
 " OUTPATIENT PHYSICAL THERAPY SOZO SCREENING NOTE   Patient Name: Tonya Vincent MRN: [id number] DOB:1951/11/08, 73 y.o., female Today's Date: 11/23/2024  PCP: Benjamine Aland, MD REFERRING PROVIDER: Benjamine Aland, MD   PT End of Session - 11/23/24 [phone]     Visit Number 2   # unchanged due to screen only   PT Start Time 0851    PT Stop Time 0855    PT Time Calculation (min) 4 min    Activity Tolerance Patient tolerated treatment well    Behavior During Therapy [facility] for tasks assessed/performed          Past Medical History:  Diagnosis Date   ADHD (attention deficit hyperactivity disorder)    Anemia    Anxiety    Arthritis    Breast cancer (HCC) 10/2022   left breast ILC   Cataract    Complication of anesthesia    difficulty waking up   Depression    Diabetes mellitus    type 2 DM   Dyspnea    with exertion   Heart murmur    History of radiation therapy    Left breast- 06/04/23-07/23/23-Dr. Lynwood Nasuti   History of rectal cancer 10/04/2011   Hypertension    Overactive bladder    Paraspinal mass 12/29/2014   Port-A-Cath in place 12/27/2022   Rectal cancer (HCC) dx'd 11/16/09   xrt comp 01/2010; chemo comp 01/2011   Tubular adenoma of colon 01/08/2014   Past Surgical History:  Procedure Laterality Date   ABDOMINAL HYSTERECTOMY     for uterine fibroids   BREAST LUMPECTOMY WITH RADIOACTIVE SEED LOCALIZATION Left 11/14/2022   Procedure: LEFT BREAST RADIOACTIVE SEED GUIDED LUMPECTOMY x2;  Surgeon: Vernetta Berg, MD;  Location: [facility];  Service: General;  Laterality: Left;   COLON SURGERY N/A 04/24/2010   took out half rectum; rectal cancer   EYE SURGERY     lasik surgery   HAND SURGERY     carpal tunnel- left   HEMORRHOID SURGERY N/A 1980   IR CV LINE INJECTION  03/22/2023   IR IMAGING GUIDED PORT INSERTION  04/09/2023   IR REMOVAL TUN ACCESS W/ PORT W/O FL MOD SED  04/09/2023   IR REMOVAL TUN ACCESS W/ PORT W/O FL MOD SED  12/25/2023    ORIF ANKLE FRACTURE Right 04/11/2020   Procedure: OPEN REDUCTION INTERNAL FIXATION (ORIF) ANKLE FRACTURE;  Surgeon: Margrette Taft BRAVO, MD;  Location: [facility];  Service: Orthopedics;  Laterality: Right;   PORT-A-CATH REMOVAL N/A 02/17/2019   Procedure: REMOVAL PORT-A-CATH;  Surgeon: Vernetta Berg, MD;  Location: [facility] OR;  Service: General;  Laterality: N/A;   PORTACATH PLACEMENT     2011   PORTACATH PLACEMENT N/A 12/17/2022   Procedure: INSERTION PORT-A-CATH;  Surgeon: Vernetta Berg, MD;  Location: [facility] OR;  Service: General;  Laterality: N/A;   RECTAL SURGERY     2010   SENTINEL NODE BIOPSY Left 11/14/2022   Procedure: SENTINEL LYMPH NODE BIOPSY;  Surgeon: Vernetta Berg, MD;  Location: [facility];  Service: General;  Laterality: Left;   WISDOM TOOTH EXTRACTION     Patient Active Problem List   Diagnosis Date Noted   Deep venous thrombosis (HCC) 02/28/2023   Malignant neoplasm of rectum (HCC) 01/18/2023   Genetic testing 11/12/2022   Malignant neoplasm of lower-outer quadrant of left breast of female, estrogen receptor positive (HCC) 10/30/2022   Malignant neoplasm of upper-outer quadrant of left breast in female, estrogen receptor positive (HCC) 10/23/2022  S/P ORIF (open reduction internal fixation) fracture right ankle 04/11/20 04/21/2020   Closed trimalleolar fracture of right ankle    History of colonic polyps 12/18/2019   Fecal smearing 12/18/2019   Left arm pain 02/04/2019   Rectal bleeding 02/06/2018   Microcytosis 01/01/2017   Essential hypertension 01/02/2016   Change in stool caliber 12/31/2014   Paraspinal mass 12/29/2014   Anemia in chronic illness 12/29/2014   History of rectal cancer 10/04/2011    REFERRING DIAG: left breast cancer at risk for lymphedema  THERAPY DIAG: Aftercare following surgery for neoplasm  PERTINENT HISTORY: Patient was diagnosed on 10/12/2022 with 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 It measures 4 mm and  8 mm and is located in the upper outer quadrant. The smaller mass is triple positive with a Ki67 of 40%. The larger mass is ER/PR positive and HER2 negative with a Ki67 of 15% . Pt had left lumpectomy with SLNB on 11/14/2022. 1/8 LN's positive. She will be having chemotherapy with Herceptin  maintenance, radiation and anti estrogens. There is a deep seroma at the lumpectomy site identified by surgeon with no further treatment required at present per MD. Pt is a part time pharmacist   PRECAUTIONS: left UE Lymphedema risk, None  SUBJECTIVE: Pt returns for her last 3 month L-Dex screen.   PAIN:  Are you having pain? No  SOZO SCREENING: Patient was assessed today using the SOZO machine to determine the lymphedema index score. This was compared to her baseline score. It was determined that she is within the recommended range when compared to her baseline and no further action is needed at this time. She will continue SOZO screenings. These are done every 3 months for 2 years post operatively followed by every 6 months for 2 years, and then annually.   L-DEX FLOWSHEETS - 11/23/24 0800       L-DEX LYMPHEDEMA SCREENING   Measurement Type Unilateral    L-DEX MEASUREMENT EXTREMITY Upper Extremity    POSITION  Standing    DOMINANT SIDE Right    At Risk Side Left    BASELINE SCORE (UNILATERAL) 2.5    L-DEX SCORE (UNILATERAL) -4.2    VALUE CHANGE (UNILAT) -6.7         P: Begin 6 month L-Dex screens now until 11/2026, then can transition to annual.   Aden Berwyn Caldron, PTA 11/23/2024, 8:56 AM    "

## 2025-01-07 ENCOUNTER — Other Ambulatory Visit: Payer: Self-pay | Admitting: Physician Assistant

## 2025-05-24 ENCOUNTER — Ambulatory Visit: Attending: Surgery

## 2025-06-21 ENCOUNTER — Ambulatory Visit: Admitting: Hematology and Oncology
